# Patient Record
Sex: Male | Born: 1935 | Race: White | Hispanic: No | Marital: Married | Smoking: Former smoker
Health system: Southern US, Community
[De-identification: ages and names within clinical notes are randomized; demographics above are authoritative.]

## PROBLEM LIST (undated history)

## (undated) DIAGNOSIS — Z96641 Presence of right artificial hip joint: Secondary | ICD-10-CM

## (undated) DIAGNOSIS — K5790 Diverticulosis of intestine, part unspecified, without perforation or abscess without bleeding: Secondary | ICD-10-CM

## (undated) DIAGNOSIS — Z87442 Personal history of urinary calculi: Secondary | ICD-10-CM

## (undated) DIAGNOSIS — F32A Depression, unspecified: Secondary | ICD-10-CM

## (undated) DIAGNOSIS — E785 Hyperlipidemia, unspecified: Secondary | ICD-10-CM

## (undated) DIAGNOSIS — K635 Polyp of colon: Secondary | ICD-10-CM

## (undated) DIAGNOSIS — M1990 Unspecified osteoarthritis, unspecified site: Secondary | ICD-10-CM

## (undated) DIAGNOSIS — I10 Essential (primary) hypertension: Secondary | ICD-10-CM

## (undated) DIAGNOSIS — I2510 Atherosclerotic heart disease of native coronary artery without angina pectoris: Secondary | ICD-10-CM

## (undated) DIAGNOSIS — Z8249 Family history of ischemic heart disease and other diseases of the circulatory system: Secondary | ICD-10-CM

## (undated) DIAGNOSIS — C801 Malignant (primary) neoplasm, unspecified: Secondary | ICD-10-CM

## (undated) DIAGNOSIS — G4733 Obstructive sleep apnea (adult) (pediatric): Secondary | ICD-10-CM

## (undated) DIAGNOSIS — F329 Major depressive disorder, single episode, unspecified: Secondary | ICD-10-CM

## (undated) DIAGNOSIS — R7303 Prediabetes: Secondary | ICD-10-CM

## (undated) DIAGNOSIS — IMO0002 Reserved for concepts with insufficient information to code with codable children: Secondary | ICD-10-CM

## (undated) DIAGNOSIS — I499 Cardiac arrhythmia, unspecified: Secondary | ICD-10-CM

## (undated) DIAGNOSIS — I4891 Unspecified atrial fibrillation: Secondary | ICD-10-CM

## (undated) DIAGNOSIS — S301XXA Contusion of abdominal wall, initial encounter: Secondary | ICD-10-CM

## (undated) HISTORY — DX: Major depressive disorder, single episode, unspecified: F32.9

## (undated) HISTORY — DX: Reserved for concepts with insufficient information to code with codable children: IMO0002

## (undated) HISTORY — PX: OTHER SURGICAL HISTORY: SHX169

## (undated) HISTORY — DX: Contusion of abdominal wall, initial encounter: S30.1XXA

## (undated) HISTORY — DX: Essential (primary) hypertension: I10

## (undated) HISTORY — DX: Depression, unspecified: F32.A

## (undated) HISTORY — PX: HERNIA REPAIR: SHX51

## (undated) HISTORY — DX: Presence of right artificial hip joint: Z96.641

## (undated) HISTORY — DX: Obstructive sleep apnea (adult) (pediatric): G47.33

## (undated) HISTORY — DX: Unspecified atrial fibrillation: I48.91

## (undated) HISTORY — DX: Hyperlipidemia, unspecified: E78.5

## (undated) HISTORY — DX: Diverticulosis of intestine, part unspecified, without perforation or abscess without bleeding: K57.90

## (undated) HISTORY — DX: Polyp of colon: K63.5

## (undated) HISTORY — DX: Prediabetes: R73.03

## (undated) HISTORY — DX: Atherosclerotic heart disease of native coronary artery without angina pectoris: I25.10

---
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
## 1978-10-15 HISTORY — PX: TRANSURETHRAL RESECTION OF PROSTATE: SHX73

## 1998-02-15 ENCOUNTER — Other Ambulatory Visit: Admission: RE | Admit: 1998-02-15 | Discharge: 1998-02-15 | Payer: Self-pay | Admitting: Internal Medicine

## 1998-05-03 ENCOUNTER — Other Ambulatory Visit: Admission: RE | Admit: 1998-05-03 | Discharge: 1998-05-03 | Payer: Self-pay | Admitting: Internal Medicine

## 2000-12-25 ENCOUNTER — Encounter: Payer: Self-pay | Admitting: Internal Medicine

## 2000-12-25 ENCOUNTER — Ambulatory Visit (HOSPITAL_COMMUNITY): Admission: RE | Admit: 2000-12-25 | Discharge: 2000-12-25 | Payer: Self-pay | Admitting: Internal Medicine

## 2002-05-12 ENCOUNTER — Ambulatory Visit (HOSPITAL_COMMUNITY): Admission: RE | Admit: 2002-05-12 | Discharge: 2002-05-12 | Payer: Self-pay | Admitting: Cardiovascular Disease

## 2005-03-01 ENCOUNTER — Ambulatory Visit (HOSPITAL_COMMUNITY): Admission: RE | Admit: 2005-03-01 | Discharge: 2005-03-01 | Payer: Self-pay | Admitting: Internal Medicine

## 2005-12-06 ENCOUNTER — Ambulatory Visit (HOSPITAL_COMMUNITY): Admission: RE | Admit: 2005-12-06 | Discharge: 2005-12-06 | Payer: Self-pay | Admitting: Internal Medicine

## 2006-03-05 ENCOUNTER — Encounter: Admission: RE | Admit: 2006-03-05 | Discharge: 2006-03-05 | Payer: Self-pay | Admitting: Internal Medicine

## 2006-12-13 ENCOUNTER — Ambulatory Visit: Payer: Self-pay

## 2009-02-21 ENCOUNTER — Ambulatory Visit (HOSPITAL_COMMUNITY): Admission: RE | Admit: 2009-02-21 | Discharge: 2009-02-21 | Payer: Self-pay | Admitting: Internal Medicine

## 2011-08-30 ENCOUNTER — Other Ambulatory Visit (HOSPITAL_COMMUNITY): Payer: Self-pay | Admitting: Internal Medicine

## 2011-08-30 ENCOUNTER — Ambulatory Visit (HOSPITAL_COMMUNITY)
Admission: RE | Admit: 2011-08-30 | Discharge: 2011-08-30 | Disposition: A | Discharge status: Home / Self Care | Payer: Medicare Other | Source: Ambulatory Visit | Attending: Internal Medicine | Admitting: Internal Medicine

## 2011-08-30 DIAGNOSIS — R05 Cough: Secondary | ICD-10-CM | POA: Insufficient documentation

## 2011-08-30 DIAGNOSIS — R059 Cough, unspecified: Secondary | ICD-10-CM

## 2011-08-30 DIAGNOSIS — Z0000 Encounter for general adult medical examination without abnormal findings: Secondary | ICD-10-CM | POA: Insufficient documentation

## 2012-06-30 ENCOUNTER — Ambulatory Visit (INDEPENDENT_AMBULATORY_CARE_PROVIDER_SITE_OTHER): Payer: Medicare Other | Admitting: Cardiovascular Disease

## 2012-06-30 ENCOUNTER — Encounter: Payer: Self-pay | Admitting: Cardiovascular Disease

## 2012-06-30 ENCOUNTER — Encounter: Payer: Self-pay | Admitting: *Deleted

## 2012-06-30 VITALS — BP 144/72 | HR 50 | Ht 70.0 in | Wt 184.0 lb

## 2012-06-30 DIAGNOSIS — IMO0002 Reserved for concepts with insufficient information to code with codable children: Secondary | ICD-10-CM

## 2012-06-30 DIAGNOSIS — E785 Hyperlipidemia, unspecified: Secondary | ICD-10-CM

## 2012-06-30 DIAGNOSIS — R7309 Other abnormal glucose: Secondary | ICD-10-CM | POA: Insufficient documentation

## 2012-06-30 DIAGNOSIS — G4733 Obstructive sleep apnea (adult) (pediatric): Secondary | ICD-10-CM | POA: Insufficient documentation

## 2012-06-30 DIAGNOSIS — E782 Mixed hyperlipidemia: Secondary | ICD-10-CM | POA: Insufficient documentation

## 2012-06-30 DIAGNOSIS — I4891 Unspecified atrial fibrillation: Secondary | ICD-10-CM

## 2012-06-30 DIAGNOSIS — Z7901 Long term (current) use of anticoagulants: Secondary | ICD-10-CM

## 2012-06-30 DIAGNOSIS — I10 Essential (primary) hypertension: Secondary | ICD-10-CM | POA: Insufficient documentation

## 2012-06-30 NOTE — Assessment & Plan Note (Signed)
Followed by primary  No bleeding issues  Discussed alternative drugs like pradaxa, eliquis and xarelto

## 2012-06-30 NOTE — Assessment & Plan Note (Signed)
Cholesterol is at goal.  Continue current dose of statin and diet Rx.  No myalgias or side effects.  F/U  LFT's in 6 months. No results found for this basename: LDLCALC             

## 2012-06-30 NOTE — Progress Notes (Signed)
Patient ID: [name] Yu, male   DOB: [birth year], 78 y.o.   MRN: [id number] 78 yo referred by primary to establish with cards.  Apparantly has been seen at [facility] most recently.  Multiple previous episodes of chest pain not thought to be cardiac.  Had normal cath in 2003.  Normal stress test in 2005 at [facility].  07/2006 had Afib w/u at [facility].  DCC and on cardizem Dose decreased due to bradycardia Another normal stress test in 2007.  Has HTN, elevated lipids and sleep apnea.  Cardizem recently D/C by Dr Valetta Fuller for bradycardia  Reviewed over 50 pages of records from [facility] and records from primary office   ROS: Denies fever, malais, weight loss, blurry vision, decreased visual acuity, cough, sputum, SOB, hemoptysis, pleuritic pain, palpitaitons, heartburn, abdominal pain, melena, lower extremity edema, claudication, or rash.  All other systems reviewed and negative   General: Affect appropriate Healthy:  appears stated age HEENT: normal Neck supple with no adenopathy JVP normal no bruits no thyromegaly Lungs clear with no wheezing and good diaphragmatic motion Heart:  S1/S2 no murmur,rub, gallop or click PMI normal Abdomen: benighn, BS positve, no tenderness, no AAA no bruit.  No HSM or HJR Distal pulses intact with no bruits No edema Neuro non-focal Skin warm and dry No muscular weakness  Medications Current Outpatient Prescriptions  Medication Sig Dispense Refill  . aspirin 81 MG tablet Take 81 mg by mouth daily.      . Calcium Carbonate Antacid (TUMS E-X PO) Take 2 tablets by mouth daily.      . Cholecalciferol 5000 UNITS TABS Take 1 tablet by mouth daily.      . citalopram (CELEXA) 20 MG tablet Take 20 mg by mouth daily.      Marland Kitchen diltiazem (CARDIZEM) 60 MG tablet Take 60 mg by mouth as needed.      . finasteride (PROSCAR) 5 MG tablet 1/2 tab po qd      . Magnesium 200 MG TABS Take 1 tablet by mouth daily.      . Multiple Vitamin (MULTIVITAMIN) capsule Take 1 capsule  by mouth daily.      . Omega-3 Fatty Acids (FISH OIL PO) Take 1 tablet by mouth daily.      . simvastatin (ZOCOR) 40 MG tablet Take 20 mg by mouth Daily.       . vitamin C (ASCORBIC ACID) 500 MG tablet Take 1,000 mg by mouth daily.      Marland Kitchen warfarin (COUMADIN) 5 MG tablet As directed      . zolpidem (AMBIEN) 10 MG tablet Take 1 tablet by mouth At bedtime.        Allergies Cardizem; Ciprofloxacin; and Novocain  Family History: Family History  Problem Relation Age of Onset  . Diabetes    . Alzheimer's disease    . Cancer    . Stroke    . Dementia      Social History: History   Social History  . Marital Status: Married    Spouse Name: N/A    Number of Children: N/A  . Years of Education: N/A   Occupational History  . Not on file.   Social History Main Topics  . Smoking status: Never Smoker   . Smokeless tobacco: Not on file  . Alcohol Use: No  . Drug Use: No  . Sexually Active: Not on file   Other Topics Concern  . Not on file   Social History Narrative  .  No narrative on file    Electrocardiogram:   NSR rate 50  LAD nonspecifc ST/T wave changes no change from ECG at [facility] 2008  Assessment and Plan

## 2012-06-30 NOTE — Patient Instructions (Signed)
Your physician wants you to follow-up in:   6 MONTHS WITH DR Mariah Milling IN [facility] OFFICE You will receive a reminder letter in the mail two months in advance. If you don't receive a letter please call our office to schedule the follow-up appointment. Your physician recommends that you continue on your current medications as directed. Please refer to the Current Medication list given to you today.

## 2012-06-30 NOTE — Assessment & Plan Note (Signed)
Avoid AV nodal blocking drugs.   Well controlled.  Continue current medications and low sodium Dash type diet.

## 2012-06-30 NOTE — Assessment & Plan Note (Signed)
Clearly has SSS  Discussed coumadin alternatives.  He is happy with coumadin for the time being.  Discussed possible future need for pacer and need for avoiding AV nodal blocking drugs.  Intervals on ECG otherwise ok with no AV block

## 2012-07-03 ENCOUNTER — Encounter: Payer: Self-pay | Admitting: Cardiovascular Disease

## 2012-11-29 ENCOUNTER — Other Ambulatory Visit: Payer: Self-pay

## 2012-12-05 ENCOUNTER — Encounter: Payer: Self-pay | Admitting: Gastroenterology

## 2012-12-29 ENCOUNTER — Ambulatory Visit (INDEPENDENT_AMBULATORY_CARE_PROVIDER_SITE_OTHER): Payer: Medicare Other | Admitting: Gastroenterology

## 2012-12-29 ENCOUNTER — Encounter: Payer: Self-pay | Admitting: Gastroenterology

## 2012-12-29 VITALS — BP 128/76 | HR 68 | Ht 70.0 in | Wt 189.0 lb

## 2012-12-29 DIAGNOSIS — Z860100 Personal history of colon polyps, unspecified: Secondary | ICD-10-CM | POA: Insufficient documentation

## 2012-12-29 NOTE — Assessment & Plan Note (Signed)
Risks and benefits of colonoscopy were discussed with the patient. I explained that we normally do not routinely perform screening colonoscopy in patients over 77 years of age. The patient's overall health status is excellent. I discussed options including no colonoscopy, colonoscopy while on Coumadin, and colonoscopy while holding Coumadin. The slightly higher risk for stroke while holding Coumadin was also discussed. The patient was somewhat indecisive. I suggested that he talk to his primary care physician to help him make a decision.

## 2012-12-29 NOTE — Progress Notes (Signed)
History of Present Illness: Pleasant 79 year old white male with history of adenomatous colon polyps, atrial fibrillation, on Coumadin, referred for consideration of colonoscopy. In 2003 several adenomatous and hyperplastic polyps were removed. The largest polyp measured 8 mm. He has no GI complaints including change of bowel habits, abdominal pain, melena or hematochezia. Because of a history of transient atrial fibrillation he was placed on Coumadin years ago.    Past Medical History  Diagnosis Date  . Hypertension   . Pre-diabetes   . Depression   . Dyslipidemia   . OSA (obstructive sleep apnea)   . Atrial fib/flutter, transient   . Colon polyps     2003 hyperplastic and tubuler adenoma  . Diverticulosis    Past Surgical History  Procedure Laterality Date  . Hernia repair     family history includes Alzheimer's disease in his mother; Breast cancer in his daughter and sister; Diabetes in his brother; Heart disease in his brother and sister; Kidney cancer in his father; Rectal cancer in his sister; and Ulcerative colitis in his sister. Current Outpatient Prescriptions  Medication Sig Dispense Refill  . aspirin 81 MG tablet Take 81 mg by mouth daily.      . Calcium Carbonate Antacid (TUMS E-X PO) Take 2 tablets by mouth daily.      . Cholecalciferol 5000 UNITS TABS Take 1 tablet by mouth daily.      . citalopram (CELEXA) 20 MG tablet Take 20 mg by mouth daily.      Marland Kitchen diltiazem (CARDIZEM) 60 MG tablet Take 60 mg by mouth as needed.      . finasteride (PROSCAR) 5 MG tablet 1/2 tab po qd      . Magnesium 200 MG TABS Take 1 tablet by mouth daily.      . Multiple Vitamin (MULTIVITAMIN) capsule Take 1 capsule by mouth daily.      . Omega-3 Fatty Acids (FISH OIL PO) Take 1 tablet by mouth daily.      . simvastatin (ZOCOR) 40 MG tablet Take 20 mg by mouth Daily.       . vitamin C (ASCORBIC ACID) 500 MG tablet Take 1,000 mg by mouth daily.      Marland Kitchen warfarin (COUMADIN) 5 MG tablet As directed       . zolpidem (AMBIEN) 10 MG tablet Take 1 tablet by mouth At bedtime.       No current facility-administered medications for this visit.   Allergies as of 12/29/2012 - Review Complete 12/29/2012  Allergen Reaction Noted  . Cardizem (diltiazem hcl)  06/30/2012  . Ciprofloxacin  06/30/2012  . Novocain (procaine hcl)  06/30/2012    reports that he has never smoked. He has never used smokeless tobacco. He reports that he does not drink alcohol or use illicit drugs.     Review of Systems: He has easy bruisability Pertinent positive and negative review of systems were noted in the above HPI section. All other review of systems were otherwise negative.  Vital signs were reviewed in today's medical record Physical Exam: General: Well developed , well nourished, no acute distress Skin: anicteric Head: Normocephalic and atraumatic Eyes:  sclerae anicteric, EOMI Ears: Normal auditory acuity Mouth: No deformity or lesions Neck: Supple, no masses or thyromegaly Lungs: Clear throughout to auscultation Heart: Regular rate and rhythm; no murmurs, rubs or bruits Abdomen: Soft, non tender and non distended. No masses, hepatosplenomegaly or hernias noted. Normal Bowel sounds Rectal:deferred Musculoskeletal: Symmetrical with no gross deformities  Skin: No lesions on  visible extremities Pulses:  Normal pulses noted Extremities: No clubbing, cyanosis, edema or deformities noted Neurological: Alert oriented x 4, grossly nonfocal Cervical Nodes:  No significant cervical adenopathy Inguinal Nodes: No significant inguinal adenopathy Psychological:  Alert and cooperative. Normal mood and affect

## 2012-12-29 NOTE — Patient Instructions (Addendum)
Follow up as needed

## 2013-01-08 ENCOUNTER — Encounter: Payer: Self-pay | Admitting: Gastroenterology

## 2013-02-10 ENCOUNTER — Encounter: Payer: Self-pay | Admitting: Cardiovascular Disease

## 2013-02-10 ENCOUNTER — Ambulatory Visit (INDEPENDENT_AMBULATORY_CARE_PROVIDER_SITE_OTHER): Payer: Medicare Other | Admitting: Cardiovascular Disease

## 2013-02-10 VITALS — BP 110/74 | HR 57 | Ht 71.0 in | Wt 187.5 lb

## 2013-02-10 DIAGNOSIS — E785 Hyperlipidemia, unspecified: Secondary | ICD-10-CM

## 2013-02-10 DIAGNOSIS — I4891 Unspecified atrial fibrillation: Secondary | ICD-10-CM

## 2013-02-10 DIAGNOSIS — I10 Essential (primary) hypertension: Secondary | ICD-10-CM

## 2013-02-10 NOTE — Assessment & Plan Note (Signed)
No recent episodes of atrial fibrillation. No symptoms concerning for arrhythmia. We will continue to take diltiazem when necessary as he has bradycardia when taking rate control medications on a regular basis.

## 2013-02-10 NOTE — Progress Notes (Signed)
Patient ID: [name] Yu, male    DOB: 20-Jul-1927, 79 y.o.   MRN: [id number]  HPI Comments: 79 yo gentleman with no known coronary artery disease, previously followed at [facility], history of HTN, elevated lipids and sleep apnea, bradycardia on calcium channel blockers and beta blockers, presents to establish care in the [place] office. Previously followed at the [facility]. History of atrial fibrillation in the past    Multiple previous episodes of chest pain not thought to be cardiac.  Had normal cath in 2003.  Normal stress test in 2005 at [facility].  07/2006 had Afib w/u at [facility].  DCC and on cardizem Dose decreased due to bradycardia Another normal stress test in 2007.  Some of his old records were reviewed, discussed with him.  He reports that overall he is doing well. He is active, balance is good. Reports good leg strength. No recent chest pain, shortness of breath.  EKG shows normal sinus rhythm with rate 57 beats per minute, T wave abnormality in V3 to V6, left axis deviation/left anterior fascicular block    Outpatient Encounter Prescriptions as of 02/10/2013  Medication Sig Dispense Refill  . aspirin 81 MG tablet Take 81 mg by mouth daily.      . Calcium Carbonate Antacid (TUMS E-X PO) Take 2 tablets by mouth daily.      . Cholecalciferol 5000 UNITS TABS Take 1 tablet by mouth daily.      . citalopram (CELEXA) 20 MG tablet Take 20 mg by mouth daily.      Marland Kitchen diltiazem (CARDIZEM) 60 MG tablet Take 60 mg by mouth as needed.      . finasteride (PROSCAR) 5 MG tablet 1/2 tab po qd      . LORazepam (ATIVAN) 2 MG tablet Take 2 mg by mouth as needed.       . Magnesium 200 MG TABS Take 1 tablet by mouth daily.      . Multiple Vitamin (MULTIVITAMIN) capsule Take 1 capsule by mouth daily.      . Omega-3 Fatty Acids (FISH OIL PO) Take 1 tablet by mouth daily.      . simvastatin (ZOCOR) 40 MG tablet Take 20 mg by mouth Daily.       . vitamin C (ASCORBIC ACID) 500 MG tablet Take 1,000 mg  by mouth daily.      Marland Kitchen warfarin (COUMADIN) 5 MG tablet As directed      . [DISCONTINUED] zolpidem (AMBIEN) 10 MG tablet Take 1 tablet by mouth At bedtime.       No facility-administered encounter medications on file as of 02/10/2013.     Review of Systems  Constitutional: Negative.   HENT: Negative.   Eyes: Negative.   Respiratory: Negative.   Cardiovascular: Negative.   Gastrointestinal: Negative.   Musculoskeletal: Negative.   Skin: Negative.   Neurological: Negative.   Psychiatric/Behavioral: Negative.   All other systems reviewed and are negative.    BP 110/74  Pulse 57  Ht 5\' 11"  (1.803 m)  Wt 187 lb 8 oz (85.049 kg)  BMI 26.16 kg/m2  Physical Exam  Nursing note and vitals reviewed. Constitutional: He is oriented to person, place, and time. He appears well-developed and well-nourished.  HENT:  Head: Normocephalic.  Nose: Nose normal.  Mouth/Throat: Oropharynx is clear and moist.  Eyes: Conjunctivae are normal. Pupils are equal, round, and reactive to light.  Neck: Normal range of motion. Neck supple. No JVD present.  Cardiovascular: Normal rate, regular rhythm,  S1 normal, S2 normal, normal heart sounds and intact distal pulses.  Exam reveals no gallop and no friction rub.   No murmur heard. Pulmonary/Chest: Effort normal and breath sounds normal. No respiratory distress. He has no wheezes. He has no rales. He exhibits no tenderness.  Abdominal: Soft. Bowel sounds are normal. He exhibits no distension. There is no tenderness.  Musculoskeletal: Normal range of motion. He exhibits no edema and no tenderness.  Lymphadenopathy:    He has no cervical adenopathy.  Neurological: He is alert and oriented to person, place, and time. Coordination normal.  Skin: Skin is warm and dry. No rash noted. No erythema.  Psychiatric: He has a normal mood and affect. His behavior is normal. Judgment and thought content normal.      Assessment and Plan

## 2013-02-10 NOTE — Assessment & Plan Note (Signed)
Cholesterol is at goal on the current lipid regimen. No changes to the medications were made.  

## 2013-02-10 NOTE — Assessment & Plan Note (Signed)
Blood pressure is well controlled on today's visit. No changes made to the medications. 

## 2013-02-10 NOTE — Patient Instructions (Addendum)
You are doing well. No medication changes were made.  Please call us if you have new issues that need to be addressed before your next appt.  Your physician wants you to follow-up in: 12 months.  You will receive a reminder letter in the mail two months in advance. If you don't receive a letter, please call our office to schedule the follow-up appointment. 

## 2013-04-28 ENCOUNTER — Other Ambulatory Visit (HOSPITAL_COMMUNITY): Payer: Self-pay | Admitting: Physician Assistant

## 2013-04-28 ENCOUNTER — Ambulatory Visit (HOSPITAL_COMMUNITY)
Admission: RE | Admit: 2013-04-28 | Discharge: 2013-04-28 | Disposition: A | Discharge status: Home / Self Care | Payer: Medicare Other | Source: Ambulatory Visit | Attending: Physician Assistant | Admitting: Physician Assistant

## 2013-04-28 DIAGNOSIS — J8410 Pulmonary fibrosis, unspecified: Secondary | ICD-10-CM | POA: Insufficient documentation

## 2013-04-28 DIAGNOSIS — Q254 Congenital malformation of aorta unspecified: Secondary | ICD-10-CM | POA: Insufficient documentation

## 2013-04-28 DIAGNOSIS — R0989 Other specified symptoms and signs involving the circulatory and respiratory systems: Secondary | ICD-10-CM | POA: Insufficient documentation

## 2013-04-28 DIAGNOSIS — R05 Cough: Secondary | ICD-10-CM | POA: Insufficient documentation

## 2013-04-28 DIAGNOSIS — R062 Wheezing: Secondary | ICD-10-CM | POA: Insufficient documentation

## 2013-04-28 DIAGNOSIS — J449 Chronic obstructive pulmonary disease, unspecified: Secondary | ICD-10-CM

## 2013-04-28 DIAGNOSIS — R059 Cough, unspecified: Secondary | ICD-10-CM | POA: Insufficient documentation

## 2013-04-28 DIAGNOSIS — R509 Fever, unspecified: Secondary | ICD-10-CM | POA: Insufficient documentation

## 2013-05-20 ENCOUNTER — Other Ambulatory Visit: Payer: Self-pay

## 2013-08-18 ENCOUNTER — Telehealth: Payer: Self-pay | Admitting: Physician Assistant

## 2013-08-20 ENCOUNTER — Other Ambulatory Visit: Payer: Self-pay

## 2013-09-01 ENCOUNTER — Encounter: Payer: Self-pay | Admitting: Physician Assistant

## 2013-09-01 ENCOUNTER — Ambulatory Visit: Payer: Medicare Other | Admitting: Physician Assistant

## 2013-09-01 VITALS — BP 112/62 | HR 76 | Temp 98.1°F | Resp 16 | Wt 191.0 lb

## 2013-09-01 DIAGNOSIS — I10 Essential (primary) hypertension: Secondary | ICD-10-CM

## 2013-09-01 DIAGNOSIS — E559 Vitamin D deficiency, unspecified: Secondary | ICD-10-CM

## 2013-09-01 DIAGNOSIS — E785 Hyperlipidemia, unspecified: Secondary | ICD-10-CM

## 2013-09-01 DIAGNOSIS — I4891 Unspecified atrial fibrillation: Secondary | ICD-10-CM

## 2013-09-01 DIAGNOSIS — R7303 Prediabetes: Secondary | ICD-10-CM

## 2013-09-01 LAB — CBC WITH DIFFERENTIAL/PLATELET
HCT: 46.6 % (ref 39.0–52.0)
Hemoglobin: 16.5 g/dL (ref 13.0–17.0)
Lymphocytes Relative: 29 % (ref 12–46)
MCHC: 35.4 g/dL (ref 30.0–36.0)
Monocytes Absolute: 0.7 10*3/uL (ref 0.1–1.0)
Monocytes Relative: 9 % (ref 3–12)
Neutro Abs: 4.2 10*3/uL (ref 1.7–7.7)
WBC: 7.1 10*3/uL (ref 4.0–10.5)

## 2013-09-01 LAB — HEMOGLOBIN A1C: Mean Plasma Glucose: 114 mg/dL (ref <117)

## 2013-09-01 NOTE — Patient Instructions (Signed)
Fat and Cholesterol Control Diet  Fat and cholesterol levels in your blood and organs are influenced by your diet. High levels of fat and cholesterol may lead to diseases of the heart, small and large blood vessels, gallbladder, liver, and pancreas.  CONTROLLING FAT AND CHOLESTEROL WITH DIET  Although exercise and lifestyle factors are important, your diet is key. That is because certain foods are known to raise cholesterol and others to lower it. The goal is to balance foods for their effect on cholesterol and more importantly, to replace saturated and trans fat with other types of fat, such as monounsaturated fat, polyunsaturated fat, and omega-3 fatty acids.  On average, a person should consume no more than 15 to 17 g of saturated fat daily. Saturated and trans fats are considered "bad" fats, and they will raise LDL cholesterol. Saturated fats are primarily found in animal products such as meats, butter, and cream. However, that does not mean you need to give up all your favorite foods. Today, there are good tasting, low-fat, low-cholesterol substitutes for most of the things you like to eat. Choose low-fat or nonfat alternatives. Choose round or loin cuts of red meat. These types of cuts are lowest in fat and cholesterol. Chicken (without the skin), fish, veal, and ground turkey breast are great choices. Eliminate fatty meats, such as hot dogs and salami. Even shellfish have little or no saturated fat. Have a 3 oz (85 g) portion when you eat lean meat, poultry, or fish.  Trans fats are also called "partially hydrogenated oils." They are oils that have been scientifically manipulated so that they are solid at room temperature resulting in a longer shelf life and improved taste and texture of foods in which they are added. Trans fats are found in stick margarine, some tub margarines, cookies, crackers, and baked goods.   When baking and cooking, oils are a great substitute for butter. The monounsaturated oils are  especially beneficial since it is believed they lower LDL and raise HDL. The oils you should avoid entirely are saturated tropical oils, such as coconut and palm.   Remember to eat a lot from food groups that are naturally free of saturated and trans fat, including fish, fruit, vegetables, beans, grains (barley, rice, couscous, bulgur wheat), and pasta (without cream sauces).   IDENTIFYING FOODS THAT LOWER FAT AND CHOLESTEROL   Soluble fiber may lower your cholesterol. This type of fiber is found in fruits such as apples, vegetables such as broccoli, potatoes, and carrots, legumes such as beans, peas, and lentils, and grains such as barley. Foods fortified with plant sterols (phytosterol) may also lower cholesterol. You should eat at least 2 g per day of these foods for a cholesterol lowering effect.   Read package labels to identify low-saturated fats, trans fat free, and low-fat foods at the supermarket. Select cheeses that have only 2 to 3 g saturated fat per ounce. Use a heart-healthy tub margarine that is free of trans fats or partially hydrogenated oil. When buying baked goods (cookies, crackers), avoid partially hydrogenated oils. Breads and muffins should be made from whole grains (whole-wheat or whole oat flour, instead of "flour" or "enriched flour"). Buy non-creamy canned soups with reduced salt and no added fats.   FOOD PREPARATION TECHNIQUES   Never deep-fry. If you must fry, either stir-fry, which uses very little fat, or use non-stick cooking sprays. When possible, broil, bake, or roast meats, and steam vegetables. Instead of putting butter or margarine on vegetables, use lemon   and herbs, applesauce, and cinnamon (for squash and sweet potatoes). Use nonfat yogurt, salsa, and low-fat dressings for salads.   LOW-SATURATED FAT / LOW-FAT FOOD SUBSTITUTES  Meats / Saturated Fat (g)  · Avoid: Steak, marbled (3 oz/85 g) / 11 g  · Choose: Steak, lean (3 oz/85 g) / 4 g  · Avoid: Hamburger (3 oz/85 g) / 7  g  · Choose: Hamburger, lean (3 oz/85 g) / 5 g  · Avoid: Ham (3 oz/85 g) / 6 g  · Choose: Ham, lean cut (3 oz/85 g) / 2.4 g  · Avoid: Chicken, with skin, dark meat (3 oz/85 g) / 4 g  · Choose: Chicken, skin removed, dark meat (3 oz/85 g) / 2 g  · Avoid: Chicken, with skin, light meat (3 oz/85 g) / 2.5 g  · Choose: Chicken, skin removed, light meat (3 oz/85 g) / 1 g  Dairy / Saturated Fat (g)  · Avoid: Whole milk (1 cup) / 5 g  · Choose: Low-fat milk, 2% (1 cup) / 3 g  · Choose: Low-fat milk, 1% (1 cup) / 1.5 g  · Choose: Skim milk (1 cup) / 0.3 g  · Avoid: Hard cheese (1 oz/28 g) / 6 g  · Choose: Skim milk cheese (1 oz/28 g) / 2 to 3 g  · Avoid: Cottage cheese, 4% fat (1 cup) / 6.5 g  · Choose: Low-fat cottage cheese, 1% fat (1 cup) / 1.5 g  · Avoid: Ice cream (1 cup) / 9 g  · Choose: Sherbet (1 cup) / 2.5 g  · Choose: Nonfat frozen yogurt (1 cup) / 0.3 g  · Choose: Frozen fruit bar / trace  · Avoid: Whipped cream (1 tbs) / 3.5 g  · Choose: Nondairy whipped topping (1 tbs) / 1 g  Condiments / Saturated Fat (g)  · Avoid: Mayonnaise (1 tbs) / 2 g  · Choose: Low-fat mayonnaise (1 tbs) / 1 g  · Avoid: Butter (1 tbs) / 7 g  · Choose: Extra light margarine (1 tbs) / 1 g  · Avoid: Coconut oil (1 tbs) / 11.8 g  · Choose: Olive oil (1 tbs) / 1.8 g  · Choose: Corn oil (1 tbs) / 1.7 g  · Choose: Safflower oil (1 tbs) / 1.2 g  · Choose: Sunflower oil (1 tbs) / 1.4 g  · Choose: Soybean oil (1 tbs) / 2.4 g  · Choose: Canola oil (1 tbs) / 1 g  Document Released: 10/01/2005 Document Revised: 01/26/2013 Document Reviewed: 03/22/2011  [employer]® Patient Information ©2014 [employer].

## 2013-09-01 NOTE — Progress Notes (Signed)
HPI Patient presents for 3 month follow up with hypertension, hyperlipidemia, prediabetes and vitamin D. Patient's blood pressure has been controlled at home. Patient denies chest pain, shortness of breath, dizziness.  Patient's cholesterol is diet controlled. In addition they are on zocor  and denies myalgias. The cholesterol last visit was HDL 53, trigs 157 . The patient has been working on diet and exercise, patient walks outside and repairs house, for prediabetes, and denies changes in vision, polys, and paresthesias.  A1C 5.8 Patient is on Vitamin D supplement. Level 67 Patient is also on Coumadin for afib, last visit is was 1.3 so the patient was told to do Coumadin 5 mg 3 days a week and 2.5 mg 4 days a week.  Current Medications:  Current Outpatient Prescriptions on File Prior to Visit  Medication Sig Dispense Refill  . aspirin 81 MG tablet Take 81 mg by mouth daily.      . Calcium Carbonate Antacid (TUMS E-X PO) Take 2 tablets by mouth daily.      . Cholecalciferol 5000 UNITS TABS Take 1 tablet by mouth daily.      . citalopram (CELEXA) 20 MG tablet Take 20 mg by mouth daily.      Marland Kitchen diltiazem (CARDIZEM) 60 MG tablet Take 60 mg by mouth as needed.      . finasteride (PROSCAR) 5 MG tablet 1/2 tab po qd      . LORazepam (ATIVAN) 2 MG tablet Take 2 mg by mouth as needed.       . Magnesium 200 MG TABS Take 1 tablet by mouth daily.      . Multiple Vitamin (MULTIVITAMIN) capsule Take 1 capsule by mouth daily.      . Omega-3 Fatty Acids (FISH OIL PO) Take 1 tablet by mouth daily.      . simvastatin (ZOCOR) 40 MG tablet Take 20 mg by mouth Daily.       . vitamin C (ASCORBIC ACID) 500 MG tablet Take 1,000 mg by mouth daily.      Marland Kitchen warfarin (COUMADIN) 5 MG tablet As directed       No current facility-administered medications on file prior to visit.   Medical History:  Past Medical History  Diagnosis Date  . Hypertension   . Pre-diabetes   . Depression   . Dyslipidemia   . OSA  (obstructive sleep apnea)   . Atrial fib/flutter, transient   . Colon polyps     2003 hyperplastic and tubuler adenoma  . Diverticulosis    Allergies:  Allergies  Allergen Reactions  . Cardizem [Diltiazem Hcl]     High-dose (braydycardia0  . Ciprofloxacin     dysphoria  . Novocain [Procaine Hcl]     ROS Constitutional: Denies fever, chills, weight loss/gain, headaches, insomnia, fatigue, night sweats, and change in appetite. Eyes: Denies redness, blurred vision, diplopia, discharge, itchy, watery eyes.  ENT: Denies discharge, congestion, post nasal drip, sore throat, earache, dental pain, Tinnitus, Vertigo, Sinus pain, snoring.  Cardio: Denies chest pain, palpitations, irregular heartbeat,  dyspnea, diaphoresis, orthopnea, PND, claudication, edema Respiratory: denies cough, dyspnea,pleurisy, hoarseness, wheezing.  Gastrointestinal: Denies dysphagia, heartburn,  water brash, pain, cramps, nausea, vomiting, bloating, diarrhea, constipation, hematemesis, melena, hematochezia,  hemorrhoids Genitourinary: Denies dysuria, frequency, urgency, nocturia, hesitancy, discharge, hematuria, flank pain Musculoskeletal: Denies arthralgia, myalgia, stiffness, Jt. Swelling, pain, limp, and strain/sprain. Skin: Denies pruritis, rash, hives, warts, acne, eczema, changing in skin lesion Neuro: Weakness, tremor, incoordination, spasms, paresthesia, pain Psychiatric: Denies confusion, memory loss, sensory loss  Endocrine: Denies change in weight, skin, hair change, nocturia, and paresthesia, Diabetic Polys, visual blurring, hyper /hypo glycemic episodes.  Heme/Lymph: Excessive bleeding, bruising, enlarged lymph nodes  Family history- Review and unchanged Social history- Review and unchanged Physical Exam: Filed Vitals:   09/01/13 1513  BP: 112/62  Pulse: 76  Temp: 98.1 F (36.7 C)  Resp: 16   Filed Weights   09/01/13 1513  Weight: 191 lb (86.637 kg)   General Appearance: Well nourished, in no  apparent distress. Eyes: PERRLA, EOMs, conjunctiva no swelling or erythema, normal fundi and vessels. Sinuses: No Frontal/maxillary tenderness ENT/Mouth: Ext aud canals clear, with TMs without erythema, bulging.No erythema, swelling, or exudate on post pharynx.  Tonsils not swollen or erythematous. Hearing normal.  Neck: Supple, thyroid normal.  Respiratory: Respiratory effort normal, BS equal bilaterally without rales, rhonchi, wheezing or stridor.  Cardio: Heart sounds normal, regular rate and rhythm without murmurs, rubs or gallops. Peripheral pulses brisk and equal bilaterally, without edema.  Abdomen: Flat, soft, with bowel sounds. Non tender, no guarding, rebound, hernias, masses, or organomegaly.  Lymphatics: Non tender without lymphadenopathy.  Musculoskeletal: Full ROM all peripheral extremities, joint stability, 5/5 strength, and normal gait. Skin: Warm, dry without rashes, lesions, ecchymosis.  Neuro: Cranial nerves intact, reflexes equal bilaterally. Normal muscle tone, no cerebellar symptoms. Sensation intact.  Psych: Awake and oriented X 3, normal affect, Insight and Judgment appropriate.   Assessment and Plan:  Hypertension: Continue medication, monitor blood pressure at home. Continue DASH diet. Cholesterol: Continue diet and exercise. Check cholesterol.  Pre-diabetes-Continue diet and exercise. Check A1C Vitamin D Def- check level and continue medications.  Afib- check INR  Quentin Mulling 3:36 PM

## 2013-09-02 LAB — BASIC METABOLIC PANEL WITH GFR
BUN: 15 mg/dL (ref 6–23)
Chloride: 99 mEq/L (ref 96–112)
Glucose, Bld: 93 mg/dL (ref 70–99)
Potassium: 4 mEq/L (ref 3.5–5.3)

## 2013-09-02 LAB — TSH: TSH: 1.439 u[IU]/mL (ref 0.350–4.500)

## 2013-09-02 LAB — HEPATIC FUNCTION PANEL
ALT: 14 U/L (ref 0–53)
AST: 19 U/L (ref 0–37)
Alkaline Phosphatase: 47 U/L (ref 39–117)
Indirect Bilirubin: 0.4 mg/dL (ref 0.0–0.9)
Total Protein: 6.4 g/dL (ref 6.0–8.3)

## 2013-09-02 LAB — LIPID PANEL: LDL Cholesterol: 76 mg/dL (ref 0–99)

## 2013-09-02 LAB — VITAMIN D 25 HYDROXY (VIT D DEFICIENCY, FRACTURES): Vit D, 25-Hydroxy: 73 ng/mL (ref 30–89)

## 2013-09-14 ENCOUNTER — Encounter: Payer: Self-pay | Admitting: Emergency Medicine

## 2013-09-14 ENCOUNTER — Ambulatory Visit (INDEPENDENT_AMBULATORY_CARE_PROVIDER_SITE_OTHER): Payer: Medicare Other | Admitting: Emergency Medicine

## 2013-09-14 VITALS — BP 122/76 | HR 64 | Temp 98.6°F | Resp 16 | Ht 70.0 in | Wt 192.0 lb

## 2013-09-14 DIAGNOSIS — J309 Allergic rhinitis, unspecified: Secondary | ICD-10-CM

## 2013-09-14 DIAGNOSIS — R05 Cough: Secondary | ICD-10-CM

## 2013-09-14 DIAGNOSIS — R059 Cough, unspecified: Secondary | ICD-10-CM

## 2013-09-14 DIAGNOSIS — J209 Acute bronchitis, unspecified: Secondary | ICD-10-CM

## 2013-09-14 MED ORDER — METHYLPREDNISOLONE (PAK) 4 MG PO TABS: 4.0000 mg | ORAL_TABLET | Freq: Every day | ORAL | Status: DC

## 2013-09-14 MED ORDER — BENZONATATE 100 MG PO CAPS: 100.0000 mg | ORAL_CAPSULE | Freq: Three times a day (TID) | ORAL | Status: DC | PRN

## 2013-09-14 MED ORDER — AZITHROMYCIN 250 MG PO TABS: ORAL_TABLET | ORAL | Status: AC

## 2013-09-14 MED ORDER — ALBUTEROL SULFATE HFA 108 (90 BASE) MCG/ACT IN AERS: 2.0000 | INHALATION_SPRAY | Freq: Four times a day (QID) | RESPIRATORY_TRACT | Status: DC | PRN

## 2013-09-14 NOTE — Patient Instructions (Signed)
Pneumonia, Adult Pneumonia is an infection of the lungs. It may be caused by a germ (virus or bacteria). Some types of pneumonia can spread easily from person to person. This can happen when you cough or sneeze. HOME CARE  Only take medicine as told by your doctor.  Take your medicine (antibiotics) as told. Finish it even if you start to feel better.  Do not smoke.  You may use a vaporizer or humidifier in your room. This can help loosen thick spit (mucus).  Sleep so you are almost sitting up (semi-upright). This helps reduce coughing.  Rest. A shot (vaccine) can help prevent pneumonia. Shots are often advised for:  People over 11 years old.  Patients on chemotherapy.  People with long-term (chronic) lung problems.  People with immune system problems. GET HELP RIGHT AWAY IF:   You are getting worse.  You cannot control your cough, and you are losing sleep.  You cough up blood.  Your pain gets worse, even with medicine.  You have a fever.  Any of your problems are getting worse, not better.  You have shortness of breath or chest pain. MAKE SURE YOU:   Understand these instructions.  Will watch your condition.  Will get help right away if you are not doing well or get worse. Document Released: 03/19/2008 Document Revised: 12/24/2011 Document Reviewed: 12/22/2010 [facility] Patient Information 2014 [place], [place]. Bronchitis Bronchitis is a problem of the air tubes leading to your lungs. This problem makes it hard for air to get in and out of the lungs. You may cough a lot because your air tubes are narrow. Going without care can cause lasting (chronic) bronchitis. HOME CARE   Drink enough fluids to keep your pee (urine) clear or pale yellow.  Use a cool mist humidifier.  Quit smoking if you smoke. If you keep smoking, the bronchitis might not get better.  Only take medicine as told by your doctor. GET HELP RIGHT AWAY IF:   Coughing keeps you awake.  You  start to wheeze.  You become more sick or weak.  You have a hard time breathing or get short of breath.  You cough up blood.  Coughing lasts more than 2 weeks.  You have a fever.  Your baby is older than 3 months with a rectal temperature of 102 F (38.9 C) or higher.  Your baby is 62 months old or younger with a rectal temperature of 100.4 F (38 C) or higher. MAKE SURE YOU:  Understand these instructions.  Will watch your condition.  Will get help right away if you are not doing well or get worse. Document Released: 03/19/2008 Document Revised: 12/24/2011 Document Reviewed: 05/26/2013 [facility] Patient Information 2014 [place], [place].

## 2013-09-14 NOTE — Progress Notes (Addendum)
Subjective:    Patient ID: [name] Yu, male    DOB: [birth year], 79 y.o.   MRN: [id number]  HPI Comments: 79 yo  male presents for increased yellow productive cough, hoarseness, chills and fatigue. He has been taking allegra and delsym OTC without relief. He feels mild tightness/ SOB with spasmatic coughing only, occasional feels like wheezing.   Cough Associated symptoms include chills and a fever.  Sore Throat  Associated symptoms include coughing.    Current Outpatient Prescriptions on File Prior to Visit  Medication Sig Dispense Refill  . aspirin 81 MG tablet Take 81 mg by mouth daily.      . Calcium Carbonate Antacid (TUMS E-X PO) Take 2 tablets by mouth daily.      . Cholecalciferol 5000 UNITS TABS Take 1 tablet by mouth daily.      . citalopram (CELEXA) 20 MG tablet Take 20 mg by mouth daily.      Marland Kitchen diltiazem (CARDIZEM) 60 MG tablet Take 60 mg by mouth as needed.      . finasteride (PROSCAR) 5 MG tablet 1/2 tab po qd      . LORazepam (ATIVAN) 2 MG tablet Take 2 mg by mouth as needed.       . Magnesium 200 MG TABS Take 1 tablet by mouth daily.      . Multiple Vitamin (MULTIVITAMIN) capsule Take 1 capsule by mouth daily.      . Omega-3 Fatty Acids (FISH OIL PO) Take 1 tablet by mouth daily.      . simvastatin (ZOCOR) 40 MG tablet Take 20 mg by mouth Daily.       . vitamin C (ASCORBIC ACID) 500 MG tablet Take 1,000 mg by mouth daily.      Marland Kitchen warfarin (COUMADIN) 5 MG tablet As directed       No current facility-administered medications on file prior to visit.   ALLERGIES Ciprofloxacin; Novocain; and Prednisone   Past Medical History  Diagnosis Date  . Hypertension   . Pre-diabetes   . Depression   . Dyslipidemia   . OSA (obstructive sleep apnea)   . Atrial fib/flutter, transient   . Colon polyps     2003 hyperplastic and tubuler adenoma  . Diverticulosis     Review of Systems  Constitutional: Positive for fever, chills and fatigue.  Respiratory: Positive for  cough.   All other systems reviewed and are negative.   BP 122/76  Pulse 64  Temp(Src) 98.6 F (37 C) (Temporal)  Resp 16  Ht 5\' 10"  (1.778 m)  Wt 192 lb (87.091 kg)  BMI 27.55 kg/m2     Objective:   Physical Exam  Nursing note and vitals reviewed. Constitutional: He is oriented to person, place, and time. He appears well-developed and well-nourished.  HENT:  Head: Normocephalic and atraumatic.  Right Ear: External ear normal.  Left Ear: External ear normal.  Nose: Nose normal.  Mouth/Throat: Oropharynx is clear and moist. No oropharyngeal exudate.  Eyes: Pupils are equal, round, and reactive to light.  Neck: Normal range of motion. Neck supple. No thyromegaly present.  Cardiovascular: Normal rate, regular rhythm, normal heart sounds and intact distal pulses.   Pulmonary/Chest: Effort normal. He has wheezes.  Mild rhonchi Right  Abdominal: Soft.  Musculoskeletal: Normal range of motion.  Lymphadenopathy:    He has no cervical adenopathy.  Neurological: He is alert and oriented to person, place, and time.  Skin: Skin is warm and dry.  Psychiatric: He has a  normal mood and affect. Judgment normal.          Assessment & Plan:  Cough, bronchitis, Allergic Rhinitis vs early pneumonia- If any SX increase ER. Start ZPAK, Medrol DP, Tessalon Perles, Albuterol inhaler AD. Continue Allegra and Delsym AD. Increase H2O. Decrease Coumadin to 1/2 QD while on Antibiotic w/c with any SE.

## 2013-10-01 ENCOUNTER — Other Ambulatory Visit: Payer: Self-pay | Admitting: Physician Assistant

## 2013-10-08 LAB — PROTIME-INR: Prothrombin Time: 18.9 s — ABNORMAL HIGH (ref 9.1–12.0)

## 2013-10-30 ENCOUNTER — Other Ambulatory Visit: Payer: Self-pay | Admitting: Physician Assistant

## 2013-11-10 NOTE — Telephone Encounter (Signed)
DONE

## 2013-11-18 ENCOUNTER — Ambulatory Visit (INDEPENDENT_AMBULATORY_CARE_PROVIDER_SITE_OTHER): Payer: Medicare Other | Admitting: Physician Assistant

## 2013-11-18 ENCOUNTER — Encounter: Payer: Self-pay | Admitting: Physician Assistant

## 2013-11-18 VITALS — BP 110/70 | HR 64 | Temp 98.1°F | Resp 16 | Wt 192.0 lb

## 2013-11-18 DIAGNOSIS — J209 Acute bronchitis, unspecified: Secondary | ICD-10-CM

## 2013-11-18 DIAGNOSIS — I4891 Unspecified atrial fibrillation: Secondary | ICD-10-CM

## 2013-11-18 LAB — CBC WITH DIFFERENTIAL/PLATELET
BASOS ABS: 0 10*3/uL (ref 0.0–0.1)
BASOS PCT: 0 % (ref 0–1)
EOS ABS: 0.5 10*3/uL (ref 0.0–0.7)
Eosinophils Relative: 7 % — ABNORMAL HIGH (ref 0–5)
HEMATOCRIT: 44.8 % (ref 39.0–52.0)
HEMOGLOBIN: 15.7 g/dL (ref 13.0–17.0)
Lymphocytes Relative: 29 % (ref 12–46)
Lymphs Abs: 1.9 10*3/uL (ref 0.7–4.0)
MCH: 32 pg (ref 26.0–34.0)
MCHC: 35 g/dL (ref 30.0–36.0)
MCV: 91.4 fL (ref 78.0–100.0)
MONO ABS: 0.6 10*3/uL (ref 0.1–1.0)
MONOS PCT: 9 % (ref 3–12)
NEUTROS ABS: 3.6 10*3/uL (ref 1.7–7.7)
NEUTROS PCT: 55 % (ref 43–77)
Platelets: 206 10*3/uL (ref 150–400)
RBC: 4.9 MIL/uL (ref 4.22–5.81)
RDW: 13.8 % (ref 11.5–15.5)
WBC: 6.5 10*3/uL (ref 4.0–10.5)

## 2013-11-18 LAB — HEPATIC FUNCTION PANEL
ALBUMIN: 4.1 g/dL (ref 3.5–5.2)
ALT: 13 U/L (ref 0–53)
AST: 19 U/L (ref 0–37)
Alkaline Phosphatase: 51 U/L (ref 39–117)
Bilirubin, Direct: 0.1 mg/dL (ref 0.0–0.3)
Indirect Bilirubin: 0.4 mg/dL (ref 0.2–1.2)
TOTAL PROTEIN: 6.1 g/dL (ref 6.0–8.3)
Total Bilirubin: 0.5 mg/dL (ref 0.2–1.2)

## 2013-11-18 LAB — BASIC METABOLIC PANEL WITH GFR
BUN: 13 mg/dL (ref 6–23)
CALCIUM: 9.1 mg/dL (ref 8.4–10.5)
CO2: 29 mEq/L (ref 19–32)
Chloride: 102 mEq/L (ref 96–112)
Creat: 0.78 mg/dL (ref 0.50–1.35)
GFR, EST NON AFRICAN AMERICAN: 83 mL/min
GLUCOSE: 98 mg/dL (ref 70–99)
Potassium: 5 mEq/L (ref 3.5–5.3)
Sodium: 139 mEq/L (ref 135–145)

## 2013-11-18 MED ORDER — PREDNISONE 20 MG PO TABS: ORAL_TABLET | ORAL | Status: DC

## 2013-11-18 MED ORDER — AZITHROMYCIN 250 MG PO TABS: ORAL_TABLET | ORAL | Status: AC

## 2013-11-18 NOTE — Patient Instructions (Signed)
Your goal for your INR is between 2-3.  If you number is below 2 than your blood is thick and you need more coumadin. You are at risk for stroke or clotting during this time period.  If you number is above 3 than your blood is too thin and you need less coumadin or can eat more greens at this time. You are at risk for stomach or head bleeds and need to be careful.   While you are on the antibiotic please decrease your coumadin to 1/2 pill daily and when you get off of the antibiotic go back to your normal dose.   The majority of colds are caused by viruses and do not require antibiotics. Please read the rest of this hand out to learn more about the common cold and what you can do to help yourself as well as help prevent the over use of antibiotics.   COMMON COLD SIGNS AND SYMPTOMS - The common cold usually causes nasal congestion, runny nose, and sneezing. A sore throat may be present on the first day but usually resolves quickly. If a cough occurs, it generally develops on about the fourth or fifth day of symptoms, typically when congestion and runny nose are resolving  COMMON COLD COMPLICATIONS - In most cases, colds do not cause serious illness or complications. Most colds last for three to seven days, although many people continue to have symptoms (coughing, sneezing, congestion) for up to two weeks.  One of the more common complications is sinusitis, which is usually caused by viruses and rarely (about 2 percent of the time) by bacteria. Having thick or yellow to green-colored nasal discharge does not mean that bacterial sinusitis has developed; discolored nasal discharge is a normal phase of the common cold.  Lower respiratory infections, such as pneumonia or bronchitis, may develop following a cold.  Infection of the middle ear, or otitis media, can accompany or follow a cold.  COMMON COLD TREATMENT - There is no specific treatment for the viruses that cause the common cold. Most treatments  are aimed at relieving some of the symptoms of the cold, but do not shorten or cure the cold. Antibiotics are not useful for treating the common cold; antibiotics are only used to treat illnesses caused by bacteria, not viruses. Unnecessary use of antibiotics for the treatment of the common cold can cause allergic reactions, diarrhea, or other gastrointestinal symptoms in some patients.  The symptoms of a cold will resolve over time, even without any treatment. People with underlying medical conditions and those who use other over-the-counter or prescription medications should speak with their healthcare provider or pharmacist to ensure that it is safe to use these treatments. The following are treatments that may reduce the symptoms caused by the common cold.  Nasal congestion - Decongestants are good for nasal congestion- if you feel very stuffy but no mucus is coming out, this is the medication that will help you the most.  Pseudoephedrine is a decongestant that can improve nasal congestion. Although a prescription is not required, drugstores in the [place] keep pseudoephedrine behind the counter, so it must be requested from a pharmacist. If you have a heart condition or high blood pressure please use Coricidin BPH instead.   Runny nose - Antihistamines such as diphenhydramine (Benadryl), certazine (Zyrtec) which are best taking at night because they can make you tired OR loratadine (Claritin),  fexafinadine (Allegra) help with a runny nose.   Nasal sprays such an oxymetazoline (Afrin and  others) may also give temporary relief of nasal congestion. However, these sprays should never be used for more than two to three days; use for more than three days use can worsen congestion.  Nasocort is now over the counter and can help decrease a runny nose. Please stop the medication if you have blurry vision or nose bleeds.   Sore throat and headache - Sore throat and headache are best treated with a mild  pain reliever such as acetaminophen (Tylenol) or a non-steroidal anti-inflammatory agent such as ibuprofen or naproxen (Motrin or Aleve). These medications should be taken with food to prevent stomach problems. As well as gargling with warm water and salt.   Cough - Common cough medicine ingredients include guaifenesin and dextromethorphan; these are often combined with other medications in over-the-counter cold formulas. Often a cough is worse at night or first in the morning due to post nasal drip from you nose. You can try to sleep at an angle to decrease a cough.   Alternative treatments - Heated, humidified air can improve symptoms of nasal congestion and runny nose, and causes few to no side effects. A number of alternative products, including vitamin C, doubling up on your vitamin D and herbal products such as echinacea, may help. Certain products, such as nasal gels that contain zinc (eg, Zicam), have been associated with a permanent loss of smell.  Antibiotics - Antibiotics should not be used to treat an uncomplicated common cold. As noted above, colds are caused by viruses. Antibiotics treat bacterial, not viral infections. Some viruses that cause the common cold can also depress the immune system or cause swelling in the lining of the nose or airways; this can, in turn, lead to a bacterial infection. Often you need to give your body 7 days to fight off a common cold while treating the symptoms with the medications listed above. If after 7 days your symptoms are not improving, you are getting worse, you have shortness of breath, chest pain, a fever of over 103 you should seek medical help immediately.   PREVENTION IS THE BEST MEDICINE - Hand washing is an essential and highly effective way to prevent the spread of infection.  Alcohol-based hand rubs are a good alternative for disinfecting hands if a sink is not available.  Hands should be washed before preparing food and eating and after  coughing, blowing the nose, or sneezing. While it is not always possible to limit contact with people who may be infected with a cold, touching the eyes, nose, or mouth after direct contact should be avoided when possible. Sneezing/coughing into the sleeve of one's clothing (at the inner elbow) is another means of containing sprays of saliva and secretions and does not contaminate the hands.

## 2013-11-18 NOTE — Progress Notes (Signed)
   Subjective:    Patient ID: [name] Yu, male    DOB: 1927/03/21, 80 y.o.   MRN: [id number]  Cough This is a new problem. The current episode started in the past 7 days. The problem has been gradually worsening. The problem occurs constantly. The cough is productive of purulent sputum and productive of blood-tinged sputum. Associated symptoms include chills, ear congestion, nasal congestion, postnasal drip, shortness of breath and wheezing. Pertinent negatives include no ear pain, fever, rhinorrhea or sore throat. Nothing aggravates the symptoms. He has tried nothing for the symptoms. His past medical history is significant for COPD.   CHEST - 2 VIEW 04/2013 Comparison: 08/30/2011  Findings: Dense reticulonodular opacities are present peripherally  in both lung bases as before. Tortuous thoracic aorta noted with  thoracic spondylosis. Cardiac size within normal limits.  Suspected old granulomatous disease.  IMPRESSION:  1. Stable basilar fibrosis peripherally. No acute findings.  2. Tortuous thoracic aorta.  Has been taking M,W,F whole pill and other days 1/2 pill.   Review of Systems  Constitutional: Positive for chills. Negative for fever, diaphoresis and fatigue.  HENT: Positive for congestion, postnasal drip and sinus pressure. Negative for ear pain, facial swelling, rhinorrhea, sneezing and sore throat.   Eyes: Negative.   Respiratory: Positive for cough, shortness of breath and wheezing. Negative for chest tightness.   Cardiovascular: Negative.   Gastrointestinal: Negative.   Genitourinary: Negative.   Musculoskeletal: Negative.   Neurological: Negative.        Objective:   Physical Exam  Constitutional: He is oriented to person, place, and time. He appears well-developed and well-nourished.  HENT:  Head: Normocephalic and atraumatic.  Right Ear: External ear normal.  Left Ear: External ear normal.  Nose: Nose normal.  Mouth/Throat: Oropharynx is clear and moist.   Eyes: Conjunctivae are normal. Pupils are equal, round, and reactive to light.  Neck: Normal range of motion. Neck supple.  Cardiovascular: Normal rate, regular rhythm and normal heart sounds.   No murmur heard. Pulmonary/Chest: Effort normal. No respiratory distress. He has wheezes. He has no rales. He exhibits no tenderness.  Abdominal: Soft. Bowel sounds are normal.  Lymphadenopathy:    He has no cervical adenopathy.  Neurological: He is alert and oriented to person, place, and time.  Skin: Skin is warm and dry.       Assessment & Plan:  Bronchitis/sinusitis- If any SX increase ER. Start ZPAK, prednisone Tessalon Perles, Albuterol inhaler AD. Continue Allegra and Delsym AD. Increase H2O. Decrease Coumadin to 1/2 QD while on Antibiotic w/c with any SE.

## 2013-11-19 LAB — PROTIME-INR
INR: 2.77 — AB (ref <1.50)
PROTHROMBIN TIME: 28.5 s — AB (ref 11.6–15.2)

## 2013-11-21 DIAGNOSIS — S301XXA Contusion of abdominal wall, initial encounter: Secondary | ICD-10-CM

## 2013-11-21 HISTORY — DX: Contusion of abdominal wall, initial encounter: S30.1XXA

## 2013-11-24 ENCOUNTER — Encounter: Payer: Self-pay | Admitting: Physician Assistant

## 2013-11-27 ENCOUNTER — Ambulatory Visit (INDEPENDENT_AMBULATORY_CARE_PROVIDER_SITE_OTHER): Payer: Medicare Other | Admitting: Internal Medicine

## 2013-11-27 ENCOUNTER — Encounter: Payer: Self-pay | Admitting: Internal Medicine

## 2013-11-27 VITALS — BP 116/62 | HR 88 | Temp 97.9°F | Resp 18 | Wt 191.4 lb

## 2013-11-27 DIAGNOSIS — R112 Nausea with vomiting, unspecified: Secondary | ICD-10-CM

## 2013-11-27 DIAGNOSIS — I10 Essential (primary) hypertension: Secondary | ICD-10-CM

## 2013-11-27 DIAGNOSIS — J209 Acute bronchitis, unspecified: Secondary | ICD-10-CM

## 2013-11-27 MED ORDER — ONDANSETRON HCL 8 MG PO TABS: ORAL_TABLET | ORAL | Status: DC

## 2013-11-27 MED ORDER — DOXYCYCLINE HYCLATE 100 MG PO CAPS: ORAL_CAPSULE | ORAL | Status: DC

## 2013-11-27 MED ORDER — PREDNISONE 20 MG PO TABS: 20.0000 mg | ORAL_TABLET | ORAL | Status: DC

## 2013-11-27 MED ORDER — DILTIAZEM HCL ER 120 MG PO CP12: 120.0000 mg | ORAL_CAPSULE | Freq: Two times a day (BID) | ORAL | Status: DC

## 2013-11-27 NOTE — Patient Instructions (Signed)
  Recommend Imodium (Loperamide) 4 mg 1 to 2 tablets for diarrhea  Up to 12 tablets per 24 hours    Nausea and Vomiting Nausea means you feel sick to your stomach. Throwing up (vomiting) is a reflex where stomach contents come out of your mouth. HOME CARE   Take medicine as told by your doctor.  Do not force yourself to eat. However, you do need to drink fluids.  If you feel like eating, eat a normal diet as told by your doctor.  Eat rice, wheat, potatoes, bread, lean meats, yogurt, fruits, and vegetables.  Avoid high-fat foods.  Drink enough fluids to keep your pee (urine) clear or pale yellow.  Ask your doctor how to replace body fluid losses (rehydrate). Signs of body fluid loss (dehydration) include:  Feeling very thirsty.  Dry lips and mouth.  Feeling dizzy.  Dark pee.  Peeing less than normal.  Feeling confused.  Fast breathing or heart rate. GET HELP RIGHT AWAY IF:   You have blood in your throw up.  You have black or bloody poop (stool).  You have a bad headache or stiff neck.  You feel confused.  You have bad belly (abdominal) pain.  You have chest pain or trouble breathing.  You do not pee at least once every 8 hours.  You have cold, clammy skin.  You keep throwing up after 24 to 48 hours.  You have a fever. MAKE SURE YOU:   Understand these instructions.  Will watch your condition.  Will get help right away if you are not doing well or get worse. Document Released: 03/19/2008 Document Revised: 12/24/2011 Document Reviewed: 03/02/2011 [facility] Patient Information 2014 [place], [place].

## 2013-11-27 NOTE — Progress Notes (Signed)
Subjective:    Patient ID: [name] Yu, male    DOB: 19-Nov-1926, 80 y.o.   MRN: [id number]  HPI 80 yo MWM on long term coumadin Tx for pAfib who was recently admitted at [facility] for a large spontaneous Rectus sheath hematoma with discontinuance of coumadin and LD bASA who was only home 3 days til he developed N/V and diarrhea and was seen yesterday and released from the ER after being given IVF's.   He presents today with persistant Sx;s of Nausea , some diarrhea poor po intake and occasional elevated pulse up to 111/min per his wife (and daughter also in attendance). Denies fever or chills, but has had some night sweats.    He has had some head and chest congestion and productive sputum.No blood in emesis orBM's and he specifically denies any Htburn, waterbrash or reflux Sx's. Also, he denies any cardiac Sx's as CP, palpitations or dyspnea.    Medication List       aspirin 81 MG tablet  Take 81 mg by mouth daily.     Cholecalciferol 5000 UNITS Tabs  Take 1 tablet by mouth daily.     citalopram 20 MG tablet  Commonly known as:  CELEXA  Take 20 mg by mouth daily.     diltiazem 120 MG 12 hr capsule  Commonly known as:  CARDIZEM SR  Take 1 capsule (120 mg total) by mouth 2 (two) times daily.     doxycycline 100 MG capsule  Commonly known as:  VIBRAMYCIN  1 capsule twice daily with food for 1 week,  then once daily with food for 1 week - for infection     finasteride 5 MG tablet  Commonly known as:  PROSCAR  1/2 tab po qd     FISH OIL PO  Take 1 tablet by mouth daily.     LORazepam 2 MG tablet  Commonly known as:  ATIVAN  Take 2 mg by mouth as needed.     Magnesium 200 MG Tabs  Take 1 tablet by mouth daily.     multivitamin capsule  Take 1 capsule by mouth daily.     ondansetron 8 MG tablet  Commonly known as:  ZOFRAN  Take 1 tablet 3 x daily for nausea     predniSONE 20 MG tablet  Commonly known as:  DELTASONE  Take 1 tablet (20 mg total) by mouth See admin  instructions. 1 tab 3 x day for 3 days, then 1 tab 2 x day for 3 days, then 1 tab 1 x day for 5 days     simvastatin 40 MG tablet  Commonly known as:  ZOCOR  Take 20 mg by mouth Daily.     TUMS E-X PO  Take 2 tablets by mouth daily.     vitamin C 500 MG tablet  Commonly known as:  ASCORBIC ACID  Take 1,000 mg by mouth daily.     warfarin 5 MG tablet  Commonly known as:  COUMADIN  As directed       Allergies  Allergen Reactions  . Ciprofloxacin     dysphoria  . Novocain [Procaine Hcl]   . Prednisone     Nervousness   Past Medical History  Diagnosis Date  . Hypertension   . Pre-diabetes   . Depression   . Dyslipidemia   . OSA (obstructive sleep apnea)   . Atrial fib/flutter, transient     off coumadin since 11/2013- CHADSVAC of 2  . Colon  polyps     2003 hyperplastic and tubuler adenoma  . Diverticulosis   . Rectus sheath hematoma 11/21/2013    Dr. Deatra Ina    Review of Systems 12 pt systems review negative to above.  Objective:   Physical Exam  Constitutional: He is oriented to person, place, and time. He appears distressed.  HENT:  Head: Normocephalic.  Right Ear: External ear normal.  Left Ear: External ear normal.  Nose: Nose normal.  Mouth/Throat: Oropharynx is clear and moist. No oropharyngeal exudate.  Eyes: EOM are normal. Pupils are equal, round, and reactive to light.  Neck: Normal range of motion. Neck supple. No JVD present. No thyromegaly present.  Cardiovascular: Normal rate, regular rhythm and normal heart sounds.  Exam reveals no gallop.   No murmur heard. Pulmonary/Chest: Effort normal and breath sounds normal. No respiratory distress. He has no wheezes. He has no rales. He exhibits no tenderness.  Abdominal: Soft. Bowel sounds are normal. He exhibits distension. He exhibits no mass. There is tenderness. There is no rebound and no guarding.  Yellowish-green ecchymoses noted over the lower abdomen. There is some superficial tenderness over the LUQ  where he had his reported Rectus sheath hematoma.  Musculoskeletal: Normal range of motion. He exhibits no edema and no tenderness.  Lymphadenopathy:    He has no cervical adenopathy.  Neurological: He is oriented to person, place, and time. No cranial nerve deficit. Coordination normal.  Skin: Skin is warm and dry. No rash noted. He is not diaphoretic. No erythema. There is pallor.  Psychiatric: He has a normal mood and affect. Judgment normal.    Assessment & Plan:   1. Unspecified essential hypertension and Hx/o pAfib  - Increase diltiazem (CARDIZEM SR) 120 MG 12 hr capsule; Take 1 capsule (120 mg total) by mouth 2 (two) times daily.  Dispense: 60 capsule; Refill: 11  2. Nausea with vomiting  - ondansetron (ZOFRAN) 8 MG tablet; Take 1 tablet 3 x daily for nausea  Dispense: 30 tablet; Refill: 1 - Advised use loperamide prn Diarrhea   3. Acute bronchitis  - doxycycline (VIBRAMYCIN) 100 MG capsule; 1 capsule twice daily with food for 1 week,  then once daily with food for 1 week - for infection  Dispense: 21 capsule; Refill: 0  - predniSONE (DELTASONE) 20 MG tablet; Take 1 tablet (20 mg total) by mouth See admin instructions. 1 tab 3 x day for 3 days, then 1 tab 2 x day for 3 days, then 1 tab 1 x day for 5 days  Dispense: 20 tablet; Refill: 0  - ROV 2 weeks for EKG  Approx 40 min spent reviewing records from [facility], ER visit, face to face with patient and family and formulating a diagnosis and plan and reviewing with patient and family .

## 2013-12-01 ENCOUNTER — Ambulatory Visit: Payer: Self-pay | Admitting: Internal Medicine

## 2013-12-09 ENCOUNTER — Ambulatory Visit (INDEPENDENT_AMBULATORY_CARE_PROVIDER_SITE_OTHER): Payer: Medicare Other | Admitting: Emergency Medicine

## 2013-12-09 VITALS — BP 104/62 | HR 76 | Temp 98.0°F | Resp 16 | Ht 70.0 in | Wt 182.0 lb

## 2013-12-09 DIAGNOSIS — R339 Retention of urine, unspecified: Secondary | ICD-10-CM

## 2013-12-09 NOTE — Progress Notes (Signed)
Subjective:    Patient ID: [name] Yu, male    DOB: 08-16-1927, 80 y.o.   MRN: [id number]  HPI Comments: 80 yo male comes in for urinary catheter removal, originally placed about 1 week ago for urinary retention by [facility] hospital. Patient could not get in to Urology  AD to have removed. Blood work at [facility] on Saturday with WBC mildly elevated 17, they thought it was due to stress. He has been fatigued since being in hospital the 1st time for coughing and bleed associated with coumadin and straining from cough. He notes bruising has improved in abdomen area but still has large pocket of swelling in left upper abdomen and mild swelling in testicles. Testicles also still with ecchymosis but have improved. He has not been very active with multiple hospital stays but feels he is starting to improve.   CT abdomen and pelvis with IV contrastComparison:  CT dated 09/05/2007.Indication:  789.00 Abdominal pain, unspecified site, ABDOMINAL PAIN.Technique:  CT imaging was performed of the abdomen and pelvis followingthe uncomplicated administration of intravenous contrast (Isovue-300, 166mL at 3 mL/sec).  Iodinated contrast was used due to the indications forthe examination, to improve disease detection and further define anatomy.The most recent serum creatinine is 0.9 mg/dL.   Coronal reformatted imageswere generated and reviewed.Findings:There is bibasilar atelectasis with chronic changes. There are unchangedbibasilar high density airspace opacities, likely old aspirated contrast.There is no pleural or pericardial effusion. The heart size is normal.There are atherosclerotic changes of the aorta.There are a few scattered cysts in the liver. There is no intrahepatic orextrahepatic biliary dilatation. The gallbladder is normal. The adrenals,pancreas, and spleen are unremarkable without evidence for focal lesion.There is a small splenule adjacent to the spleen.The kidneys enhance symmetrically. There is no  pelvicaliectasis orureterectasis. There are no renal parenchymal lesions. There are fewnonobstructing calculi in the bilateral kidneys.There are multiple loops of nondilated large and small bowel withoutevidence for bowel obstruction or bowel wall thickening. The appendix iswell see and demonstrates no evidence of appendicitis. There is no abnormalmesenteric stranding. There is no mesenteric or retroperitoneallymphadenopathy. The urinary bladder is normal. There is no pelviclymphadenopathy.There are no aggressive appearing lytic or sclerotic osseous lesions. Thereare degenerative changes of the spine. There is a large left rectusintramuscular hematoma with active extravasation. The hematoma measures13.7 x 7.1 x 11.5 cm.Impression:1. Large left rectus intramuscular hematoma with active extravasation.The findings were discussed with Dr. Sunday Shams by Dr. Meda Coffee at  [phone] hours on2/04/2014.    Medication List       This list is accurate as of: 12/09/13 11:59 PM.  Always use your most recent med list.               ATIVAN 2 MG tablet  Generic drug:  LORazepam  Take 2 mg by mouth daily as needed for anxiety.     Cholecalciferol 5000 UNITS Tabs  Take 1 tablet by mouth daily.     citalopram 20 MG tablet  Commonly known as:  CELEXA  Take 20 mg by mouth daily.     diltiazem 120 MG 12 hr capsule  Commonly known as:  CARDIZEM SR  Take 1 capsule (120 mg total) by mouth 2 (two) times daily.     finasteride 5 MG tablet  Commonly known as:  PROSCAR  1/2 tab po qd     FISH OIL PO  Take 1 tablet by mouth daily.     Magnesium 200 MG Tabs  Take 1 tablet by mouth daily.  multivitamin capsule  Take 1 capsule by mouth daily.     simvastatin 40 MG tablet  Commonly known as:  ZOCOR  Take 20 mg by mouth Daily.     TUMS E-X PO  Take 2 tablets by mouth daily.     UNABLE TO FIND  Take 60 mg by mouth as needed (only takes 1 x every 3-4 months PRN elevated pulse). Cartia XL- Old RX     vitamin C 500  MG tablet  Commonly known as:  ASCORBIC ACID  Take 1,000 mg by mouth daily.       Allergies  Allergen Reactions  . Ciprofloxacin     dysphoria  . Novocain [Procaine Hcl]   . Prednisone     Nervousness   Past Medical History  Diagnosis Date  . Hypertension   . Pre-diabetes   . Depression   . Dyslipidemia   . OSA (obstructive sleep apnea)   . Atrial fib/flutter, transient     off coumadin since 11/2013- CHADSVAC of 2  . Colon polyps     2003 hyperplastic and tubuler adenoma  . Diverticulosis   . Rectus sheath hematoma 11/21/2013    Dr. Deatra Ina       Review of Systems  Constitutional: Positive for fatigue.  Gastrointestinal: Positive for abdominal distention.  Genitourinary: Positive for scrotal swelling and difficulty urinating.  Hematological: Bruises/bleeds easily.  All other systems reviewed and are negative.   BP 104/62  Pulse 76  Temp(Src) 98 F (36.7 C) (Temporal)  Resp 16  Ht 5\' 10"  (1.778 m)  Wt 182 lb (82.555 kg)  BMI 26.11 kg/m2     Objective:   Physical Exam  Nursing note and vitals reviewed. Constitutional: He is oriented to person, place, and time. He appears well-developed and well-nourished.  HENT:  Head: Normocephalic and atraumatic.  Right Ear: External ear normal.  Left Ear: External ear normal.  Nose: Nose normal.  Eyes: Conjunctivae and EOM are normal.  Neck: Normal range of motion. Neck supple. No JVD present. No thyromegaly present.  Cardiovascular: Normal rate, regular rhythm, normal heart sounds and intact distal pulses.   Pulmonary/Chest: Effort normal and breath sounds normal.  Abdominal: Soft. Bowel sounds are normal. He exhibits no distension and no mass. There is no tenderness. There is no rebound and no guarding.    Grapefruit size area of ? Edema vs hernia/ hematoma non reducible/ non fluctuant   Genitourinary:  Both testicles swollen and mildly tender with mild ecchymosis  Musculoskeletal: Normal range of motion. He  exhibits no edema and no tenderness.  Lymphadenopathy:    He has no cervical adenopathy.  Neurological: He is alert and oriented to person, place, and time. He has normal reflexes. No cranial nerve deficit. Coordination normal.  Skin: Skin is warm and dry.  Psychiatric: He has a normal mood and affect. His behavior is normal. Judgment and thought content normal.     Urinary catheter Tray prepped and patient informed of procedure. Patient gave verbal consent to remove catheter, balloon was deflated by removing saline and catheter was removed successfully without any blood and minimal pain. Urine in bag was dark yellow.     Assessment & Plan:  1. Urinary retention HX-Removed catheter advised increase h2o/ cranberry juice over the next 3 days, if no urine production in 24 hours or increase pain in pelvis ER, ref Urology 2. ? Rectus hematoma as described per ct from [facility] continue to monitor, please call if no resolution within 2 weeks  or if symptoms increase. 3. Fatigue with hx of abnormal CBC- declines recheck labs, increase activity and H2O, w/c if SX increase or ER. Advised needs recheck 2 weeks OV. 4. ? Cough HX- advised to try OTC prilosec BID and allegra QD next episode and f/u with results

## 2013-12-09 NOTE — Patient Instructions (Signed)
Acute Urinary Retention °Acute urinary retention is when you are unable to pee (urinate). Acute urinary retention is common in older men. Prostates can get bigger, which blocks the flow of pee.  °HOME CARE °· Drink enough fluids to keep your pee clear or pale yellow. °· If you are sent home with a tube that drains the bladder (catheter), there will be a drainage bag attached to it. There are two types of bags. One is big that you can wear at night without having to empty it. One is smaller and needs to be emptied more often. °· Keep the drainage bag empty. °· Keep the drainage bag lower than your catheter. °· Only take medicine as told by your doctor. °GET HELP IF: °· You have a low-grade fever. °· You have spasms or you are leaking pee when you have spasms. °GET HELP RIGHT AWAY IF:  °· You have chills or a fever. °· Your catheter stops draining pee. °· Your catheter falls out. °· You have increased bleeding that does not stop after you have rested and increased the amount of fluids you had been drinking. °MAKE SURE YOU:  °· Understand these instructions. °· Will watch your condition. °· Will get help right away if you are not doing well or get worse. °Document Released: 03/19/2008 Document Revised: 07/22/2013 Document Reviewed: 03/12/2013 °ExitCare® Patient Information ©2014 [employer]. ° °

## 2013-12-11 ENCOUNTER — Encounter: Payer: Self-pay | Admitting: Emergency Medicine

## 2013-12-14 ENCOUNTER — Telehealth: Payer: Self-pay | Admitting: *Deleted

## 2013-12-14 NOTE — Telephone Encounter (Signed)
Message copied by Waunita Schooner on Mon Dec 14, 2013 12:10 PM ------      Message from: Bradly Bienenstock A      Created: Fri Dec 11, 2013  8:23 AM      Regarding: Please call and sched appt. Pt aware of message, just needs appt.                   ----- Message -----         From: Ardis Hughs, PA-C         Sent: 12/11/2013   5:58 AM           To: Oneita Hurt, CMA            Please advise them we are working on urology referral. I viewed ct of abdomen and the LUQ area is a hematoma but I want to see him back in 2 weeks to recheck that area and recheck CBC. Ask how the urination is coming? PLs and thx ------

## 2013-12-22 ENCOUNTER — Ambulatory Visit (INDEPENDENT_AMBULATORY_CARE_PROVIDER_SITE_OTHER): Payer: Medicare Other | Admitting: Internal Medicine

## 2013-12-22 ENCOUNTER — Encounter: Payer: Self-pay | Admitting: Internal Medicine

## 2013-12-22 VITALS — BP 104/66 | HR 68 | Temp 97.7°F | Resp 16 | Ht 70.0 in | Wt 187.0 lb

## 2013-12-22 DIAGNOSIS — I10 Essential (primary) hypertension: Secondary | ICD-10-CM

## 2013-12-22 DIAGNOSIS — I4891 Unspecified atrial fibrillation: Secondary | ICD-10-CM

## 2013-12-22 MED ORDER — DILTIAZEM HCL ER 180 MG PO CP24: 180.0000 mg | ORAL_CAPSULE | Freq: Every day | ORAL | Status: DC

## 2013-12-22 NOTE — Progress Notes (Signed)
   Subjective:    Patient ID: [name] Yu, male    DOB: 1926-12-07, 80 y.o.   MRN: [id number]  HPI Patient is seen in F/U of recent spontaneous large abdominal wall Rectus sheath Hematoma attributed to coumadin at therapeutic INR. He had been on coumadin for years for pAfib and it was discontinued  At [facility] due to the bleeding and relative risk vs. His low CHAD's score. The did recommend he take Diltiazem on a regular basis. He denies and cardiac Sx's.   Medication List       ATIVAN 2 MG tablet  Generic drug:  LORazepam  Take 2 mg by mouth daily as needed for anxiety.     Cholecalciferol 5000 UNITS Tabs  Take 1 tablet by mouth daily.     citalopram 20 MG tablet  Commonly known as:  CELEXA  Take 20 mg by mouth daily.     diltiazem 180 MG 24 hr capsule  Commonly known as:  DILACOR XR  Take 1 capsule (180 mg total) by mouth daily.     finasteride 5 MG tablet  Commonly known as:  PROSCAR  1/2 tab po qd     FISH OIL PO  Take 1 tablet by mouth daily.     Magnesium 200 MG Tabs  Take 1 tablet by mouth daily.     multivitamin capsule  Take 1 capsule by mouth daily.     simvastatin 40 MG tablet  Commonly known as:  ZOCOR  Take 20 mg by mouth Daily.     TUMS E-X PO  Take 2 tablets by mouth daily.     vitamin C 500 MG tablet  Commonly known as:  ASCORBIC ACID  Take 1,000 mg by mouth daily.       Allergies  Allergen Reactions  . Ciprofloxacin     dysphoria  . Novocain [Procaine Hcl]   . Prednisone     Nervousness   Past Medical History  Diagnosis Date  . Hypertension   . Pre-diabetes   . Depression   . Dyslipidemia   . OSA (obstructive sleep apnea)   . Atrial fib/flutter, transient     off coumadin since 11/2013- CHADSVAC of 2  . Colon polyps     2003 hyperplastic and tubuler adenoma  . Diverticulosis   . Rectus sheath hematoma - recent 11/21/2013    Dr. Deatra Ina @ [facility]   Review of Systems otherwise negative  BP: 104/66  Pulse: 68  Temp:  97.7 F (36.5 C)  Resp: 16   Objective:   Physical Exam  HEENT - Eac's patent. TM's Nl.EOM's full. PERRLA. NasoOroPharynx clear. Neck - supple. Nl Thyroid. No bruits nodes JVD Chest - Clear equal BS Cor - Nl HS. RRR w/o sig MGR. PP 1(+) No edema. Abd - No palpable organomegaly, masses or tenderness. BS nl. MS- FROM. w/o deformities. Muscle power tone and bulk Nl. Gait Nl. Neuro - No obvious Cr N abnormalities. Sensory, motor and Cerebellar functions appear Nl w/o focal abnormalities.  Assessment & Plan:  1. Hypertension  2. Atrial fibrillation, Paroxysmal - recc he restart his low dose bASA 81 mg / da. - change Diltiazem 120 mg bid ti 180 mb daily

## 2013-12-29 LAB — PROTIME-INR
INR: 2.5 — AB (ref 0.8–1.2)
PROTHROMBIN TIME: 25.6 s — AB (ref 9.1–12.0)

## 2013-12-30 ENCOUNTER — Ambulatory Visit: Payer: Self-pay | Admitting: Internal Medicine

## 2014-02-11 ENCOUNTER — Ambulatory Visit (INDEPENDENT_AMBULATORY_CARE_PROVIDER_SITE_OTHER): Payer: Medicare Other | Admitting: Cardiovascular Disease

## 2014-02-11 ENCOUNTER — Encounter: Payer: Self-pay | Admitting: Cardiovascular Disease

## 2014-02-11 VITALS — BP 124/76 | HR 52 | Ht 70.0 in | Wt 185.5 lb

## 2014-02-11 DIAGNOSIS — I10 Essential (primary) hypertension: Secondary | ICD-10-CM

## 2014-02-11 DIAGNOSIS — I4891 Unspecified atrial fibrillation: Secondary | ICD-10-CM

## 2014-02-11 DIAGNOSIS — E785 Hyperlipidemia, unspecified: Secondary | ICD-10-CM

## 2014-02-11 DIAGNOSIS — S301XXA Contusion of abdominal wall, initial encounter: Secondary | ICD-10-CM

## 2014-02-11 NOTE — Assessment & Plan Note (Signed)
Tolerating simvastatin. Suggested he could hold this for several weeks given his leg cramping, restart if no improvement in his symptoms

## 2014-02-11 NOTE — Assessment & Plan Note (Signed)
Blood pressure is well controlled on today's visit. No changes made to the medications. 

## 2014-02-11 NOTE — Progress Notes (Signed)
Patient ID: [name] Yu, male    DOB: [birth year], 80 y.o.   MRN: [id number]  HPI Comments: 80 yo gentleman with no known coronary artery disease, previously followed at [facility], history of HTN, elevated lipids and sleep apnea, bradycardia on calcium channel blockers and beta blockers, presents for routine followup. Previously followed at the [facility]. History of atrial fibrillation in the past  Last seen one year ago. Over the course of the past year, no significant atrial fibrillation episodes. He does report one episode of palpitations concerning for atrial fibrillation. He took diltiazem 30 mg x1, rested, seemed to go back to normal rhythm after several hours.  He reports that since Thanksgiving 2014 he has had significant medical problems. Developed a severe cough in November, saw a physician in December 2014 and prescribed antibiotics. Cough recurred January 2015 requiring more antibiotics. He's had problems with dehydration, urinary tract infections. Severe coughing caused him to generate a hernia on his left flank with very large hematoma. He was taken off warfarin at that time. Hematoma has resolved. He reports having recent muscle cramping in his legs, now on Levaquin for urinary tract infection. He feels the Levaquin is causing his muscle cramps.    Multiple previous episodes of chest pain not thought to be cardiac.  Had normal cath in 2003.  Normal stress test in 2005 at [facility].  07/2006 had Afib w/u at [facility].  [facility] and on cardizem Dose decreased due to bradycardia Another normal stress test in 2007.   EKG shows normal sinus rhythm with rate 52 beats per minute, T wave abnormality in V3 to V6, left axis deviation/left anterior fascicular block    Outpatient Encounter Prescriptions as of 02/11/2014  Medication Sig  . Calcium Carbonate Antacid (TUMS E-X PO) Take 2 tablets by mouth daily.  . Cholecalciferol 5000 UNITS TABS Take 1 tablet by mouth daily.  Marland Kitchen diltiazem (DILACOR XR)  180 MG 24 hr capsule Take 1 capsule (180 mg total) by mouth daily.  Marland Kitchen LORazepam (ATIVAN) 2 MG tablet Take 2 mg by mouth daily as needed for anxiety.  . Magnesium 200 MG TABS Take 1 tablet by mouth daily.  . Multiple Vitamin (MULTIVITAMIN) capsule Take 1 capsule by mouth daily.  . Omega-3 Fatty Acids (FISH OIL PO) Take 1 tablet by mouth daily.  . simvastatin (ZOCOR) 40 MG tablet Take 20 mg by mouth Daily.   . vitamin C (ASCORBIC ACID) 500 MG tablet Take 1,000 mg by mouth daily.  . citalopram (CELEXA) 20 MG tablet Take 20 mg by mouth daily.  . finasteride (PROSCAR) 5 MG tablet 1/2 tab po qd     Review of Systems  Constitutional: Negative.   HENT: Negative.   Eyes: Negative.   Respiratory: Negative.   Cardiovascular: Negative.   Gastrointestinal: Negative.   Endocrine: Negative.   Musculoskeletal: Negative.   Skin: Negative.   Allergic/Immunologic: Negative.   Neurological: Negative.   Hematological: Negative.   Psychiatric/Behavioral: Negative.   All other systems reviewed and are negative.   BP 124/76  Pulse 52  Ht 5\' 10"  (1.778 m)  Wt 185 lb 8 oz (84.142 kg)  BMI 26.62 kg/m2  Physical Exam  Nursing note and vitals reviewed. Constitutional: He is oriented to person, place, and time. He appears well-developed and well-nourished.  HENT:  Head: Normocephalic.  Nose: Nose normal.  Mouth/Throat: Oropharynx is clear and moist.  Eyes: Conjunctivae are normal. Pupils are equal, round, and reactive to light.  Neck: Normal  range of motion. Neck supple. No JVD present.  Cardiovascular: Normal rate, regular rhythm, S1 normal, S2 normal, normal heart sounds and intact distal pulses.  Exam reveals no gallop and no friction rub.   No murmur heard. Pulmonary/Chest: Effort normal and breath sounds normal. No respiratory distress. He has no wheezes. He has no rales. He exhibits no tenderness.  Abdominal: Soft. Bowel sounds are normal. He exhibits no distension. There is no tenderness.   Musculoskeletal: Normal range of motion. He exhibits no edema and no tenderness.  Lymphadenopathy:    He has no cervical adenopathy.  Neurological: He is alert and oriented to person, place, and time. Coordination normal.  Skin: Skin is warm and dry. No rash noted. No erythema.  Psychiatric: He has a normal mood and affect. His behavior is normal. Judgment and thought content normal.      Assessment and Plan

## 2014-02-11 NOTE — Assessment & Plan Note (Signed)
We had a long discussion about his recent events including the rectus sheath hematoma. He continues to have pain. Suggested he followup with primary care and the CT scans to determine if there is anything surgically that can be done. He has radiating pain sometimes when he eats on the left. From a cardiac perspective, he would be a acceptable risk for any surgery.

## 2014-02-11 NOTE — Assessment & Plan Note (Signed)
Rare atrial fibrillation. No medication changes made. He is off warfarin secondary to recent hematoma

## 2014-02-11 NOTE — Patient Instructions (Signed)
You are doing well. No medication changes were made.  Please call us if you have new issues that need to be addressed before your next appt.  Your physician wants you to follow-up in: 6 months.  You will receive a reminder letter in the mail two months in advance. If you don't receive a letter, please call our office to schedule the follow-up appointment.   

## 2014-02-16 DIAGNOSIS — I2510 Atherosclerotic heart disease of native coronary artery without angina pectoris: Secondary | ICD-10-CM | POA: Insufficient documentation

## 2014-02-16 DIAGNOSIS — K5790 Diverticulosis of intestine, part unspecified, without perforation or abscess without bleeding: Secondary | ICD-10-CM | POA: Insufficient documentation

## 2014-02-16 DIAGNOSIS — F325 Major depressive disorder, single episode, in full remission: Secondary | ICD-10-CM | POA: Insufficient documentation

## 2014-02-17 ENCOUNTER — Encounter: Payer: Self-pay | Admitting: Internal Medicine

## 2014-02-17 ENCOUNTER — Ambulatory Visit (INDEPENDENT_AMBULATORY_CARE_PROVIDER_SITE_OTHER): Payer: Medicare Other | Admitting: Internal Medicine

## 2014-02-17 VITALS — BP 126/82 | HR 64 | Temp 98.4°F | Resp 16 | Ht 70.0 in | Wt 188.4 lb

## 2014-02-17 DIAGNOSIS — R7309 Other abnormal glucose: Secondary | ICD-10-CM

## 2014-02-17 DIAGNOSIS — I10 Essential (primary) hypertension: Secondary | ICD-10-CM

## 2014-02-17 DIAGNOSIS — R7303 Prediabetes: Secondary | ICD-10-CM

## 2014-02-17 DIAGNOSIS — E782 Mixed hyperlipidemia: Secondary | ICD-10-CM

## 2014-02-17 DIAGNOSIS — E559 Vitamin D deficiency, unspecified: Secondary | ICD-10-CM

## 2014-02-17 DIAGNOSIS — Z79899 Other long term (current) drug therapy: Secondary | ICD-10-CM

## 2014-02-17 LAB — CBC WITH DIFFERENTIAL/PLATELET
BASOS ABS: 0 10*3/uL (ref 0.0–0.1)
BASOS PCT: 0 % (ref 0–1)
Eosinophils Absolute: 0.3 10*3/uL (ref 0.0–0.7)
Eosinophils Relative: 3 % (ref 0–5)
HCT: 46 % (ref 39.0–52.0)
Hemoglobin: 16.1 g/dL (ref 13.0–17.0)
Lymphocytes Relative: 21 % (ref 12–46)
Lymphs Abs: 2.3 10*3/uL (ref 0.7–4.0)
MCH: 31.2 pg (ref 26.0–34.0)
MCHC: 35 g/dL (ref 30.0–36.0)
MCV: 89.1 fL (ref 78.0–100.0)
MONO ABS: 0.9 10*3/uL (ref 0.1–1.0)
Monocytes Relative: 8 % (ref 3–12)
NEUTROS ABS: 7.5 10*3/uL (ref 1.7–7.7)
NEUTROS PCT: 68 % (ref 43–77)
PLATELETS: 222 10*3/uL (ref 150–400)
RBC: 5.16 MIL/uL (ref 4.22–5.81)
RDW: 13.6 % (ref 11.5–15.5)
WBC: 11 10*3/uL — AB (ref 4.0–10.5)

## 2014-02-17 LAB — HEMOGLOBIN A1C
Hgb A1c MFr Bld: 6.1 % — ABNORMAL HIGH (ref <5.7)
MEAN PLASMA GLUCOSE: 128 mg/dL — AB (ref <117)

## 2014-02-17 MED ORDER — FUROSEMIDE 40 MG PO TABS: ORAL_TABLET | ORAL | Status: DC

## 2014-02-17 NOTE — Progress Notes (Signed)
   Subjective:    Patient ID: [name] Yu, male    DOB: 1926-12-30, 80 y.o.   MRN: [id number]  HPI patient returns for 2 week F/U of Rectus Abdominus sheath hematoma. Patient had coumadin discontinued at [facility] and he was restarted on Diltiazem 180 mg her as prophylaxis for his Hx/o pAfib. He now presents with a 1-2 week Hx/o dependent edema.  Meds, Allergies, SH, Fh otherwise unchanged. He had stopped his Citalopram, Finasteride & simvastatin.  Review of Systems In addition to the HPI above,  No Fever-chills,  No Headache, No changes with Vision or hearing,  No problems swallowing food or Liquids,  No Chest pain or productive Cough or Shortness of Breath,  No Abdominal pain, No Nausea or Vommitting, Bowel movements are regular,  No Blood in stool or Urine,  No dysuria,   No new skin rashes or bruises,  No new joints pains-aches,  No new weakness, tingling, numbness in any extremity,  No recent weight loss,  No polyuria, polydypsia or polyphagia,  No significant Mental Stressors, but wife reports he has been more irritable since he self discontinued his citalopram about 1 week ago. A full 10 point Review of Systems was done, except as stated above, all other Review of Systems were negative  Objective:   Physical Exam BP 126/82  Pulse 64  Temp(Src) 98.4 F (36.9 C) (Temporal)  Resp 16  Ht 5\' 10"  (1.778 m)  Wt 188 lb 6.4 oz (85.458 kg)  BMI 27.03 kg/m2  HEENT - Eac's patent. TM's Nl.EOM's full. PERRLA. NasoOroPharynx clear. Neck - supple. Nl Thyroid. No bruits nodes JVD Chest - Clear equal BS Cor - Nl HS. RRR w/o sig MGR. PP 1(+) with 1-2 (+) pretibial edema. Abd - Neg MS- FROM. w/o deformities. Muscle power tone and bulk Nl. Gait Nl. Neuro - No obvious Cr N abnormalities. Sensory, motor and Cerebellar functions appear Nl w/o focal abnormalities.  Assessment & Plan:   1- HT - -change Diltiazem 180 SR back to 60 mg bid ~ q12h  2- Edema - Low salt diet - Elevation when  able - Rx Lasix 40 mg to take 1 to 2 per day until edema resolves - Restart Citalopram, Finasteride & Simvastatin. - ROV 2 weeks

## 2014-02-17 NOTE — Patient Instructions (Signed)
Stop Diltiazem  180 mg  Re start Diltiazem 60 mg 2 x day at breakfast & supper

## 2014-02-18 LAB — BASIC METABOLIC PANEL WITH GFR
BUN: 9 mg/dL (ref 6–23)
CO2: 24 mEq/L (ref 19–32)
Calcium: 9.9 mg/dL (ref 8.4–10.5)
Chloride: 103 mEq/L (ref 96–112)
Creat: 0.8 mg/dL (ref 0.50–1.35)
GFR, EST NON AFRICAN AMERICAN: 81 mL/min
Glucose, Bld: 78 mg/dL (ref 70–99)
POTASSIUM: 4.1 meq/L (ref 3.5–5.3)
SODIUM: 140 meq/L (ref 135–145)

## 2014-02-18 LAB — HEPATIC FUNCTION PANEL
ALT: 12 U/L (ref 0–53)
AST: 19 U/L (ref 0–37)
Albumin: 4.2 g/dL (ref 3.5–5.2)
Alkaline Phosphatase: 62 U/L (ref 39–117)
BILIRUBIN INDIRECT: 0.4 mg/dL (ref 0.2–1.2)
BILIRUBIN TOTAL: 0.5 mg/dL (ref 0.2–1.2)
Bilirubin, Direct: 0.1 mg/dL (ref 0.0–0.3)
TOTAL PROTEIN: 6.6 g/dL (ref 6.0–8.3)

## 2014-02-18 LAB — LIPID PANEL
CHOLESTEROL: 174 mg/dL (ref 0–200)
HDL: 52 mg/dL (ref >39)
LDL Cholesterol: 95 mg/dL (ref 0–99)
TRIGLYCERIDES: 135 mg/dL (ref <150)
Total CHOL/HDL Ratio: 3.3 Ratio
VLDL: 27 mg/dL (ref 0–40)

## 2014-02-18 LAB — VITAMIN D 25 HYDROXY (VIT D DEFICIENCY, FRACTURES): Vit D, 25-Hydroxy: 72 ng/mL (ref 30–89)

## 2014-02-18 LAB — INSULIN, FASTING: INSULIN FASTING, SERUM: 12 u[IU]/mL (ref 3–28)

## 2014-02-18 LAB — MAGNESIUM: Magnesium: 2 mg/dL (ref 1.5–2.5)

## 2014-02-18 LAB — TSH: TSH: 1.553 u[IU]/mL (ref 0.350–4.500)

## 2014-02-19 ENCOUNTER — Encounter: Payer: Self-pay | Admitting: Internal Medicine

## 2014-02-19 NOTE — Telephone Encounter (Signed)
Returned call to patient and advised him that the labs have not yet been released and when Dr Melford Aase reviews he will release to Mychart for him, probably will be Monday.

## 2014-03-02 ENCOUNTER — Ambulatory Visit: Payer: Self-pay | Admitting: Emergency Medicine

## 2014-03-04 ENCOUNTER — Encounter: Payer: Self-pay | Admitting: Internal Medicine

## 2014-03-04 ENCOUNTER — Ambulatory Visit (INDEPENDENT_AMBULATORY_CARE_PROVIDER_SITE_OTHER): Payer: Medicare Other | Admitting: Internal Medicine

## 2014-03-04 VITALS — BP 106/74 | HR 76 | Temp 97.5°F | Resp 18 | Ht 70.0 in | Wt 186.0 lb

## 2014-03-04 DIAGNOSIS — E559 Vitamin D deficiency, unspecified: Secondary | ICD-10-CM

## 2014-03-04 DIAGNOSIS — Z79899 Other long term (current) drug therapy: Secondary | ICD-10-CM

## 2014-03-04 DIAGNOSIS — R609 Edema, unspecified: Secondary | ICD-10-CM

## 2014-03-04 DIAGNOSIS — I10 Essential (primary) hypertension: Secondary | ICD-10-CM

## 2014-03-04 LAB — CBC WITH DIFFERENTIAL/PLATELET
Basophils Absolute: 0 10*3/uL (ref 0.0–0.1)
Basophils Relative: 0 % (ref 0–1)
Eosinophils Absolute: 0.2 10*3/uL (ref 0.0–0.7)
Eosinophils Relative: 2 % (ref 0–5)
HCT: 45.2 % (ref 39.0–52.0)
Hemoglobin: 15.6 g/dL (ref 13.0–17.0)
LYMPHS ABS: 2 10*3/uL (ref 0.7–4.0)
LYMPHS PCT: 22 % (ref 12–46)
MCH: 30.6 pg (ref 26.0–34.0)
MCHC: 34.5 g/dL (ref 30.0–36.0)
MCV: 88.8 fL (ref 78.0–100.0)
Monocytes Absolute: 0.7 10*3/uL (ref 0.1–1.0)
Monocytes Relative: 8 % (ref 3–12)
NEUTROS PCT: 68 % (ref 43–77)
Neutro Abs: 6.1 10*3/uL (ref 1.7–7.7)
PLATELETS: 222 10*3/uL (ref 150–400)
RBC: 5.09 MIL/uL (ref 4.22–5.81)
RDW: 13.1 % (ref 11.5–15.5)
WBC: 9 10*3/uL (ref 4.0–10.5)

## 2014-03-04 LAB — BASIC METABOLIC PANEL WITH GFR
BUN: 18 mg/dL (ref 6–23)
CHLORIDE: 102 meq/L (ref 96–112)
CO2: 29 meq/L (ref 19–32)
Calcium: 9.5 mg/dL (ref 8.4–10.5)
Creat: 1.36 mg/dL — ABNORMAL HIGH (ref 0.50–1.35)
GFR, Est African American: 54 mL/min — ABNORMAL LOW
GFR, Est Non African American: 47 mL/min — ABNORMAL LOW
Glucose, Bld: 89 mg/dL (ref 70–99)
Potassium: 4.2 mEq/L (ref 3.5–5.3)
SODIUM: 141 meq/L (ref 135–145)

## 2014-03-04 MED ORDER — LORAZEPAM 2 MG PO TABS: ORAL_TABLET | ORAL | Status: DC

## 2014-03-04 MED ORDER — DILTIAZEM HCL 60 MG PO TABS: 60.0000 mg | ORAL_TABLET | Freq: Four times a day (QID) | ORAL | Status: DC | PRN

## 2014-03-04 NOTE — Progress Notes (Deleted)
Patient ID: VIC ESCO, male   DOB: 1927-02-10, 80 y.o.   MRN: [id number]

## 2014-03-04 NOTE — Patient Instructions (Signed)
Sodium-Controlled Diet Sodium is a mineral. It is found in many foods. Sodium may be found naturally or added during the making of a food. The most common form of sodium is salt, which is made up of sodium and chloride. Reducing your sodium intake involves changing your eating habits. The following guidelines will help you reduce the sodium in your diet:  Stop using the salt shaker.  Use salt sparingly in cooking and baking.  Substitute with sodium-free seasonings and spices.  Do not use a salt substitute (potassium chloride) without your caregiver's permission.  Include a variety of fresh, unprocessed foods in your diet.  Limit the use of processed and convenience foods that are high in sodium. USE THE FOLLOWING FOODS SPARINGLY: Breads/Starches  Commercial bread stuffing, commercial pancake or waffle mixes, coating mixes. Waffles. Croutons. Prepared (boxed or frozen) potato, rice, or noodle mixes that contain salt or sodium. Salted French fries or hash browns. Salted popcorn, breads, crackers, chips, or snack foods. Vegetables  Vegetables canned with salt or prepared in cream, butter, or cheese sauces. Sauerkraut. Tomato or vegetable juices canned with salt.  Fresh vegetables are allowed if rinsed thoroughly. Fruit  Fruit is okay to eat. Meat and Meat Substitutes  Salted or smoked meats, such as bacon or Canadian bacon, chipped or corned beef, hot dogs, salt pork, luncheon meats, pastrami, ham, or sausage. Canned or smoked fish, poultry, or meat. Processed cheese or cheese spreads, blue or Roquefort cheese. Battered or frozen fish products. Prepared spaghetti sauce. Baked beans. Reuben sandwiches. Salted nuts. Caviar. Milk  Limit buttermilk to 1 cup per week. Soups and Combination Foods  Bouillon cubes, canned or dried soups, broth, consomm. Convenience (frozen or packaged) dinners with more than 600 mg sodium. Pot pies, pizza, Asian food, fast food cheeseburgers, and specialty  sandwiches. Desserts and Sweets  Regular (salted) desserts, pie, commercial fruit snack pies, commercial snack cakes, canned puddings.  Eat desserts and sweets in moderation. Fats and Oils  Gravy mixes or canned gravy. No more than 1 to 2 tbs of salad dressing. Chip dips.  Eat fats and oils in moderation. Beverages  See those listed under the vegetables and milk groups. Condiments  Ketchup, mustard, meat sauces, salsa, regular (salted) and lite soy sauce or mustard. Dill pickles, olives, meat tenderizer. Prepared horseradish or pickle relish. Dutch-processed cocoa. Baking powder or baking soda used medicinally. Worcestershire sauce. "Light" salt. Salt substitute, unless approved by your caregiver. Document Released: 03/23/2002 Document Revised: 12/24/2011 Document Reviewed: 10/24/2009 [employer] Patient Information 2014 [employer].  

## 2014-03-04 NOTE — Progress Notes (Signed)
Subjective:    Patient ID: [name] Yu, male    DOB: 07/26/1927, 80 y.o.   MRN: [id number]  HPI Patient has Hx/ o HTN and pAfib on warfarin x years until 1 month ago he had a unprovoked large rectus abdominus sheath hematoma and his warfarin was stopped by the cardiologists at [facility]. Then he was started on maintenance Diltiazem as prophylaxis for Afib for 2 weeks  and then returned 2 weeks ago with new onset dependent edema. He had lasix 40 mg added to his regimen.  He returns today feeling much improved over previous visits and cardiac systems review is negative.    Medication List       Cholecalciferol 5000 UNITS Tabs  Take 1 tablet by mouth daily.     citalopram 20 MG tablet  Commonly known as:  CELEXA  Take 20 mg by mouth daily.     diltiazem 60 MG tablet  Commonly known as:  CARDIZEM  Take 1 tablet (60 mg total) by mouth 4 (four) times daily as needed.     finasteride 5 MG tablet  Commonly known as:  PROSCAR  1/2 tab po qd     FISH OIL PO  Take 1 tablet by mouth daily.     furosemide 40 MG tablet  Commonly known as:  LASIX  Take 1 tablet 1 or 2 X day for fluid retention & ankle swelling     LORazepam 2 MG tablet  Commonly known as:  ATIVAN  Take 1/2 to 1 tab 3 x day as directed     Magnesium 200 MG Tabs  Take 1 tablet by mouth daily.     multivitamin capsule  Take 1 capsule by mouth daily.     TUMS E-X PO  Take 2 tablets by mouth daily.     vitamin C 500 MG tablet  Commonly known as:  ASCORBIC ACID  Take 1,000 mg by mouth daily.     Allergies  Allergen Reactions  . Ciprofloxacin     dysphoria  . Novocain [Procaine Hcl]   . Prednisone     Nervousness   Past Medical History  Diagnosis Date  . Hypertension   . Pre-diabetes   . Dyslipidemia   . OSA (obstructive sleep apnea)   . Atrial fib/flutter, transient     off coumadin since 11/2013- [facility]SVAC of 2  . Colon polyps     2003 hyperplastic and tubuler adenoma  . Diverticulosis   .  Rectus sheath hematoma 11/21/2013    Dr. Deatra Ina   . ASHD (arteriosclerotic heart disease)   . Depression    Past Surgical History  Procedure Laterality Date  . Hernia repair     Review of Systems In addition to the HPI above,  No Fever-chills,  No Headache, No changes with Vision or hearing,  No problems swallowing food or Liquids,  No Chest pain or productive Cough or Shortness of Breath,  No Abdominal pain, No Nausea or Vommitting, Bowel movements are regular,  No Blood in stool or Urine,  No dysuria,  No new skin rashes or bruises,  No new joints pains-aches,  No new weakness, tingling, numbness in any extremity,  No recent weight loss,  No polyuria, polydypsia or polyphagia,  No significant Mental Stressors.  A full 10 point Review of Systems was done, except as stated above, all other Review of Systems were negative  Objective:   Physical Exam  BP 106/74  Pulse 76  Temp 97.5 F  Resp 18  Ht 5\' 10"    Wt 186 lb   BMI 26.69 kg/m2  HEENT - Eac's patent. TM's Nl.EOM's full. PERRLA. NasoOroPharynx clear. Neck - supple. Nl Thyroid. No bruits nodes JVD Chest - Clear equal BS Cor - Nl HS. RRR w/o sig MGR. PP 1(+) pretibial / ankle  edema. Abd - No palpable organomegaly, masses or tenderness. BS nl. MS- FROM. w/o deformities. Muscle power tone and bulk Nl. Gait Nl. Neuro - No obvious Cr N abnormalities. Sensory, motor and Cerebellar functions appear Nl w/o focal abnormalities.   Assessment & Plan:   1. Hypertension - CBC with Differential - BASIC METABOLIC PANEL WITH GFR  2. Edema  3. pAfib  ROV - 36mo

## 2014-03-30 ENCOUNTER — Ambulatory Visit: Payer: Self-pay | Admitting: Physician Assistant

## 2014-04-08 ENCOUNTER — Telehealth: Payer: Self-pay

## 2014-04-08 NOTE — Telephone Encounter (Signed)
Patient cannot remember if you told him to stop taking Aspirin or if you told him to continue. Please advise.

## 2014-04-08 NOTE — Telephone Encounter (Signed)
OK to restart low dose ASA 81 mg

## 2014-04-09 NOTE — Telephone Encounter (Signed)
Patient aware.

## 2014-05-05 ENCOUNTER — Other Ambulatory Visit: Payer: Self-pay | Admitting: Internal Medicine

## 2014-05-31 ENCOUNTER — Telehealth: Payer: Self-pay | Admitting: *Deleted

## 2014-05-31 NOTE — Telephone Encounter (Signed)
Patient called and states he is having vertigo and requested the name of an OTC med to use.  Per Dr Melford Aase, can try Bonine. Patient aware.

## 2014-06-09 ENCOUNTER — Encounter: Payer: Self-pay | Admitting: Internal Medicine

## 2014-06-15 ENCOUNTER — Encounter: Payer: Self-pay | Admitting: Internal Medicine

## 2014-06-28 ENCOUNTER — Other Ambulatory Visit: Payer: Self-pay | Admitting: Internal Medicine

## 2014-07-06 ENCOUNTER — Ambulatory Visit (INDEPENDENT_AMBULATORY_CARE_PROVIDER_SITE_OTHER): Payer: Medicare Other | Admitting: Internal Medicine

## 2014-07-06 ENCOUNTER — Other Ambulatory Visit: Payer: Self-pay | Admitting: Internal Medicine

## 2014-07-06 ENCOUNTER — Encounter: Payer: Self-pay | Admitting: Internal Medicine

## 2014-07-06 VITALS — BP 110/80 | HR 64 | Temp 98.4°F | Resp 16 | Ht 70.0 in | Wt 190.6 lb

## 2014-07-06 DIAGNOSIS — E782 Mixed hyperlipidemia: Secondary | ICD-10-CM

## 2014-07-06 DIAGNOSIS — E559 Vitamin D deficiency, unspecified: Secondary | ICD-10-CM

## 2014-07-06 DIAGNOSIS — Z1212 Encounter for screening for malignant neoplasm of rectum: Secondary | ICD-10-CM

## 2014-07-06 DIAGNOSIS — Z125 Encounter for screening for malignant neoplasm of prostate: Secondary | ICD-10-CM

## 2014-07-06 DIAGNOSIS — Z0000 Encounter for general adult medical examination without abnormal findings: Secondary | ICD-10-CM

## 2014-07-06 DIAGNOSIS — Z789 Other specified health status: Secondary | ICD-10-CM

## 2014-07-06 DIAGNOSIS — Z7901 Long term (current) use of anticoagulants: Secondary | ICD-10-CM

## 2014-07-06 DIAGNOSIS — Z23 Encounter for immunization: Secondary | ICD-10-CM

## 2014-07-06 DIAGNOSIS — I10 Essential (primary) hypertension: Secondary | ICD-10-CM

## 2014-07-06 DIAGNOSIS — Z1331 Encounter for screening for depression: Secondary | ICD-10-CM

## 2014-07-06 DIAGNOSIS — Z79899 Other long term (current) drug therapy: Secondary | ICD-10-CM

## 2014-07-06 DIAGNOSIS — R7309 Other abnormal glucose: Secondary | ICD-10-CM

## 2014-07-06 LAB — CBC WITH DIFFERENTIAL/PLATELET
BASOS ABS: 0 10*3/uL (ref 0.0–0.1)
Basophils Relative: 0 % (ref 0–1)
EOS ABS: 0.2 10*3/uL (ref 0.0–0.7)
EOS PCT: 3 % (ref 0–5)
HCT: 43.4 % (ref 39.0–52.0)
Hemoglobin: 15.5 g/dL (ref 13.0–17.0)
LYMPHS ABS: 1.7 10*3/uL (ref 0.7–4.0)
Lymphocytes Relative: 22 % (ref 12–46)
MCH: 31.6 pg (ref 26.0–34.0)
MCHC: 35.7 g/dL (ref 30.0–36.0)
MCV: 88.6 fL (ref 78.0–100.0)
Monocytes Absolute: 0.7 10*3/uL (ref 0.1–1.0)
Monocytes Relative: 9 % (ref 3–12)
Neutro Abs: 5 10*3/uL (ref 1.7–7.7)
Neutrophils Relative %: 66 % (ref 43–77)
PLATELETS: 206 10*3/uL (ref 150–400)
RBC: 4.9 MIL/uL (ref 4.22–5.81)
RDW: 13.7 % (ref 11.5–15.5)
WBC: 7.6 10*3/uL (ref 4.0–10.5)

## 2014-07-06 NOTE — Patient Instructions (Signed)
Recommend the book "The END of DIETING" by Dr Baker Janus   and the book "The END of DIABETES " by Dr Excell Seltzer  At [email or web address] - get book & Audio CD's      Being diabetic has a  300% increased risk for heart attack, stroke, cancer, and alzheimer- type vascular dementia. It is very important that you work harder with diet by avoiding all foods that are white except chicken & fish. Avoid white rice (brown & wild rice is OK), white potatoes (sweetpotatoes in moderation is OK), White bread or wheat bread or anything made out of white flour like bagels, donuts, rolls, buns, biscuits, cakes, pastries, cookies, pizza crust, and pasta (made from white flour & egg whites) - vegetarian pasta or spinach or wheat pasta is OK. Multigrain breads like Arnold's or [employer], or multigrain sandwich thins or flatbreads.  Diet, exercise and weight loss can reverse and cure diabetes in the early stages.  Diet, exercise and weight loss is very important in the control and prevention of complications of diabetes which affects every system in your body, ie. Brain - dementia/stroke, eyes - glaucoma/blindness, heart - heart attack/heart failure, kidneys - dialysis, stomach - gastric paralysis, intestines - malabsorption, nerves - severe painful neuritis, circulation - gangrene & loss of a leg(s), and finally cancer and Alzheimers.    I recommend avoid fried & greasy foods,  sweets/candy, white rice (brown or wild rice or Quinoa is OK), white potatoes (sweet potatoes are OK) - anything made from white flour - bagels, doughnuts, rolls, buns, biscuits,white and wheat breads, pizza crust and traditional pasta made of white flour & egg white(vegetarian pasta or spinach or wheat pasta is OK).  Multi-grain bread is OK - like multi-grain flat bread or sandwich thins. Avoid alcohol in excess. Exercise is also important.    Eat all the vegetables you want - avoid meat, especially red meat and dairy - especially cheese.  Cheese  is the most concentrated form of trans-fats which is the worst thing to clog up our arteries. Veggie cheese is OK which can be found in the fresh produce section at [employer] or [employer] or [employer]  Preventive Care for Adults A healthy lifestyle and preventive care can promote health and wellness. Preventive health guidelines for men include the following key practices:  A routine yearly physical is a good way to check with your health care provider about your health and preventative screening. It is a chance to share any concerns and updates on your health and to receive a thorough exam.  Visit your dentist for a routine exam and preventative care every 6 months. Brush your teeth twice a day and floss once a day. Good oral hygiene prevents tooth decay and gum disease.  The frequency of eye exams is based on your age, health, family medical history, use of contact lenses, and other factors. Follow your health care provider's recommendations for frequency of eye exams.  Eat a healthy diet. Foods such as vegetables, fruits, whole grains, low-fat dairy products, and lean protein foods contain the nutrients you need without too many calories. Decrease your intake of foods high in solid fats, added sugars, and salt. Eat the right amount of calories for you.Get information about a proper diet from your health care provider, if necessary.  Regular physical exercise is one of the most important things you can do for your health. Most adults should get at least 150 minutes of moderate-intensity exercise (any activity that  increases your heart rate and causes you to sweat) each week. In addition, most adults need muscle-strengthening exercises on 2 or more days a week.  Maintain a healthy weight. The body mass index (BMI) is a screening tool to identify possible weight problems. It provides an estimate of body fat based on height and weight. Your health care provider can find your BMI and can help you  achieve or maintain a healthy weight.For adults 20 years and older:  A BMI below 18.5 is considered underweight.  A BMI of 18.5 to 24.9 is normal.  A BMI of 25 to 29.9 is considered overweight.  A BMI of 30 and above is considered obese.  Maintain normal blood lipids and cholesterol levels by exercising and minimizing your intake of saturated fat. Eat a balanced diet with plenty of fruit and vegetables. Blood tests for lipids and cholesterol should begin at age 20 and be repeated every 5 years. If your lipid or cholesterol levels are high, you are over 50, or you are at high risk for heart disease, you may need your cholesterol levels checked more frequently.Ongoing high lipid and cholesterol levels should be treated with medicines if diet and exercise are not working.  If you smoke, find out from your health care provider how to quit. If you do not use tobacco, do not start.  Lung cancer screening is recommended for adults aged 72-80 years who are at high risk for developing lung cancer because of a history of smoking. A yearly low-dose CT scan of the lungs is recommended for people who have at least a 30-pack-year history of smoking and are a current smoker or have quit within the past 15 years. A pack year of smoking is smoking an average of 1 pack of cigarettes a day for 1 year (for example: 1 pack a day for 30 years or 2 packs a day for 15 years). Yearly screening should continue until the smoker has stopped smoking for at least 15 years. Yearly screening should be stopped for people who develop a health problem that would prevent them from having lung cancer treatment.  If you choose to drink alcohol, do not have more than 2 drinks per day. One drink is considered to be 12 ounces (355 mL) of beer, 5 ounces (148 mL) of wine, or 1.5 ounces (44 mL) of liquor.  Avoid use of street drugs. Do not share needles with anyone. Ask for help if you need support or instructions about stopping the use of  drugs.  High blood pressure causes heart disease and increases the risk of stroke. Your blood pressure should be checked at least every 1-2 years. Ongoing high blood pressure should be treated with medicines, if weight loss and exercise are not effective.  If you are 28-64 years old, ask your health care provider if you should take aspirin to prevent heart disease.  Diabetes screening involves taking a blood sample to check your fasting blood sugar level. This should be done once every 3 years, after age 13, if you are within normal weight and without risk factors for diabetes. Testing should be considered at a younger age or be carried out more frequently if you are overweight and have at least 1 risk factor for diabetes.  Colorectal cancer can be detected and often prevented. Most routine colorectal cancer screening begins at the age of 78 and continues through age 56. However, your health care provider may recommend screening at an earlier age if you have risk  factors for colon cancer. On a yearly basis, your health care provider may provide home test kits to check for hidden blood in the stool. Use of a small camera at the end of a tube to directly examine the colon (sigmoidoscopy or colonoscopy) can detect the earliest forms of colorectal cancer. Talk to your health care provider about this at age 48, when routine screening begins. Direct exam of the colon should be repeated every 5-10 years through age 60, unless early forms of precancerous polyps or small growths are found.  People who are at an increased risk for hepatitis B should be screened for this virus. You are considered at high risk for hepatitis B if:  You were born in a country where hepatitis B occurs often. Talk with your health care provider about which countries are considered high risk.  Your parents were born in a high-risk country and you have not received a shot to protect against hepatitis B (hepatitis B vaccine).  You have  HIV or AIDS.  You use needles to inject street drugs.  You live with, or have sex with, someone who has hepatitis B.  You are a man who has sex with other men (MSM).  You get hemodialysis treatment.  You take certain medicines for conditions such as cancer, organ transplantation, and autoimmune conditions.  Hepatitis C blood testing is recommended for all people born from [birth year] through 1965 and any individual with known risks for hepatitis C.  Practice safe sex. Use condoms and avoid high-risk sexual practices to reduce the spread of sexually transmitted infections (STIs). STIs include gonorrhea, chlamydia, syphilis, trichomonas, herpes, HPV, and human immunodeficiency virus (HIV). Herpes, HIV, and HPV are viral illnesses that have no cure. They can result in disability, cancer, and death.  If you are at risk of being infected with HIV, it is recommended that you take a prescription medicine daily to prevent HIV infection. This is called preexposure prophylaxis (PrEP). You are considered at risk if:  You are a man who has sex with other men (MSM) and have other risk factors.  You are a heterosexual man, are sexually active, and are at increased risk for HIV infection.  You take drugs by injection.  You are sexually active with a partner who has HIV.  Talk with your health care provider about whether you are at high risk of being infected with HIV. If you choose to begin PrEP, you should first be tested for HIV. You should then be tested every 3 months for as long as you are taking PrEP.  A one-time screening for abdominal aortic aneurysm (AAA) and surgical repair of large AAAs by ultrasound are recommended for men ages 51 to 11 years who are current or former smokers.  Healthy men should no longer receive prostate-specific antigen (PSA) blood tests as part of routine cancer screening. Talk with your health care provider about prostate cancer screening.  Testicular cancer screening is  not recommended for adult males who have no symptoms. Screening includes self-exam, a health care provider exam, and other screening tests. Consult with your health care provider about any symptoms you have or any concerns you have about testicular cancer.  Use sunscreen. Apply sunscreen liberally and repeatedly throughout the day. You should seek shade when your shadow is shorter than you. Protect yourself by wearing long sleeves, pants, a wide-brimmed hat, and sunglasses year round, whenever you are outdoors.  Once a month, do a whole-body skin exam, using a mirror to look  at the skin on your back. Tell your health care provider about new moles, moles that have irregular borders, moles that are larger than a pencil eraser, or moles that have changed in shape or color.  Stay current with required vaccines (immunizations).  Influenza vaccine. All adults should be immunized every year.  Tetanus, diphtheria, and acellular pertussis (Td, Tdap) vaccine. An adult who has not previously received Tdap or who does not know his vaccine status should receive 1 dose of Tdap. This initial dose should be followed by tetanus and diphtheria toxoids (Td) booster doses every 10 years. Adults with an unknown or incomplete history of completing a 3-dose immunization series with Td-containing vaccines should begin or complete a primary immunization series including a Tdap dose. Adults should receive a Td booster every 10 years.  Varicella vaccine. An adult without evidence of immunity to varicella should receive 2 doses or a second dose if he has previously received 1 dose.  Human papillomavirus (HPV) vaccine. Males aged 29-21 years who have not received the vaccine previously should receive the 3-dose series. Males aged 22-26 years may be immunized. Immunization is recommended through the age of 26 years for any male who has sex with males and did not get any or all doses earlier. Immunization is recommended for any  person with an immunocompromised condition through the age of 29 years if he did not get any or all doses earlier. During the 3-dose series, the second dose should be obtained 4-8 weeks after the first dose. The third dose should be obtained 24 weeks after the first dose and 16 weeks after the second dose.  Zoster vaccine. One dose is recommended for adults aged 38 years or older unless certain conditions are present.  Measles, mumps, and rubella (MMR) vaccine. Adults born before [birth year] generally are considered immune to measles and mumps. Adults born in [birth year] or later should have 1 or more doses of MMR vaccine unless there is a contraindication to the vaccine or there is laboratory evidence of immunity to each of the three diseases. A routine second dose of MMR vaccine should be obtained at least 28 days after the first dose for students attending postsecondary schools, health care workers, or international travelers. People who received inactivated measles vaccine or an unknown type of measles vaccine during 1963-1967 should receive 2 doses of MMR vaccine. People who received inactivated mumps vaccine or an unknown type of mumps vaccine before 1979 and are at high risk for mumps infection should consider immunization with 2 doses of MMR vaccine. Unvaccinated health care workers born before [birth year] who lack laboratory evidence of measles, mumps, or rubella immunity or laboratory confirmation of disease should consider measles and mumps immunization with 2 doses of MMR vaccine or rubella immunization with 1 dose of MMR vaccine.  Pneumococcal 13-valent conjugate (PCV13) vaccine. When indicated, a person who is uncertain of his immunization history and has no record of immunization should receive the PCV13 vaccine. An adult aged 68 years or older who has certain medical conditions and has not been previously immunized should receive 1 dose of PCV13 vaccine. This PCV13 should be followed with a dose of pneumococcal  polysaccharide (PPSV23) vaccine. The PPSV23 vaccine dose should be obtained at least 8 weeks after the dose of PCV13 vaccine. An adult aged 95 years or older who has certain medical conditions and previously received 1 or more doses of PPSV23 vaccine should receive 1 dose of PCV13. The PCV13 vaccine dose should be obtained 1  or more years after the last PPSV23 vaccine dose.  Pneumococcal polysaccharide (PPSV23) vaccine. When PCV13 is also indicated, PCV13 should be obtained first. All adults aged 40 years and older should be immunized. An adult younger than age 41 years who has certain medical conditions should be immunized. Any person who resides in a nursing home or long-term care facility should be immunized. An adult smoker should be immunized. People with an immunocompromised condition and certain other conditions should receive both PCV13 and PPSV23 vaccines. People with human immunodeficiency virus (HIV) infection should be immunized as soon as possible after diagnosis. Immunization during chemotherapy or radiation therapy should be avoided. Routine use of PPSV23 vaccine is not recommended for American Indians, [place] Natives, or people younger than 65 years unless there are medical conditions that require PPSV23 vaccine. When indicated, people who have unknown immunization and have no record of immunization should receive PPSV23 vaccine. One-time revaccination 5 years after the first dose of PPSV23 is recommended for people aged 19-64 years who have chronic kidney failure, nephrotic syndrome, asplenia, or immunocompromised conditions. People who received 1-2 doses of PPSV23 before age 47 years should receive another dose of PPSV23 vaccine at age 37 years or later if at least 5 years have passed since the previous dose. Doses of PPSV23 are not needed for people immunized with PPSV23 at or after age 68 years.  Meningococcal vaccine. Adults with asplenia or persistent complement component deficiencies  should receive 2 doses of quadrivalent meningococcal conjugate (MenACWY-D) vaccine. The doses should be obtained at least 2 months apart. Microbiologists working with certain meningococcal bacteria, [employer] recruits, people at risk during an outbreak, and people who travel to or live in countries with a high rate of meningitis should be immunized. A first-year college student up through age 49 years who is living in a residence hall should receive a dose if he did not receive a dose on or after his 16th birthday. Adults who have certain high-risk conditions should receive one or more doses of vaccine.  Hepatitis A vaccine. Adults who wish to be protected from this disease, have certain high-risk conditions, work with hepatitis A-infected animals, work in hepatitis A research labs, or travel to or work in countries with a high rate of hepatitis A should be immunized. Adults who were previously unvaccinated and who anticipate close contact with an international adoptee during the first 60 days after arrival in the [place] from a country with a high rate of hepatitis A should be immunized.  Hepatitis B vaccine. Adults should be immunized if they wish to be protected from this disease, have certain high-risk conditions, may be exposed to blood or other infectious body fluids, are household contacts or sex partners of hepatitis B positive people, are clients or workers in certain care facilities, or travel to or work in countries with a high rate of hepatitis B.  Haemophilus influenzae type b (Hib) vaccine. A previously unvaccinated person with asplenia or sickle cell disease or having a scheduled splenectomy should receive 1 dose of Hib vaccine. Regardless of previous immunization, a recipient of a hematopoietic stem cell transplant should receive a 3-dose series 6-12 months after his successful transplant. Hib vaccine is not recommended for adults with HIV infection. Preventive Service /  Frequency   Ages 42 and over  Blood pressure check.** / Every 1 to 2 years.  Lipid and cholesterol check.**/ Every 5 years beginning at age 66.  Lung cancer screening. / Every year if you are aged  55-80 years and have a 30-pack-year history of smoking and currently smoke or have quit within the past 15 years. Yearly screening is stopped once you have quit smoking for at least 15 years or develop a health problem that would prevent you from having lung cancer treatment.  Fecal occult blood test (FOBT) of stool. / Every year beginning at age 63 and continuing until age 74. You may not have to do this test if you get a colonoscopy every 10 years.  Flexible sigmoidoscopy** or colonoscopy.** / Every 5 years for a flexible sigmoidoscopy or every 10 years for a colonoscopy beginning at age 94 and continuing until age 46.  Hepatitis C blood test.** / For all people born from [birth year] through 1963 and any individual with known risks for hepatitis C.  Abdominal aortic aneurysm (AAA) screening for persons with hypertension or who are current or former smokers.  Skin self-exam. / Monthly.  Influenza vaccine. / Every year.  Tetanus, diphtheria, and acellular pertussis (Tdap/Td) vaccine.** / 1 dose of Td every 10 years.  Varicella vaccine.** / Consult your health care provider.  Zoster vaccine.** / 1 dose for adults aged 29 years or older.  Pneumococcal 13-valent conjugate (PCV13) vaccine.** / Consult your health care provider.  Pneumococcal polysaccharide (PPSV23) vaccine.** / 1 dose for all adults aged 81 years and older.  Meningococcal vaccine.** / Consult your health care provider.  Hepatitis A vaccine.** / Consult your health care provider.  Hepatitis B vaccine.** / Consult your health care provider.  Haemophilus influenzae type b (Hib) vaccine.** / Consult your health care provider.

## 2014-07-06 NOTE — Progress Notes (Signed)
Patient ID: [name] Yu, male   DOB: [birth year], 80 y.o.   MRN: [id number]  [facility] VISIT AND CPE  Assessment:   1. Routine general medical examination at a health care facility  2. Essential hypertension  - Microalbumin / creatinine urine ratio - EKG 12-Lead - [place], RETROPERITNL ABD,  LTD - TSH  3. Vitamin D Deficiency  - Vit D  25 hydroxy (rtn osteoporosis monitoring)  4. Screening for malignant neoplasm of the rectum  - POC Hemoccult Bld/Stl (3-Cd Home Screen); Future  5. Special screening for malignant neoplasm of prostate  - PSA  6. Encounter for long-term (current) use of other medications  - Urine Microscopic - CBC with Differential - BASIC METABOLIC PANEL WITH GFR - Hepatic function panel - Magnesium  7. Hx Atrial fibrillation  - off coumadin after large rectus sheath hematoma at therapeutic INR  8. Mixed hyperlipidemia  - Lipid panel  9. Other abnormal glucose  - Hemoglobin A1c - Insulin, fasting  10. Need for prophylactic vaccination with tetanus-diphtheria (TD)  - DT Vaccine greater than 7yo IM  11. Need for prophylactic vaccination against Streptococcus pneumoniae (pneumococcus)  - Pneumococcal conjugate vaccine 13-valent  Plan:   During the course of the visit the patient was educated and counseled about appropriate screening and preventive services including:    Pneumococcal vaccine   Influenza vaccine  Td vaccine  Screening electrocardiogram  Bone densitometry screening  Colorectal cancer screening  Diabetes screening  Glaucoma screening  Nutrition counseling   Advanced directives: requested  Screening recommendations, referrals: Vaccinations: DT vaccine 07/06/2014 Influenza vaccine undecided Pneumococcal vaccine 06/09/13 Prevnar Vaccine 07/06/2014 Shingles vaccine 10/15/2005 Hep B vaccine not indicated  Nutrition assessed and recommended  Colonoscopy 2003/deferred due to age Recommended yearly  ophthalmology/optometry visit for glaucoma screening and checkup Recommended yearly dental visit for hygiene and checkup Advanced directives - yes  Conditions/risks identified: BMI: Discussed weight loss, diet, and increase physical activity.  Increase physical activity: AHA recommends 150 minutes of physical activity a week.  Medications reviewed PreDiabetes is at goal, ACE/ARB therapy: No, Reason not on Ace Inhibitor/ARB therapy:  not indicated Urinary Incontinence is not an issue: discussed non pharmacology and pharmacology options.  Fall risk: low- discussed PT, home fall assessment, medications.    Subjective:  [name] Yu is a 80 y.o. male who presents for Medicare Annual Wellness Visit and complete physical.  Date of last medicare wellness visit is unknown.  He has had elevated blood pressure since 2005. His blood pressure has been controlled at home, today their BP is BP: 110/80 mmHg  Patient has history of  bradycardia on calcium channel blockers and beta blockers. He's been followed for Parox.Afib since 2007 and in Jan 2015 had an unprovoked large rectus sheath hematoma at therapeutic INR and at [facility] it was decided to stop his coumadin indefinitely and continue only bASA 81 mg. Since then he's had no significant atrial fibrillation episodes. He did have one suspected episode of palpitations suspect for atrial fibrillation and he took diltiazem , rested and seemed to go back to normal rhythm after several hours.  He had normal heart cath in 2003 and a normal stress test in 2005 and a 2sd normal stress test in 2007.   He does not workout. He denies chest pain, shortness of breath, dizziness.  He is on cholesterol medication and denies myalgias. His cholesterol is at goal. The cholesterol last visit was:   Lab Results  Component Value Date  CHOL 174 02/17/2014   HDL 52 02/17/2014   LDLCALC 95 02/17/2014   TRIG 135 02/17/2014   CHOLHDL 3.3 02/17/2014   He has had prediabetes for 1  years. He has not been working on diet and exercise for  prediabetes, and denies foot ulcerations, hyperglycemia, hypoglycemia , nausea, paresthesia of the feet, polydipsia, polyuria and visual disturbances. Last A1C in the office was:  Lab Results  Component Value Date   HGBA1C 6.1* 02/17/2014   Patient is on Vitamin D supplement.   Lab Results  Component Value Date   VD25OH 72 02/17/2014     Names of Other Physician/Practitioners you currently use: 1.  [facility] here for primary care 2. [facility] doctor, last visit 2015 3. Dr Eula Listen, dentist, last visit 2015  Patient Care Team: [name], MD as PCP - General (Internal Medicine) Benito Mccreedy, MD as Referring Physician (Family Medicine) Minna Merritts, MD as Consulting Physician (Cardiology)  Medication Review: Medication Sig  . Calcium Carbonate Antacid (TUMS E-X PO) Take 2 tablets by mouth daily.  . Cholecalciferol 5000 UNITS TABS Take 1 tablet by mouth daily.  . citalopram (CELEXA) 20 MG tablet Take 20 mg by mouth daily.  Marland Kitchen diltiazem (CARDIZEM) 60 MG tablet Take 1 tablet (60 mg total) by mouth 4 (four) times daily as needed.  . finasteride (PROSCAR) 5 MG tablet TAKE ONE TABLET BY MOUTH ONE TIME DAILY.  Marland Kitchen LORazepam (ATIVAN) 2 MG tablet Take 1/2 to 1 tab 3 x day as directed  . Magnesium 200 MG TABS Take 1 tablet by mouth daily.  . Multiple Vitamin (MULTIVITAMIN) capsule Take 1 capsule by mouth daily.  . Omega-3 Fatty Acids (FISH OIL PO) Take 1 tablet by mouth daily.  . simvastatin (ZOCOR) 40 MG tablet TAKE ONE TABLET BY MOUTH ONE TIME DAILY FOR CHLOSTEROL  . vitamin C (ASCORBIC ACID) 500 MG tablet Take 1,000 mg by mouth daily.   Current Problems (verified) Patient Active Problem List   Diagnosis Date Noted  . Vitamin D Deficiency 03/04/2014  . Encounter for long-term (current) use of other medications 02/17/2014  . ASHD (arteriosclerotic heart disease)   .  Depression   . Diverticulosis   . Rectus sheath hematoma 02/11/2014  . Personal history of colonic polyps 12/29/2012  . Anticoagulation adequate 06/30/2012  . Hypertension   . Pre-diabetes   . Dyslipidemia   . OSA (obstructive sleep apnea)   . Atrial fibrillation    Screening Tests Health Maintenance  Topic Date Due  . Tetanus/tdap  01/01/1946  . Colonoscopy  03/11/2012  . Influenza Vaccine  05/15/2014  . Pneumococcal Polysaccharide Vaccine Age 42 And Over  Completed  . Zostavax  Completed   Immunization History  Administered Date(s) Administered  . DT 07/06/2014  . Pneumococcal Conjugate-13 07/06/2014  . Pneumococcal Polysaccharide-23 06/09/2013  . Zoster 10/15/2005   Preventative care: Last colonoscopy: 2003  Prior vaccinations: TD : 07/06/2014  Influenza: deferred  Pneumococcal: 06/09/2013 Prevnar: 07/06/2014 Shingles/Zostavax: 10/15/2005  History reviewed: allergies, current medications, past family history, past medical history, past social history, past surgical history and problem list  Risk Factors: Tobacco History  Substance Use Topics  . Smoking status: Former Smoker    Quit date: 09/14/1993  . Smokeless tobacco: Never Used  . Alcohol Use: No   He does not smoke.  Patient is a former smoker. Are there smokers in your home (other than you)?  No  Alcohol Current alcohol use: none  Caffeine  Current caffeine use: denies use  Exercise Current exercise: walking  Nutrition/Diet Current diet: in general, a "healthy" diet    Cardiac risk factors: advanced age (older than 77 for men, 41 for women), dyslipidemia, hypertension, male gender and sedentary lifestyle.  Depression Screen (Note: if answer to either of the following is "Yes", a more complete depression screening is indicated)   Q1: Over the past two weeks, have you felt down, depressed or hopeless? No  Q2: Over the past two weeks, have you felt little interest or pleasure in doing things?  No  Have you lost interest or pleasure in daily life? No  Do you often feel hopeless? No  Do you cry easily over simple problems? No  Activities of Daily Living In your present state of health, do you have any difficulty performing the following activities?:  Driving? No Managing money?  No Feeding yourself? No Getting from bed to chair? No Climbing a flight of stairs? No Preparing food and eating?: No Bathing or showering? No Getting dressed: No Getting to the toilet? No Using the toilet:No Moving around from place to place: No In the past year have you fallen or had a near fall?:No   Are you sexually active?  No  Do you have more than one partner?  No  Vision Difficulties: No  Hearing Difficulties: No Do you often ask people to speak up or repeat themselves? No Do you experience ringing or noises in your ears? No Do you have difficulty understanding soft or whispered voices? No  Cognition  Do you feel that you have a problem with memory?No  Do you often misplace items? No  Do you feel safe at home?  Yes  Advanced directives Does patient have a [place]? Yes Does patient have a Living Will? Yes   Objective:     Blood pressure 110/80, pulse 64, temperature 98.4 F (36.9 C), temperature source Temporal, resp. rate 16, height 5\' 10"  (1.778 m), weight 190 lb 9.6 oz (86.456 kg). Body mass index is 27.35 kg/(m^2).  General appearance: alert, no distress, WD/WN, male Cognitive Testing  Alert? Yes  Normal Appearance?Yes  Oriented to person? Yes  Place? Yes   Time? Yes  Recall of three objects?  Yes  Can perform simple calculations? Yes  Displays appropriate judgment?Yes  Can read the correct time from a watch face?Yes  HEENT: normocephalic, sclerae anicteric, TMs pearly, nares patent, no discharge or erythema, pharynx normal Oral cavity: MMM, no lesions Neck: supple, no lymphadenopathy, no thyromegaly, no masses Heart: RRR, normal S1, S2, no  murmurs Lungs: CTA bilaterally, no wheezes, rhonchi, or rales Abdomen: +bs, soft, non tender, non distended, no masses, no hepatomegaly, no splenomegaly Musculoskeletal: nontender, no swelling, no obvious deformity Extremities: no edema, no cyanosis, no clubbing Pulses: 2+ symmetric, upper and lower extremities, normal cap refill Neurological: alert, oriented x 3, CN2-12 intact, strength normal upper extremities and lower extremities, sensation normal throughout, DTRs 2+ throughout, no cerebellar signs, gait normal Psychiatric: normal affect, behavior normal, pleasant   Medicare Attestation I have personally reviewed: The patient's medical and social history Their use of alcohol, tobacco or illicit drugs Their current medications and supplements The patient's functional ability including ADLs,fall risks, home safety risks, cognitive, and hearing and visual impairment Diet and physical activities Evidence for depression or mood disorders  The patient's weight, height, BMI, and visual acuity have been recorded in the chart.  I have made referrals, counseling, and provided education to the patient  based on review of the above and I have provided the patient with a written personalized care plan for preventive services.    Leilanny Fluitt [name], MD   07/06/2014

## 2014-07-07 LAB — HEPATIC FUNCTION PANEL
ALBUMIN: 4.2 g/dL (ref 3.5–5.2)
ALT: 11 U/L (ref 0–53)
AST: 17 U/L (ref 0–37)
Alkaline Phosphatase: 54 U/L (ref 39–117)
BILIRUBIN TOTAL: 0.5 mg/dL (ref 0.2–1.2)
Bilirubin, Direct: 0.1 mg/dL (ref 0.0–0.3)
Indirect Bilirubin: 0.4 mg/dL (ref 0.2–1.2)
Total Protein: 6.2 g/dL (ref 6.0–8.3)

## 2014-07-07 LAB — URINALYSIS, MICROSCOPIC ONLY
CRYSTALS: NONE SEEN
Casts: NONE SEEN
SQUAMOUS EPITHELIAL / LPF: NONE SEEN

## 2014-07-07 LAB — MICROALBUMIN / CREATININE URINE RATIO
Creatinine, Urine: 109.2 mg/dL
MICROALB/CREAT RATIO: 19.2 mg/g (ref 0.0–30.0)
Microalb, Ur: 2.1 mg/dL — ABNORMAL HIGH (ref <2.0)

## 2014-07-07 LAB — LIPID PANEL
Cholesterol: 123 mg/dL (ref 0–200)
HDL: 52 mg/dL (ref >39)
LDL Cholesterol: 56 mg/dL (ref 0–99)
Total CHOL/HDL Ratio: 2.4 Ratio
Triglycerides: 73 mg/dL (ref <150)
VLDL: 15 mg/dL (ref 0–40)

## 2014-07-07 LAB — BASIC METABOLIC PANEL WITH GFR
BUN: 11 mg/dL (ref 6–23)
CO2: 26 meq/L (ref 19–32)
Calcium: 9.6 mg/dL (ref 8.4–10.5)
Chloride: 105 mEq/L (ref 96–112)
Creat: 0.9 mg/dL (ref 0.50–1.35)
GFR, Est African American: >89 mL/min
GFR, Est Non African American: 78 mL/min
Glucose, Bld: 87 mg/dL (ref 70–99)
Potassium: 4.1 mEq/L (ref 3.5–5.3)
Sodium: 138 mEq/L (ref 135–145)

## 2014-07-07 LAB — PSA: PSA: 1.31 ng/mL (ref <=4.00)

## 2014-07-07 LAB — HEMOGLOBIN A1C
HEMOGLOBIN A1C: 5.6 % (ref <5.7)
Mean Plasma Glucose: 114 mg/dL (ref <117)

## 2014-07-07 LAB — MAGNESIUM: Magnesium: 2 mg/dL (ref 1.5–2.5)

## 2014-07-07 LAB — TSH: TSH: 1.485 u[IU]/mL (ref 0.350–4.500)

## 2014-07-07 LAB — VITAMIN D 25 HYDROXY (VIT D DEFICIENCY, FRACTURES): VIT D 25 HYDROXY: 71 ng/mL (ref 30–89)

## 2014-07-07 LAB — INSULIN, FASTING: Insulin fasting, serum: 3.9 u[IU]/mL (ref 2.0–19.6)

## 2014-07-10 LAB — URINE CULTURE: Colony Count: >=100000

## 2014-07-11 ENCOUNTER — Other Ambulatory Visit: Payer: Self-pay | Admitting: Internal Medicine

## 2014-07-11 MED ORDER — AMOXICILLIN 250 MG PO CAPS: 250.0000 mg | ORAL_CAPSULE | Freq: Three times a day (TID) | ORAL | Status: DC

## 2014-07-19 ENCOUNTER — Other Ambulatory Visit (INDEPENDENT_AMBULATORY_CARE_PROVIDER_SITE_OTHER): Payer: Medicare Other | Admitting: *Deleted

## 2014-07-19 DIAGNOSIS — Z1212 Encounter for screening for malignant neoplasm of rectum: Secondary | ICD-10-CM

## 2014-07-19 LAB — POC HEMOCCULT BLD/STL (HOME/3-CARD/SCREEN)
Card #2 Fecal Occult Blod, POC: NEGATIVE
FECAL OCCULT BLD: NEGATIVE
Fecal Occult Blood, POC: NEGATIVE

## 2014-07-26 ENCOUNTER — Other Ambulatory Visit: Payer: Self-pay | Admitting: Internal Medicine

## 2014-10-05 ENCOUNTER — Ambulatory Visit: Payer: Self-pay | Admitting: Physician Assistant

## 2014-10-20 ENCOUNTER — Ambulatory Visit (INDEPENDENT_AMBULATORY_CARE_PROVIDER_SITE_OTHER): Payer: Medicare Other | Admitting: Physician Assistant

## 2014-10-20 ENCOUNTER — Encounter: Payer: Self-pay | Admitting: Physician Assistant

## 2014-10-20 VITALS — BP 110/62 | HR 64 | Temp 98.1°F | Resp 16 | Ht 70.0 in | Wt 194.0 lb

## 2014-10-20 DIAGNOSIS — E785 Hyperlipidemia, unspecified: Secondary | ICD-10-CM | POA: Diagnosis not present

## 2014-10-20 DIAGNOSIS — R7303 Prediabetes: Secondary | ICD-10-CM

## 2014-10-20 DIAGNOSIS — I480 Paroxysmal atrial fibrillation: Secondary | ICD-10-CM

## 2014-10-20 DIAGNOSIS — R7309 Other abnormal glucose: Secondary | ICD-10-CM | POA: Diagnosis not present

## 2014-10-20 DIAGNOSIS — E559 Vitamin D deficiency, unspecified: Secondary | ICD-10-CM

## 2014-10-20 DIAGNOSIS — I10 Essential (primary) hypertension: Secondary | ICD-10-CM

## 2014-10-20 DIAGNOSIS — J209 Acute bronchitis, unspecified: Secondary | ICD-10-CM

## 2014-10-20 DIAGNOSIS — Z79899 Other long term (current) drug therapy: Secondary | ICD-10-CM | POA: Diagnosis not present

## 2014-10-20 LAB — BASIC METABOLIC PANEL WITH GFR
BUN: 15 mg/dL (ref 6–23)
CALCIUM: 9.4 mg/dL (ref 8.4–10.5)
CO2: 26 mEq/L (ref 19–32)
CREATININE: 0.73 mg/dL (ref 0.50–1.35)
Chloride: 103 mEq/L (ref 96–112)
GFR, Est African American: >89 mL/min
GFR, Est Non African American: 85 mL/min
Glucose, Bld: 91 mg/dL (ref 70–99)
POTASSIUM: 4.4 meq/L (ref 3.5–5.3)
SODIUM: 138 meq/L (ref 135–145)

## 2014-10-20 LAB — HEPATIC FUNCTION PANEL
ALT: 11 U/L (ref 0–53)
AST: 13 U/L (ref 0–37)
Albumin: 3.9 g/dL (ref 3.5–5.2)
Alkaline Phosphatase: 55 U/L (ref 39–117)
BILIRUBIN DIRECT: 0.1 mg/dL (ref 0.0–0.3)
BILIRUBIN TOTAL: 0.4 mg/dL (ref 0.2–1.2)
Indirect Bilirubin: 0.3 mg/dL (ref 0.2–1.2)
TOTAL PROTEIN: 6.2 g/dL (ref 6.0–8.3)

## 2014-10-20 LAB — CBC WITH DIFFERENTIAL/PLATELET
BASOS ABS: 0 10*3/uL (ref 0.0–0.1)
Basophils Relative: 0 % (ref 0–1)
EOS ABS: 0.2 10*3/uL (ref 0.0–0.7)
Eosinophils Relative: 3 % (ref 0–5)
HEMATOCRIT: 45.1 % (ref 39.0–52.0)
Hemoglobin: 15.5 g/dL (ref 13.0–17.0)
LYMPHS PCT: 24 % (ref 12–46)
Lymphs Abs: 2 10*3/uL (ref 0.7–4.0)
MCH: 32 pg (ref 26.0–34.0)
MCHC: 34.4 g/dL (ref 30.0–36.0)
MCV: 93 fL (ref 78.0–100.0)
MONO ABS: 0.7 10*3/uL (ref 0.1–1.0)
MPV: 9.1 fL (ref 8.6–12.4)
Monocytes Relative: 9 % (ref 3–12)
Neutro Abs: 5.3 10*3/uL (ref 1.7–7.7)
Neutrophils Relative %: 64 % (ref 43–77)
PLATELETS: 267 10*3/uL (ref 150–400)
RBC: 4.85 MIL/uL (ref 4.22–5.81)
RDW: 13.5 % (ref 11.5–15.5)
WBC: 8.3 10*3/uL (ref 4.0–10.5)

## 2014-10-20 LAB — LIPID PANEL
CHOL/HDL RATIO: 2.8 ratio
CHOLESTEROL: 121 mg/dL (ref 0–200)
HDL: 43 mg/dL (ref >39)
LDL CALC: 57 mg/dL (ref 0–99)
TRIGLYCERIDES: 103 mg/dL (ref <150)
VLDL: 21 mg/dL (ref 0–40)

## 2014-10-20 LAB — MAGNESIUM: Magnesium: 2 mg/dL (ref 1.5–2.5)

## 2014-10-20 LAB — TSH: TSH: 1.809 u[IU]/mL (ref 0.350–4.500)

## 2014-10-20 MED ORDER — LORAZEPAM 2 MG PO TABS: ORAL_TABLET | ORAL | Status: DC

## 2014-10-20 MED ORDER — PROMETHAZINE-CODEINE 6.25-10 MG/5ML PO SYRP: 5.0000 mL | ORAL_SOLUTION | Freq: Four times a day (QID) | ORAL | Status: DC | PRN

## 2014-10-20 MED ORDER — DILTIAZEM HCL 60 MG PO TABS: 60.0000 mg | ORAL_TABLET | Freq: Four times a day (QID) | ORAL | Status: DC | PRN

## 2014-10-20 MED ORDER — AZITHROMYCIN 250 MG PO TABS: ORAL_TABLET | ORAL | Status: DC

## 2014-10-20 NOTE — Patient Instructions (Addendum)
Rest and stay hydrated.  Make sure you drink plenty of fluids to make sure urine is clear when you urinate.  Water will help thin out mucous. - Take Mucinex DM- Maximum Strength over the counter to thin out and cough up the thick mucous.  Please follow directions on box. -Take Albuterol if prescribed.  I will give you a prescription for an antibiotic, but please only take it if you are not feeling better in 7-10 days.  Bronchitis is mostly caused by viruses and the antibiotic will do nothing.  Please call the office or message through My Chart if you have any questions.   Acute Bronchitis Bronchitis is when the airways that extend from the windpipe into the lungs get red, puffy, and painful (inflamed). Bronchitis often causes thick spit (mucus) to develop. This leads to a cough. A cough is the most common symptom of bronchitis. In acute bronchitis, the condition usually begins suddenly and goes away over time (usually in 2 weeks). Smoking, allergies, and asthma can make bronchitis worse. Repeated episodes of bronchitis may cause more lung problems.  Most common cause of Bronchitis is viruses (rhinovirus, coronavirus, RSV).  Therefore, not requiring an antibiotic; as antibiotics only treat bacterial infections.  HOME CARE  Rest.  Drink enough fluids to keep your pee (urine) clear or pale yellow (unless you need to limit fluids as told by your doctor).  Only take over-the-counter or prescription medicines as told by your doctor.  Avoid smoking and secondhand smoke. These can make bronchitis worse. If you are a smoker, think about using nicotine gum or skin patches. Quitting smoking will help your lungs heal faster.  Reduce the chance of getting bronchitis again by:  Washing your hands often.  Avoiding people with cold symptoms.  Trying not to touch your hands to your mouth, nose, or eyes.  Follow up with your doctor as told. GET HELP IF: Your symptoms do not improve after 1 week of  treatment. Symptoms include:  Cough.  Fever.  Coughing up thick spit.  Body aches.   Chest congestion.  Chills.  Shortness of breath.  Sore throat. GET HELP RIGHT AWAY IF:   You have an increased fever.  You have chills.  You have severe shortness of breath.  You have bloody thick spit (sputum). You throw up (vomit) often.Atrial Fibrillation Atrial fibrillation is a condition that causes your heart to beat irregularly. It may also cause your heart to beat faster than normal. Atrial fibrillation can prevent your heart from pumping blood normally. It increases your risk of stroke and heart problems. HOME CARE Take medications as told by your doctor. Only take medications that your doctor says are safe. Some medications can make the condition worse or happen again. If blood thinners were prescribed by your doctor, take them exactly as told. Too much can cause bleeding. Too little and you will not have the needed protection against stroke and other problems. Perform blood tests at home if told by your doctor. Perform blood tests exactly as told by your doctor. Do not drink alcohol. Do not drink beverages with caffeine such as coffee, soda, and some teas. Maintain a healthy weight. Do not use diet pills unless your doctor says they are safe. They may make heart problems worse. Follow diet instructions as told by your doctor. Exercise regularly as told by your doctor. Keep all follow-up appointments. GET HELP IF: You notice a change in the speed, rhythm, or strength of your heartbeat. You suddenly begin  peeing (urinating) more often. You get tired more easily when moving or exercising. GET HELP RIGHT AWAY IF:  You have chest or belly (abdominal) pain. You feel sick to your stomach (nauseous). You are short of breath. You suddenly have swollen feet and ankles. You feel dizzy. You face, arms, or legs feel numb or weak. There is a change in your vision or speech. MAKE SURE  YOU:  Understand these instructions. Will watch your condition. Will get help right away if you are not doing well or get worse. Document Released: 07/10/2008 Document Revised: 02/15/2014 Document Reviewed: 11/11/2012 [facility] Patient Information 2015 [place], [place]. This information is not intended to replace advice given to you by your health care provider. Make sure you discuss any questions you have with your health care provider.    You lose too much body fluid (dehydration).  You have a severe headache.  You faint. MAKE SURE YOU:   Understand these instructions.  Will watch your condition.  Will get help right away if you are not doing well or get worse. Document Released: 03/19/2008 Document Revised: 06/03/2013 Document Reviewed: 03/24/2013 [facility] Patient Information 2015 [place], [place]. This information is not intended to replace advice given to you by your health care provider. Make sure you discuss any questions you have with your health care provider.

## 2014-10-20 NOTE — Progress Notes (Signed)
Assessment and Plan:  Hypertension: Continue medication, monitor blood pressure at home. Continue DASH diet.  Reminder to go to the ER if any CP, SOB, nausea, dizziness, severe HA, changes vision/speech, left arm numbness and tingling, and jaw pain. Cholesterol: Continue diet and exercise. Check cholesterol.  Pre-diabetes-Continue diet and exercise. Check A1C Vitamin D Def- check level and continue medications.  Bronchitis- zpak with refill, continue albuterol at home, codeine cough syrup.   pAfib- he is on bASA, took off coumadin due to rectus sheath hematoma, NSR at this time  Continue diet and meds as discussed. Further disposition pending results of labs.  HPI 81 y.o. male  presents for 3 month follow up with hypertension, hyperlipidemia, prediabetes and vitamin D. His blood pressure has been controlled at home, today their BP is BP: 110/62 mmHg He does not workout. He denies chest pain, shortness of breath, dizziness.  He is on cholesterol medication,zocor 40 and denies myalgias. His cholesterol is at goal. The cholesterol last visit was:   Lab Results  Component Value Date   CHOL 123 07/06/2014   HDL 52 07/06/2014   LDLCALC 56 07/06/2014   TRIG 73 07/06/2014   CHOLHDL 2.4 07/06/2014  He has been working on diet and exercise for prediabetes, and denies paresthesia of the feet, polydipsia, polyuria and visual disturbances. Last A1C in the office was:  Lab Results  Component Value Date   HGBA1C 5.6 07/06/2014  Patient is on Vitamin D supplement.   Lab Results  Component Value Date   VD25OH 36 07/06/2014    Patient also complains  of symptoms of a URI. Symptoms include congestion, nasal congestion, post nasal drip, productive cough with  yellow colored sputum, sinus pressure and sore throat. Onset of symptoms was 8 days ago, and has been gradually worsening since that time. Treatment to date: cough suppressants, decongestants and inhaler.Denies PND, orthopnea, edema.    Current  Medications:  Current Outpatient Prescriptions on File Prior to Visit  Medication Sig Dispense Refill  . amoxicillin (AMOXIL) 250 MG capsule Take 1 capsule (250 mg total) by mouth 3 (three) times daily. After meals for prostate infection 42 capsule 0  . Calcium Carbonate Antacid (TUMS E-X PO) Take 2 tablets by mouth daily.    . Cholecalciferol 5000 UNITS TABS Take 1 tablet by mouth daily.    . citalopram (CELEXA) 20 MG tablet Take 20 mg by mouth daily.    . citalopram (CELEXA) 40 MG tablet TAKE ONE TABLET BY MOUTH ONE TIME DAILY. 90 tablet 99  . diltiazem (CARDIZEM) 60 MG tablet Take 1 tablet (60 mg total) by mouth 4 (four) times daily as needed. 120 tablet 99  . finasteride (PROSCAR) 5 MG tablet TAKE ONE TABLET BY MOUTH ONE TIME DAILY. 90 tablet 3  . LORazepam (ATIVAN) 2 MG tablet Take 1/2 to 1 tab 3 x day as directed 90 tablet 1  . Magnesium 200 MG TABS Take 1 tablet by mouth daily.    . Multiple Vitamin (MULTIVITAMIN) capsule Take 1 capsule by mouth daily.    . Omega-3 Fatty Acids (FISH OIL PO) Take 1 tablet by mouth daily.    . simvastatin (ZOCOR) 40 MG tablet TAKE ONE TABLET BY MOUTH ONE TIME DAILY FOR CHLOSTEROL 30 tablet PRN  . vitamin C (ASCORBIC ACID) 500 MG tablet Take 1,000 mg by mouth daily.     No current facility-administered medications on file prior to visit.   Medical History:  Past Medical History  Diagnosis Date  .  Hypertension   . Pre-diabetes   . Dyslipidemia   . OSA (obstructive sleep apnea)   . Atrial fib/flutter, transient     off coumadin since 11/2013- CHADSVAC of 2  . Colon polyps     2003 hyperplastic and tubuler adenoma  . Diverticulosis   . Rectus sheath hematoma 11/21/2013    Dr. Deatra Ina   . ASHD (arteriosclerotic heart disease)   . Depression    Allergies:  Allergies  Allergen Reactions  . Ciprofloxacin     dysphoria  . Novocain [Procaine Hcl]   . Prednisone     Nervousness     Review of Systems:  Review of Systems  Constitutional:  Negative.   HENT: Negative.   Eyes: Negative.   Respiratory: Positive for cough, sputum production and wheezing. Negative for hemoptysis and shortness of breath.   Cardiovascular: Negative.  Negative for chest pain and leg swelling.  Gastrointestinal: Negative.   Genitourinary: Negative.   Musculoskeletal: Negative.   Skin: Negative.   Neurological: Negative.   Psychiatric/Behavioral: Negative.      Family history- Review and unchanged Social history- Review and unchanged Physical Exam: BP 110/62 mmHg  Pulse 64  Temp(Src) 98.1 F (36.7 C)  Resp 16  Ht 5\' 10"  (1.778 m)  Wt 194 lb (87.998 kg)  BMI 27.84 kg/m2 Wt Readings from Last 3 Encounters:  10/20/14 194 lb (87.998 kg)  07/06/14 190 lb 9.6 oz (86.456 kg)  03/04/14 186 lb (84.369 kg)   General Appearance: Well nourished, in no apparent distress. Eyes: PERRLA, EOMs, conjunctiva no swelling or erythema Sinuses: + Frontal/maxillary tenderness ENT/Mouth: Ext aud canals clear, TMs without erythema, bulging. No erythema, swelling, or exudate on post pharynx.  Tonsils not swollen or erythematous. Hearing normal.  Neck: Supple, thyroid normal.  Respiratory: Respiratory effort normal, + diffuse wheezing worse RLL  without rales, rhonchi,  or stridor.  Cardio: RRR with no MRGs. Brisk peripheral pulses without edema.  Abdomen: Soft, + BS.  Non tender, no guarding, rebound, hernias, masses. Lymphatics: Non tender without lymphadenopathy.  Musculoskeletal: Full ROM, 5/5 strength, normal gait.  Skin: Warm, dry without rashes, lesions, ecchymosis.  Neuro: Cranial nerves intact. Normal muscle tone, no cerebellar symptoms. Sensation intact.  Psych: Awake and oriented X 3, normal affect, Insight and Judgment appropriate.    Vicie Mutters, PA-C 3:47 PM [facility]

## 2014-10-21 LAB — HEMOGLOBIN A1C
Hgb A1c MFr Bld: 5.8 % — ABNORMAL HIGH (ref <5.7)
Mean Plasma Glucose: 120 mg/dL — ABNORMAL HIGH (ref <117)

## 2014-10-21 LAB — VITAMIN D 25 HYDROXY (VIT D DEFICIENCY, FRACTURES): VIT D 25 HYDROXY: 59 ng/mL (ref 30–100)

## 2014-10-26 ENCOUNTER — Encounter: Payer: Self-pay | Admitting: Physician Assistant

## 2014-10-26 ENCOUNTER — Telehealth: Payer: Medicare Other | Admitting: Physician Assistant

## 2014-10-26 DIAGNOSIS — J209 Acute bronchitis, unspecified: Secondary | ICD-10-CM

## 2014-10-26 MED ORDER — DOXYCYCLINE HYCLATE 100 MG PO TABS: 100.0000 mg | ORAL_TABLET | Freq: Two times a day (BID) | ORAL | Status: DC

## 2014-10-26 NOTE — Progress Notes (Signed)
Patient was seen 01/06 for bronchitis, given zpak and states that he is not feeling any better. Continue coughing, wheezing, denies CP. Will send in Doxycycline and patient will make an appointment in the office or go to ER if not better.

## 2014-11-08 ENCOUNTER — Telehealth: Payer: Self-pay | Admitting: Physician Assistant

## 2014-11-08 NOTE — Telephone Encounter (Signed)
Patient was seen 01/06 for bronchitis, given zpak, then had an Evisit/telephone visit on 1/12 states he was not feeling better, Doxycycline called in. Patient called the office late 11/04/2014 and due to inclement weather did not reach patient until 11/08/2014, he states that he is now feeling better. He will make an appointment in the office if anything changes.

## 2014-11-09 ENCOUNTER — Ambulatory Visit (INDEPENDENT_AMBULATORY_CARE_PROVIDER_SITE_OTHER): Payer: Medicare Other | Admitting: Cardiovascular Disease

## 2014-11-09 ENCOUNTER — Encounter: Payer: Self-pay | Admitting: Cardiovascular Disease

## 2014-11-09 VITALS — BP 120/62 | HR 50 | Ht 70.0 in | Wt 193.5 lb

## 2014-11-09 DIAGNOSIS — E785 Hyperlipidemia, unspecified: Secondary | ICD-10-CM

## 2014-11-09 DIAGNOSIS — I4891 Unspecified atrial fibrillation: Secondary | ICD-10-CM | POA: Diagnosis not present

## 2014-11-09 DIAGNOSIS — I10 Essential (primary) hypertension: Secondary | ICD-10-CM

## 2014-11-09 DIAGNOSIS — I480 Paroxysmal atrial fibrillation: Secondary | ICD-10-CM | POA: Diagnosis not present

## 2014-11-09 MED ORDER — DILTIAZEM HCL 60 MG PO TABS: 60.0000 mg | ORAL_TABLET | Freq: Four times a day (QID) | ORAL | Status: DC | PRN

## 2014-11-09 NOTE — Patient Instructions (Addendum)
You are doing well. No medication changes were made.  Please call us if you have new issues that need to be addressed before your next appt.  Your physician wants you to follow-up in: 12 months.  You will receive a reminder letter in the mail two months in advance. If you don't receive a letter, please call our office to schedule the follow-up appointment. 

## 2014-11-09 NOTE — Assessment & Plan Note (Signed)
Blood pressure is well controlled on today's visit. No changes made to the medications. 

## 2014-11-09 NOTE — Assessment & Plan Note (Signed)
Cholesterol is at goal on the current lipid regimen. No changes to the medications were made.  

## 2014-11-09 NOTE — Assessment & Plan Note (Signed)
Rare atrial fibrillation. No medication changes made. He is off warfarin secondary to abdominal hematoma. We'll monitor closely and suggested he call the office if he has frequent episodes of atrial fibrillation

## 2014-11-09 NOTE — Progress Notes (Signed)
Patient ID: [name] Yu, male    DOB: [birth year], 81 y.o.   MRN: [id number]  HPI Comments: [name] is a 81 yo gentleman with no known coronary artery disease, previously followed at [place], history of HTN, elevated lipids and sleep apnea, bradycardia on calcium channel blockers and beta blockers, presents for routine followup of his hypertension and atrial fibrillation. Previously followed at the [facility].  prior pipe smoking for 20 years.  In follow-up today, he reports that he is doing well He is recovering from bronchitis which she developed over Christmas 2015. He required antibiotics, now with minimal residual cough. He had one episode of atrial fibrillation lasting for 20 minutes, took extra diltiazem 60 mg, lay in the bed and symptoms resolved. At the time he felt very tense which she attributed to the atrial fibrillation. Heart rate typically runs low though he denies having any significant symptoms. Otherwise he is active, though no regular exercise program  EKG on today's visit shows normal sinus rhythm with rate 50 bpm, no significant ST or T-wave changes, left anterior fascicular block  Other past medical history  normal cath in 2003.  Normal stress test in 2005 at [facility].  07/2006 had Afib w/u at [facility].  [place] and on cardizem Dose decreased due to bradycardia Another normal stress test in 2007.     Allergies  Allergen Reactions  . Ciprofloxacin     dysphoria  . Novocain [Procaine Hcl]   . Prednisone     Nervousness    Outpatient Encounter Prescriptions as of 11/09/2014  Medication Sig  . Calcium Carbonate Antacid (TUMS E-X PO) Take 2 tablets by mouth daily.  . Cholecalciferol 5000 UNITS TABS Take 1 tablet by mouth daily.  . citalopram (CELEXA) 20 MG tablet Take 20 mg by mouth daily.  Marland Kitchen diltiazem (CARDIZEM) 60 MG tablet Take 1 tablet (60 mg total) by mouth 4 (four) times daily as needed.  . finasteride (PROSCAR) 5 MG tablet TAKE ONE TABLET BY MOUTH ONE TIME  DAILY.  Marland Kitchen LORazepam (ATIVAN) 2 MG tablet Take 1/2 to 1 tab 3 x day as directed  . Magnesium 200 MG TABS Take 1 tablet by mouth daily.  . Multiple Vitamin (MULTIVITAMIN) capsule Take 1 capsule by mouth daily.  . Omega-3 Fatty Acids (FISH OIL PO) Take 1 tablet by mouth daily.  . simvastatin (ZOCOR) 40 MG tablet TAKE ONE TABLET BY MOUTH ONE TIME DAILY FOR CHLOSTEROL  . vitamin C (ASCORBIC ACID) 500 MG tablet Take 1,000 mg by mouth daily.  . [DISCONTINUED] diltiazem (CARDIZEM) 60 MG tablet Take 1 tablet (60 mg total) by mouth 4 (four) times daily as needed. (Patient taking differently: Take 60 mg by mouth 2 (two) times daily. )  . aspirin EC 81 MG tablet Take 1 tablet (81 mg total) by mouth daily.  . [DISCONTINUED] amoxicillin (AMOXIL) 250 MG capsule Take 1 capsule (250 mg total) by mouth 3 (three) times daily. After meals for prostate infection (Patient not taking: Reported on 11/09/2014)  . [DISCONTINUED] azithromycin (ZITHROMAX) 250 MG tablet 2 tablets by mouth today then one tablet daily for 4 days. (Patient not taking: Reported on 11/09/2014)  . [DISCONTINUED] citalopram (CELEXA) 40 MG tablet TAKE ONE TABLET BY MOUTH ONE TIME DAILY. (Patient not taking: Reported on 11/09/2014)  . [DISCONTINUED] doxycycline (VIBRA-TABS) 100 MG tablet Take 1 tablet (100 mg total) by mouth 2 (two) times daily. (Patient not taking: Reported on 11/09/2014)  . [DISCONTINUED] promethazine-codeine (PHENERGAN WITH CODEINE) 6.25-10  MG/5ML syrup Take 5 mLs by mouth every 6 (six) hours as needed for cough. (Patient not taking: Reported on 11/09/2014)    Past Medical History  Diagnosis Date  . Hypertension   . Pre-diabetes   . Dyslipidemia   . OSA (obstructive sleep apnea)   . Atrial fib/flutter, transient     off coumadin since 11/2013- CHADSVAC of 2  . Colon polyps     2003 hyperplastic and tubuler adenoma  . Diverticulosis   . Rectus sheath hematoma 11/21/2013    Dr. Deatra Ina   . ASHD (arteriosclerotic heart disease)   .  Depression     Past Surgical History  Procedure Laterality Date  . Hernia repair      Social History  reports that he quit smoking about 21 years ago. He has never used smokeless tobacco. He reports that he does not drink alcohol or use illicit drugs.  Family History family history includes Alzheimer's disease in his mother; Breast cancer in his daughter and sister; Diabetes in his brother; Heart disease in his brother and sister; Kidney cancer in his father; Rectal cancer in his sister; Ulcerative colitis in his sister.  Review of Systems  Constitutional: Negative.   Respiratory: Positive for cough.   Cardiovascular: Negative.   Gastrointestinal: Negative.   Musculoskeletal: Negative.   Skin: Negative.   Neurological: Negative.   Hematological: Negative.   Psychiatric/Behavioral: Negative.   All other systems reviewed and are negative.   BP 120/62 mmHg  Pulse 50  Ht 5\' 10"  (1.778 m)  Wt 193 lb 8 oz (87.771 kg)  BMI 27.76 kg/m2  Physical Exam  Constitutional: He is oriented to person, place, and time. He appears well-developed and well-nourished.  HENT:  Head: Normocephalic.  Nose: Nose normal.  Mouth/Throat: Oropharynx is clear and moist.  Eyes: Conjunctivae are normal. Pupils are equal, round, and reactive to light.  Neck: Normal range of motion. Neck supple. No JVD present.  Cardiovascular: Normal rate, regular rhythm, S1 normal, S2 normal, normal heart sounds and intact distal pulses.  Exam reveals no gallop and no friction rub.   No murmur heard. Pulmonary/Chest: Effort normal and breath sounds normal. No respiratory distress. He has no wheezes. He has no rales. He exhibits no tenderness.  Abdominal: Soft. Bowel sounds are normal. He exhibits no distension. There is no tenderness.  Musculoskeletal: Normal range of motion. He exhibits no edema or tenderness.  Lymphadenopathy:    He has no cervical adenopathy.  Neurological: He is alert and oriented to person,  place, and time. Coordination normal.  Skin: Skin is warm and dry. No rash noted. No erythema.  Psychiatric: He has a normal mood and affect. His behavior is normal. Judgment and thought content normal.      Assessment and Plan   Nursing note and vitals reviewed.

## 2015-01-25 ENCOUNTER — Encounter: Payer: Self-pay | Admitting: Internal Medicine

## 2015-01-25 ENCOUNTER — Ambulatory Visit (INDEPENDENT_AMBULATORY_CARE_PROVIDER_SITE_OTHER): Payer: Medicare Other | Admitting: Internal Medicine

## 2015-01-25 VITALS — BP 116/68 | HR 60 | Temp 97.9°F | Resp 16 | Ht 70.0 in | Wt 194.2 lb

## 2015-01-25 DIAGNOSIS — I480 Paroxysmal atrial fibrillation: Secondary | ICD-10-CM

## 2015-01-25 DIAGNOSIS — F32A Depression, unspecified: Secondary | ICD-10-CM

## 2015-01-25 DIAGNOSIS — F329 Major depressive disorder, single episode, unspecified: Secondary | ICD-10-CM | POA: Diagnosis not present

## 2015-01-25 DIAGNOSIS — Z9181 History of falling: Secondary | ICD-10-CM

## 2015-01-25 DIAGNOSIS — Z125 Encounter for screening for malignant neoplasm of prostate: Secondary | ICD-10-CM | POA: Diagnosis not present

## 2015-01-25 DIAGNOSIS — I10 Essential (primary) hypertension: Secondary | ICD-10-CM | POA: Diagnosis not present

## 2015-01-25 DIAGNOSIS — E782 Mixed hyperlipidemia: Secondary | ICD-10-CM | POA: Diagnosis not present

## 2015-01-25 DIAGNOSIS — Z0000 Encounter for general adult medical examination without abnormal findings: Secondary | ICD-10-CM

## 2015-01-25 DIAGNOSIS — Z79899 Other long term (current) drug therapy: Secondary | ICD-10-CM

## 2015-01-25 DIAGNOSIS — R6889 Other general symptoms and signs: Secondary | ICD-10-CM | POA: Diagnosis not present

## 2015-01-25 DIAGNOSIS — R7309 Other abnormal glucose: Secondary | ICD-10-CM | POA: Diagnosis not present

## 2015-01-25 DIAGNOSIS — Z1331 Encounter for screening for depression: Secondary | ICD-10-CM

## 2015-01-25 DIAGNOSIS — E559 Vitamin D deficiency, unspecified: Secondary | ICD-10-CM

## 2015-01-25 DIAGNOSIS — Z1212 Encounter for screening for malignant neoplasm of rectum: Secondary | ICD-10-CM

## 2015-01-25 DIAGNOSIS — Z0001 Encounter for general adult medical examination with abnormal findings: Secondary | ICD-10-CM

## 2015-01-25 DIAGNOSIS — R7303 Prediabetes: Secondary | ICD-10-CM

## 2015-01-25 LAB — CBC WITH DIFFERENTIAL/PLATELET
Basophils Absolute: 0 10*3/uL (ref 0.0–0.1)
Basophils Relative: 0 % (ref 0–1)
Eosinophils Absolute: 0.2 10*3/uL (ref 0.0–0.7)
Eosinophils Relative: 3 % (ref 0–5)
HCT: 44.5 % (ref 39.0–52.0)
Hemoglobin: 15.7 g/dL (ref 13.0–17.0)
LYMPHS PCT: 27 % (ref 12–46)
Lymphs Abs: 2 10*3/uL (ref 0.7–4.0)
MCH: 31.7 pg (ref 26.0–34.0)
MCHC: 35.3 g/dL (ref 30.0–36.0)
MCV: 89.9 fL (ref 78.0–100.0)
MPV: 8.8 fL (ref 8.6–12.4)
Monocytes Absolute: 0.7 10*3/uL (ref 0.1–1.0)
Monocytes Relative: 9 % (ref 3–12)
Neutro Abs: 4.5 10*3/uL (ref 1.7–7.7)
Neutrophils Relative %: 61 % (ref 43–77)
Platelets: 187 10*3/uL (ref 150–400)
RBC: 4.95 MIL/uL (ref 4.22–5.81)
RDW: 13.4 % (ref 11.5–15.5)
WBC: 7.3 10*3/uL (ref 4.0–10.5)

## 2015-01-25 LAB — BASIC METABOLIC PANEL WITH GFR
BUN: 14 mg/dL (ref 6–23)
CALCIUM: 9 mg/dL (ref 8.4–10.5)
CHLORIDE: 105 meq/L (ref 96–112)
CO2: 26 meq/L (ref 19–32)
CREATININE: 0.87 mg/dL (ref 0.50–1.35)
GFR, Est African American: >89 mL/min
GFR, Est Non African American: 78 mL/min
GLUCOSE: 94 mg/dL (ref 70–99)
Potassium: 4.3 mEq/L (ref 3.5–5.3)
Sodium: 139 mEq/L (ref 135–145)

## 2015-01-25 LAB — HEPATIC FUNCTION PANEL
ALK PHOS: 48 U/L (ref 39–117)
ALT: 15 U/L (ref 0–53)
AST: 20 U/L (ref 0–37)
Albumin: 3.9 g/dL (ref 3.5–5.2)
BILIRUBIN INDIRECT: 0.5 mg/dL (ref 0.2–1.2)
Bilirubin, Direct: 0.1 mg/dL (ref 0.0–0.3)
TOTAL PROTEIN: 6 g/dL (ref 6.0–8.3)
Total Bilirubin: 0.6 mg/dL (ref 0.2–1.2)

## 2015-01-25 LAB — TSH: TSH: 1.766 u[IU]/mL (ref 0.350–4.500)

## 2015-01-25 LAB — LIPID PANEL
CHOL/HDL RATIO: 2.7 ratio
CHOLESTEROL: 134 mg/dL (ref 0–200)
HDL: 50 mg/dL (ref >=40)
LDL CALC: 62 mg/dL (ref 0–99)
Triglycerides: 110 mg/dL (ref <150)
VLDL: 22 mg/dL (ref 0–40)

## 2015-01-25 LAB — MAGNESIUM: Magnesium: 2.1 mg/dL (ref 1.5–2.5)

## 2015-01-25 NOTE — Progress Notes (Signed)
Patient ID: [name] Yu, male   DOB: 06-03-1927, 81 y.o.   MRN: [id number]  [facility] VISIT AND CPE  Assessment:   1. Essential hypertension  - TSH  2. Mixed hyperlipidemia  - Lipid panel  3. Pre-diabetes  - Hemoglobin A1c - Insulin, random  4. Vitamin D deficiency  - Vit D  25 hydroxy   5. Paroxysmal atrial fibrillation   6. Medication management  - CBC with Differential/Platelet - BASIC METABOLIC PANEL WITH GFR - Hepatic function panel - Magnesium  7. Routine general medical examination at a health care facility   Plan:   During the course of the visit the patient was educated and counseled about appropriate screening and preventive services including:    Pneumococcal vaccine   Influenza vaccine  Td vaccine  Screening electrocardiogram  Bone densitometry screening  Colorectal cancer screening  Diabetes screening  Glaucoma screening  Nutrition counseling   Advanced directives: requested  Screening recommendations, referrals: Vaccinations: Immunization History  Administered Date(s) Administered  . DT 07/06/2014  . Influenza Split 07/20/2014  . Pneumococcal Conjugate-13 07/06/2014  . Pneumococcal Polysaccharide-23 06/09/2013  . Zoster 10/15/2005  Hep B vaccine not indicated  Nutrition assessed and recommended  Colonoscopy 2003 Recommended yearly ophthalmology/optometry visit for glaucoma screening and checkup Recommended yearly dental visit for hygiene and checkup Advanced directives - yes  Conditions/risks identified: BMI: Discussed weight loss, diet, and increase physical activity.  Increase physical activity: AHA recommends 150 minutes of physical activity a week.  Medications reviewed PreDiabetes is not at goal, ACE/ARB therapy: not indicated Urinary Incontinence is not an issue: discussed non pharmacology and pharmacology options.  Fall risk: low- discussed PT, home fall assessment, medications.   Subjective:     [name] Yu presents for [employer] Visit and OV.  Date of last medicare wellness visit was 07/06/2014.  This very nice 81 y.o. MWM presents for 3 month follow up with Hypertension, Hyperlipidemia, Pre-Diabetes and Vitamin D Deficiency.    Patient is treated for HTN since 2005 & BP has been controlled at home. Today's BP: 116/68 mmHg. Patient has hx/o chAfib predating since 2007 and had been on coumadin until last fall when he had a spontaneous large rectus sheath hematoma and his Cardiologist at [facility] d/c'd his coumadin at that time opting for treatment with low dose ASA alone. Patient has had no complaints of any cardiac type chest pain, palpitations, dyspnea/orthopnea/PND, dizziness, claudication, or dependent edema.   Hyperlipidemia is controlled with diet & meds. Patient denies myalgias or other med SE's. Last Lipids were at goal - Total Chol 121; HDL 43; LDL  57; [id number] on 10/20/2014.   Also, the patient has history of PreDiabetes since Feb 2014 with A1c 5.7%. and has had no symptoms of reactive hypoglycemia, diabetic polys, paresthesias or visual blurring.  Last A1c was 5.8% on 10/20/2014.   Further, the patient also has history of Vitamin D Deficiency and supplements vitamin D without any suspected side-effects. Last vitamin D was  59 on 10/20/2014.  Names of Other Physician/Practitioners you currently use: 1. [facility] here for primary care 2. Eye Dr is at [facility], eye doctor, last visit Apr 2016 3. Dr Lindaann Slough , DDS, dentist, last visit 2015  Patient Care Team: [name], MD as PCP - General (Internal Medicine) Benito Mccreedy, MD as Referring Physician (Family Medicine) Minna Merritts, MD as Consulting Physician (Cardiology) Inda Castle, MD as Consulting Physician (Gastroenterology)  Medication Review:  Medication Sig  . aspirin EC 81 MG tablet Take 1 tablet (81 mg total) by mouth daily.  . Calcium Carbonate  Antacid (TUMS E-X PO) Take 2 tablets by mouth daily.  . Cholecalciferol 5000 UNITS TABS Take 1 tablet by mouth daily.  . citalopram (CELEXA) 20 MG tablet Take 20 mg by mouth daily.  Marland Kitchen diltiazem (CARDIZEM) 60 MG tablet Take 1 tablet (60 mg total) by mouth 4 (four) times daily as needed.  . finasteride (PROSCAR) 5 MG tablet TAKE ONE TABLET BY MOUTH ONE TIME DAILY.  Marland Kitchen LORazepam (ATIVAN) 2 MG tablet Take 1/2 to 1 tab 3 x day as directed  . Magnesium 200 MG TABS Take 1 tablet by mouth daily.  . Multiple Vitamin (MULTIVITAMIN) capsule Take 1 capsule by mouth daily.  . Omega-3 Fatty Acids (FISH OIL PO) Take 1 tablet by mouth daily.  . simvastatin (ZOCOR) 40 MG tablet TAKE ONE TABLET BY MOUTH ONE TIME DAILY FOR CHLOSTEROL  . vitamin C (ASCORBIC ACID) 500 MG tablet Take 1,000 mg by mouth daily.   Current Problems (verified) Patient Active Problem List   Diagnosis Date Noted  . Vitamin D deficiency 03/04/2014  . Medication management 02/17/2014  . ASHD (arteriosclerotic heart disease)   . Depression   . Diverticulosis   . Rectus sheath hematoma 02/11/2014  . Personal history of colonic polyps 12/29/2012  . Hypertension   . Pre-diabetes   . Mixed hyperlipidemia   . OSA (obstructive sleep apnea)   . Atrial fibrillation    Screening Tests Health Maintenance  Topic Date Due  . TETANUS/TDAP  01/01/1946  . COLONOSCOPY  03/11/2012  . INFLUENZA VACCINE  05/16/2015  . ZOSTAVAX  Completed  . PNA vac Low Risk Adult  Completed   Immunization History  Administered Date(s) Administered  . DT 07/06/2014  . Influenza Split 07/20/2014  . Pneumococcal Conjugate-13 07/06/2014  . Pneumococcal Polysaccharide-23 06/09/2013  . Zoster 10/15/2005   Preventative care: Last colonoscopy: 2003  History reviewed: allergies, current medications, past family history, past medical history, past social history, past surgical history and problem list  Risk Factors: Tobacco History  Substance Use Topics  .  Smoking status: Former Smoker    Quit date: 09/14/1993  . Smokeless tobacco: Never Used  . Alcohol Use: No   He does not smoke.  Patient is a former smoker. Are there smokers in your home (other than you)?  No  Alcohol Current alcohol use: none  Caffeine Current caffeine use: denies use  Exercise Current exercise: walking  Nutrition/Diet Current diet: in general, a "healthy" diet    Cardiac risk factors: advanced age (older than 60 for men, 32 for women), dyslipidemia, hypertension, male gender and sedentary lifestyle.  Depression Screen (Note: if answer to either of the following is "Yes", a more complete depression screening is indicated)   Q1: Over the past two weeks, have you felt down, depressed or hopeless? No  Q2: Over the past two weeks, have you felt little interest or pleasure in doing things? No  Have you lost interest or pleasure in daily life? No  Do you often feel hopeless? No  Do you cry easily over simple problems? No  Activities of Daily Living In your present state of health, do you have any difficulty performing the following activities?:  Driving? No Managing money?  No Feeding yourself? No Getting from bed to chair? No Climbing a flight of stairs? No Preparing food and eating?: No Bathing or showering? No Getting dressed: No  Getting to the toilet? No Using the toilet:No Moving around from place to place: No In the past year have you fallen or had a near fall?:No   Are you sexually active?  No  Do you have more than one partner?  No  Vision Difficulties: No  Hearing Difficulties: No Do you often ask people to speak up or repeat themselves? No Do you experience ringing or noises in your ears? No Do you have difficulty understanding soft or whispered voices? No  Cognition  Do you feel that you have a problem with memory?No  Do you often misplace items? No  Do you feel safe at home?  Yes  Advanced directives Does patient have a [place]? Yes Does patient have a Living Will? Yes  Past Medical History  Diagnosis Date  . Hypertension   . Pre-diabetes   . Dyslipidemia   . OSA (obstructive sleep apnea)   . Atrial fib/flutter, transient     off coumadin since 11/2013- CHADSVAC of 2  . Colon polyps     2003 hyperplastic and tubuler adenoma  . Diverticulosis   . Rectus sheath hematoma 11/21/2013    Dr. Deatra Ina   . ASHD (arteriosclerotic heart disease)   . Depression    Past Surgical History  Procedure Laterality Date  . Hernia repair     ROS: Constitutional: Denies fever, chills, weight loss/gain, headaches, insomnia, fatigue, night sweats or change in appetite. Eyes: Denies redness, blurred vision, diplopia, discharge, itchy or watery eyes.  ENT: Denies discharge, congestion, post nasal drip, epistaxis, sore throat, earache, hearing loss, dental pain, Tinnitus, Vertigo, Sinus pain or snoring.  Cardio: Denies chest pain, palpitations, irregular heartbeat, syncope, dyspnea, diaphoresis, orthopnea, PND, claudication or edema Respiratory: denies cough, dyspnea, DOE, pleurisy, hoarseness, laryngitis or wheezing.  Gastrointestinal: Denies dysphagia, heartburn, reflux, water brash, pain, cramps, nausea, vomiting, bloating, diarrhea, constipation, hematemesis, melena, hematochezia, jaundice or hemorrhoids Genitourinary: Denies dysuria, frequency, urgency, nocturia, hesitancy, discharge, hematuria or flank pain Musculoskeletal: Denies arthralgia, myalgia, stiffness, Jt. Swelling, pain, limp or strain/sprain. Denies Falls. Skin: Denies puritis, rash, hives, warts, acne, eczema or change in skin lesion Neuro: No weakness, tremor, incoordination, spasms, paresthesia or pain Psychiatric: Denies confusion, memory loss or sensory loss. Denies Depression. Endocrine: Denies change in weight, skin, hair change, nocturia, and paresthesia, diabetic polys, visual blurring or hyper / hypo glycemic episodes.  Heme/Lymph:  No excessive bleeding, bruising or enlarged lymph nodes.  Objective:     BP 116/68   Pulse 60  Temp 97.9 F   Resp 16  Ht 5\' 10"    Wt 194 lb 3.2 oz     BMI 27.86   General Appearance:  Alert  WD/WN, male  in no apparent distress. Eyes: PERRLA, EOMs nl, conjunctiva normal, normal fundi and vessels. Sinuses: No frontal/maxillary tenderness ENT/Mouth: EACs patent / TMs  nl. Nares clear without erythema, swelling, mucoid exudates. Oral hygiene is good. No erythema, swelling, or exudate. Tongue normal, non-obstructing. Tonsils not swollen or erythematous. Hearing normal.  Neck: Supple, thyroid normal. No bruits, nodes or JVD. Respiratory: Respiratory effort normal.  BS equal and clear bilateral without rales, rhonci, wheezing or stridor. Cardio: Heart sounds are normal with slightly irregular rate and rhythm and no murmurs, rubs or gallops. Peripheral pulses are normal and equal bilaterally without edema. No aortic or femoral bruits. Chest: symmetric with normal excursions and percussion.  Abdomen: Flat, soft, with nl bowel sounds. Nontender, no guarding, rebound, hernias, masses, or organomegaly.  Lymphatics: Non  tender without lymphadenopathy.  Musculoskeletal: Full ROM all peripheral extremities, joint stability, 5/5 strength, and normal gait. Skin: Warm and dry without rashes, lesions, cyanosis, clubbing or  ecchymosis.  Neuro: Cranial nerves intact, reflexes equal bilaterally. Normal muscle tone, no cerebellar symptoms. Sensation intact.  Pysch: Alert and oriented X 3 with normal affect, insight and judgment appropriate.   Cognitive Testing  Alert? Yes  Normal Appearance? Yes  Oriented to person? Yes  Place? Yes   Time? Yes  Recall of three objects?  Yes  Can perform simple calculations? Yes  Displays appropriate judgment? Yes  Can read the correct time from a watch/clock? Yes  Medicare Attestation I have personally reviewed: The patient's medical and social history Their use  of alcohol, tobacco or illicit drugs Their current medications and supplements The patient's functional ability including ADLs,fall risks, home safety risks, cognitive, and hearing and visual impairment Diet and physical activities Evidence for depression or mood disorders  The patient's weight, height, BMI, and visual acuity have been recorded in the chart.  I have made referrals, counseling, and provided education to the patient based on review of the above and I have provided the patient with a written personalized care plan for preventive services.  Over 40 minutes of exam, counseling, chart review was performed.   [name] Yu [name], MD   01/25/2015

## 2015-01-25 NOTE — Patient Instructions (Signed)
Recommend the book "The END of DIETING" by Dr Excell Seltzer   & the book "The END of DIABETES " by Dr Excell Seltzer  At [email or web address] - get book & Audio CD's      Being diabetic has a  300% increased risk for heart attack, stroke, cancer, and alzheimer- type vascular dementia. It is very important that you work harder with diet by avoiding all foods that are white. Avoid white rice (brown & wild rice is OK), white potatoes (sweetpotatoes in moderation is OK), White bread or wheat bread or anything made out of white flour like bagels, donuts, rolls, buns, biscuits, cakes, pastries, cookies, pizza crust, and pasta (made from white flour & egg whites) - vegetarian pasta or spinach or wheat pasta is OK. Multigrain breads like Arnold's or [employer], or multigrain sandwich thins or flatbreads.  Diet, exercise and weight loss can reverse and cure diabetes in the early stages.  Diet, exercise and weight loss is very important in the control and prevention of complications of diabetes which affects every system in your body, ie. Brain - dementia/stroke, eyes - glaucoma/blindness, heart - heart attack/heart failure, kidneys - dialysis, stomach - gastric paralysis, intestines - malabsorption, nerves - severe painful neuritis, circulation - gangrene & loss of a leg(s), and finally cancer and Alzheimers.    I recommend avoid fried & greasy foods,  sweets/candy, white rice (brown or wild rice or Quinoa is OK), white potatoes (sweet potatoes are OK) - anything made from white flour - bagels, doughnuts, rolls, buns, biscuits,white and wheat breads, pizza crust and traditional pasta made of white flour & egg white(vegetarian pasta or spinach or wheat pasta is OK).  Multi-grain bread is OK - like multi-grain flat bread or sandwich thins. Avoid alcohol in excess. Exercise is also important.    Eat all the vegetables you want - avoid meat, especially red meat and dairy - especially cheese.  Cheese is the most  concentrated form of trans-fats which is the worst thing to clog up our arteries. Veggie cheese is OK which can be found in the fresh produce section at [employer] or [employer] or [employer]  Preventive Care for Adults A healthy lifestyle and preventive care can promote health and wellness. Preventive health guidelines for men include the following key practices:  A routine yearly physical is a good way to check with your health care provider about your health and preventative screening. It is a chance to share any concerns and updates on your health and to receive a thorough exam.  Visit your dentist for a routine exam and preventative care every 6 months. Brush your teeth twice a day and floss once a day. Good oral hygiene prevents tooth decay and gum disease.  The frequency of eye exams is based on your age, health, family medical history, use of contact lenses, and other factors. Follow your health care provider's recommendations for frequency of eye exams.  Eat a healthy diet. Foods such as vegetables, fruits, whole grains, low-fat dairy products, and lean protein foods contain the nutrients you need without too many calories. Decrease your intake of foods high in solid fats, added sugars, and salt. Eat the right amount of calories for you.Get information about a proper diet from your health care provider, if necessary.  Regular physical exercise is one of the most important things you can do for your health. Most adults should get at least 150 minutes of moderate-intensity exercise (any activity that increases your heart rate  and causes you to sweat) each week. In addition, most adults need muscle-strengthening exercises on 2 or more days a week.  Maintain a healthy weight. The body mass index (BMI) is a screening tool to identify possible weight problems. It provides an estimate of body fat based on height and weight. Your health care provider can find your BMI and can help you achieve or  maintain a healthy weight.For adults 20 years and older:  A BMI below 18.5 is considered underweight.  A BMI of 18.5 to 24.9 is normal.  A BMI of 25 to 29.9 is considered overweight.  A BMI of 30 and above is considered obese.  Maintain normal blood lipids and cholesterol levels by exercising and minimizing your intake of saturated fat. Eat a balanced diet with plenty of fruit and vegetables. Blood tests for lipids and cholesterol should begin at age 74 and be repeated every 5 years. If your lipid or cholesterol levels are high, you are over 50, or you are at high risk for heart disease, you may need your cholesterol levels checked more frequently.Ongoing high lipid and cholesterol levels should be treated with medicines if diet and exercise are not working.  If you smoke, find out from your health care provider how to quit. If you do not use tobacco, do not start.  Lung cancer screening is recommended for adults aged 66-80 years who are at high risk for developing lung cancer because of a history of smoking. A yearly low-dose CT scan of the lungs is recommended for people who have at least a 30-pack-year history of smoking and are a current smoker or have quit within the past 15 years. A pack year of smoking is smoking an average of 1 pack of cigarettes a day for 1 year (for example: 1 pack a day for 30 years or 2 packs a day for 15 years). Yearly screening should continue until the smoker has stopped smoking for at least 15 years. Yearly screening should be stopped for people who develop a health problem that would prevent them from having lung cancer treatment.  If you choose to drink alcohol, do not have more than 2 drinks per day. One drink is considered to be 12 ounces (355 mL) of beer, 5 ounces (148 mL) of wine, or 1.5 ounces (44 mL) of liquor.  Avoid use of street drugs. Do not share needles with anyone. Ask for help if you need support or instructions about stopping the use of  drugs.  High blood pressure causes heart disease and increases the risk of stroke. Your blood pressure should be checked at least every 1-2 years. Ongoing high blood pressure should be treated with medicines, if weight loss and exercise are not effective.  If you are 62-43 years old, ask your health care provider if you should take aspirin to prevent heart disease.  Diabetes screening involves taking a blood sample to check your fasting blood sugar level. This should be done once every 3 years, after age 52, if you are within normal weight and without risk factors for diabetes. Testing should be considered at a younger age or be carried out more frequently if you are overweight and have at least 1 risk factor for diabetes.  Colorectal cancer can be detected and often prevented. Most routine colorectal cancer screening begins at the age of 52 and continues through age 104. However, your health care provider may recommend screening at an earlier age if you have risk factors for colon cancer.  On a yearly basis, your health care provider may provide home test kits to check for hidden blood in the stool. Use of a small camera at the end of a tube to directly examine the colon (sigmoidoscopy or colonoscopy) can detect the earliest forms of colorectal cancer. Talk to your health care provider about this at age 9, when routine screening begins. Direct exam of the colon should be repeated every 5-10 years through age 9, unless early forms of precancerous polyps or small growths are found.  People who are at an increased risk for hepatitis B should be screened for this virus. You are considered at high risk for hepatitis B if:  You were born in a country where hepatitis B occurs often. Talk with your health care provider about which countries are considered high risk.  Your parents were born in a high-risk country and you have not received a shot to protect against hepatitis B (hepatitis B vaccine).  You have  HIV or AIDS.  You use needles to inject street drugs.  You live with, or have sex with, someone who has hepatitis B.  You are a man who has sex with other men (MSM).  You get hemodialysis treatment.  You take certain medicines for conditions such as cancer, organ transplantation, and autoimmune conditions.  Hepatitis C blood testing is recommended for all people born from [birth year] through 1965 and any individual with known risks for hepatitis C.  Practice safe sex. Use condoms and avoid high-risk sexual practices to reduce the spread of sexually transmitted infections (STIs). STIs include gonorrhea, chlamydia, syphilis, trichomonas, herpes, HPV, and human immunodeficiency virus (HIV). Herpes, HIV, and HPV are viral illnesses that have no cure. They can result in disability, cancer, and death.  If you are at risk of being infected with HIV, it is recommended that you take a prescription medicine daily to prevent HIV infection. This is called preexposure prophylaxis (PrEP). You are considered at risk if:  You are a man who has sex with other men (MSM) and have other risk factors.  You are a heterosexual man, are sexually active, and are at increased risk for HIV infection.  You take drugs by injection.  You are sexually active with a partner who has HIV.  Talk with your health care provider about whether you are at high risk of being infected with HIV. If you choose to begin PrEP, you should first be tested for HIV. You should then be tested every 3 months for as long as you are taking PrEP.  A one-time screening for abdominal aortic aneurysm (AAA) and surgical repair of large AAAs by ultrasound are recommended for men ages 39 to 63 years who are current or former smokers.  Healthy men should no longer receive prostate-specific antigen (PSA) blood tests as part of routine cancer screening. Talk with your health care provider about prostate cancer screening.  Testicular cancer screening is  not recommended for adult males who have no symptoms. Screening includes self-exam, a health care provider exam, and other screening tests. Consult with your health care provider about any symptoms you have or any concerns you have about testicular cancer.  Use sunscreen. Apply sunscreen liberally and repeatedly throughout the day. You should seek shade when your shadow is shorter than you. Protect yourself by wearing long sleeves, pants, a wide-brimmed hat, and sunglasses year round, whenever you are outdoors.  Once a month, do a whole-body skin exam, using a mirror to look at the skin on  your back. Tell your health care provider about new moles, moles that have irregular borders, moles that are larger than a pencil eraser, or moles that have changed in shape or color.  Stay current with required vaccines (immunizations).  Influenza vaccine. All adults should be immunized every year.  Tetanus, diphtheria, and acellular pertussis (Td, Tdap) vaccine. An adult who has not previously received Tdap or who does not know his vaccine status should receive 1 dose of Tdap. This initial dose should be followed by tetanus and diphtheria toxoids (Td) booster doses every 10 years. Adults with an unknown or incomplete history of completing a 3-dose immunization series with Td-containing vaccines should begin or complete a primary immunization series including a Tdap dose. Adults should receive a Td booster every 10 years.  Varicella vaccine. An adult without evidence of immunity to varicella should receive 2 doses or a second dose if he has previously received 1 dose.  Human papillomavirus (HPV) vaccine. Males aged 8-21 years who have not received the vaccine previously should receive the 3-dose series. Males aged 22-26 years may be immunized. Immunization is recommended through the age of 49 years for any male who has sex with males and did not get any or all doses earlier. Immunization is recommended for any  person with an immunocompromised condition through the age of 14 years if he did not get any or all doses earlier. During the 3-dose series, the second dose should be obtained 4-8 weeks after the first dose. The third dose should be obtained 24 weeks after the first dose and 16 weeks after the second dose.  Zoster vaccine. One dose is recommended for adults aged 62 years or older unless certain conditions are present.  Measles, mumps, and rubella (MMR) vaccine. Adults born before 45 generally are considered immune to measles and mumps. Adults born in [birth year] or later should have 1 or more doses of MMR vaccine unless there is a contraindication to the vaccine or there is laboratory evidence of immunity to each of the three diseases. A routine second dose of MMR vaccine should be obtained at least 28 days after the first dose for students attending postsecondary schools, health care workers, or international travelers. People who received inactivated measles vaccine or an unknown type of measles vaccine during 1963-1967 should receive 2 doses of MMR vaccine. People who received inactivated mumps vaccine or an unknown type of mumps vaccine before 1979 and are at high risk for mumps infection should consider immunization with 2 doses of MMR vaccine. Unvaccinated health care workers born before [birth year] who lack laboratory evidence of measles, mumps, or rubella immunity or laboratory confirmation of disease should consider measles and mumps immunization with 2 doses of MMR vaccine or rubella immunization with 1 dose of MMR vaccine.  Pneumococcal 13-valent conjugate (PCV13) vaccine. When indicated, a person who is uncertain of his immunization history and has no record of immunization should receive the PCV13 vaccine. An adult aged 2 years or older who has certain medical conditions and has not been previously immunized should receive 1 dose of PCV13 vaccine. This PCV13 should be followed with a dose of pneumococcal  polysaccharide (PPSV23) vaccine. The PPSV23 vaccine dose should be obtained at least 8 weeks after the dose of PCV13 vaccine. An adult aged 57 years or older who has certain medical conditions and previously received 1 or more doses of PPSV23 vaccine should receive 1 dose of PCV13. The PCV13 vaccine dose should be obtained 1 or more years after  the last PPSV23 vaccine dose.  Pneumococcal polysaccharide (PPSV23) vaccine. When PCV13 is also indicated, PCV13 should be obtained first. All adults aged 76 years and older should be immunized. An adult younger than age 18 years who has certain medical conditions should be immunized. Any person who resides in a nursing home or long-term care facility should be immunized. An adult smoker should be immunized. People with an immunocompromised condition and certain other conditions should receive both PCV13 and PPSV23 vaccines. People with human immunodeficiency virus (HIV) infection should be immunized as soon as possible after diagnosis. Immunization during chemotherapy or radiation therapy should be avoided. Routine use of PPSV23 vaccine is not recommended for American Indians, [place] Natives, or people younger than 65 years unless there are medical conditions that require PPSV23 vaccine. When indicated, people who have unknown immunization and have no record of immunization should receive PPSV23 vaccine. One-time revaccination 5 years after the first dose of PPSV23 is recommended for people aged 19-64 years who have chronic kidney failure, nephrotic syndrome, asplenia, or immunocompromised conditions. People who received 1-2 doses of PPSV23 before age 87 years should receive another dose of PPSV23 vaccine at age 51 years or later if at least 5 years have passed since the previous dose. Doses of PPSV23 are not needed for people immunized with PPSV23 at or after age 84 years.  Meningococcal vaccine. Adults with asplenia or persistent complement component deficiencies  should receive 2 doses of quadrivalent meningococcal conjugate (MenACWY-D) vaccine. The doses should be obtained at least 2 months apart. Microbiologists working with certain meningococcal bacteria, [place] recruits, people at risk during an outbreak, and people who travel to or live in countries with a high rate of meningitis should be immunized. A first-year college student up through age 42 years who is living in a residence hall should receive a dose if he did not receive a dose on or after his 16th birthday. Adults who have certain high-risk conditions should receive one or more doses of vaccine.  Hepatitis A vaccine. Adults who wish to be protected from this disease, have certain high-risk conditions, work with hepatitis A-infected animals, work in hepatitis A research labs, or travel to or work in countries with a high rate of hepatitis A should be immunized. Adults who were previously unvaccinated and who anticipate close contact with an international adoptee during the first 60 days after arrival in the [place] from a country with a high rate of hepatitis A should be immunized.  Hepatitis B vaccine. Adults should be immunized if they wish to be protected from this disease, have certain high-risk conditions, may be exposed to blood or other infectious body fluids, are household contacts or sex partners of hepatitis B positive people, are clients or workers in certain care facilities, or travel to or work in countries with a high rate of hepatitis B.  Haemophilus influenzae type b (Hib) vaccine. A previously unvaccinated person with asplenia or sickle cell disease or having a scheduled splenectomy should receive 1 dose of Hib vaccine. Regardless of previous immunization, a recipient of a hematopoietic stem cell transplant should receive a 3-dose series 6-12 months after his successful transplant. Hib vaccine is not recommended for adults with HIV infection. Preventive Service / Frequency Ages  3 to 44  Blood pressure check.** / Every 1 to 2 years.  Lipid and cholesterol check.** / Every 5 years beginning at age 41.  Hepatitis C blood test.** / For any individual with known risks for hepatitis C.  Skin self-exam. / Monthly.  Influenza vaccine. / Every year.  Tetanus, diphtheria, and acellular pertussis (Tdap, Td) vaccine.** / Consult your health care provider. 1 dose of Td every 10 years.  Varicella vaccine.** / Consult your health care provider.  HPV vaccine. / 3 doses over 6 months, if 12 or younger.  Measles, mumps, rubella (MMR) vaccine.** / You need at least 1 dose of MMR if you were born in 1957 or later. You may also need a second dose.  Pneumococcal 13-valent conjugate (PCV13) vaccine.** / Consult your health care provider.  Pneumococcal polysaccharide (PPSV23) vaccine.** / 1 to 2 doses if you smoke cigarettes or if you have certain conditions.  Meningococcal vaccine.** / 1 dose if you are age 62 to 68 years and a Market researcher living in a residence hall, or have one of several medical conditions. You may also need additional booster doses.  Hepatitis A vaccine.** / Consult your health care provider.  Hepatitis B vaccine.** / Consult your health care provider.  Haemophilus influenzae type b (Hib) vaccine.** / Consult your health care provider. Ages 47 to 58  Blood pressure check.** / Every 1 to 2 years.  Lipid and cholesterol check.** / Every 5 years beginning at age 66.  Lung cancer screening. / Every year if you are aged 75-80 years and have a 30-pack-year history of smoking and currently smoke or have quit within the past 15 years. Yearly screening is stopped once you have quit smoking for at least 15 years or develop a health problem that would prevent you from having lung cancer treatment.  Fecal occult blood test (FOBT) of stool. / Every year beginning at age 24 and continuing until age 5. You may not have to do this test if you get a  colonoscopy every 10 years.  Flexible sigmoidoscopy** or colonoscopy.** / Every 5 years for a flexible sigmoidoscopy or every 10 years for a colonoscopy beginning at age 69 and continuing until age 16.  Hepatitis C blood test.** / For all people born from [birth year] through 1965 and any individual with known risks for hepatitis C.  Skin self-exam. / Monthly.  Influenza vaccine. / Every year.  Tetanus, diphtheria, and acellular pertussis (Tdap/Td) vaccine.** / Consult your health care provider. 1 dose of Td every 10 years.  Varicella vaccine.** / Consult your health care provider.  Zoster vaccine.** / 1 dose for adults aged 15 years or older.  Measles, mumps, rubella (MMR) vaccine.** / You need at least 1 dose of MMR if you were born in 1955 or later. You may also need a second dose.  Pneumococcal 13-valent conjugate (PCV13) vaccine.** / Consult your health care provider.  Pneumococcal polysaccharide (PPSV23) vaccine.** / 1 to 2 doses if you smoke cigarettes or if you have certain conditions.  Meningococcal vaccine.** / Consult your health care provider.  Hepatitis A vaccine.** / Consult your health care provider.  Hepatitis B vaccine.** / Consult your health care provider.  Haemophilus influenzae type b (Hib) vaccine.** / Consult your health care provider. Ages 52 and over  Blood pressure check.** / Every 1 to 2 years.  Lipid and cholesterol check.**/ Every 5 years beginning at age 5.  Lung cancer screening. / Every year if you are aged 44-80 years and have a 30-pack-year history of smoking and currently smoke or have quit within the past 15 years. Yearly screening is stopped once you have quit smoking for at least 15 years or develop a health problem that would prevent you  from having lung cancer treatment.  Fecal occult blood test (FOBT) of stool. / Every year beginning at age 35 and continuing until age 40. You may not have to do this test if you get a colonoscopy every 10  years.  Flexible sigmoidoscopy** or colonoscopy.** / Every 5 years for a flexible sigmoidoscopy or every 10 years for a colonoscopy beginning at age 23 and continuing until age 26.  Hepatitis C blood test.** / For all people born from [birth year] through 1963 and any individual with known risks for hepatitis C.  Abdominal aortic aneurysm (AAA) screening./ Screening current or former smokers or have Hypertension.  Skin self-exam. / Monthly.  Influenza vaccine. / Every year.  Tetanus, diphtheria, and acellular pertussis (Tdap/Td) vaccine.** / 1 dose of Td every 10 years.  Varicella vaccine.** / Consult your health care provider.  Zoster vaccine.** / 1 dose for adults aged 23 years or older.  Pneumococcal 13-valent conjugate (PCV13) vaccine.** / Consult your health care provider.  Pneumococcal polysaccharide (PPSV23) vaccine.** / 1 dose for all adults aged 5 years and older.  Meningococcal vaccine.** / Consult your health care provider.  Hepatitis A vaccine.** / Consult your health care provider.  Hepatitis B vaccine.** / Consult your health care provider.  Haemophilus influenzae type b (Hib) vaccine.** / Consult your health care provider.  Health Maintenance A healthy lifestyle and preventative care can promote health and wellness. Maintain regular health, dental, and eye exams. Eat a healthy diet. Foods like vegetables, fruits, whole grains, low-fat dairy products, and lean protein foods contain the nutrients you need and are low in calories. Decrease your intake of foods high in solid fats, added sugars, and salt. Get information about a proper diet from your health care provider, if necessary. Regular physical exercise is one of the most important things you can do for your health. Most adults should get at least 150 minutes of moderate-intensity exercise (any activity that increases your heart rate and causes you to sweat) each week. In addition, most adults need muscle-strengthening  exercises on 2 or more days a week.  Maintain a healthy weight. The body mass index (BMI) is a screening tool to identify possible weight problems. It provides an estimate of body fat based on height and weight. Your health care provider can find your BMI and can help you achieve or maintain a healthy weight. For males 20 years and older: A BMI below 18.5 is considered underweight. A BMI of 18.5 to 24.9 is normal. A BMI of 25 to 29.9 is considered overweight. A BMI of 30 and above is considered obese. Maintain normal blood lipids and cholesterol by exercising and minimizing your intake of saturated fat. Eat a balanced diet with plenty of fruits and vegetables. Blood tests for lipids and cholesterol should begin at age 24 and be repeated every 5 years. If your lipid or cholesterol levels are high, you are over age 70, or you are at high risk for heart disease, you may need your cholesterol levels checked more frequently.Ongoing high lipid and cholesterol levels should be treated with medicines if diet and exercise are not working. If you smoke, find out from your health care provider how to quit. If you do not use tobacco, do not start. Lung cancer screening is recommended for adults aged 70-80 years who are at high risk for developing lung cancer because of a history of smoking. A yearly low-dose CT scan of the lungs is recommended for people who have at least a 30-pack-year  history of smoking and are current smokers or have quit within the past 15 years. A pack year of smoking is smoking an average of 1 pack of cigarettes a day for 1 year (for example, a 30-pack-year history of smoking could mean smoking 1 pack a day for 30 years or 2 packs a day for 15 years). Yearly screening should continue until the smoker has stopped smoking for at least 15 years. Yearly screening should be stopped for people who develop a health problem that would prevent them from having lung cancer treatment. If you choose to  drink alcohol, do not have more than 2 drinks per day. One drink is considered to be 12 oz (360 mL) of beer, 5 oz (150 mL) of wine, or 1.5 oz (45 mL) of liquor. Avoid the use of street drugs. Do not share needles with anyone. Ask for help if you need support or instructions about stopping the use of drugs. High blood pressure causes heart disease and increases the risk of stroke. Blood pressure should be checked at least every 1-2 years. Ongoing high blood pressure should be treated with medicines if weight loss and exercise are not effective. If you are 18-79 years old, ask your health care provider if you should take aspirin to prevent heart disease. Diabetes screening involves taking a blood sample to check your fasting blood sugar level. This should be done once every 3 years after age 16 if you are at a normal weight and without risk factors for diabetes. Testing should be considered at a younger age or be carried out more frequently if you are overweight and have at least 1 risk factor for diabetes. Colorectal cancer can be detected and often prevented. Most routine colorectal cancer screening begins at the age of 13 and continues through age 30. However, your health care provider may recommend screening at an earlier age if you have risk factors for colon cancer. On a yearly basis, your health care provider may provide home test kits to check for hidden blood in the stool. A small camera at the end of a tube may be used to directly examine the colon (sigmoidoscopy or colonoscopy) to detect the earliest forms of colorectal cancer. Talk to your health care provider about this at age 1 when routine screening begins. A direct exam of the colon should be repeated every 5-10 years through age 52, unless early forms of precancerous polyps or small growths are found. People who are at an increased risk for hepatitis B should be screened for this virus. You are considered at high risk for hepatitis B if: You  were born in a country where hepatitis B occurs often. Talk with your health care provider about which countries are considered high risk. Your parents were born in a high-risk country and you have not received a shot to protect against hepatitis B (hepatitis B vaccine). You have HIV or AIDS. You use needles to inject street drugs. You live with, or have sex with, someone who has hepatitis B. You are a man who has sex with other men (MSM). You get hemodialysis treatment. You take certain medicines for conditions like cancer, organ transplantation, and autoimmune conditions. Hepatitis C blood testing is recommended for all people born from [birth year] through 1963 and any individual with known risk factors for hepatitis C. Healthy men should no longer receive prostate-specific antigen (PSA) blood tests as part of routine cancer screening. Talk to your health care provider about prostate cancer screening. Testicular cancer  screening is not recommended for adolescents or adult males who have no symptoms. Screening includes self-exam, a health care provider exam, and other screening tests. Consult with your health care provider about any symptoms you have or any concerns you have about testicular cancer. Practice safe sex. Use condoms and avoid high-risk sexual practices to reduce the spread of sexually transmitted infections (STIs). You should be screened for STIs, including gonorrhea and chlamydia if: You are sexually active and are younger than 24 years. You are older than 24 years, and your health care provider tells you that you are at risk for this type of infection. Your sexual activity has changed since you were last screened, and you are at an increased risk for chlamydia or gonorrhea. Ask your health care provider if you are at risk. If you are at risk of being infected with HIV, it is recommended that you take a prescription medicine daily to prevent HIV infection. This is called pre-exposure  prophylaxis (PrEP). You are considered at risk if: You are a man who has sex with other men (MSM). You are a heterosexual man who is sexually active with multiple partners. You take drugs by injection. You are sexually active with a partner who has HIV. Talk with your health care provider about whether you are at high risk of being infected with HIV. If you choose to begin PrEP, you should first be tested for HIV. You should then be tested every 3 months for as long as you are taking PrEP. Use sunscreen. Apply sunscreen liberally and repeatedly throughout the day. You should seek shade when your shadow is shorter than you. Protect yourself by wearing long sleeves, pants, a wide-brimmed hat, and sunglasses year round whenever you are outdoors. Tell your health care provider of new moles or changes in moles, especially if there is a change in shape or color. Also, tell your health care provider if a mole is larger than the size of a pencil eraser. Stay current with your vaccines (immunizations).

## 2015-01-26 LAB — HEMOGLOBIN A1C
Hgb A1c MFr Bld: 5.8 % — ABNORMAL HIGH (ref <5.7)
MEAN PLASMA GLUCOSE: 120 mg/dL — AB (ref <117)

## 2015-01-26 LAB — VITAMIN D 25 HYDROXY (VIT D DEFICIENCY, FRACTURES): VIT D 25 HYDROXY: 50 ng/mL (ref 30–100)

## 2015-01-26 LAB — INSULIN, RANDOM: Insulin: 18.8 u[IU]/mL (ref 2.0–19.6)

## 2015-04-17 ENCOUNTER — Other Ambulatory Visit: Payer: Self-pay | Admitting: Physician Assistant

## 2015-04-17 DIAGNOSIS — F411 Generalized anxiety disorder: Secondary | ICD-10-CM

## 2015-04-17 DIAGNOSIS — G4700 Insomnia, unspecified: Secondary | ICD-10-CM

## 2015-04-24 ENCOUNTER — Other Ambulatory Visit: Payer: Self-pay | Admitting: Internal Medicine

## 2015-04-27 ENCOUNTER — Ambulatory Visit (INDEPENDENT_AMBULATORY_CARE_PROVIDER_SITE_OTHER): Payer: Medicare Other | Admitting: Internal Medicine

## 2015-04-27 ENCOUNTER — Encounter: Payer: Self-pay | Admitting: Internal Medicine

## 2015-04-27 VITALS — BP 102/58 | HR 84 | Temp 98.0°F | Resp 16 | Ht 70.0 in | Wt 198.0 lb

## 2015-04-27 DIAGNOSIS — E782 Mixed hyperlipidemia: Secondary | ICD-10-CM

## 2015-04-27 DIAGNOSIS — I10 Essential (primary) hypertension: Secondary | ICD-10-CM

## 2015-04-27 DIAGNOSIS — E663 Overweight: Secondary | ICD-10-CM

## 2015-04-27 DIAGNOSIS — Z79899 Other long term (current) drug therapy: Secondary | ICD-10-CM

## 2015-04-27 DIAGNOSIS — Z0000 Encounter for general adult medical examination without abnormal findings: Secondary | ICD-10-CM

## 2015-04-27 DIAGNOSIS — E559 Vitamin D deficiency, unspecified: Secondary | ICD-10-CM | POA: Diagnosis not present

## 2015-04-27 DIAGNOSIS — R7309 Other abnormal glucose: Secondary | ICD-10-CM | POA: Diagnosis not present

## 2015-04-27 DIAGNOSIS — I480 Paroxysmal atrial fibrillation: Secondary | ICD-10-CM

## 2015-04-27 DIAGNOSIS — R7303 Prediabetes: Secondary | ICD-10-CM

## 2015-04-27 LAB — BASIC METABOLIC PANEL WITH GFR
BUN: 13 mg/dL (ref 6–23)
CALCIUM: 9.6 mg/dL (ref 8.4–10.5)
CHLORIDE: 104 meq/L (ref 96–112)
CO2: 28 mEq/L (ref 19–32)
CREATININE: 0.93 mg/dL (ref 0.50–1.35)
GFR, EST AFRICAN AMERICAN: 86 mL/min
GFR, Est Non African American: 74 mL/min
Glucose, Bld: 88 mg/dL (ref 70–99)
Potassium: 4.3 mEq/L (ref 3.5–5.3)
Sodium: 142 mEq/L (ref 135–145)

## 2015-04-27 LAB — CBC WITH DIFFERENTIAL/PLATELET
Basophils Absolute: 0 10*3/uL (ref 0.0–0.1)
Basophils Relative: 0 % (ref 0–1)
EOS ABS: 0.3 10*3/uL (ref 0.0–0.7)
Eosinophils Relative: 3 % (ref 0–5)
HEMATOCRIT: 49 % (ref 39.0–52.0)
Hemoglobin: 16.9 g/dL (ref 13.0–17.0)
Lymphocytes Relative: 27 % (ref 12–46)
Lymphs Abs: 2.4 10*3/uL (ref 0.7–4.0)
MCH: 31.8 pg (ref 26.0–34.0)
MCHC: 34.5 g/dL (ref 30.0–36.0)
MCV: 92.1 fL (ref 78.0–100.0)
MONOS PCT: 5 % (ref 3–12)
MPV: 9.1 fL (ref 8.6–12.4)
Monocytes Absolute: 0.4 10*3/uL (ref 0.1–1.0)
NEUTROS PCT: 65 % (ref 43–77)
Neutro Abs: 5.8 10*3/uL (ref 1.7–7.7)
PLATELETS: 215 10*3/uL (ref 150–400)
RBC: 5.32 MIL/uL (ref 4.22–5.81)
RDW: 13.2 % (ref 11.5–15.5)
WBC: 8.9 10*3/uL (ref 4.0–10.5)

## 2015-04-27 LAB — HEPATIC FUNCTION PANEL
ALK PHOS: 53 U/L (ref 39–117)
ALT: 13 U/L (ref 0–53)
AST: 17 U/L (ref 0–37)
Albumin: 4.1 g/dL (ref 3.5–5.2)
BILIRUBIN DIRECT: 0.1 mg/dL (ref 0.0–0.3)
BILIRUBIN INDIRECT: 0.4 mg/dL (ref 0.2–1.2)
BILIRUBIN TOTAL: 0.5 mg/dL (ref 0.2–1.2)
TOTAL PROTEIN: 6.4 g/dL (ref 6.0–8.3)

## 2015-04-27 LAB — LIPID PANEL
CHOL/HDL RATIO: 2.3 ratio
CHOLESTEROL: 137 mg/dL (ref 0–200)
HDL: 60 mg/dL (ref >=40)
LDL Cholesterol: 51 mg/dL (ref 0–99)
TRIGLYCERIDES: 130 mg/dL (ref <150)
VLDL: 26 mg/dL (ref 0–40)

## 2015-04-27 LAB — MAGNESIUM: Magnesium: 2 mg/dL (ref 1.5–2.5)

## 2015-04-27 NOTE — Progress Notes (Signed)
Assessment and Plan:  Hypertension:  -Continue medication,  -monitor blood pressure at home.  -Continue DASH diet.   -Reminder to go to the ER if any CP, SOB, nausea, dizziness, severe HA, changes vision/speech, left arm numbness and tingling, and jaw pain.  Cholesterol: -Continue diet and exercise.  -Check cholesterol.   Pre-diabetes: -Continue diet and exercise.  -Check A1C  Vitamin D Def: -check level -continue medications.   Continue diet and meds as discussed. Further disposition pending results of labs.  HPI 81 y.o. male  presents for 3 month follow up with hypertension, hyperlipidemia, prediabetes and vitamin D.   His blood pressure has been controlled at home, today their BP is BP: (!) 102/58 mmHg.   He does not workout. He denies chest pain, shortness of breath, dizziness.  He reports tinkering with his rental house and he is doing some gardening.     He is on cholesterol medication and denies myalgias. His cholesterol is at goal. The cholesterol last visit was:   Lab Results  Component Value Date   CHOL 134 01/25/2015   HDL 50 01/25/2015   LDLCALC 62 01/25/2015   TRIG 110 01/25/2015   CHOLHDL 2.7 01/25/2015  He reports that he takes a 1/2 tablet nightly.     He has been working on diet and exercise for prediabetes, and denies foot ulcerations, hyperglycemia, hypoglycemia , increased appetite, nausea, paresthesia of the feet, polydipsia, polyuria, visual disturbances, vomiting and weight loss. Last A1C in the office was:  Lab Results  Component Value Date   HGBA1C 5.8* 01/25/2015    Patient is on Vitamin D supplement.  Lab Results  Component Value Date   VD25OH 50 01/25/2015       Current Medications:  Current Outpatient Prescriptions on File Prior to Visit  Medication Sig Dispense Refill  . aspirin EC 81 MG tablet Take 1 tablet (81 mg total) by mouth daily. 90 tablet 3  . Calcium Carbonate Antacid (TUMS E-X PO) Take 2 tablets by mouth daily.    .  Cholecalciferol 5000 UNITS TABS Take 1 tablet by mouth daily.    . citalopram (CELEXA) 20 MG tablet Take 20 mg by mouth daily.    Marland Kitchen diltiazem (CARDIZEM) 60 MG tablet Take 1 tablet (60 mg total) by mouth 4 (four) times daily as needed. 120 tablet 11  . finasteride (PROSCAR) 5 MG tablet TAKE 1 TABLET BY MOUTH ONCE DAILY 90 tablet 99  . LORazepam (ATIVAN) 2 MG tablet TAKE 1/2 TO 1 TABLET BY MOUTH THREE(3) TIMES DAILY AS DIRECTED 90 tablet 2  . Magnesium 200 MG TABS Take 1 tablet by mouth daily.    . Multiple Vitamin (MULTIVITAMIN) capsule Take 1 capsule by mouth daily.    . Omega-3 Fatty Acids (FISH OIL PO) Take 1 tablet by mouth daily.    . simvastatin (ZOCOR) 40 MG tablet TAKE ONE TABLET BY MOUTH ONE TIME DAILY FOR CHLOSTEROL 30 tablet PRN  . vitamin C (ASCORBIC ACID) 500 MG tablet Take 1,000 mg by mouth daily.     No current facility-administered medications on file prior to visit.    Medical History:  Past Medical History  Diagnosis Date  . Hypertension   . Pre-diabetes   . Dyslipidemia   . OSA (obstructive sleep apnea)   . Atrial fib/flutter, transient     off coumadin since 11/2013- CHADSVAC of 2  . Colon polyps     2003 hyperplastic and tubuler adenoma  . Diverticulosis   . Rectus sheath  hematoma 11/21/2013    Dr. Deatra Ina   . ASHD (arteriosclerotic heart disease)   . Depression     Allergies:  Allergies  Allergen Reactions  . Ciprofloxacin     dysphoria  . Novocain [Procaine Hcl]   . Prednisone     Nervousness     Review of Systems:  Review of Systems  Constitutional: Negative for fever and chills.  HENT: Negative for congestion, ear pain and sore throat.   Eyes: Negative.   Respiratory: Negative for cough, shortness of breath and wheezing.   Cardiovascular: Negative for chest pain, palpitations and leg swelling.  Gastrointestinal: Negative for heartburn, nausea, vomiting, diarrhea, constipation, blood in stool and melena.  Genitourinary: Negative.   Skin:  Negative.   Neurological: Negative for dizziness and headaches.  Psychiatric/Behavioral: Negative for depression. The patient is nervous/anxious. The patient does not have insomnia.     Family history- Review and unchanged  Social history- Review and unchanged  Physical Exam: BP 102/58 mmHg  Pulse 84  Temp(Src) 98 F (36.7 C) (Temporal)  Resp 16  Ht 5\' 10"  (1.778 m)  Wt 198 lb (89.812 kg)  BMI 28.41 kg/m2 Wt Readings from Last 3 Encounters:  04/27/15 198 lb (89.812 kg)  01/25/15 194 lb 3.2 oz (88.089 kg)  11/09/14 193 lb 8 oz (87.771 kg)    General Appearance: Well nourished well developed, in no apparent distress. Eyes: PERRLA, EOMs, conjunctiva no swelling or erythema ENT/Mouth: Ear canals normal without obstruction, swelling, erythma, discharge.  TMs normal bilaterally.  Oropharynx moist, clear, without exudate, or postoropharyngeal swelling. Neck: Supple, thyroid normal,no cervical adenopathy  Respiratory: Respiratory effort normal, Breath sounds clear A&P without rhonchi, wheeze, or rale.  No retractions, no accessory usage. Cardio: RRR with no MRGs. Brisk peripheral pulses without edema.  Abdomen: Soft, + BS,  Non tender, no guarding, rebound, hernias, masses. Musculoskeletal: Full ROM, 5/5 strength, Normal gait Skin: Warm, dry without rashes, lesions, ecchymosis.  Neuro: Awake and oriented X 3, Cranial nerves intact. Normal muscle tone, no cerebellar symptoms. Psych: Normal affect, Insight and Judgment appropriate.    Starlyn Skeans, PA-C 2:26 PM [facility]

## 2015-04-27 NOTE — Patient Instructions (Signed)
Please start taking Zocor or your simvistatin which is your cholesterol medication Monday, Wednesday, Friday OR Tuesday, Thursday, Saturday.

## 2015-04-28 LAB — VITAMIN D 25 HYDROXY (VIT D DEFICIENCY, FRACTURES): VIT D 25 HYDROXY: 55 ng/mL (ref 30–100)

## 2015-04-28 LAB — HEMOGLOBIN A1C
Hgb A1c MFr Bld: 5.8 % — ABNORMAL HIGH (ref <5.7)
Mean Plasma Glucose: 120 mg/dL — ABNORMAL HIGH (ref <117)

## 2015-04-28 LAB — TSH: TSH: 1.71 u[IU]/mL (ref 0.350–4.500)

## 2015-04-28 LAB — INSULIN, RANDOM: Insulin: 29.2 u[IU]/mL — ABNORMAL HIGH (ref 2.0–19.6)

## 2015-07-07 ENCOUNTER — Encounter: Payer: Self-pay | Admitting: Internal Medicine

## 2015-08-07 ENCOUNTER — Other Ambulatory Visit: Payer: Self-pay | Admitting: Internal Medicine

## 2015-08-21 ENCOUNTER — Encounter: Payer: Self-pay | Admitting: Internal Medicine

## 2015-08-21 DIAGNOSIS — Z6826 Body mass index (BMI) 26.0-26.9, adult: Secondary | ICD-10-CM

## 2015-08-21 DIAGNOSIS — E663 Overweight: Secondary | ICD-10-CM | POA: Insufficient documentation

## 2015-08-21 DIAGNOSIS — Z6828 Body mass index (BMI) 28.0-28.9, adult: Secondary | ICD-10-CM | POA: Insufficient documentation

## 2015-08-21 NOTE — Patient Instructions (Signed)

## 2015-08-21 NOTE — Progress Notes (Signed)
Patient ID: [name] Yu, male   DOB: [birth year], 81 y.o.   MRN: [id number]  Annual  Medicare Screening/Preventative Visit and  Comprehensive Evaluation,  Examination & Management  This very nice 81 y.o. MWM presents for presents for a Wellness/Preventative Visit & comprehensive evaluation and management of multiple medical co-morbidities.  Patient has been followed for HTN, Prediabetes, Hyperlipidemia, and Vitamin D Deficiency.   HTN predates since 2005. In 2007 he was found in [employer] and was on coumadin until feb 2015 he developed a large rectal sheath hematoma and since has been on only ASA.  Patient's BP has been controlled at home.Today's BP: 126/76 mmHg. Patient denies any cardiac symptoms as chest pain, palpitations, shortness of breath, dizziness or ankle swelling. Patient had been followed by Dr Johnsie Cancel and then Dr Rockey Situ of [facility].    Patient's hyperlipidemia is controlled with diet and medications. Patient denies myalgias or other medication SE's. Last lipids were at goal with Cholesterol 137; HDL 60; LDL 51; Triglycerides 130 on 04/27/2015.   Patient has prediabetes since 2014 with A1c 5.7%  and patient denies reactive hypoglycemic symptoms, visual blurring, diabetic polys or paresthesias. Last A1c was 5.8% on 04/27/2015.   Finally, patient has history of Vitamin D Deficiency of 33 in 2008 and last vitamin D was 55 on 04/27/2015.  Medication Sig  . aspirin EC 81 MG tablet Take 1 tablet (81 mg total) by mouth daily.  . TUMS E-X  Take 2 tablets by mouth daily.  . Cholecalciferol 5000 UNITS TABS Take 1 tablet by mouth daily.  . citalopram (CELEXA) 20 MG tablet Take 20 mg by mouth daily.  . citalopram (CELEXA) 40 MG tablet TAKE ONE TABLET BY MOUTH ONCE DAILY.  Marland Kitchen diltiazem ( 60 MG tablet Take 1 tablet (60 mg total) by mouth 4 (four) times daily as needed.  . finasteride  5 MG tablet TAKE 1 TABLET BY MOUTH ONCE DAILY  . LORazepam  2 MG tablet TAKE 1/2 TO 1 TABLET BY MOUTH THREE(3) TIMES  DAILY AS DIRECTED  . Magnesium 200 MG TABS Take 1 tablet by mouth daily.  . Multiple Vitamin   Take 1 capsule by mouth daily.  . Omega-3 FISH OIL Take 1 tablet by mouth daily.  . Simvastatin 40 MG tablet TAKE ONE TABLET BY MOUTH ONE TIME DAILY FOR CHLOSTEROL  . vitamin C  500 MG tablet Take 1,000 mg by mouth daily.   Allergies  Allergen Reactions  . Ciprofloxacin     dysphoria  . Novocain [Procaine Hcl]   . Prednisone     Nervousness   Past Medical History  Diagnosis Date  . Hypertension   . Pre-diabetes   . Dyslipidemia   . OSA (obstructive sleep apnea)   . Atrial fib/flutter, transient     off coumadin since 11/2013- CHADSVAC of 2  . Colon polyps     2003 hyperplastic and tubuler adenoma  . Diverticulosis   . Rectus sheath hematoma 11/21/2013    Dr. Deatra Ina   . ASHD (arteriosclerotic heart disease)   . Depression    Health Maintenance  Topic Date Due  . TETANUS/TDAP  01/01/1946  . INFLUENZA VACCINE  05/16/2015  . ZOSTAVAX  Completed  . PNA vac Low Risk Adult  Completed   Immunization History  Administered Date(s) Administered  . DT 07/06/2014  . Influenza Split 07/20/2014  . Influenza, High Dose Seasonal PF 08/22/2015  . Pneumococcal Conjugate-13 07/06/2014  . Pneumococcal Polysaccharide-23 06/09/2013  . Zoster 10/15/2005  Past Surgical History  Procedure Laterality Date  . Hernia repair     Family History  Problem Relation Age of Onset  . Diabetes Brother   . Alzheimer's disease Mother   . Breast cancer Sister   . Rectal cancer Sister   . Breast cancer Daughter   . Ulcerative colitis Sister   . Heart disease Brother   . Heart disease Sister   . Kidney cancer Father     Social History   Social History  . Marital Status: Married    Spouse Name: N/A  . Number of Children: 5  . Years of Education: N/A   Occupational History  . Retired    Social History Main Topics  . Smoking status: Former Smoker    Quit date: 09/14/1993  . Smokeless tobacco:  Never Used  . Alcohol Use: No  . Drug Use: No  . Sexual Activity: Not on file    ROS Constitutional: Denies fever, chills, weight loss/gain, headaches, insomnia,  night sweats or change in appetite. Does c/o fatigue. Eyes: Denies redness, blurred vision, diplopia, discharge, itchy or watery eyes.  ENT: Denies discharge, congestion, post nasal drip, epistaxis, sore throat, earache, hearing loss, dental pain, Tinnitus, Vertigo, Sinus pain or snoring.  Cardio: Denies chest pain, palpitations, irregular heartbeat, syncope, dyspnea, diaphoresis, orthopnea, PND, claudication or edema Respiratory: denies cough, dyspnea, DOE, pleurisy, hoarseness, laryngitis or wheezing.  Gastrointestinal: Denies dysphagia, heartburn, reflux, water brash, pain, cramps, nausea, vomiting, bloating, diarrhea, constipation, hematemesis, melena, hematochezia, jaundice or hemorrhoids Genitourinary: Denies dysuria, frequency, urgency, nocturia, hesitancy, discharge, hematuria or flank pain Musculoskeletal: Denies arthralgia, myalgia, stiffness, Jt. Swelling, pain, limp or strain/sprain. Denies Falls. Skin: Denies puritis, rash, hives, warts, acne, eczema or change in skin lesion Neuro: No weakness, tremor, incoordination, spasms, paresthesia or pain Psychiatric: Denies confusion, memory loss or sensory loss. Denies Depression. Endocrine: Denies change in weight, skin, hair change, nocturia, and paresthesia, diabetic polys, visual blurring or hyper / hypo glycemic episodes.  Heme/Lymph: No excessive bleeding, bruising or enlarged lymph nodes.  Physical Exam  BP 126/76 mmHg  Pulse 54  Temp(Src) 98 F (36.7 C) (Temporal)  Resp 16  Ht 5' 9.5" (1.765 m)  Wt 194 lb (87.998 kg)  BMI 28.25 kg/m2  General Appearance: Well nourished, in no apparent distress. Eyes: PERRLA, EOMs, conjunctiva no swelling or erythema, normal fundi and vessels. Sinuses: No frontal/maxillary tenderness ENT/Mouth: EACs patent / TMs  nl. Nares  clear without erythema, swelling, mucoid exudates. Oral hygiene is good. No erythema, swelling, or exudate. Tongue normal, non-obstructing. Tonsils not swollen or erythematous. Hearing normal.  Neck: Supple, thyroid normal. No bruits, nodes or JVD. Respiratory: Respiratory effort normal.  BS equal and clear bilateral without rales, rhonci, wheezing or stridor. Cardio: Heart sounds are normal with regular rate and rhythm and no murmurs, rubs or gallops. Peripheral pulses are normal and equal bilaterally without edema. No aortic or femoral bruits. Chest: symmetric with normal excursions and percussion.  Abdomen: Flat, soft, with bowl sounds. Nontender, no guarding, rebound, hernias, masses, or organomegaly.  Lymphatics: Non tender without lymphadenopathy.  Genitourinary: No hernias.Testes nl. DRE - prostate nl for age - smooth & firm w/o nodules. Musculoskeletal: Full ROM all peripheral extremities, joint stability, 5/5 strength, and normal gait. Skin: Warm and dry without rashes, lesions, cyanosis, clubbing or  ecchymosis.  Neuro: Cranial nerves intact, reflexes equal bilaterally. Normal muscle tone, no cerebellar symptoms. Sensation intact.  Pysch: Awake and oriented X 3 with normal affect, insight and judgment appropriate.  Assessment and Plan  1. Annual Preventative/Screening Exam    2. Essential hypertension  - Microalbumin / creatinine urine ratio - EKG 12-Lead - [place], RETROPERITNL ABD,  LTD - TSH  3. Mixed hyperlipidemia  - Lipid panel  4. Pre-diabetes  - Hemoglobin A1c - Insulin, random  5. Vitamin D deficiency  - Vit D  25 hydroxy   6. OSA (obstructive sleep apnea)   7. ASHD (arteriosclerotic heart disease)   8. Atrial fibrillation, unspecified type ([place])   9. Depression, controlled   10. Depression screen   11. BMI 28.0-28.9,adult   12. Screening for rectal cancer  - POC Hemoccult Bld/Stl  13. Prostate cancer screening  - PSA  14. At low risk for  fall   15. Need for prophylactic vaccination and inoculation against influenza  - Flu vaccine HIGH DOSE PF (Fluzone High dose)  16. Medication management  - Urinalysis, Routine w reflex microscopic (not at [facility]) - CBC with Differential/Platelet - BASIC METABOLIC PANEL WITH GFR - Hepatic function panel - Magnesium   Continue prudent diet as discussed, weight control, BP monitoring, regular exercise, and medications as discussed.  Discussed med effects and SE's. Routine screening labs and tests as requested with regular follow-up as recommended.

## 2015-08-22 ENCOUNTER — Encounter: Payer: Self-pay | Admitting: Internal Medicine

## 2015-08-22 ENCOUNTER — Ambulatory Visit (INDEPENDENT_AMBULATORY_CARE_PROVIDER_SITE_OTHER): Payer: Medicare Other | Admitting: Internal Medicine

## 2015-08-22 VITALS — BP 126/76 | HR 54 | Temp 98.0°F | Resp 16 | Ht 69.5 in | Wt 194.0 lb

## 2015-08-22 DIAGNOSIS — Z789 Other specified health status: Secondary | ICD-10-CM | POA: Diagnosis not present

## 2015-08-22 DIAGNOSIS — F329 Major depressive disorder, single episode, unspecified: Secondary | ICD-10-CM | POA: Diagnosis not present

## 2015-08-22 DIAGNOSIS — Z1331 Encounter for screening for depression: Secondary | ICD-10-CM

## 2015-08-22 DIAGNOSIS — Z0000 Encounter for general adult medical examination without abnormal findings: Secondary | ICD-10-CM

## 2015-08-22 DIAGNOSIS — E559 Vitamin D deficiency, unspecified: Secondary | ICD-10-CM

## 2015-08-22 DIAGNOSIS — Z23 Encounter for immunization: Secondary | ICD-10-CM

## 2015-08-22 DIAGNOSIS — E782 Mixed hyperlipidemia: Secondary | ICD-10-CM | POA: Diagnosis not present

## 2015-08-22 DIAGNOSIS — R7309 Other abnormal glucose: Secondary | ICD-10-CM | POA: Diagnosis not present

## 2015-08-22 DIAGNOSIS — Z1389 Encounter for screening for other disorder: Secondary | ICD-10-CM

## 2015-08-22 DIAGNOSIS — I10 Essential (primary) hypertension: Secondary | ICD-10-CM | POA: Diagnosis not present

## 2015-08-22 DIAGNOSIS — R7303 Prediabetes: Secondary | ICD-10-CM | POA: Diagnosis not present

## 2015-08-22 DIAGNOSIS — Z1212 Encounter for screening for malignant neoplasm of rectum: Secondary | ICD-10-CM

## 2015-08-22 DIAGNOSIS — Z9181 History of falling: Secondary | ICD-10-CM

## 2015-08-22 DIAGNOSIS — I2510 Atherosclerotic heart disease of native coronary artery without angina pectoris: Secondary | ICD-10-CM

## 2015-08-22 DIAGNOSIS — Z125 Encounter for screening for malignant neoplasm of prostate: Secondary | ICD-10-CM | POA: Diagnosis not present

## 2015-08-22 DIAGNOSIS — G4733 Obstructive sleep apnea (adult) (pediatric): Secondary | ICD-10-CM

## 2015-08-22 DIAGNOSIS — Z79899 Other long term (current) drug therapy: Secondary | ICD-10-CM

## 2015-08-22 DIAGNOSIS — Z6828 Body mass index (BMI) 28.0-28.9, adult: Secondary | ICD-10-CM

## 2015-08-22 DIAGNOSIS — I4891 Unspecified atrial fibrillation: Secondary | ICD-10-CM

## 2015-08-22 DIAGNOSIS — F32A Depression, unspecified: Secondary | ICD-10-CM

## 2015-08-23 LAB — CBC WITH DIFFERENTIAL/PLATELET
Basophils Absolute: 0 10*3/uL (ref 0.0–0.1)
Basophils Relative: 0 % (ref 0–1)
EOS PCT: 3 % (ref 0–5)
Eosinophils Absolute: 0.3 10*3/uL (ref 0.0–0.7)
HEMATOCRIT: 47.1 % (ref 39.0–52.0)
Hemoglobin: 16.5 g/dL (ref 13.0–17.0)
LYMPHS ABS: 2.2 10*3/uL (ref 0.7–4.0)
LYMPHS PCT: 23 % (ref 12–46)
MCH: 32.3 pg (ref 26.0–34.0)
MCHC: 35 g/dL (ref 30.0–36.0)
MCV: 92.2 fL (ref 78.0–100.0)
MONO ABS: 0.8 10*3/uL (ref 0.1–1.0)
MONOS PCT: 8 % (ref 3–12)
MPV: 9.1 fL (ref 8.6–12.4)
Neutro Abs: 6.4 10*3/uL (ref 1.7–7.7)
Neutrophils Relative %: 66 % (ref 43–77)
PLATELETS: 238 10*3/uL (ref 150–400)
RBC: 5.11 MIL/uL (ref 4.22–5.81)
RDW: 13.7 % (ref 11.5–15.5)
WBC: 9.7 10*3/uL (ref 4.0–10.5)

## 2015-08-23 LAB — LIPID PANEL
Cholesterol: 163 mg/dL (ref 125–200)
HDL: 53 mg/dL (ref >=40)
LDL Cholesterol: 83 mg/dL (ref <130)
Total CHOL/HDL Ratio: 3.1 Ratio (ref <=5.0)
Triglycerides: 133 mg/dL (ref <150)
VLDL: 27 mg/dL (ref <30)

## 2015-08-23 LAB — URINALYSIS, ROUTINE W REFLEX MICROSCOPIC
BILIRUBIN URINE: NEGATIVE
Glucose, UA: NEGATIVE
Hgb urine dipstick: NEGATIVE
Leukocytes, UA: NEGATIVE
Nitrite: NEGATIVE
PH: 6.5 (ref 5.0–8.0)
Protein, ur: NEGATIVE
Specific Gravity, Urine: 1.02 (ref 1.001–1.035)

## 2015-08-23 LAB — BASIC METABOLIC PANEL WITH GFR
BUN: 11 mg/dL (ref 7–25)
CHLORIDE: 103 mmol/L (ref 98–110)
CO2: 26 mmol/L (ref 20–31)
CREATININE: 0.8 mg/dL (ref 0.70–1.11)
Calcium: 9.7 mg/dL (ref 8.6–10.3)
GFR, Est African American: >89 mL/min (ref >=60)
GFR, Est Non African American: 81 mL/min (ref >=60)
Glucose, Bld: 76 mg/dL (ref 65–99)
Potassium: 4.4 mmol/L (ref 3.5–5.3)
Sodium: 140 mmol/L (ref 135–146)

## 2015-08-23 LAB — HEMOGLOBIN A1C
HEMOGLOBIN A1C: 5.8 % — AB (ref <5.7)
MEAN PLASMA GLUCOSE: 120 mg/dL — AB (ref <117)

## 2015-08-23 LAB — MICROALBUMIN / CREATININE URINE RATIO
CREATININE, URINE: 177 mg/dL (ref 20–370)
MICROALB UR: 1.4 mg/dL
Microalb Creat Ratio: 8 mcg/mg creat (ref <30)

## 2015-08-23 LAB — MAGNESIUM: Magnesium: 2 mg/dL (ref 1.5–2.5)

## 2015-08-23 LAB — HEPATIC FUNCTION PANEL
ALT: 11 U/L (ref 9–46)
AST: 17 U/L (ref 10–35)
Albumin: 4.1 g/dL (ref 3.6–5.1)
Alkaline Phosphatase: 62 U/L (ref 40–115)
Bilirubin, Direct: 0.1 mg/dL
Indirect Bilirubin: 0.4 mg/dL (ref 0.2–1.2)
Total Bilirubin: 0.5 mg/dL (ref 0.2–1.2)
Total Protein: 6.3 g/dL (ref 6.1–8.1)

## 2015-08-23 LAB — INSULIN, RANDOM: Insulin: 8.3 u[IU]/mL (ref 2.0–19.6)

## 2015-08-23 LAB — TSH: TSH: 2.063 u[IU]/mL (ref 0.350–4.500)

## 2015-08-23 LAB — VITAMIN D 25 HYDROXY (VIT D DEFICIENCY, FRACTURES): Vit D, 25-Hydroxy: 59 ng/mL (ref 30–100)

## 2015-08-23 LAB — PSA: PSA: 1.46 ng/mL (ref <=4.00)

## 2015-10-11 ENCOUNTER — Ambulatory Visit (INDEPENDENT_AMBULATORY_CARE_PROVIDER_SITE_OTHER): Payer: Medicare Other | Admitting: Physician Assistant

## 2015-10-11 ENCOUNTER — Encounter: Payer: Self-pay | Admitting: Physician Assistant

## 2015-10-11 VITALS — BP 130/80 | HR 81 | Temp 97.0°F | Resp 14 | Ht 69.5 in | Wt 196.0 lb

## 2015-10-11 DIAGNOSIS — C4491 Basal cell carcinoma of skin, unspecified: Secondary | ICD-10-CM | POA: Diagnosis not present

## 2015-10-11 MED ORDER — NEOMYCIN-POLYMYXIN-DEXAMETH 3.5-10000-0.1 OP SUSP: 1.0000 [drp] | Freq: Four times a day (QID) | OPHTHALMIC | Status: DC

## 2015-10-11 MED ORDER — SULFAMETHOXAZOLE-TRIMETHOPRIM 800-160 MG PO TABS: 1.0000 | ORAL_TABLET | Freq: Two times a day (BID) | ORAL | Status: DC

## 2015-10-11 NOTE — Progress Notes (Signed)
Subjective:    Patient ID: [name] Yu, male    DOB: 1926/12/22, 81 y.o.   MRN: [id number]  HPI 81 y.o. right handed WM with history of basal cell carcinoma presents with tender nodule on right hand x Nov. States that he bumped his right hand on corner of a desk around thanksgiving, did not bleed, started out as a small pimple but then has gradually increased in size x 1 month. Now he states has large, tender nodule on right hand, mild redness, no warmth, swelling, numbness, decrease strength. No fever or chills, nausea. He does complain of bilateral yellow crust in his eyes when he wakes up x 1 week, on zyrtec, no other symptoms.   Blood pressure 130/80, pulse 81, temperature 97 F (36.1 C), temperature source Temporal, resp. rate 14, height 5' 9.5" (1.765 m), weight 196 lb (88.905 kg), SpO2 97 %.  Past Medical History  Diagnosis Date  . Hypertension   . Pre-diabetes   . Dyslipidemia   . OSA (obstructive sleep apnea)   . Atrial fib/flutter, transient     off coumadin since 11/2013- CHADSVAC of 2  . Colon polyps     2003 hyperplastic and tubuler adenoma  . Diverticulosis   . Rectus sheath hematoma 11/21/2013    Dr. Deatra Ina   . ASHD (arteriosclerotic heart disease)   . Depression    Current Outpatient Prescriptions on File Prior to Visit  Medication Sig Dispense Refill  . aspirin EC 81 MG tablet Take 1 tablet (81 mg total) by mouth daily. 90 tablet 3  . Calcium Carbonate Antacid (TUMS E-X PO) Take 2 tablets by mouth daily.    . Cholecalciferol 5000 UNITS TABS Take 1 tablet by mouth daily.    . citalopram (CELEXA) 40 MG tablet TAKE ONE TABLET BY MOUTH ONCE DAILY. 90 tablet 1  . diltiazem (CARDIZEM) 60 MG tablet Take 1 tablet (60 mg total) by mouth 4 (four) times daily as needed. 120 tablet 11  . finasteride (PROSCAR) 5 MG tablet TAKE 1 TABLET BY MOUTH ONCE DAILY 90 tablet 99  . LORazepam (ATIVAN) 2 MG tablet TAKE 1/2 TO 1 TABLET BY MOUTH THREE(3) TIMES DAILY AS DIRECTED 90 tablet 2   . Magnesium 200 MG TABS Take 1 tablet by mouth daily.    . Multiple Vitamin (MULTIVITAMIN) capsule Take 1 capsule by mouth daily.    . Omega-3 Fatty Acids (FISH OIL PO) Take 1 tablet by mouth daily.    . simvastatin (ZOCOR) 40 MG tablet TAKE ONE TABLET BY MOUTH ONE TIME DAILY FOR CHLOSTEROL 30 tablet PRN  . vitamin C (ASCORBIC ACID) 500 MG tablet Take 1,000 mg by mouth daily.     No current facility-administered medications on file prior to visit.   Review of Systems  Constitutional: Negative.  Negative for chills, appetite change, fatigue and unexpected weight change.  HENT: Negative.  Negative for congestion.   Eyes: Positive for discharge and redness. Negative for photophobia, pain, itching and visual disturbance.  Respiratory: Negative.   Cardiovascular: Negative.   Gastrointestinal: Negative.   Genitourinary: Negative.   Musculoskeletal: Negative.   Skin: Positive for wound.       Objective:   Physical Exam  Constitutional: He appears well-developed and well-nourished. No distress.  HENT:  Head: Normocephalic and atraumatic.  Right Ear: External ear normal.  Left Ear: External ear normal.  Mouth/Throat: Oropharynx is clear and moist. No oropharyngeal exudate.  Eyes: Conjunctivae and EOM are normal. Pupils are equal, round, and  reactive to light. Right eye exhibits no discharge. Left eye exhibits no discharge.  Neck: Normal range of motion.  Skin:  Right dorsum of hand 1.2 x 1.2 cm nodule, hard, tender, with increased vasculature with some mild surrounding erythema, no fluctuance, has good distal neurovascular, good grip strength        Assessment & Plan:  Right hand nodule-  Will treat with bactrim however very concerning for malignancy/BCC- will refer to skin surgery center for removal especially with location on right hand

## 2015-10-11 NOTE — Patient Instructions (Signed)
Basal Cell Carcinoma Basal cell carcinoma is the most common form of skin cancer. It begins in the basal cells, which are at the bottom of the outer skin layer (epidermis). It occurs most often on parts of the body that are frequently exposed to the sun, such as:  Parts of the head, including the scalp or face.  Ears.  Neck.  Arms or legs.  Backs of the hands. Basal cell carcinoma can almost always be cured. It rarely spreads to other areas of the body (metastasizes). Basal cell carcinoma may come back at the same location (recur), but it can be treated again if this occurs. CAUSES This condition is usually caused by exposure to ultraviolet (UV) light. UV light may come from the sun or from tanning beds. Other causes include:  Exposure to arsenic.  Exposure to radiation.  Exposure to toxic tars and oils.  Certain genetic conditions, such as xeroderma pigmentosum. RISK FACTORS This condition is more likely to develop in:  People who are older than 79 years of age.  People who have fair skin (light complexion).  People who have blonde or red hair.  People who have blue, green, or gray eyes.  People who have childhood freckling.  People who have had sun exposure over long periods of time, especially during childhood.  People who have had repeated sunburns.  People who have a weakened immune system.  People who have been exposed to certain chemicals, such as tar, soot, and arsenic.  People who have chronic inflammatory conditions.  People who have chronic infections.  People who use tanning beds. SYMPTOMS The main symptom of this condition is a growth or lesion on the skin. The shape and color of the growth or lesion may vary. The five main types include:  An open sore that may remain open for 3 weeks or longer. The sore may bleed or crust. This type of lesion can be an early sign of basal cell carcinoma. Basal cell carcinoma often shows up as a sore that does not  heal.  A reddish area that may crust, itch, or cause discomfort. This may occur on areas that are exposed to the sun. These patches might be easier to feel than to see.  A shiny or clear bump that is red, white, or pink. In people who have dark hair, the bump is often tan, black, or brown. These bumps can look like moles.  A pink growth with a raised border. The growth will have a crusted and indented area in the center. Small blood vessels may appear on the surface of the growth as it gets bigger.  A scarlike area that looks like shiny, stretched skin. The area may be white, yellow, or waxy. It often has irregular borders. This may be a sign of more aggressive basal cell carcinoma. DIAGNOSIS This condition may be diagnosed with:  A physical exam.  Removal of a tissue sample to be examined under a microscope (biopsy). TREATMENT Treatment for this condition involves removing the cancerous tissue. The method that is used for this depends on the type, size, location, and number of tumors. Possible treatments include:  Mohs surgery. In this procedure, the cancerous skin cells are removed layer by layer until all of the tumor has been removed.  Surgical removal (excision) of the tumor. This involves removing the entire tumor and a small amount of normal skin that surrounds it.  Cryosurgery. This involves freezing the tumor with liquid nitrogen.  Plastic surgery. The tumor is  removed, and healthy skin from another part of the body is used to cover the wound. This may be done for large tumors that are in areas where it is not possible to stretch the nearby skin to sew the edges of the wound together.  Radiation. This may be used for tumors on the face.  Photodynamic therapy. A chemical cream is applied to the skin, and light exposure is used to activate the chemical.  Electrodesiccation and curettage. This involves alternately scraping and burning the tumor while using an electric current to  control bleeding.  Chemical treatments, such as imiquimod cream and interferon injections. These may be used to remove superficial tumors with minimal scarring. HOME CARE INSTRUCTIONS  Avoid unprotected sun exposure.  Do self-exams as told by your health care provider. Look for new spots or changes in your skin.  Keep all follow-up visits as told by your health care provider. This is important. PREVENTION  Avoid the sun when it is the strongest. This is usually between 10:00 a.m. and 4:00 p.m.  When you are out in the sun, use a sunscreen that has a sun protection factor (SPF) of at least 28.  Apply sunscreen at least 30 minutes before exposure to the sun.  Reapply sunscreen every 2-4 hours while you are outside. Also reapply it after swimming and after excessive sweating.  Always wear hats, protective clothing, and UV-blocking sunglasses when you are outdoors.  Do not use tanning beds. SEEK MEDICAL CARE IF:  You notice any new spots or any changes in your skin.  You have had a basal cell carcinoma tumor removed and you notice a new growth in the same location.   This information is not intended to replace advice given to you by your health care provider. Make sure you discuss any questions you have with your health care provider.   Document Released: 04/07/2003 Document Revised: 06/22/2015 Document Reviewed: 01/24/2015 Elsevier Interactive Patient Education 2016 [employer]    Abscess An abscess is an infected area that contains a collection of pus and debris.It can occur in almost any part of the body. An abscess is also known as a furuncle or boil. CAUSES  An abscess occurs when tissue gets infected. This can occur from blockage of oil or sweat glands, infection of hair follicles, or a minor injury to the skin. As the body tries to fight the infection, pus collects in the area and creates pressure under the skin. This pressure causes pain. People with weakened immune  systems have difficulty fighting infections and get certain abscesses more often.  SYMPTOMS Usually an abscess develops on the skin and becomes a painful mass that is red, warm, and tender. If the abscess forms under the skin, you may feel a moveable soft area under the skin. Some abscesses break open (rupture) on their own, but most will continue to get worse without care. The infection can spread deeper into the body and eventually into the bloodstream, causing you to feel ill.  DIAGNOSIS  Your caregiver will take your medical history and perform a physical exam. A sample of fluid may also be taken from the abscess to determine what is causing your infection. TREATMENT  Your caregiver may prescribe antibiotic medicines to fight the infection. However, taking antibiotics alone usually does not cure an abscess. Your caregiver may need to make a small cut (incision) in the abscess to drain the pus. In some cases, gauze is packed into the abscess to reduce pain and to  continue draining the area. HOME CARE INSTRUCTIONS   Only take over-the-counter or prescription medicines for pain, discomfort, or fever as directed by your caregiver.  If you were prescribed antibiotics, take them as directed. Finish them even if you start to feel better.  If gauze is used, follow your caregiver's directions for changing the gauze.  To avoid spreading the infection:  Keep your draining abscess covered with a bandage.  Wash your hands well.  Do not share personal care items, towels, or whirlpools with others.  Avoid skin contact with others.  Keep your skin and clothes clean around the abscess.  Keep all follow-up appointments as directed by your caregiver. SEEK MEDICAL CARE IF:   You have increased pain, swelling, redness, fluid drainage, or bleeding.  You have muscle aches, chills, or a general ill feeling.  You have a fever. MAKE SURE YOU:   Understand these instructions.  Will watch your  condition.  Will get help right away if you are not doing well or get worse.   This information is not intended to replace advice given to you by your health care provider. Make sure you discuss any questions you have with your health care provider.   Document Released: 07/11/2005 Document Revised: 04/01/2012 Document Reviewed: 12/14/2011 Elsevier Interactive Patient Education [employer].

## 2015-10-12 ENCOUNTER — Encounter: Payer: Self-pay | Admitting: Internal Medicine

## 2015-11-22 ENCOUNTER — Ambulatory Visit (INDEPENDENT_AMBULATORY_CARE_PROVIDER_SITE_OTHER): Payer: Medicare Other | Admitting: Internal Medicine

## 2015-11-22 ENCOUNTER — Encounter: Payer: Self-pay | Admitting: Internal Medicine

## 2015-11-22 VITALS — BP 138/80 | HR 70 | Temp 98.2°F | Resp 16 | Ht 69.5 in | Wt 194.0 lb

## 2015-11-22 DIAGNOSIS — F329 Major depressive disorder, single episode, unspecified: Secondary | ICD-10-CM

## 2015-11-22 DIAGNOSIS — E782 Mixed hyperlipidemia: Secondary | ICD-10-CM

## 2015-11-22 DIAGNOSIS — Z860100 Personal history of colon polyps, unspecified: Secondary | ICD-10-CM

## 2015-11-22 DIAGNOSIS — Z0001 Encounter for general adult medical examination with abnormal findings: Secondary | ICD-10-CM

## 2015-11-22 DIAGNOSIS — I10 Essential (primary) hypertension: Secondary | ICD-10-CM | POA: Diagnosis not present

## 2015-11-22 DIAGNOSIS — Z0000 Encounter for general adult medical examination without abnormal findings: Secondary | ICD-10-CM

## 2015-11-22 DIAGNOSIS — I4891 Unspecified atrial fibrillation: Secondary | ICD-10-CM | POA: Diagnosis not present

## 2015-11-22 DIAGNOSIS — R7303 Prediabetes: Secondary | ICD-10-CM

## 2015-11-22 DIAGNOSIS — K5730 Diverticulosis of large intestine without perforation or abscess without bleeding: Secondary | ICD-10-CM

## 2015-11-22 DIAGNOSIS — Z79899 Other long term (current) drug therapy: Secondary | ICD-10-CM | POA: Diagnosis not present

## 2015-11-22 DIAGNOSIS — G4733 Obstructive sleep apnea (adult) (pediatric): Secondary | ICD-10-CM

## 2015-11-22 DIAGNOSIS — Z6828 Body mass index (BMI) 28.0-28.9, adult: Secondary | ICD-10-CM

## 2015-11-22 DIAGNOSIS — S301XXD Contusion of abdominal wall, subsequent encounter: Secondary | ICD-10-CM

## 2015-11-22 DIAGNOSIS — R6889 Other general symptoms and signs: Secondary | ICD-10-CM

## 2015-11-22 DIAGNOSIS — E559 Vitamin D deficiency, unspecified: Secondary | ICD-10-CM

## 2015-11-22 DIAGNOSIS — F32A Depression, unspecified: Secondary | ICD-10-CM

## 2015-11-22 DIAGNOSIS — I2510 Atherosclerotic heart disease of native coronary artery without angina pectoris: Secondary | ICD-10-CM | POA: Diagnosis not present

## 2015-11-22 DIAGNOSIS — R7309 Other abnormal glucose: Secondary | ICD-10-CM | POA: Diagnosis not present

## 2015-11-22 LAB — CBC WITH DIFFERENTIAL/PLATELET
Basophils Absolute: 0 10*3/uL (ref 0.0–0.1)
Basophils Relative: 0 % (ref 0–1)
Eosinophils Absolute: 0.2 10*3/uL (ref 0.0–0.7)
Eosinophils Relative: 3 % (ref 0–5)
HEMATOCRIT: 46.8 % (ref 39.0–52.0)
HEMOGLOBIN: 15.8 g/dL (ref 13.0–17.0)
LYMPHS ABS: 1.9 10*3/uL (ref 0.7–4.0)
LYMPHS PCT: 24 % (ref 12–46)
MCH: 31.3 pg (ref 26.0–34.0)
MCHC: 33.8 g/dL (ref 30.0–36.0)
MCV: 92.9 fL (ref 78.0–100.0)
MONO ABS: 0.7 10*3/uL (ref 0.1–1.0)
MONOS PCT: 9 % (ref 3–12)
MPV: 9.4 fL (ref 8.6–12.4)
NEUTROS ABS: 5.2 10*3/uL (ref 1.7–7.7)
Neutrophils Relative %: 64 % (ref 43–77)
Platelets: 216 10*3/uL (ref 150–400)
RBC: 5.04 MIL/uL (ref 4.22–5.81)
RDW: 13.3 % (ref 11.5–15.5)
WBC: 8.1 10*3/uL (ref 4.0–10.5)

## 2015-11-22 LAB — HEPATIC FUNCTION PANEL
ALT: 14 U/L (ref 9–46)
AST: 18 U/L (ref 10–35)
Albumin: 4.1 g/dL (ref 3.6–5.1)
Alkaline Phosphatase: 50 U/L (ref 40–115)
BILIRUBIN INDIRECT: 0.5 mg/dL (ref 0.2–1.2)
Bilirubin, Direct: 0.1 mg/dL (ref <=0.2)
TOTAL PROTEIN: 6.2 g/dL (ref 6.1–8.1)
Total Bilirubin: 0.6 mg/dL (ref 0.2–1.2)

## 2015-11-22 LAB — BASIC METABOLIC PANEL WITH GFR
BUN: 9 mg/dL (ref 7–25)
CALCIUM: 9.4 mg/dL (ref 8.6–10.3)
CHLORIDE: 105 mmol/L (ref 98–110)
CO2: 28 mmol/L (ref 20–31)
CREATININE: 0.86 mg/dL (ref 0.70–1.11)
GFR, EST NON AFRICAN AMERICAN: 79 mL/min (ref >=60)
GFR, Est African American: >89 mL/min (ref >=60)
GLUCOSE: 81 mg/dL (ref 65–99)
Potassium: 4.2 mmol/L (ref 3.5–5.3)
Sodium: 141 mmol/L (ref 135–146)

## 2015-11-22 LAB — LIPID PANEL
CHOL/HDL RATIO: 2.9 ratio (ref <=5.0)
Cholesterol: 152 mg/dL (ref 125–200)
HDL: 53 mg/dL (ref >=40)
LDL CALC: 76 mg/dL (ref <130)
Triglycerides: 114 mg/dL (ref <150)
VLDL: 23 mg/dL (ref <30)

## 2015-11-22 LAB — TSH: TSH: 1.7 m[IU]/L (ref 0.40–4.50)

## 2015-11-22 NOTE — Progress Notes (Signed)
Patient ID: [name] Yu, male   DOB: 1927/05/18, 82 y.o.   MRN: [id number]  MEDICARE ANNUAL WELLNESS VISIT AND FOLLOW UP Assessment:    1. Encounter for Medicare annual wellness exam   2. Essential hypertension -cont monitor -well controlled - TSH  3. Atrial fibrillation, unspecified type ([place]) -[facility]   4. ASHD (arteriosclerotic heart disease) -at goal cholesterol -[facility]  5. OSA (obstructive sleep apnea) -using CPAP  6. Diverticulosis of large intestine without hemorrhage -no recent flares  7. Rectus sheath hematoma, subsequent encounter -resolved  8. Pre-diabetes  - Hemoglobin A1c  9. Mixed hyperlipidemia -cont simvistatin - Lipid panel  10. Personal history of colonic polyps -monitored by colonoscopy  11. Depression, controlled -ativan prn  12. Medication management  - CBC with Differential/Platelet - BASIC METABOLIC PANEL WITH GFR - Hepatic function panel  13. Vitamin D deficiency -cont supplement  14. BMI 28.0-28.9,adult     Over 30 minutes of exam, counseling, chart review, and critical decision making was performed  Plan:   During the course of the visit the patient was educated and counseled about appropriate screening and preventive services including:    Pneumococcal vaccine   Influenza vaccine  Prevnar 13  Td vaccine  Screening electrocardiogram  Colorectal cancer screening  Diabetes screening  Glaucoma screening  Nutrition counseling   Conditions/risks identified: BMI: Discussed weight loss, diet, and increase physical activity.  Increase physical activity: AHA recommends 150 minutes of physical activity a week.  Medications reviewed Diabetes is at goal, ACE/ARB therapy: Yes. Urinary Incontinence is not an issue: discussed non pharmacology and pharmacology options.  Fall risk: low- discussed PT, home fall assessment, medications.    Subjective:  [name] Yu is a 82 y.o. male who presents for  Medicare Annual Wellness Visit and 3 month follow up for HTN, hyperlipidemia, prediabetes, and vitamin D Def.  Date of last medicare wellness visit was is unknown.  His blood pressure has been controlled at home, today their BP is BP: 138/80 mmHg He does workout. He denies chest pain, shortness of breath, dizziness.    He is on cholesterol medication and denies myalgias. His cholesterol is at goal. The cholesterol last visit was:   Lab Results  Component Value Date   CHOL 163 08/22/2015   HDL 53 08/22/2015   LDLCALC 83 08/22/2015   TRIG 133 08/22/2015   CHOLHDL 3.1 08/22/2015   He has been working on diet and exercise for prediabetes, and denies foot ulcerations, hyperglycemia, hypoglycemia , increased appetite, nausea, paresthesia of the feet, polydipsia, polyuria, visual disturbances, vomiting and weight loss. Last A1C in the office was:  Lab Results  Component Value Date   HGBA1C 5.8* 08/22/2015   Last GFR NonAA   Lab Results  Component Value Date   [facility] 81 08/22/2015   AA  Lab Results  Component Value Date   GFRAA >89 08/22/2015   Patient is on Vitamin D supplement.   Lab Results  Component Value Date   VD25OH 59 08/22/2015      Medication Review: Current Outpatient Prescriptions on File Prior to Visit  Medication Sig Dispense Refill  . aspirin EC 81 MG tablet Take 1 tablet (81 mg total) by mouth daily. 90 tablet 3  . Calcium Carbonate Antacid (TUMS E-X PO) Take 2 tablets by mouth daily.    . Cholecalciferol 5000 UNITS TABS Take 1 tablet by mouth daily.    . citalopram (CELEXA) 40 MG tablet TAKE ONE TABLET BY MOUTH  ONCE DAILY. 90 tablet 1  . diltiazem (CARDIZEM) 60 MG tablet Take 1 tablet (60 mg total) by mouth 4 (four) times daily as needed. 120 tablet 11  . finasteride (PROSCAR) 5 MG tablet TAKE 1 TABLET BY MOUTH ONCE DAILY 90 tablet 99  . LORazepam (ATIVAN) 2 MG tablet TAKE 1/2 TO 1 TABLET BY MOUTH THREE(3) TIMES DAILY AS DIRECTED 90 tablet 2  . Magnesium  200 MG TABS Take 1 tablet by mouth daily.    . Multiple Vitamin (MULTIVITAMIN) capsule Take 1 capsule by mouth daily.    Marland Kitchen neomycin-polymyxin b-dexamethasone (MAXITROL) 3.5-10000-0.1 SUSP Place 1 drop into both eyes 4 (four) times daily. 5 mL 1  . Omega-3 Fatty Acids (FISH OIL PO) Take 1 tablet by mouth daily.    . simvastatin (ZOCOR) 40 MG tablet TAKE ONE TABLET BY MOUTH ONE TIME DAILY FOR CHLOSTEROL 30 tablet PRN  . vitamin C (ASCORBIC ACID) 500 MG tablet Take 1,000 mg by mouth daily.     No current facility-administered medications on file prior to visit.    Current Problems (verified) Patient Active Problem List   Diagnosis Date Noted  . BMI 28.0-28.9,adult 08/21/2015  . Encounter for Medicare annual wellness exam 04/27/2015  . Vitamin D deficiency 03/04/2014  . Medication management 02/17/2014  . ASHD (arteriosclerotic heart disease)   . Depression, controlled   . Diverticulosis   . Rectus sheath hematoma 02/11/2014  . Personal history of colonic polyps 12/29/2012  . Hypertension   . Pre-diabetes   . Mixed hyperlipidemia   . OSA (obstructive sleep apnea)   . Atrial fibrillation ([facility])     Screening Tests Immunization History  Administered Date(s) Administered  . DT 07/06/2014  . Influenza Split 07/20/2014  . Influenza, High Dose Seasonal PF 08/22/2015  . Pneumococcal Conjugate-13 07/06/2014  . Pneumococcal Polysaccharide-23 06/09/2013  . Zoster 10/15/2005    Preventative care: Last colonoscopy: 2003  Prior vaccinations: TD or Tdap: 2015  Influenza: 2016  Pneumococcal: 2014 Prevnar13: 2015 Shingles/Zostavax: 2007  Names of Other Physician/Practitioners you currently use: 1. [facility] 3. Dr Olena Heckle, dentist, last visit 2016 Patient Care Team: [name], MD as PCP - General (Internal Medicine) Benito Mccreedy, MD as Referring Physician (Family  Medicine) Minna Merritts, MD as Consulting Physician (Cardiology) Inda Castle, MD as Consulting Physician (Gastroenterology)  Past Surgical History  Procedure Laterality Date  . Hernia repair     Family History  Problem Relation Age of Onset  . Diabetes Brother   . Alzheimer's disease Mother   . Breast cancer Sister   . Rectal cancer Sister   . Breast cancer Daughter   . Ulcerative colitis Sister   . Heart disease Brother   . Heart disease Sister   . Kidney cancer Father    Social History  Substance Use Topics  . Smoking status: Former Smoker    Quit date: 09/14/1993  . Smokeless tobacco: Never Used  . Alcohol Use: No    MEDICARE WELLNESS OBJECTIVES: Tobacco use: He does not smoke.  Patient is a former smoker. If yes, counseling given Alcohol Current alcohol use: social drinker Osteoporosis: hypogonadism, History of fracture in the past year: no Fall risk: Low Risk Hearing: normal Visual acuity: impaired,  does perform annual eye exam Diet: in general, a "healthy" diet   Physical activity: Current Exercise Habits:: The patient does not participate in regular exercise at present Cardiac  risk factors: Cardiac Risk Factors include: advanced age (>37men, >48 women);diabetes mellitus;dyslipidemia;family history of premature cardiovascular disease;hypertension;male gender Depression/mood screen:   Depression screen [facility] 2/9 11/22/2015  Decreased Interest 0  Down, Depressed, Hopeless 0  PHQ - 2 Score 0    ADLs:  In your present state of health, do you have any difficulty performing the following activities: 11/22/2015 08/22/2015  Hearing? N N  Vision? Y -  Difficulty concentrating or making decisions? N -  Walking or climbing stairs? N -  Dressing or bathing? N -  Doing errands, shopping? Y -  Conservation officer, nature and eating ? N -  Using the Toilet? N -  In the past six months, have you accidently leaked urine? N -  Do you have problems with loss of bowel control? N -   Managing your Medications? N -  Managing your Finances? N -  Housekeeping or managing your Housekeeping? N -     Cognitive Testing  Alert? Yes  Normal Appearance?Yes  Oriented to person? Yes  Place? Yes   Time? Yes  Recall of three objects?  Yes  Can perform simple calculations? Yes  Displays appropriate judgment?Yes  Can read the correct time from a watch face?Yes  EOL planning: Does patient have an advance directive?: Yes Type of Advance Directive: Healthcare Power of Attorney, Living will Copy of advanced directive(s) in chart?: No - copy requested   Objective:   Today's Vitals   11/22/15 0947  BP: 138/80  Pulse: 70  Temp: 98.2 F (36.8 C)  TempSrc: Temporal  Resp: 16  Height: 5' 9.5" (1.765 m)  Weight: 194 lb (87.998 kg)   Body mass index is 28.25 kg/(m^2).  General appearance: alert, no distress, WD/WN, male HEENT: normocephalic, sclerae anicteric, TMs pearly, nares patent, no discharge or erythema, pharynx normal Oral cavity: MMM, no lesions Neck: supple, no lymphadenopathy, no thyromegaly, no masses Heart: RRR, normal S1, S2, no murmurs Lungs: CTA bilaterally, no wheezes, rhonchi, or rales Abdomen: +bs, soft, non tender, non distended, no masses, no hepatomegaly, no splenomegaly Musculoskeletal: nontender, no swelling, no obvious deformity Extremities: no edema, no cyanosis, no clubbing Pulses: 2+ symmetric, upper and lower extremities, normal cap refill Neurological: alert, oriented x 3, CN2-12 intact, strength normal upper extremities and lower extremities, sensation normal throughout, DTRs 2+ throughout, no cerebellar signs, gait normal Psychiatric: normal affect, behavior normal, pleasant   Medicare Attestation I have personally reviewed: The patient's medical and social history Their use of alcohol, tobacco or illicit drugs Their current medications and supplements The patient's functional ability including ADLs,fall risks, home safety risks,  cognitive, and hearing and visual impairment Diet and physical activities Evidence for depression or mood disorders  The patient's weight, height, BMI, and visual acuity have been recorded in the chart.  I have made referrals, counseling, and provided education to the patient based on review of the above and I have provided the patient with a written personalized care plan for preventive services.     Starlyn Skeans, PA-C   11/22/2015

## 2015-11-23 LAB — HEMOGLOBIN A1C
Hgb A1c MFr Bld: 5.7 % — ABNORMAL HIGH (ref <5.7)
MEAN PLASMA GLUCOSE: 117 mg/dL — AB (ref <117)

## 2015-12-13 ENCOUNTER — Ambulatory Visit (HOSPITAL_COMMUNITY)
Admission: RE | Admit: 2015-12-13 | Discharge: 2015-12-13 | Disposition: A | Discharge status: Home / Self Care | Payer: Medicare Other | Source: Ambulatory Visit | Attending: Physician Assistant | Admitting: Physician Assistant

## 2015-12-13 ENCOUNTER — Ambulatory Visit (INDEPENDENT_AMBULATORY_CARE_PROVIDER_SITE_OTHER): Payer: Medicare Other | Admitting: Physician Assistant

## 2015-12-13 ENCOUNTER — Other Ambulatory Visit: Payer: Self-pay

## 2015-12-13 ENCOUNTER — Encounter: Payer: Self-pay | Admitting: Physician Assistant

## 2015-12-13 VITALS — BP 126/70 | HR 71 | Temp 97.3°F | Resp 14 | Ht 69.2 in | Wt 191.4 lb

## 2015-12-13 DIAGNOSIS — J9811 Atelectasis: Secondary | ICD-10-CM | POA: Insufficient documentation

## 2015-12-13 DIAGNOSIS — R059 Cough, unspecified: Secondary | ICD-10-CM

## 2015-12-13 DIAGNOSIS — R05 Cough: Secondary | ICD-10-CM

## 2015-12-13 DIAGNOSIS — J984 Other disorders of lung: Secondary | ICD-10-CM | POA: Insufficient documentation

## 2015-12-13 DIAGNOSIS — R509 Fever, unspecified: Secondary | ICD-10-CM | POA: Insufficient documentation

## 2015-12-13 MED ORDER — MECLIZINE HCL 25 MG PO TABS: ORAL_TABLET | ORAL | Status: DC

## 2015-12-13 MED ORDER — IPRATROPIUM-ALBUTEROL 0.5-2.5 (3) MG/3ML IN SOLN
3.0000 mL | Freq: Once | RESPIRATORY_TRACT | Status: AC
  Administered 2015-12-13: 3 mL via RESPIRATORY_TRACT

## 2015-12-13 MED ORDER — AZITHROMYCIN 250 MG PO TABS
ORAL_TABLET | ORAL | Status: AC
Start: 2015-12-13 — End: 2015-12-18

## 2015-12-13 MED ORDER — PROMETHAZINE-CODEINE 6.25-10 MG/5ML PO SYRP: 5.0000 mL | ORAL_SOLUTION | Freq: Four times a day (QID) | ORAL | Status: DC | PRN

## 2015-12-13 NOTE — Progress Notes (Signed)
Subjective:    Patient ID: [name] Yu, male    DOB: 03-Nov-1926, 82 y.o.   MRN: [id number]  HPI 82 y.o. 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 Has sinus congestion, vertigo, sneezing that progressed into productive cough with yellow mucus, sinus congestion. Has been on allegra, OTC cough med. Has had chills without fever. Denies SOB, CP, changes in vision, focal weakness.   Blood pressure 126/70, pulse 71, temperature 97.3 F (36.3 C), temperature source Temporal, resp. rate 14, height 5' 9.2" (1.758 m), weight 191 lb 6.4 oz (86.818 kg), SpO2 94 %.  Current Outpatient Prescriptions on File Prior to Visit  Medication Sig Dispense Refill  . aspirin EC 81 MG tablet Take 1 tablet (81 mg total) by mouth daily. 90 tablet 3  . Calcium Carbonate Antacid (TUMS E-X PO) Take 2 tablets by mouth daily.    . Cholecalciferol 5000 UNITS TABS Take 1 tablet by mouth daily.    . citalopram (CELEXA) 40 MG tablet TAKE ONE TABLET BY MOUTH ONCE DAILY. 90 tablet 1  . diltiazem (CARDIZEM) 60 MG tablet Take 1 tablet (60 mg total) by mouth 4 (four) times daily as needed. 120 tablet 11  . finasteride (PROSCAR) 5 MG tablet TAKE 1 TABLET BY MOUTH ONCE DAILY 90 tablet 99  . LORazepam (ATIVAN) 2 MG tablet TAKE 1/2 TO 1 TABLET BY MOUTH THREE(3) TIMES DAILY AS DIRECTED 90 tablet 2  . Magnesium 200 MG TABS Take 1 tablet by mouth daily.    . Multiple Vitamin (MULTIVITAMIN) capsule Take 1 capsule by mouth daily.    Marland Kitchen neomycin-polymyxin b-dexamethasone (MAXITROL) 3.5-10000-0.1 SUSP Place 1 drop into both eyes 4 (four) times daily. 5 mL 1  . Omega-3 Fatty Acids (FISH OIL PO) Take 1 tablet by mouth daily.    . simvastatin (ZOCOR) 40 MG tablet TAKE ONE TABLET BY MOUTH ONE TIME DAILY FOR CHLOSTEROL 30 tablet PRN  . vitamin C (ASCORBIC ACID) 500 MG tablet Take 1,000 mg by mouth daily.     No current facility-administered medications on file prior to visit.   Past  Medical History  Diagnosis Date  . Hypertension   . Pre-diabetes   . Dyslipidemia   . OSA (obstructive sleep apnea)   . Atrial fib/flutter, transient     off coumadin since 11/2013- CHADSVAC of 2  . Colon polyps     2003 hyperplastic and tubuler adenoma  . Diverticulosis   . Rectus sheath hematoma 11/21/2013    Dr. Deatra Ina   . ASHD (arteriosclerotic heart disease)   . Depression    Review of Systems  Constitutional: Positive for chills and fatigue. Negative for fever and diaphoresis.  HENT: Positive for congestion, ear pain (left), postnasal drip, rhinorrhea, sinus pressure and sore throat. Negative for sneezing, trouble swallowing and voice change.   Eyes: Negative.   Respiratory: Positive for cough. Negative for chest tightness, shortness of breath and wheezing.   Cardiovascular: Negative.  Negative for chest pain, palpitations and leg swelling.  Gastrointestinal: Negative.   Genitourinary: Negative.   Musculoskeletal: Negative.  Negative for neck pain.  Neurological: Positive for dizziness. Negative for tremors, syncope, facial asymmetry, speech difficulty, weakness, light-headedness, numbness and headaches.       Objective:   Physical Exam  Constitutional: He is oriented to person, place, and time. He appears well-developed and well-nourished.  HENT:  Head: Normocephalic and atraumatic.  Right Ear: Hearing and external ear normal.  A middle ear effusion is present.  Left Ear: Hearing and external ear normal. A middle ear effusion is present.  Nose: Right sinus exhibits maxillary sinus tenderness. Left sinus exhibits maxillary sinus tenderness.  Mouth/Throat: Uvula is midline, oropharynx is clear and moist and mucous membranes are normal. No oropharyngeal exudate, posterior oropharyngeal edema or tonsillar abscesses.  Eyes: Conjunctivae are normal. Pupils are equal, round, and reactive to light.  Neck: Normal range of motion. Neck supple.  Cardiovascular: Normal rate, regular  rhythm and normal heart sounds.   No murmur heard. Pulmonary/Chest: Effort normal. No respiratory distress. He has no decreased breath sounds. He has no wheezes. He has rhonchi in the right lower field and the left lower field. He has no rales. He exhibits no tenderness.  Abdominal: Soft. Bowel sounds are normal.  Musculoskeletal: Normal range of motion.  Lymphadenopathy:    He has no cervical adenopathy.  Neurological: He is alert and oriented to person, place, and time.  Skin: Skin is warm and dry. No rash noted.      Assessment & Plan:  Cough/sinus infection with history of bibasilar scarring last CXR 2014 Will get CXR, may need low dose CT scan  Zpak, breathing treatment, OTC treatment.   Vertigo Meclizine as needed, if worsening dizziness, changes in vision/speech, focal weakness go to ER.

## 2015-12-13 NOTE — Patient Instructions (Signed)

## 2015-12-28 ENCOUNTER — Encounter: Payer: Self-pay | Admitting: Cardiovascular Disease

## 2015-12-28 ENCOUNTER — Ambulatory Visit (INDEPENDENT_AMBULATORY_CARE_PROVIDER_SITE_OTHER): Payer: Medicare Other | Admitting: Cardiovascular Disease

## 2015-12-28 VITALS — BP 120/70 | HR 53 | Ht 70.0 in | Wt 192.2 lb

## 2015-12-28 DIAGNOSIS — I4891 Unspecified atrial fibrillation: Secondary | ICD-10-CM

## 2015-12-28 DIAGNOSIS — I10 Essential (primary) hypertension: Secondary | ICD-10-CM

## 2015-12-28 DIAGNOSIS — E782 Mixed hyperlipidemia: Secondary | ICD-10-CM | POA: Diagnosis not present

## 2015-12-28 DIAGNOSIS — I2510 Atherosclerotic heart disease of native coronary artery without angina pectoris: Secondary | ICD-10-CM

## 2015-12-28 NOTE — Progress Notes (Signed)
Patient ID: [name] Yu, male    DOB: 1927-04-04, 82 y.o.   MRN: [id number]  HPI Comments: [name] is a 82 yo gentleman with no known coronary artery disease, previously followed at [place], history of HTN, elevated lipids and sleep apnea, bradycardia on calcium channel blockers and beta blockers, presents for routine followup of his hypertension and atrial fibrillation. Previously followed at the [facility].  prior pipe smoking for 20 years.  In follow-up today, he reports that he is doing well. He has a birthday next week Denies any significant palpitations or tachycardia concerning for atrial fibrillation No recent upper respiratory infections  Prior history of smoking for 30 years from age 82 to age  50 Previous CT scan reviewed with him from 2008 showing coronary calcifications  He denies any chest pain concerning for angina even on exertion  EKG from today's visit shows sinus bradycardia, rate 53 bpm, left axis deviation  Other past medical history  normal cath in 2003.  Normal stress test in 2005 at [facility].  07/2006 had Afib w/u at [facility].  [place] and on cardizem Dose decreased due to bradycardia Another normal stress test in 2007.     Allergies  Allergen Reactions  . Ciprofloxacin     dysphoria  . Novocain [Procaine Hcl]   . Prednisone     Nervousness    Outpatient Encounter Prescriptions as of 12/28/2015  Medication Sig  . aspirin EC 81 MG tablet Take 1 tablet (81 mg total) by mouth daily.  . Calcium Carbonate Antacid (TUMS E-X PO) Take 2 tablets by mouth daily.  . Cholecalciferol 5000 UNITS TABS Take 1 tablet by mouth daily.  . citalopram (CELEXA) 40 MG tablet TAKE ONE TABLET BY MOUTH ONCE DAILY.  Marland Kitchen diltiazem (CARDIZEM) 60 MG tablet Take 1 tablet (60 mg total) by mouth 4 (four) times daily as needed.  . finasteride (PROSCAR) 5 MG tablet TAKE 1 TABLET BY MOUTH ONCE DAILY  . LORazepam (ATIVAN) 2 MG tablet TAKE 1/2 TO 1 TABLET BY MOUTH THREE(3) TIMES DAILY AS  DIRECTED  . Magnesium 200 MG TABS Take 1 tablet by mouth daily.  . meclizine (ANTIVERT) 25 MG tablet 1/2-1 pill up to 3 times daily for motion sickness/dizziness  . Multiple Vitamin (MULTIVITAMIN) capsule Take 1 capsule by mouth daily.  Marland Kitchen neomycin-polymyxin b-dexamethasone (MAXITROL) 3.5-10000-0.1 SUSP Place 1 drop into both eyes 4 (four) times daily.  . Omega-3 Fatty Acids (FISH OIL PO) Take 1 tablet by mouth daily.  . promethazine-codeine (PHENERGAN WITH CODEINE) 6.25-10 MG/5ML syrup Take 5 mLs by mouth every 6 (six) hours as needed for cough. Max: 3mL per day  . simvastatin (ZOCOR) 40 MG tablet TAKE ONE TABLET BY MOUTH ONE TIME DAILY FOR CHLOSTEROL  . sulfamethoxazole-trimethoprim (BACTRIM DS,SEPTRA DS) 800-160 MG tablet TK 1 T PO BID  . vitamin C (ASCORBIC ACID) 500 MG tablet Take 1,000 mg by mouth daily.   No facility-administered encounter medications on file as of 12/28/2015.    Past Medical History  Diagnosis Date  . Hypertension   . Pre-diabetes   . Dyslipidemia   . OSA (obstructive sleep apnea)   . Atrial fib/flutter, transient     off coumadin since 11/2013- CHADSVAC of 2  . Colon polyps     2003 hyperplastic and tubuler adenoma  . Diverticulosis   . Rectus sheath hematoma 11/21/2013    Dr. Deatra Ina   . ASHD (arteriosclerotic heart disease)   . Depression     Past  Surgical History  Procedure Laterality Date  . Hernia repair      Social History  reports that he quit smoking about 22 years ago. He has never used smokeless tobacco. He reports that he does not drink alcohol or use illicit drugs.  Family History family history includes Alzheimer's disease in his mother; Breast cancer in his daughter and sister; Diabetes in his brother; Heart disease in his brother and sister; Kidney cancer in his father; Rectal cancer in his sister; Ulcerative colitis in his sister.  Review of Systems  Constitutional: Negative.   Respiratory: Negative.   Cardiovascular: Negative.    Gastrointestinal: Negative.   Musculoskeletal: Negative.   Skin: Negative.   Neurological: Negative.   Hematological: Negative.   Psychiatric/Behavioral: Negative.   All other systems reviewed and are negative.   BP 120/70 mmHg  Pulse 53  Ht 5\' 10"  (1.778 m)  Wt 192 lb 4 oz (87.204 kg)  BMI 27.58 kg/m2  Physical Exam  Constitutional: He is oriented to person, place, and time. He appears well-developed and well-nourished.  HENT:  Head: Normocephalic.  Nose: Nose normal.  Mouth/Throat: Oropharynx is clear and moist.  Eyes: Conjunctivae are normal. Pupils are equal, round, and reactive to light.  Neck: Normal range of motion. Neck supple. No JVD present.  Cardiovascular: Normal rate, regular rhythm, S1 normal, S2 normal, normal heart sounds and intact distal pulses.  Exam reveals no gallop and no friction rub.   No murmur heard. Pulmonary/Chest: Effort normal and breath sounds normal. No respiratory distress. He has no wheezes. He has no rales. He exhibits no tenderness.  Abdominal: Soft. Bowel sounds are normal. He exhibits no distension. There is no tenderness.  Musculoskeletal: Normal range of motion. He exhibits no edema or tenderness.  Lymphadenopathy:    He has no cervical adenopathy.  Neurological: He is alert and oriented to person, place, and time. Coordination normal.  Skin: Skin is warm and dry. No rash noted. No erythema.  Psychiatric: He has a normal mood and affect. His behavior is normal. Judgment and thought content normal.      Assessment and Plan   Nursing note and vitals reviewed.

## 2015-12-28 NOTE — Patient Instructions (Signed)
You are doing well. No medication changes were made.  Please call us if you have new issues that need to be addressed before your next appt.  Your physician wants you to follow-up in: 12 months.  You will receive a reminder letter in the mail two months in advance. If you don't receive a letter, please call our office to schedule the follow-up appointment. 

## 2015-12-28 NOTE — Assessment & Plan Note (Signed)
Coronary calcifications noted on CT scan from 2008 Cholesterol close to goal, nonsmoker currently, no significant diabetes No further testing needed

## 2015-12-28 NOTE — Assessment & Plan Note (Signed)
Blood pressure is well controlled on today's visit. No changes made to the medications. 

## 2015-12-28 NOTE — Assessment & Plan Note (Signed)
Cholesterol is at goal on the current lipid regimen. No changes to the medications were made.  

## 2015-12-28 NOTE — Assessment & Plan Note (Signed)
No significant symptoms concerning for paroxysmal atrial fibrillation. No changes to his medications

## 2016-01-30 ENCOUNTER — Other Ambulatory Visit: Payer: Self-pay | Admitting: Internal Medicine

## 2016-02-11 DIAGNOSIS — K120 Recurrent oral aphthae: Secondary | ICD-10-CM | POA: Diagnosis not present

## 2016-02-19 NOTE — Patient Instructions (Signed)
Recommend Adult Low Dose Aspirin or   coated  Aspirin 81 mg daily   To reduce risk of Colon Cancer 20 %,   Skin Cancer 26 % ,   Melanoma 46%   and   Pancreatic cancer 60%   ++++++++++++++++++++++++++++++++++++++++++++++++++++++ Vitamin D goal   is between 70-100.   Please make sure that you are taking your Vitamin D as directed.   It is very important as a natural anti-inflammatory   helping hair, skin, and nails, as well as reducing stroke and heart attack risk.   It helps your bones and helps with mood.  It also decreases numerous cancer risks so please take it as directed.   Low Vit D is associated with a 200-300% higher risk for CANCER   and 200-300% higher risk for HEART   ATTACK  &  STROKE.   ......................................  It is also associated with higher death rate at younger ages,   autoimmune diseases like Rheumatoid arthritis, Lupus, Multiple Sclerosis.     Also many other serious conditions, like depression, Alzheimer's  Dementia, infertility, muscle aches, fatigue, fibromyalgia - just to name a few.  ++++++++++++++++++++++++++++++++++++++++++++++++  Recommend the book "The END of DIETING" by Dr Joel Fuhrman   & the book "The END of DIABETES " by Dr Joel Fuhrman  At [employer] - get book & Audio CD's     Being diabetic has a  300% increased risk for heart attack, stroke, cancer, and alzheimer- type vascular dementia. It is very important that you work harder with diet by avoiding all foods that are white. Avoid white rice (brown & wild rice is OK), white potatoes (sweetpotatoes in moderation is OK), White bread or wheat bread or anything made out of white flour like bagels, donuts, rolls, buns, biscuits, cakes, pastries, cookies, pizza crust, and pasta (made from white flour & egg whites) - vegetarian pasta or spinach or wheat pasta is OK. Multigrain breads like Arnold's or Pepperidge Farm, or multigrain sandwich thins or flatbreads.  Diet,  exercise and weight loss can reverse and cure diabetes in the early stages.  Diet, exercise and weight loss is very important in the control and prevention of complications of diabetes which affects every system in your body, ie. Brain - dementia/stroke, eyes - glaucoma/blindness, heart - heart attack/heart failure, kidneys - dialysis, stomach - gastric paralysis, intestines - malabsorption, nerves - severe painful neuritis, circulation - gangrene & loss of a leg(s), and finally cancer and Alzheimers.    I recommend avoid fried & greasy foods,  sweets/candy, white rice (brown or wild rice or Quinoa is OK), white potatoes (sweet potatoes are OK) - anything made from white flour - bagels, doughnuts, rolls, buns, biscuits,white and wheat breads, pizza crust and traditional pasta made of white flour & egg white(vegetarian pasta or spinach or wheat pasta is OK).  Multi-grain bread is OK - like multi-grain flat bread or sandwich thins. Avoid alcohol in excess. Exercise is also important.    Eat all the vegetables you want - avoid meat, especially red meat and dairy - especially cheese.  Cheese is the most concentrated form of trans-fats which is the worst thing to clog up our arteries. Veggie cheese is OK which can be found in the fresh produce section at [employer] or [employer] or [employer]  ++++++++++++++++++++++++++++++++++++++++++++++++++ DASH Eating Plan  DASH stands for "Dietary Approaches to Stop Hypertension."   The DASH eating plan is a healthy eating plan that has been shown to reduce high blood   pressure (hypertension). Additional health benefits may include reducing the risk of type 2 diabetes mellitus, heart disease, and stroke. The DASH eating plan may also help with weight loss.  WHAT DO I NEED TO KNOW ABOUT THE DASH EATING PLAN?  For the DASH eating plan, you will follow these general guidelines:  Choose foods with a percent daily value for sodium of less than 5% (as listed on the food  label).  Use salt-free seasonings or herbs instead of table salt or sea salt.  Check with your health care provider or pharmacist before using salt substitutes.  Eat lower-sodium products, often labeled as "lower sodium" or "no salt added."  Eat fresh foods.  Eat more vegetables, fruits, and low-fat dairy products.    Choose whole grains. Look for the word "whole" as the first word in the ingredient list.  Choose fish   Limit sweets, desserts, sugars, and sugary drinks.  Choose heart-healthy fats.  Eat veggie cheese   Eat more home-cooked food and less restaurant, buffet, and fast food.  Limit fried foods.  Cook foods using methods other than frying.  Limit canned vegetables. If you do use them, rinse them well to decrease the sodium.  When eating at a restaurant, ask that your food be prepared with less salt, or no salt if possible.                      WHAT FOODS CAN I EAT?  Read Dr Joel Fuhrman's books on The End of Dieting & The End of Diabetes  Grains  Whole grain or whole wheat bread. Brown rice. Whole grain or whole wheat pasta. Quinoa, bulgur, and whole grain cereals. Low-sodium cereals. Corn or whole wheat flour tortillas. Whole grain cornbread. Whole grain crackers. Low-sodium crackers.  Vegetables  Fresh or frozen vegetables (raw, steamed, roasted, or grilled). Low-sodium or reduced-sodium tomato and vegetable juices. Low-sodium or reduced-sodium tomato sauce and paste. Low-sodium or reduced-sodium canned vegetables.   Fruits  All fresh, canned (in natural juice), or frozen fruits.  Protein Products   All fish and seafood.  Dried beans, peas, or lentils. Unsalted nuts and seeds. Unsalted canned beans.  Dairy  Low-fat dairy products, such as skim or 1% milk, 2% or reduced-fat cheeses, low-fat ricotta or cottage cheese, or plain low-fat yogurt. Low-sodium or reduced-sodium cheeses.  Fats and Oils  Tub margarines without trans fats. Light or  reduced-fat mayonnaise and salad dressings (reduced sodium). Avocado. Safflower, olive, or canola oils. Natural peanut or almond butter.  Other  Unsalted popcorn and pretzels. The items listed above may not be a complete list of recommended foods or beverages. Contact your dietitian for more options.  +++++++++++++++++++++++++++++++++++++++++++  WHAT FOODS ARE NOT RECOMMENDED?  Grains/ White flour or wheat flour  White bread. White pasta. White rice. Refined cornbread. Bagels and croissants. Crackers that contain trans fat.  Vegetables  Creamed or fried vegetables. Vegetables in a . Regular canned vegetables. Regular canned tomato sauce and paste. Regular tomato and vegetable juices.  Fruits  Dried fruits. Canned fruit in light or heavy syrup. Fruit juice.  Meat and Other Protein Products  Meat in general - RED mwaet & White meat.  Fatty cuts of meat. Ribs, chicken wings, bacon, sausage, bologna, salami, chitterlings, fatback, hot dogs, bratwurst, and packaged luncheon meats.  Dairy  Whole or 2% milk, cream, half-and-half, and cream cheese. Whole-fat or sweetened yogurt. Full-fat cheeses or blue cheese. Nondairy creamers and whipped toppings. Processed cheese, cheese spreads, or   cheese curds.  Condiments  Onion and garlic salt, seasoned salt, table salt, and sea salt. Canned and packaged gravies. Worcestershire sauce. Tartar sauce. Barbecue sauce. Teriyaki sauce. Soy sauce, including reduced sodium. Steak sauce. Fish sauce. Oyster sauce. Cocktail sauce. Horseradish. Ketchup and mustard. Meat flavorings and tenderizers. Bouillon cubes. Hot sauce. Tabasco sauce. Marinades. Taco seasonings. Relishes.  Fats and Oils Butter, stick margarine, lard, shortening and bacon fat. Coconut, palm kernel, or palm oils. Regular salad dressings.  Pickles and olives. Salted popcorn and pretzels.  The items listed above may not be a complete list of foods and beverages to  avoid. ++++++++++++++++++++++++++++++++++++++++++++++++++++++++++++++++++++++++ Atrial Fibrillation Atrial fibrillation is a type of irregular or rapid heartbeat (arrhythmia). In atrial fibrillation, the heart quivers continuously in a chaotic pattern. This occurs when parts of the heart receive disorganized signals that make the heart unable to pump blood normally. This can increase the risk for stroke, heart failure, and other heart-related conditions. There are different types of atrial fibrillation, including:  Paroxysmal atrial fibrillation. This type starts suddenly, and it usually stops on its own shortly after it starts.  Persistent atrial fibrillation. This type often lasts longer than a week. It may stop on its own or with treatment.  Long-lasting persistent atrial fibrillation. This type lasts longer than 12 months.                                                            Permanent atrial fibrillation. This type does not go away.  CAUSES This condition is caused by some heart-related conditions or procedures, including:  A heart attack.  Coronary artery disease.  Heart failure.  Heart valve conditions.  High blood pressure.  Inflammation of the sac that surrounds the heart (pericarditis).  Heart surgery.   Other causes include:  Pneumonia.  Obstructive sleep apnea.  Blockage of an artery in the lungs (pulmonary embolism, or PE).  Lung cancer.  Chronic lung disease.  Thyroid problems, especially if the thyroid is overactive (hyperthyroidism).  Caffeine.  Excessive alcohol use or illegal drug use.  Use of some medicines, including certain decongestants and diet pills.   Sometimes, the cause cannot be found. RISK FACTORS This condition is more likely to develop in:  People who are older in age.  People who smoke.  People who have diabetes mellitus.  People who are overweight (obese).  Athletes who exercise vigorously.   SYMPTOMS Symptoms  of this condition include:  A feeling that your heart is beating rapidly or irregularly.  A feeling of discomfort or pain in your chest.  Shortness of breath.  Sudden light-headedness or weakness.  Getting tired easily during exercise. In some cases, there are no symptoms.  DIAGNOSIS Your health care provider may be able to detect atrial fibrillation when taking your pulse. If detected, this condition may be diagnosed with:  An electrocardiogram (ECG).  A Holter monitor test that records your heartbeat patterns over a 24-hour period.  Transthoracic echocardiogram (TTE) to evaluate how blood flows through your heart.  Transesophageal echocardiogram (TEE) to view more detailed images of your heart.  A stress test.  Imaging tests, such as a CT scan or chest X-ray.    Blood tests. TREATMENT The main goals of treatment are to prevent blood clots from forming and to keep your heart  beating at a normal rate and rhythm. The type of treatment that you receive depends on many factors, such as your underlying medical conditions and how you feel when you are experiencing atrial fibrillation. This condition may be treated with:  Medicine to slow down the heart rate, bring the heart's rhythm back to normal, or prevent clots from forming.  Electrical cardioversion. This is a procedure that resets your heart's rhythm by delivering a controlled, low-energy shock to the heart through your skin.  Different types of ablation, such as catheter ablation, catheter ablation with pacemaker, or surgical ablation. These procedures destroy the heart tissues that send abnormal signals. When the pacemaker is used, it is placed under your skin to help your heart beat in a regular rhythm.   HOME CARE INSTRUCTIONS  Take over-the counter and prescription medicines only as told by your health care provider.  If your health care provider prescribed a blood-thinning medicine (anticoagulant), take it exactly  as told. Taking too much blood-thinning medicine can cause bleeding. If you do not take enough blood-thinning medicine, you will not have the protection that you need against stroke and other problems.  Do not use tobacco products, including cigarettes, chewing tobacco, and e-cigarettes. If you need help quitting, ask your health care provider.  If you have obstructive sleep apnea, manage your condition as told by your health care provider.  Do not drink alcohol.  Do not drink beverages that contain caffeine, such as coffee, soda, and tea.  Maintain a healthy weight. Do not use diet pills unless your health care provider approves. Diet pills may make heart problems worse.  Follow diet instructions as told by your health care provider.  Exercise regularly as told by your health care provider.  Keep all follow-up visits as told by your health care provider. This is important.   PREVENTION  Avoid drinking beverages that contain caffeine or alcohol.  Avoid certain medicines, especially medicines that are used for breathing problems.  Avoid certain herbs and herbal medicines, such as those that contain ephedra or ginseng.  Do not use illegal drugs, such as cocaine and amphetamines.  Do not smoke.  Manage your high blood pressure.   SEEK MEDICAL CARE IF:  You notice a change in the rate, rhythm, or strength of your heartbeat.  You are taking an anticoagulant and you notice increased bruising.  You tire more easily when you exercise or exert yourself.   SEEK IMMEDIATE MEDICAL CARE IF:  You have chest pain, abdominal pain, sweating, or weakness.  You feel nauseous.  You notice blood in your vomit, bowel movement, or urine.  You have shortness of breath.  You suddenly have swollen feet and ankles.  You feel dizzy.  You have sudden weakness or numbness of the face, arm, or leg, especially on one side of the body.  You have trouble speaking, trouble understanding, or  both (aphasia).  Your face or your eyelid droops on one side.

## 2016-02-19 NOTE — Progress Notes (Signed)
Patient ID: [name] Yu, male   DOB: 04-22-1927, 82 y.o.   MRN: [id number]  [facility]                       [name], M.D.        Uvaldo Bristle. [name], P.A.-C       Starlyn Skeans, P.A.-C   [facility] [place]                [address] [place] [id number] Telephone [phone] Telefax [phone] _________________________________________________________________________   This very nice 82 y.o. MWM presents for 6 month follow up with Hypertension, ASHD/ChAfib,  Hyperlipidemia, Pre-Diabetes and Vitamin D Deficiency. Patient was recently seen 4-5 days ago at a [facility] close to his home for an apparent Apthous ulcer of his  Lower inner lip and Rx'd Triancinalone cream.    Patient is treated for HTN circa 2005 & BP has been controlled at home. Today's BP: 104/70 mmHg. In 2007 , he was dx'd w/Afib and started on Coumadin which he continued until 01/2014 when he developed a large spontaneous Recus sheath hematoma and his cardiolgist, Dr Candis Musa at [facility] recommended stopping coumadin opting to low dose ASA instead.  In 2003, he had a negative Heart Cath and in 2005 & 2007 he had negative stress tests. Patient has had no complaints of any cardiac type chest pain, palpitations, dyspnea/orthopnea/PND, dizziness, claudication, or dependent edema.   Hyperlipidemia is controlled with diet & meds. Patient denies myalgias or other med SE's. Last Lipids were Cholesterol 152; HDL 53; LDL 76; Triglycerides 114 on 11/22/2015.    Also, the patient has history of PreDiabetes and has had no symptoms of reactive hypoglycemia, diabetic polys, paresthesias or visual blurring.  Last A1c was  5.7% on 11/22/2015.    Further, the patient also has history of Vitamin D Deficiency and supplements vitamin D without any suspected side-effects. Last vitamin D was  59 on 08/22/2015.  Medication Sig  . aspirin EC 81 MG   Take 1 tablet (81 mg total) by mouth daily.  . TUMS E-X Take 2 tablets by mouth daily.  . Cholecalciferol 5000 U  Take 1 tablet by mouth daily.  . citalopram  40 MG TAKE ONE TABLET BY MOUTH ONCE DAILY.  Marland Kitchen diltiazem  60 MG Take 1 tablet (60 mg total) by mouth 4 (four) times daily as needed.  . finasteride  5 MG  TAKE 1 TABLET BY MOUTH ONCE DAILY  . LORazepam  2 MG TAKE 1/2 TO 1 TABLET BY MOUTH THREE(3) TIMES DAILY AS DIRECTED  . Magnesium 200 MG  Take 1 tablet by mouth daily.  . meclizine  25 MG  1/2-1 pill up to 3 times daily for motion sickness/dizziness  . Multiple Vitamin  Take 1 capsule by mouth daily.  . Omega-3 FISH OIL Take 1 tablet by mouth daily.  . simvastatin  40 MG TAKE 1 TABLET BY MOUTH EVERY DAY FOR CHOLESTEROL  . vitamin C 500 MG  Take 1,000 mg by mouth daily.   Allergies  Allergen Reactions  . Ciprofloxacin     dysphoria  . Novocain [Procaine Hcl]   . Prednisone     Nervousness   PMHx:   Past Medical History  Diagnosis Date  . Hypertension   . Pre-diabetes   . Dyslipidemia   .  OSA (obstructive sleep apnea)   . Atrial fib/flutter, transient     off coumadin since 11/2013- CHADSVAC of 2  . Colon polyps     2003 hyperplastic and tubuler adenoma  . Diverticulosis   . Rectus sheath hematoma 11/21/2013    Dr. Deatra Ina   . ASHD (arteriosclerotic heart disease)   . Depression    Immunization History  Administered Date(s) Administered  . DT 07/06/2014  . Influenza Split 07/20/2014  . Influenza, High Dose Seasonal PF 08/22/2015  . Pneumococcal Conjugate-13 07/06/2014  . Pneumococcal Polysaccharide-23 06/09/2013  . Zoster 10/15/2005   Past Surgical History  Procedure Laterality Date  . Hernia repair     FHx:    Reviewed / unchanged  SHx:    Reviewed / unchanged  Systems Review:  Constitutional: Denies fever, chills, wt changes, headaches, insomnia, fatigue, night sweats, change in appetite. Eyes: Denies redness, blurred vision, diplopia, discharge, itchy,  watery eyes.  ENT: Denies discharge, congestion, post nasal drip, epistaxis, sore throat, earache, hearing loss, dental pain, tinnitus, vertigo, sinus pain, snoring.  CV: Denies chest pain, palpitations, irregular heartbeat, syncope, dyspnea, diaphoresis, orthopnea, PND, claudication or edema. Respiratory: denies cough, dyspnea, DOE, pleurisy, hoarseness, laryngitis, wheezing.  Gastrointestinal: Denies dysphagia, odynophagia, heartburn, reflux, water brash, abdominal pain or cramps, nausea, vomiting, bloating, diarrhea, constipation, hematemesis, melena, hematochezia  or hemorrhoids. Genitourinary: Denies dysuria, frequency, urgency, nocturia, hesitancy, discharge, hematuria or flank pain. Musculoskeletal: Denies arthralgias, myalgias, stiffness, jt. swelling, pain, limping or strain/sprain.  Skin: Denies pruritus, rash, hives, warts, acne, eczema or change in skin lesion(s). Neuro: No weakness, tremor, incoordination, spasms, paresthesia or pain. Psychiatric: Denies confusion, memory loss or sensory loss. Endo: Denies change in weight, skin or hair change.  Heme/Lymph: No excessive bleeding, bruising or enlarged lymph nodes.  Physical Exam  BP 104/70 mmHg  Pulse 72  Temp(Src) 97.5 F (36.4 C)  Resp 16  Ht 5\' 10"  (1.778 m)  Wt 192 lb 12.8 oz (87.454 kg)  BMI 27.66 kg/m2  Appears well nourished and in no distress. Eyes: PERRLA, EOMs, conjunctiva no swelling or erythema. Sinuses: No frontal/maxillary tenderness ENT/Mouth: EAC's clear, TM's nl w/o erythema, bulging. Nares clear w/o erythema, swelling, exudates. Oropharynx clear without erythema or exudates. Oral hygiene is good. Tongue normal, non obstructing. Hearing intact.  Neck: Supple. Thyroid nl. Car 2+/2+ without bruits, nodes or JVD. Chest: Respirations nl with BS clear & equal w/o rales, rhonchi, wheezing or stridor.  Cor: Heart sounds normal w/ sl irregular rate and rhythm without sig. murmurs, gallops, clicks, or rubs.  Peripheral pulses normal and equal  without edema.  Abdomen: Soft & bowel sounds normal. Non-tender w/o guarding, rebound, hernias, masses, or organomegaly.  Lymphatics: Unremarkable.  Musculoskeletal: Full ROM all peripheral extremities, joint stability, 5/5 strength, and normal gait.  Skin: Warm, dry without exposed rashes, lesions or ecchymosis apparent.  Neuro: Cranial nerves intact, reflexes equal bilaterally. Sensory-motor testing grossly intact. Tendon reflexes grossly intact.  Pysch: Alert & oriented x 3.  Insight and judgement nl & appropriate. No ideations.  Assessment and Plan:  1. Essential hypertension  - TSH  2. Mixed hyperlipidemia  - Lipid panel - TSH  3. Pre-diabetes  - Hemoglobin A1c - Insulin, random  4. Vitamin D deficiency  - VITAMIN D 25 Hydroxy   5. Coronary artery disease due to lipid rich plaque   6. Chronic atrial fibrillation (HCC)   7. Depression, controlled   8. Medication management  - CBC with Differential/Platelet - BASIC METABOLIC PANEL WITH GFR -  Hepatic function panel - Magnesium   Recommended regular exercise, BP monitoring, weight control, and discussed med and SE's. Recommended labs to assess and monitor clinical status. Further disposition pending results of labs. Over 30 minutes of exam, counseling, chart review was performed

## 2016-02-20 ENCOUNTER — Encounter: Payer: Self-pay | Admitting: Internal Medicine

## 2016-02-20 ENCOUNTER — Ambulatory Visit (INDEPENDENT_AMBULATORY_CARE_PROVIDER_SITE_OTHER): Payer: Medicare Other | Admitting: Internal Medicine

## 2016-02-20 VITALS — BP 104/70 | HR 72 | Temp 97.5°F | Resp 16 | Ht 70.0 in | Wt 192.8 lb

## 2016-02-20 DIAGNOSIS — I482 Chronic atrial fibrillation, unspecified: Secondary | ICD-10-CM

## 2016-02-20 DIAGNOSIS — E559 Vitamin D deficiency, unspecified: Secondary | ICD-10-CM | POA: Diagnosis not present

## 2016-02-20 DIAGNOSIS — E782 Mixed hyperlipidemia: Secondary | ICD-10-CM

## 2016-02-20 DIAGNOSIS — I2510 Atherosclerotic heart disease of native coronary artery without angina pectoris: Secondary | ICD-10-CM | POA: Diagnosis not present

## 2016-02-20 DIAGNOSIS — F32A Depression, unspecified: Secondary | ICD-10-CM

## 2016-02-20 DIAGNOSIS — I2583 Coronary atherosclerosis due to lipid rich plaque: Secondary | ICD-10-CM

## 2016-02-20 DIAGNOSIS — R7303 Prediabetes: Secondary | ICD-10-CM

## 2016-02-20 DIAGNOSIS — F329 Major depressive disorder, single episode, unspecified: Secondary | ICD-10-CM

## 2016-02-20 DIAGNOSIS — I10 Essential (primary) hypertension: Secondary | ICD-10-CM | POA: Diagnosis not present

## 2016-02-20 DIAGNOSIS — R7309 Other abnormal glucose: Secondary | ICD-10-CM | POA: Diagnosis not present

## 2016-02-20 DIAGNOSIS — Z79899 Other long term (current) drug therapy: Secondary | ICD-10-CM

## 2016-02-20 LAB — LIPID PANEL
CHOLESTEROL: 181 mg/dL (ref 125–200)
HDL: 60 mg/dL (ref >=40)
LDL Cholesterol: 85 mg/dL (ref <130)
TRIGLYCERIDES: 180 mg/dL — AB (ref <150)
Total CHOL/HDL Ratio: 3 Ratio (ref <=5.0)
VLDL: 36 mg/dL — AB (ref <30)

## 2016-02-20 LAB — CBC WITH DIFFERENTIAL/PLATELET
Basophils Absolute: 0 cells/uL (ref 0–200)
Basophils Relative: 0 %
EOS ABS: 162 {cells}/uL (ref 15–500)
Eosinophils Relative: 2 %
HCT: 47.8 % (ref 38.5–50.0)
HEMOGLOBIN: 16.5 g/dL (ref 13.2–17.1)
LYMPHS PCT: 22 %
Lymphs Abs: 1782 cells/uL (ref 850–3900)
MCH: 32.4 pg (ref 27.0–33.0)
MCHC: 34.5 g/dL (ref 32.0–36.0)
MCV: 93.9 fL (ref 80.0–100.0)
MONO ABS: 567 {cells}/uL (ref 200–950)
MONOS PCT: 7 %
MPV: 8.9 fL (ref 7.5–12.5)
NEUTROS PCT: 69 %
Neutro Abs: 5589 cells/uL (ref 1500–7800)
Platelets: 227 10*3/uL (ref 140–400)
RBC: 5.09 MIL/uL (ref 4.20–5.80)
RDW: 13.6 % (ref 11.0–15.0)
WBC: 8.1 10*3/uL (ref 3.8–10.8)

## 2016-02-20 LAB — HEPATIC FUNCTION PANEL
ALBUMIN: 4.1 g/dL (ref 3.6–5.1)
ALT: 11 U/L (ref 9–46)
AST: 15 U/L (ref 10–35)
Alkaline Phosphatase: 57 U/L (ref 40–115)
BILIRUBIN INDIRECT: 0.4 mg/dL (ref 0.2–1.2)
BILIRUBIN TOTAL: 0.5 mg/dL (ref 0.2–1.2)
Bilirubin, Direct: 0.1 mg/dL (ref <=0.2)
TOTAL PROTEIN: 6.4 g/dL (ref 6.1–8.1)

## 2016-02-20 LAB — BASIC METABOLIC PANEL WITH GFR
BUN: 16 mg/dL (ref 7–25)
CALCIUM: 9.4 mg/dL (ref 8.6–10.3)
CO2: 26 mmol/L (ref 20–31)
Chloride: 104 mmol/L (ref 98–110)
Creat: 0.92 mg/dL (ref 0.70–1.11)
GFR, EST AFRICAN AMERICAN: 86 mL/min (ref >=60)
GFR, EST NON AFRICAN AMERICAN: 75 mL/min (ref >=60)
Glucose, Bld: 118 mg/dL — ABNORMAL HIGH (ref 65–99)
Potassium: 4 mmol/L (ref 3.5–5.3)
SODIUM: 140 mmol/L (ref 135–146)

## 2016-02-20 LAB — HEMOGLOBIN A1C
Hgb A1c MFr Bld: 5.6 % (ref <5.7)
MEAN PLASMA GLUCOSE: 114 mg/dL

## 2016-02-20 LAB — MAGNESIUM: Magnesium: 2.1 mg/dL (ref 1.5–2.5)

## 2016-02-20 LAB — TSH: TSH: 2.07 mIU/L (ref 0.40–4.50)

## 2016-02-21 LAB — INSULIN, RANDOM: INSULIN: 90.2 u[IU]/mL — AB (ref 2.0–19.6)

## 2016-02-21 LAB — VITAMIN D 25 HYDROXY (VIT D DEFICIENCY, FRACTURES): Vit D, 25-Hydroxy: 58 ng/mL (ref 30–100)

## 2016-02-27 ENCOUNTER — Other Ambulatory Visit: Payer: Self-pay | Admitting: Cardiovascular Disease

## 2016-03-21 ENCOUNTER — Other Ambulatory Visit: Payer: Self-pay | Admitting: Internal Medicine

## 2016-03-21 DIAGNOSIS — F411 Generalized anxiety disorder: Secondary | ICD-10-CM

## 2016-04-03 ENCOUNTER — Encounter: Payer: Self-pay | Admitting: Internal Medicine

## 2016-04-03 ENCOUNTER — Ambulatory Visit (INDEPENDENT_AMBULATORY_CARE_PROVIDER_SITE_OTHER): Payer: Medicare Other | Admitting: Internal Medicine

## 2016-04-03 VITALS — BP 122/66 | HR 68 | Temp 97.3°F | Resp 16 | Ht 70.0 in | Wt 197.6 lb

## 2016-04-03 DIAGNOSIS — H8110 Benign paroxysmal vertigo, unspecified ear: Secondary | ICD-10-CM | POA: Diagnosis not present

## 2016-04-03 DIAGNOSIS — N492 Inflammatory disorders of scrotum: Secondary | ICD-10-CM | POA: Diagnosis not present

## 2016-04-03 MED ORDER — CEPHALEXIN 500 MG PO CAPS: ORAL_CAPSULE | ORAL | Status: AC

## 2016-04-03 MED ORDER — MECLIZINE HCL 25 MG PO TABS: ORAL_TABLET | ORAL | Status: AC

## 2016-04-03 NOTE — Progress Notes (Signed)
[facility]   [name], M.D.    Uvaldo Bristle. [name], P.A.-C      Starlyn Skeans, P.A.-C   [facility]                [address] [place], [place] [id number] Telephone [phone] Telefax [phone] _________________________________    Subjective:    Patient ID: [name] Yu, male    DOB: 14-Jul-1927, 82 y.o.   MRN: [id number]  HPI   Patient presents with a painful lump in his left scrotal skin fold. Also reports sx's consistent with positional vertigo.    Outpatient Prescriptions Prior to Visit  Medication Sig  . aspirin EC 81 MG Take 1 tablet (81 mg total) by mouth daily.  . TUMS E-X Take 2 tablets by mouth daily.  . Vit D 5000 UNITS  Take 1 tablet by mouth daily.  . citalopram  40 MG tablet TAKE ONE TABLET BY MOUTH ONCE DAILY.  Marland Kitchen diltiazem 60 MG tablet TAKE 1 TABLET BY MOUTH FOUR TIMES DAILY AS NEEDED.  . finasteride  5 MG tablet TAKE 1 TABLET BY MOUTH ONCE DAILY  . LORazepam  2 MG tablet TAKE 1/2 TO 1 TABLET BY MOUTH THREE TIMES DAILY AS DIRECTED.  . Magnesium 200 MG TABS Take 1 tablet by mouth daily.  . Multiple Vitamin  Take 1 capsule by mouth daily.  . Omega-3 FISH OIL  Take 1 tablet by mouth daily.  . simvastatin  40 MG tablet TAKE 1 TABLET BY MOUTH EVERY DAY FOR CHOLESTEROL  . vitamin C  500 MG tablet Take 1,000 mg by mouth daily.   Allergies  Allergen Reactions  . Ciprofloxacin     dysphoria  . Novocain [Procaine Hcl]   . Prednisone     Nervousness   Past Medical History  Diagnosis Date  . Hypertension   . Pre-diabetes   . Dyslipidemia   . OSA (obstructive sleep apnea)   . Atrial fib/flutter, transient     off coumadin since 11/2013- CHADSVAC of 2  . Colon polyps     2003 hyperplastic and tubuler adenoma  . Diverticulosis   . Rectus sheath hematoma 11/21/2013    Dr. Deatra Ina   . ASHD (arteriosclerotic heart disease)   . Depression    Past Surgical History   Procedure Laterality Date  . Hernia repair     Review of Systems 10 point systems review negative except as above.    Objective:   Physical Exam  BP 122/66 mmHg  Pulse 68  Temp(Src) 97.3 F (36.3 C)  Resp 16  Ht 5\' 10"  (1.778 m)  Wt 197 lb 9.6 oz (89.631 kg)  BMI 28.35 kg/m2  Exam finds a 1.5 cm x 1.5 cm tender sebaceous cyst of the L scrotal skin. No signs of cellulitis or lymphangitis.      Assessment & Plan:   1. Scrotal abscess  - cephALEXin (KEFLEX) 500 MG capsule; Take 1 capsule 4 x / day after meals and bedtime for Abscess  Dispense: 60 capsule; Refill: 1 - Recommended Hot tub soaks and advised of there possibility that it may spontaneous drain.   2. Benign paroxysmal positional vertigo, unspecified laterality  - meclizine (ANTIVERT) 25 MG tablet; Take 1 tablet 3 x / day for Dizziness / Vertigo  Dispense: 60 tablet; Refill: 3

## 2016-04-03 NOTE — Patient Instructions (Signed)
Benign Positional Vertigo Vertigo is the feeling that you or your surroundings are moving when they are not. Benign positional vertigo is the most common form of vertigo. The cause of this condition is not serious (is benign). This condition is triggered by certain movements and positions (is positional). This condition can be dangerous if it occurs while you are doing something that could endanger you or others, such as driving.  CAUSES In many cases, the cause of this condition is not known. It may be caused by a disturbance in an area of the inner ear that helps your brain to sense movement and balance. This disturbance can be caused by a viral infection (labyrinthitis), head injury, or repetitive motion. RISK FACTORS This condition is more likely to develop in:  Women.  People who are 50 years of age or older. SYMPTOMS Symptoms of this condition usually happen when you move your head or your eyes in different directions. Symptoms may start suddenly, and they usually last for less than a minute. Symptoms may include:  Loss of balance and falling.  Feeling like you are spinning or moving.  Feeling like your surroundings are spinning or moving.  Nausea and vomiting.  Blurred vision.  Dizziness.  Involuntary eye movement (nystagmus). Symptoms can be mild and cause only slight annoyance, or they can be severe and interfere with daily life. Episodes of benign positional vertigo may return (recur) over time, and they may be triggered by certain movements. Symptoms may improve over time. DIAGNOSIS This condition is usually diagnosed by medical history and a physical exam of the head, neck, and ears. You may be referred to a health care provider who specializes in ear, nose, and throat (ENT) problems (otolaryngologist) or a provider who specializes in disorders of the nervous system (neurologist). You may have additional testing, including:  MRI.  A CT scan.  Eye movement tests. Your  health care provider may ask you to change positions quickly while he or she watches you for symptoms of benign positional vertigo, such as nystagmus. Eye movement may be tested with an electronystagmogram (ENG), caloric stimulation, the Dix-Hallpike test, or the roll test.  An electroencephalogram (EEG). This records electrical activity in your brain.  Hearing tests. TREATMENT Usually, your health care provider will treat this by moving your head in specific positions to adjust your inner ear back to normal. Surgery may be needed in severe cases, but this is rare. In some cases, benign positional vertigo may resolve on its own in 2-4 weeks. HOME CARE INSTRUCTIONS Safety  Move slowly.Avoid sudden body or head movements.  Avoid driving.  Avoid operating heavy machinery.  Avoid doing any tasks that would be dangerous to you or others if a vertigo episode would occur.  If you have trouble walking or keeping your balance, try using a cane for stability. If you feel dizzy or unstable, sit down right away.  Return to your normal activities as told by your health care provider. Ask your health care provider what activities are safe for you. General Instructions  Take over-the-counter and prescription medicines only as told by your health care provider.  Avoid certain positions or movements as told by your health care provider.  Drink enough fluid to keep your urine clear or pale yellow.  Keep all follow-up visits as told by your health care provider. This is important. SEEK MEDICAL CARE IF:  You have a fever.  Your condition gets worse or you develop new symptoms.  Your family or friends   notice any behavioral changes.  Your nausea or vomiting gets worse.  You have numbness or a "pins and needles" sensation. SEEK IMMEDIATE MEDICAL CARE IF:  You have difficulty speaking or moving.  You are always dizzy.  You faint.  You develop severe headaches.  You have weakness in your  legs or arms.  You have changes in your hearing or vision.  You develop a stiff neck.  You develop sensitivity to light.   This information is not intended to replace advice given to you by your health care provider. Make sure you discuss any questions you have with your health care provider.   Document Released: 07/09/2006 Document Revised: 06/22/2015 Document Reviewed: 01/24/2015 Elsevier Interactive Patient Education 2016 Elsevier Inc.  

## 2016-04-25 ENCOUNTER — Other Ambulatory Visit: Payer: Self-pay | Admitting: Internal Medicine

## 2016-05-24 ENCOUNTER — Ambulatory Visit: Payer: Self-pay | Admitting: Physician Assistant

## 2016-05-29 ENCOUNTER — Encounter: Payer: Self-pay | Admitting: Physician Assistant

## 2016-05-29 ENCOUNTER — Ambulatory Visit (INDEPENDENT_AMBULATORY_CARE_PROVIDER_SITE_OTHER): Payer: Medicare Other | Admitting: Physician Assistant

## 2016-05-29 VITALS — BP 130/72 | HR 61 | Temp 97.5°F | Ht 70.0 in | Wt 198.2 lb

## 2016-05-29 DIAGNOSIS — F325 Major depressive disorder, single episode, in full remission: Secondary | ICD-10-CM

## 2016-05-29 DIAGNOSIS — I10 Essential (primary) hypertension: Secondary | ICD-10-CM

## 2016-05-29 DIAGNOSIS — Z79899 Other long term (current) drug therapy: Secondary | ICD-10-CM

## 2016-05-29 DIAGNOSIS — E559 Vitamin D deficiency, unspecified: Secondary | ICD-10-CM

## 2016-05-29 DIAGNOSIS — R7303 Prediabetes: Secondary | ICD-10-CM | POA: Diagnosis not present

## 2016-05-29 DIAGNOSIS — E782 Mixed hyperlipidemia: Secondary | ICD-10-CM | POA: Diagnosis not present

## 2016-05-29 DIAGNOSIS — I2583 Coronary atherosclerosis due to lipid rich plaque: Secondary | ICD-10-CM

## 2016-05-29 DIAGNOSIS — I2510 Atherosclerotic heart disease of native coronary artery without angina pectoris: Secondary | ICD-10-CM

## 2016-05-29 DIAGNOSIS — I482 Chronic atrial fibrillation, unspecified: Secondary | ICD-10-CM

## 2016-05-29 LAB — HEPATIC FUNCTION PANEL
ALT: 11 U/L (ref 9–46)
AST: 16 U/L (ref 10–35)
Albumin: 4.3 g/dL (ref 3.6–5.1)
Alkaline Phosphatase: 52 U/L (ref 40–115)
BILIRUBIN INDIRECT: 0.4 mg/dL (ref 0.2–1.2)
Bilirubin, Direct: 0.1 mg/dL (ref <=0.2)
TOTAL PROTEIN: 6.4 g/dL (ref 6.1–8.1)
Total Bilirubin: 0.5 mg/dL (ref 0.2–1.2)

## 2016-05-29 LAB — CBC WITH DIFFERENTIAL/PLATELET
BASOS ABS: 0 {cells}/uL (ref 0–200)
Basophils Relative: 0 %
EOS ABS: 273 {cells}/uL (ref 15–500)
Eosinophils Relative: 3 %
HEMATOCRIT: 46.9 % (ref 38.5–50.0)
HEMOGLOBIN: 16.1 g/dL (ref 13.2–17.1)
LYMPHS ABS: 1911 {cells}/uL (ref 850–3900)
MCH: 31.9 pg (ref 27.0–33.0)
MCHC: 34.3 g/dL (ref 32.0–36.0)
MCV: 93.1 fL (ref 80.0–100.0)
MPV: 8.8 fL (ref 7.5–12.5)
Monocytes Absolute: 819 cells/uL (ref 200–950)
Monocytes Relative: 9 %
NEUTROS ABS: 6097 {cells}/uL (ref 1500–7800)
Neutrophils Relative %: 67 %
Platelets: 217 10*3/uL (ref 140–400)
RBC: 5.04 MIL/uL (ref 4.20–5.80)
RDW: 13.6 % (ref 11.0–15.0)
WBC: 9.1 10*3/uL (ref 3.8–10.8)

## 2016-05-29 LAB — LIPID PANEL
CHOLESTEROL: 163 mg/dL (ref 125–200)
HDL: 58 mg/dL (ref >=40)
LDL Cholesterol: 68 mg/dL (ref <130)
Total CHOL/HDL Ratio: 2.8 Ratio (ref <=5.0)
Triglycerides: 186 mg/dL — ABNORMAL HIGH (ref <150)
VLDL: 37 mg/dL — AB (ref <30)

## 2016-05-29 LAB — BASIC METABOLIC PANEL WITH GFR
BUN: 11 mg/dL (ref 7–25)
CALCIUM: 9.6 mg/dL (ref 8.6–10.3)
CO2: 27 mmol/L (ref 20–31)
CREATININE: 0.87 mg/dL (ref 0.70–1.11)
Chloride: 103 mmol/L (ref 98–110)
GFR, EST NON AFRICAN AMERICAN: 78 mL/min (ref >=60)
GFR, Est African American: >89 mL/min (ref >=60)
GLUCOSE: 84 mg/dL (ref 65–99)
Potassium: 4.2 mmol/L (ref 3.5–5.3)
Sodium: 139 mmol/L (ref 135–146)

## 2016-05-29 LAB — TSH: TSH: 2.96 mIU/L (ref 0.40–4.50)

## 2016-05-29 NOTE — Patient Instructions (Addendum)
Get over the counter zantac or the generic over the counter 150mg  to take twice daily 500 milligrams of L-lysine one or two daily   Fatigue Fatigue is feeling tired all of the time, a lack of energy, or a lack of motivation. Occasional or mild fatigue is often a normal response to activity or life in general. However, long-lasting (chronic) or extreme fatigue may indicate an underlying medical condition. HOME CARE INSTRUCTIONS  Watch your fatigue for any changes. The following actions may help to lessen any discomfort you are feeling:  Talk to your health care provider about how much sleep you need each night. Try to get the required amount every night.  Take medicines only as directed by your health care provider.  Eat a healthy and nutritious diet. Ask your health care provider if you need help changing your diet.  Drink enough fluid to keep your urine clear or pale yellow.  Practice ways of relaxing, such as yoga, meditation, massage therapy, or acupuncture.  Exercise regularly.   Change situations that cause you stress. Try to keep your work and personal routine reasonable.  Do not abuse illegal drugs.  Limit alcohol intake to no more than 1 drink per day for nonpregnant women and 2 drinks per day for men. One drink equals 12 ounces of beer, 5 ounces of wine, or 1 ounces of hard liquor.  Take a multivitamin, if directed by your health care provider. SEEK MEDICAL CARE IF:   Your fatigue does not get better.  You have a fever.   You have unintentional weight loss or gain.  You have headaches.   You have difficulty:   Falling asleep.  Sleeping throughout the night.  You feel angry, guilty, anxious, or sad.   You are unable to have a bowel movement (constipation).   You skin is dry.   Your legs or another part of your body is swollen.  SEEK IMMEDIATE MEDICAL CARE IF:   You feel confused.   Your vision is blurry.  You feel faint or pass out.    You have a severe headache.   You have severe abdominal, pelvic, or back pain.   You have chest pain, shortness of breath, or an irregular or fast heartbeat.   You are unable to urinate or you urinate less than normal.   You develop abnormal bleeding, such as bleeding from the rectum, vagina, nose, lungs, or nipples.  You vomit blood.   You have thoughts about harming yourself or committing suicide.   You are worried that you might harm someone else.    This information is not intended to replace advice given to you by your health care provider. Make sure you discuss any questions you have with your health care provider.   Document Released: 07/29/2007 Document Revised: 10/22/2014 Document Reviewed: 02/02/2014 Elsevier Interactive Patient Education [employer].

## 2016-05-29 NOTE — Progress Notes (Signed)
Assessment and Plan:  Hypertension:  -Continue medication,  -monitor blood pressure at home.  -Continue DASH diet.   -Reminder to go to the ER if any CP, SOB, nausea, dizziness, severe HA, changes vision/speech, left arm numbness and tingling, and jaw pain.  Cholesterol: -Continue diet and exercise.  -Check cholesterol.   Pre-diabetes: -Continue diet and exercise.  -Check A1C  Vitamin D Def: -check level -continue medications.   CAD Control blood pressure, cholesterol, glucose, increase exercise.   Afib Rate controlled, not on coumadin, on bASA.  Depression in remission Continue celexa  Continue diet and meds as discussed. Further disposition pending results of labs.  HPI 82 y.o. male  presents for 3 month follow up with hypertension, hyperlipidemia, prediabetes and vitamin D.  Has a canker sore, using the lidocaine jelly at this time.    His blood pressure has been controlled at home, today their BP is BP: 130/72.   He does not workout. He denies chest pain, shortness of breath, dizziness.  He reports tinkering with his rental house and he is doing some gardening.  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 Negative stress test 2007.  He is on celexa at night.   He is on cholesterol medication and denies myalgias. His cholesterol is at goal. The cholesterol last visit was:   Lab Results  Component Value Date   CHOL 181 02/20/2016   HDL 60 02/20/2016   LDLCALC 85 02/20/2016   TRIG 180 (H) 02/20/2016   CHOLHDL 3.0 02/20/2016    He has been working on diet and exercise for prediabetes, and denies foot ulcerations, hyperglycemia, hypoglycemia , increased appetite, nausea, paresthesia of the feet, polydipsia, polyuria, visual disturbances, vomiting and weight loss. Last A1C in the office was:  Lab Results  Component Value Date   HGBA1C 5.6 02/20/2016   Patient is on Vitamin D supplement.  Lab Results   Component Value Date   VD25OH 58 02/20/2016     BMI is Body mass index is 28.44 kg/m., he is working on diet and exercise. Wt Readings from Last 3 Encounters:  05/29/16 198 lb 3.2 oz (89.9 kg)  04/03/16 197 lb 9.6 oz (89.6 kg)  02/20/16 192 lb 12.8 oz (87.5 kg)     Current Medications:  Current Outpatient Prescriptions on File Prior to Visit  Medication Sig Dispense Refill  . aspirin EC 81 MG tablet Take 1 tablet (81 mg total) by mouth daily. 90 tablet 3  . Calcium Carbonate Antacid (TUMS E-X PO) Take 2 tablets by mouth daily.    . Cholecalciferol 5000 UNITS TABS Take 1 tablet by mouth daily.    . citalopram (CELEXA) 40 MG tablet TAKE ONE TABLET BY MOUTH ONCE DAILY. 90 tablet 1  . diltiazem (CARDIZEM) 60 MG tablet TAKE 1 TABLET BY MOUTH FOUR TIMES DAILY AS NEEDED. 120 tablet 3  . finasteride (PROSCAR) 5 MG tablet TAKE 1 TABLET BY MOUTH EVERY DAY 90 tablet 1  . LORazepam (ATIVAN) 2 MG tablet TAKE 1/2 TO 1 TABLET BY MOUTH THREE TIMES DAILY AS DIRECTED. 90 tablet 5  . Magnesium 200 MG TABS Take 1 tablet by mouth daily.    . meclizine (ANTIVERT) 25 MG tablet Take 1 tablet 3 x / day for Dizziness / Vertigo 60 tablet 3  . Multiple Vitamin (MULTIVITAMIN) capsule Take 1 capsule by mouth daily.    . Omega-3 Fatty Acids (FISH OIL PO) Take 1 tablet by mouth daily.    Marland Kitchen  simvastatin (ZOCOR) 40 MG tablet TAKE 1 TABLET BY MOUTH EVERY DAY FOR CHOLESTEROL 30 tablet 0  . vitamin C (ASCORBIC ACID) 500 MG tablet Take 1,000 mg by mouth daily.     No current facility-administered medications on file prior to visit.     Medical History:  Past Medical History:  Diagnosis Date  . ASHD (arteriosclerotic heart disease)   . Atrial fib/flutter, transient    off coumadin since 11/2013- CHADSVAC of 2  . Colon polyps    2003 hyperplastic and tubuler adenoma  . Depression   . Diverticulosis   . Dyslipidemia   . Hypertension   . OSA (obstructive sleep apnea)   . Pre-diabetes   . Rectus sheath hematoma  11/21/2013   Dr. Deatra Ina     Allergies:  Allergies  Allergen Reactions  . Ciprofloxacin     dysphoria  . Novocain [Procaine Hcl]   . Prednisone     Nervousness     Review of Systems:  Review of Systems  Constitutional: Negative for chills and fever.  HENT: Negative for congestion, ear pain and sore throat.   Eyes: Negative.   Respiratory: Negative for cough, shortness of breath and wheezing.   Cardiovascular: Negative for chest pain, palpitations and leg swelling.  Gastrointestinal: Negative for blood in stool, constipation, diarrhea, heartburn, melena, nausea and vomiting.  Genitourinary: Negative.   Skin: Negative.   Neurological: Negative for dizziness and headaches.  Psychiatric/Behavioral: Negative for depression. The patient is nervous/anxious. The patient does not have insomnia.     Family history- Review and unchanged  Social history- Review and unchanged  Physical Exam: BP 130/72   Pulse 61   Temp 97.5 F (36.4 C)   Ht 5\' 10"  (1.778 m)   Wt 198 lb 3.2 oz (89.9 kg)   BMI 28.44 kg/m  Wt Readings from Last 3 Encounters:  05/29/16 198 lb 3.2 oz (89.9 kg)  04/03/16 197 lb 9.6 oz (89.6 kg)  02/20/16 192 lb 12.8 oz (87.5 kg)    General Appearance: Well nourished well developed, in no apparent distress. Eyes: PERRLA, EOMs, conjunctiva no swelling or erythema ENT/Mouth: Ear canals normal without obstruction, swelling, erythma, discharge.  TMs normal bilaterally.  Oropharynx moist, clear, without exudate, or postoropharyngeal swelling. Neck: Supple, thyroid normal,no cervical adenopathy  Respiratory: Respiratory effort normal, Breath sounds clear A&P without rhonchi, wheeze, or rale.  No retractions, no accessory usage. Cardio: RRR with no MRGs. Brisk peripheral pulses without edema.  Abdomen: Soft, + BS,  Non tender, no guarding, rebound, hernias, masses. Musculoskeletal: Full ROM, 5/5 strength, Normal gait Skin: Warm, dry without rashes, lesions, ecchymosis.   Neuro: Awake and oriented X 3, Cranial nerves intact. Normal muscle tone, no cerebellar symptoms. Psych: Normal affect, Insight and Judgment appropriate.    Vicie Mutters, PA-C 11:20 AM [facility]

## 2016-06-25 ENCOUNTER — Other Ambulatory Visit: Payer: Self-pay | Admitting: Internal Medicine

## 2016-07-19 ENCOUNTER — Other Ambulatory Visit: Payer: Self-pay | Admitting: Internal Medicine

## 2016-09-25 ENCOUNTER — Ambulatory Visit (INDEPENDENT_AMBULATORY_CARE_PROVIDER_SITE_OTHER): Payer: Medicare Other | Admitting: Internal Medicine

## 2016-09-25 ENCOUNTER — Encounter: Payer: Self-pay | Admitting: Internal Medicine

## 2016-09-25 VITALS — BP 124/68 | HR 64 | Temp 97.4°F | Resp 16 | Ht 70.0 in | Wt 196.4 lb

## 2016-09-25 DIAGNOSIS — G4733 Obstructive sleep apnea (adult) (pediatric): Secondary | ICD-10-CM

## 2016-09-25 DIAGNOSIS — R6889 Other general symptoms and signs: Secondary | ICD-10-CM | POA: Diagnosis not present

## 2016-09-25 DIAGNOSIS — I2583 Coronary atherosclerosis due to lipid rich plaque: Secondary | ICD-10-CM

## 2016-09-25 DIAGNOSIS — R7303 Prediabetes: Secondary | ICD-10-CM | POA: Diagnosis not present

## 2016-09-25 DIAGNOSIS — Z125 Encounter for screening for malignant neoplasm of prostate: Secondary | ICD-10-CM | POA: Diagnosis not present

## 2016-09-25 DIAGNOSIS — I482 Chronic atrial fibrillation, unspecified: Secondary | ICD-10-CM

## 2016-09-25 DIAGNOSIS — I10 Essential (primary) hypertension: Secondary | ICD-10-CM

## 2016-09-25 DIAGNOSIS — Z0001 Encounter for general adult medical examination with abnormal findings: Secondary | ICD-10-CM

## 2016-09-25 DIAGNOSIS — E782 Mixed hyperlipidemia: Secondary | ICD-10-CM

## 2016-09-25 DIAGNOSIS — Z1212 Encounter for screening for malignant neoplasm of rectum: Secondary | ICD-10-CM

## 2016-09-25 DIAGNOSIS — I2510 Atherosclerotic heart disease of native coronary artery without angina pectoris: Secondary | ICD-10-CM

## 2016-09-25 DIAGNOSIS — Z136 Encounter for screening for cardiovascular disorders: Secondary | ICD-10-CM

## 2016-09-25 DIAGNOSIS — Z79899 Other long term (current) drug therapy: Secondary | ICD-10-CM

## 2016-09-25 DIAGNOSIS — E559 Vitamin D deficiency, unspecified: Secondary | ICD-10-CM

## 2016-09-25 LAB — CBC WITH DIFFERENTIAL/PLATELET
BASOS ABS: 0 {cells}/uL (ref 0–200)
BASOS PCT: 0 %
EOS ABS: 240 {cells}/uL (ref 15–500)
Eosinophils Relative: 3 %
HCT: 47.3 % (ref 38.5–50.0)
Hemoglobin: 16.2 g/dL (ref 13.2–17.1)
LYMPHS PCT: 21 %
Lymphs Abs: 1680 cells/uL (ref 850–3900)
MCH: 32.2 pg (ref 27.0–33.0)
MCHC: 34.2 g/dL (ref 32.0–36.0)
MCV: 94 fL (ref 80.0–100.0)
MONO ABS: 720 {cells}/uL (ref 200–950)
MPV: 9 fL (ref 7.5–12.5)
Monocytes Relative: 9 %
NEUTROS ABS: 5360 {cells}/uL (ref 1500–7800)
Neutrophils Relative %: 67 %
Platelets: 200 10*3/uL (ref 140–400)
RBC: 5.03 MIL/uL (ref 4.20–5.80)
RDW: 13.8 % (ref 11.0–15.0)
WBC: 8 10*3/uL (ref 3.8–10.8)

## 2016-09-25 LAB — TSH: TSH: 1.95 m[IU]/L (ref 0.40–4.50)

## 2016-09-25 LAB — PSA: PSA: 1.5 ng/mL (ref <=4.0)

## 2016-09-25 NOTE — Progress Notes (Signed)
[facility]   [name], M.D.    Uvaldo Bristle. [name], P.A.-C      Starlyn Skeans, P.A.-C  [facility]                [address] [place], [place] [id number] Telephone [phone] Telefax [phone] Annual  Screening/Preventative Visit  & Comprehensive Evaluation & Examination     This very nice 82 y.o.  MWM presents for a Screening/Preventative Visit & comprehensive evaluation and management of multiple medical co-morbidities.  Patient has been followed for HTN,ASHD/cAfib,  Prediabetes, Hyperlipidemia and Vitamin D Deficiency.     HTN predates since 2005. Patient's BP has been controlled at home.  Today's BP is at goal - 124/68.  In 2007 she was dx'd with Afib and continued on Coumadin until he developed a spontaneous Rectus sheath hematoma and was switched to Low Dose ASA. Patient has a Negative Heart Cath in 2003 and negative Cardiolite tests in 2005 and 2007. Patient denies any cardiac symptoms as chest pain, palpitations, shortness of breath, dizziness or ankle swelling.     Patient's hyperlipidemia is controlled with diet and medications. Patient denies myalgias or other medication SE's. Last lipids were at goal albeit sl elevated Trig's: Lab Results  Component Value Date   CHOL 163 05/29/2016   HDL 58 05/29/2016   LDLCALC 68 05/29/2016   TRIG 186 (H) 05/29/2016   CHOLHDL 2.8 05/29/2016      Patient has prediabetes with A1c 5.7% in 2014 and patient denies reactive hypoglycemic symptoms, visual blurring, diabetic polys or paresthesias. Last A1c was at goal: Lab Results  Component Value Date   HGBA1C 5.6 02/20/2016       Finally, patient has history of Vitamin D Deficiency in 2008 of "33" and last vitamin D was at goal: Lab Results  Component Value Date   VD25OH 58 02/20/2016   Current Outpatient Prescriptions on File Prior to Visit  Medication Sig  . aspirin EC 81 MG tablet Take 1  tablet (81 mg total) by mouth daily.  . Calcium Carbonate Antacid (TUMS E-X PO) Take 2 tablets by mouth daily.  . Cholecalciferol 5000 UNITS TABS Take 1 tablet by mouth daily.  . citalopram (CELEXA) 40 MG tablet TAKE ONE TABLET BY MOUTH ONCE DAILY.  Marland Kitchen diltiazem (CARDIZEM) 60 MG tablet TAKE 1 TABLET BY MOUTH FOUR TIMES DAILY AS NEEDED.  . finasteride (PROSCAR) 5 MG tablet TAKE 1 TABLET BY MOUTH EVERY DAY  . LORazepam (ATIVAN) 2 MG tablet TAKE 1/2 TO 1 TABLET BY MOUTH THREE TIMES DAILY AS DIRECTED.  . Magnesium 200 MG TABS Take 1 tablet by mouth daily.  . meclizine (ANTIVERT) 25 MG tablet Take 1 tablet 3 x / day for Dizziness / Vertigo  . Multiple Vitamin (MULTIVITAMIN) capsule Take 1 capsule by mouth daily.  . Omega-3 Fatty Acids (FISH OIL PO) Take 1 tablet by mouth daily.  . simvastatin (ZOCOR) 40 MG tablet TAKE 1 TABLET BY MOUTH EVERY DAY FOR CHOLESTEROL  . vitamin C (ASCORBIC ACID) 500 MG tablet Take 1,000 mg by mouth daily.   No current facility-administered medications on file prior to visit.    Allergies  Allergen Reactions  . Ciprofloxacin     dysphoria  . Novocain [Procaine Hcl]   . Prednisone     Nervousness   Past Medical History:  Diagnosis Date  . ASHD (arteriosclerotic  heart disease)   . Atrial fib/flutter, transient    off coumadin since 11/2013- CHADSVAC of 2  . Colon polyps    2003 hyperplastic and tubuler adenoma  . Depression   . Diverticulosis   . Dyslipidemia   . Hypertension   . OSA (obstructive sleep apnea)   . Pre-diabetes   . Rectus sheath hematoma 11/21/2013   Dr. Deatra Ina    Health Maintenance  Topic Date Due  . INFLUENZA VACCINE  05/15/2016  . TETANUS/TDAP  07/06/2024  . ZOSTAVAX  Completed  . PNA vac Low Risk Adult  Completed   Immunization History  Administered Date(s) Administered  . DT 07/06/2014  . Influenza Split 07/20/2014, 07/18/2016  . Influenza, High Dose Seasonal PF 08/22/2015  . Pneumococcal Conjugate-13 07/06/2014  . Pneumococcal  Polysaccharide-23 06/09/2013  . Zoster 10/15/2005   Past Surgical History:  Procedure Laterality Date  . HERNIA REPAIR     Family History  Problem Relation Age of Onset  . Diabetes Brother   . Alzheimer's disease Mother   . Breast cancer Sister   . Rectal cancer Sister   . Breast cancer Daughter   . Ulcerative colitis Sister   . Heart disease Brother   . Heart disease Sister   . Kidney cancer Father    Social History   Social History  . Marital status: Married    Spouse name: N/A  . Number of children: 5  . Years of education: N/A   Occupational History  . Retired    Social History Main Topics  . Smoking status: Former Smoker    Quit date: 09/14/1993  . Smokeless tobacco: Never Used  . Alcohol use No  . Drug use: No  . Sexual activity: Not on file    ROS Constitutional: Denies fever, chills, weight loss/gain, headaches, insomnia,  night sweats or change in appetite. Does c/o fatigue. Eyes: Denies redness, blurred vision, diplopia, discharge, itchy or watery eyes.  ENT: Denies discharge, congestion, post nasal drip, epistaxis, sore throat, earache, hearing loss, dental pain, Tinnitus, Vertigo, Sinus pain or snoring.  Cardio: Denies chest pain, palpitations, irregular heartbeat, syncope, dyspnea, diaphoresis, orthopnea, PND, claudication or edema Respiratory: denies cough, dyspnea, DOE, pleurisy, hoarseness, laryngitis or wheezing.  Gastrointestinal: Denies dysphagia, heartburn, reflux, water brash, pain, cramps, nausea, vomiting, bloating, diarrhea, constipation, hematemesis, melena, hematochezia, jaundice or hemorrhoids Genitourinary: Denies dysuria, frequency, urgency, nocturia, hesitancy, discharge, hematuria or flank pain Musculoskeletal: Denies arthralgia, myalgia, stiffness, Jt. Swelling, pain, limp or strain/sprain. Denies Falls. Skin: Denies puritis, rash, hives, warts, acne, eczema or change in skin lesion Neuro: No weakness, tremor, incoordination, spasms,  paresthesia or pain Psychiatric: Denies confusion, memory loss or sensory loss. Denies Depression. Endocrine: Denies change in weight, skin, hair change, nocturia, and paresthesia, diabetic polys, visual blurring or hyper / hypo glycemic episodes.  Heme/Lymph: No excessive bleeding, bruising or enlarged lymph nodes.  Physical Exam  BP 124/68   Pulse 64   Temp 97.4 F (36.3 C)   Resp 16   Ht 5\' 10"  (1.778 m)   Wt 196 lb 6.4 oz (89.1 kg)   BMI 28.18 kg/m   General Appearance: Well nourished, in no apparent distress.  Eyes: PERRLA, EOMs, conjunctiva no swelling or erythema, normal fundi and vessels. Sinuses: No frontal/maxillary tenderness ENT/Mouth: EACs patent / TMs  nl. Nares clear without erythema, swelling, mucoid exudates. Oral hygiene is good. No erythema, swelling, or exudate. Tongue normal, non-obstructing. Tonsils not swollen or erythematous. Hearing normal.  Neck: Supple, thyroid normal. No bruits,  nodes or JVD. Respiratory: Respiratory effort normal.  BS equal and clear bilateral without rales, rhonci, wheezing or stridor. Cardio: Heart sounds are soft with sl irregular rate and rhythm and no murmurs, rubs or gallops. Peripheral pulses are normal and equal bilaterally without edema. No aortic or femoral bruits. Chest: symmetric with normal excursions and percussion.  Abdomen: Soft, with Nl bowel sounds. Nontender, no guarding, rebound, hernias, masses, or organomegaly.  Lymphatics: Non tender without lymphadenopathy.  Genitourinary: No hernias.Testes nl. DRE - prostate nl for age - smooth & firm w/o nodules. Musculoskeletal: Full ROM all peripheral extremities, joint stability, 5/5 strength, and normal gait. Skin: Warm and dry without rashes, lesions, cyanosis, clubbing or  ecchymosis.  Neuro: Cranial nerves intact, reflexes equal bilaterally. Normal muscle tone, no cerebellar symptoms. Sensation intact.  Pysch: Alert and oriented X 3 with normal affect, insight and judgment  appropriate.   Assessment and Plan  1. Annual Preventative/Screening Exam    2. Essential hypertension  - Microalbumin / creatinine urine ratio - EKG 12-Lead - [place], RETROPERITNL ABD,  LTD - Urinalysis, Routine w reflex microscopic - CBC with Differential/Platelet - BASIC METABOLIC PANEL WITH GFR - TSH  3. Mixed hyperlipidemia  - EKG 12-Lead - [place], RETROPERITNL ABD,  LTD - Hepatic function panel - Lipid panel - TSH  4. Pre-diabetes  - EKG 12-Lead - [place], RETROPERITNL ABD,  LTD - Hemoglobin A1c - Insulin, random  5. Vitamin D deficiency  - VITAMIN D 25 Hydroxy   6. ASHD   7. Chronic atrial fibrillation (HCC)   8. OSA (obstructive sleep apnea)   9. Screening for rectal cancer  - POC Hemoccult Bld/Stl   10. Prostate cancer screening  - PSA  11. Screening for ischemic heart disease  - EKG 12-Lead  12. Screening for AAA (aortic abdominal aneurysm)  - [place], RETROPERITNL ABD,  LTD  13. Medication management  - Urinalysis, Routine w reflex microscopic - CBC with Differential/Platelet - BASIC METABOLIC PANEL WITH GFR - Hepatic function panel - Magnesium       Continue prudent diet as discussed, weight control, BP monitoring, regular exercise, and medications as discussed.  Discussed med effects and SE's. Routine screening labs and tests as requested with regular follow-up as recommended. Over 40 minutes of exam, counseling, chart review and high complex critical decision making was performed

## 2016-09-25 NOTE — Patient Instructions (Signed)

## 2016-09-26 LAB — BASIC METABOLIC PANEL WITH GFR
BUN: 10 mg/dL (ref 7–25)
CHLORIDE: 104 mmol/L (ref 98–110)
CO2: 28 mmol/L (ref 20–31)
Calcium: 9.5 mg/dL (ref 8.6–10.3)
Creat: 0.88 mg/dL (ref 0.70–1.11)
GFR, Est African American: 89 mL/min (ref >=60)
GFR, Est Non African American: 77 mL/min (ref >=60)
GLUCOSE: 71 mg/dL (ref 65–99)
POTASSIUM: 4.3 mmol/L (ref 3.5–5.3)
Sodium: 141 mmol/L (ref 135–146)

## 2016-09-26 LAB — INSULIN, RANDOM: INSULIN: 13.7 u[IU]/mL (ref 2.0–19.6)

## 2016-09-26 LAB — LIPID PANEL
Cholesterol: 129 mg/dL (ref <200)
HDL: 54 mg/dL (ref >40)
LDL CALC: 52 mg/dL (ref <100)
Total CHOL/HDL Ratio: 2.4 Ratio (ref <5.0)
Triglycerides: 115 mg/dL (ref <150)
VLDL: 23 mg/dL (ref <30)

## 2016-09-26 LAB — HEPATIC FUNCTION PANEL
ALBUMIN: 4.2 g/dL (ref 3.6–5.1)
ALK PHOS: 54 U/L (ref 40–115)
ALT: 11 U/L (ref 9–46)
AST: 19 U/L (ref 10–35)
Bilirubin, Direct: 0.2 mg/dL (ref <=0.2)
Indirect Bilirubin: 0.4 mg/dL (ref 0.2–1.2)
TOTAL PROTEIN: 6.1 g/dL (ref 6.1–8.1)
Total Bilirubin: 0.6 mg/dL (ref 0.2–1.2)

## 2016-09-26 LAB — MICROALBUMIN / CREATININE URINE RATIO
Creatinine, Urine: 148 mg/dL (ref 20–370)
MICROALB UR: 1.6 mg/dL
MICROALB/CREAT RATIO: 11 ug/mg{creat} (ref <30)

## 2016-09-26 LAB — URINALYSIS, ROUTINE W REFLEX MICROSCOPIC
BILIRUBIN URINE: NEGATIVE
GLUCOSE, UA: NEGATIVE
HGB URINE DIPSTICK: NEGATIVE
Ketones, ur: NEGATIVE
LEUKOCYTES UA: NEGATIVE
Nitrite: NEGATIVE
PH: 6.5 (ref 5.0–8.0)
Protein, ur: NEGATIVE
Specific Gravity, Urine: 1.019 (ref 1.001–1.035)

## 2016-09-26 LAB — MAGNESIUM: Magnesium: 2 mg/dL (ref 1.5–2.5)

## 2016-09-26 LAB — HEMOGLOBIN A1C
Hgb A1c MFr Bld: 5.3 % (ref <5.7)
MEAN PLASMA GLUCOSE: 105 mg/dL

## 2016-09-26 LAB — VITAMIN D 25 HYDROXY (VIT D DEFICIENCY, FRACTURES): Vit D, 25-Hydroxy: 72 ng/mL (ref 30–100)

## 2016-09-29 ENCOUNTER — Encounter: Payer: Self-pay | Admitting: Internal Medicine

## 2016-10-11 ENCOUNTER — Other Ambulatory Visit: Payer: Self-pay | Admitting: *Deleted

## 2016-10-11 DIAGNOSIS — F411 Generalized anxiety disorder: Secondary | ICD-10-CM

## 2016-10-11 MED ORDER — LORAZEPAM 2 MG PO TABS: ORAL_TABLET | ORAL | 5 refills | Status: DC

## 2016-10-17 ENCOUNTER — Other Ambulatory Visit: Payer: Self-pay

## 2016-10-17 DIAGNOSIS — Z1212 Encounter for screening for malignant neoplasm of rectum: Secondary | ICD-10-CM

## 2016-10-17 LAB — POC HEMOCCULT BLD/STL (HOME/3-CARD/SCREEN)
Card #2 Fecal Occult Blod, POC: NEGATIVE
Card #3 Fecal Occult Blood, POC: NEGATIVE
FECAL OCCULT BLD: NEGATIVE

## 2016-10-24 ENCOUNTER — Other Ambulatory Visit: Payer: Self-pay | Admitting: Internal Medicine

## 2016-10-24 ENCOUNTER — Other Ambulatory Visit: Payer: Self-pay | Admitting: Cardiovascular Disease

## 2016-11-13 ENCOUNTER — Other Ambulatory Visit: Payer: Self-pay | Admitting: Internal Medicine

## 2016-11-13 ENCOUNTER — Encounter: Payer: Self-pay | Admitting: Internal Medicine

## 2016-11-13 MED ORDER — FUROSEMIDE 40 MG PO TABS: 40.0000 mg | ORAL_TABLET | Freq: Every day | ORAL | 3 refills | Status: DC

## 2016-12-15 DIAGNOSIS — G319 Degenerative disease of nervous system, unspecified: Secondary | ICD-10-CM | POA: Diagnosis not present

## 2016-12-15 DIAGNOSIS — Z87891 Personal history of nicotine dependence: Secondary | ICD-10-CM | POA: Diagnosis not present

## 2016-12-15 DIAGNOSIS — W01198A Fall on same level from slipping, tripping and stumbling with subsequent striking against other object, initial encounter: Secondary | ICD-10-CM | POA: Diagnosis not present

## 2016-12-15 DIAGNOSIS — S51012A Laceration without foreign body of left elbow, initial encounter: Secondary | ICD-10-CM | POA: Diagnosis not present

## 2016-12-15 DIAGNOSIS — S92535A Nondisplaced fracture of distal phalanx of left lesser toe(s), initial encounter for closed fracture: Secondary | ICD-10-CM | POA: Diagnosis not present

## 2016-12-15 DIAGNOSIS — Z7982 Long term (current) use of aspirin: Secondary | ICD-10-CM | POA: Diagnosis not present

## 2016-12-15 DIAGNOSIS — R51 Headache: Secondary | ICD-10-CM | POA: Diagnosis not present

## 2016-12-15 DIAGNOSIS — W1830XA Fall on same level, unspecified, initial encounter: Secondary | ICD-10-CM | POA: Diagnosis not present

## 2016-12-15 DIAGNOSIS — S0003XA Contusion of scalp, initial encounter: Secondary | ICD-10-CM | POA: Diagnosis not present

## 2016-12-15 DIAGNOSIS — I4891 Unspecified atrial fibrillation: Secondary | ICD-10-CM | POA: Diagnosis not present

## 2016-12-15 DIAGNOSIS — R410 Disorientation, unspecified: Secondary | ICD-10-CM | POA: Diagnosis not present

## 2016-12-15 DIAGNOSIS — Z7901 Long term (current) use of anticoagulants: Secondary | ICD-10-CM | POA: Diagnosis not present

## 2016-12-15 DIAGNOSIS — S0990XA Unspecified injury of head, initial encounter: Secondary | ICD-10-CM | POA: Diagnosis not present

## 2016-12-15 DIAGNOSIS — R918 Other nonspecific abnormal finding of lung field: Secondary | ICD-10-CM | POA: Diagnosis not present

## 2016-12-15 DIAGNOSIS — M47812 Spondylosis without myelopathy or radiculopathy, cervical region: Secondary | ICD-10-CM | POA: Diagnosis not present

## 2016-12-15 DIAGNOSIS — S199XXA Unspecified injury of neck, initial encounter: Secondary | ICD-10-CM | POA: Diagnosis not present

## 2016-12-15 DIAGNOSIS — S92425A Nondisplaced fracture of distal phalanx of left great toe, initial encounter for closed fracture: Secondary | ICD-10-CM | POA: Diagnosis not present

## 2016-12-15 DIAGNOSIS — W19XXXA Unspecified fall, initial encounter: Secondary | ICD-10-CM | POA: Diagnosis not present

## 2016-12-18 ENCOUNTER — Other Ambulatory Visit: Payer: Self-pay | Admitting: Internal Medicine

## 2016-12-19 ENCOUNTER — Telehealth: Payer: Self-pay | Admitting: *Deleted

## 2016-12-19 NOTE — Telephone Encounter (Signed)
[facility].  The CXR done there showed a spot on his lung that appears to be larger than the previous CXR.  The patient has an OV here on 12/27/2016.  Per Dr Melford Aase, it is OK to wait until his appointment .He reviewed the x-ray report and recommends no x-rays at this time.  Patient is aware.

## 2016-12-27 ENCOUNTER — Ambulatory Visit (INDEPENDENT_AMBULATORY_CARE_PROVIDER_SITE_OTHER): Payer: Medicare Other | Admitting: Internal Medicine

## 2016-12-27 ENCOUNTER — Encounter: Payer: Self-pay | Admitting: Internal Medicine

## 2016-12-27 VITALS — BP 122/80 | HR 80 | Temp 97.3°F | Resp 16 | Ht 70.0 in | Wt 198.4 lb

## 2016-12-27 DIAGNOSIS — E559 Vitamin D deficiency, unspecified: Secondary | ICD-10-CM | POA: Diagnosis not present

## 2016-12-27 DIAGNOSIS — E782 Mixed hyperlipidemia: Secondary | ICD-10-CM

## 2016-12-27 DIAGNOSIS — I2510 Atherosclerotic heart disease of native coronary artery without angina pectoris: Secondary | ICD-10-CM

## 2016-12-27 DIAGNOSIS — Z79899 Other long term (current) drug therapy: Secondary | ICD-10-CM

## 2016-12-27 DIAGNOSIS — G4733 Obstructive sleep apnea (adult) (pediatric): Secondary | ICD-10-CM

## 2016-12-27 DIAGNOSIS — I10 Essential (primary) hypertension: Secondary | ICD-10-CM | POA: Diagnosis not present

## 2016-12-27 DIAGNOSIS — R7303 Prediabetes: Secondary | ICD-10-CM | POA: Diagnosis not present

## 2016-12-27 DIAGNOSIS — I482 Chronic atrial fibrillation, unspecified: Secondary | ICD-10-CM

## 2016-12-27 DIAGNOSIS — I2583 Coronary atherosclerosis due to lipid rich plaque: Secondary | ICD-10-CM | POA: Diagnosis not present

## 2016-12-27 LAB — TSH: TSH: 1.66 m[IU]/L (ref 0.40–4.50)

## 2016-12-27 LAB — LIPID PANEL
Cholesterol: 165 mg/dL (ref <200)
HDL: 50 mg/dL (ref >40)
LDL Cholesterol: 92 mg/dL (ref <100)
Total CHOL/HDL Ratio: 3.3 Ratio (ref <5.0)
Triglycerides: 117 mg/dL (ref <150)
VLDL: 23 mg/dL (ref <30)

## 2016-12-27 LAB — CBC WITH DIFFERENTIAL/PLATELET
BASOS ABS: 0 {cells}/uL (ref 0–200)
BASOS PCT: 0 %
EOS ABS: 308 {cells}/uL (ref 15–500)
Eosinophils Relative: 4 %
HCT: 47.2 % (ref 38.5–50.0)
Hemoglobin: 15.7 g/dL (ref 13.2–17.1)
LYMPHS PCT: 29 %
Lymphs Abs: 2233 cells/uL (ref 850–3900)
MCH: 31 pg (ref 27.0–33.0)
MCHC: 33.3 g/dL (ref 32.0–36.0)
MCV: 93.1 fL (ref 80.0–100.0)
MONOS PCT: 11 %
MPV: 9.1 fL (ref 7.5–12.5)
Monocytes Absolute: 847 cells/uL (ref 200–950)
Neutro Abs: 4312 cells/uL (ref 1500–7800)
Neutrophils Relative %: 56 %
PLATELETS: 226 10*3/uL (ref 140–400)
RBC: 5.07 MIL/uL (ref 4.20–5.80)
RDW: 13.7 % (ref 11.0–15.0)
WBC: 7.7 10*3/uL (ref 3.8–10.8)

## 2016-12-27 LAB — HEPATIC FUNCTION PANEL
ALT: 11 U/L (ref 9–46)
AST: 18 U/L (ref 10–35)
Albumin: 4 g/dL (ref 3.6–5.1)
Alkaline Phosphatase: 59 U/L (ref 40–115)
BILIRUBIN INDIRECT: 0.5 mg/dL (ref 0.2–1.2)
BILIRUBIN TOTAL: 0.6 mg/dL (ref 0.2–1.2)
Bilirubin, Direct: 0.1 mg/dL (ref <=0.2)
Total Protein: 6.2 g/dL (ref 6.1–8.1)

## 2016-12-27 LAB — BASIC METABOLIC PANEL WITH GFR
BUN: 11 mg/dL (ref 7–25)
CALCIUM: 9.9 mg/dL (ref 8.6–10.3)
CO2: 27 mmol/L (ref 20–31)
CREATININE: 0.91 mg/dL (ref 0.70–1.11)
Chloride: 104 mmol/L (ref 98–110)
GFR, Est African American: 87 mL/min (ref >=60)
GFR, Est Non African American: 76 mL/min (ref >=60)
Glucose, Bld: 75 mg/dL (ref 65–99)
Potassium: 4.3 mmol/L (ref 3.5–5.3)
Sodium: 140 mmol/L (ref 135–146)

## 2016-12-27 NOTE — Patient Instructions (Signed)

## 2016-12-28 LAB — HEMOGLOBIN A1C
HEMOGLOBIN A1C: 5.3 % (ref <5.7)
Mean Plasma Glucose: 105 mg/dL

## 2016-12-28 LAB — MAGNESIUM: MAGNESIUM: 2.1 mg/dL (ref 1.5–2.5)

## 2016-12-28 LAB — VITAMIN D 25 HYDROXY (VIT D DEFICIENCY, FRACTURES): VIT D 25 HYDROXY: 67 ng/mL (ref 30–100)

## 2016-12-30 ENCOUNTER — Encounter: Payer: Self-pay | Admitting: Internal Medicine

## 2016-12-30 NOTE — Progress Notes (Signed)
This very nice 83 y.o. MWM presents for 3 month follow up with Hypertension, Hyperlipidemia, Pre-Diabetes and Vitamin D Deficiency.      Patient is treated for HTN (2005) & BP has been controlled at home. Today's BP is at goal -  122/80. In 2007 patient was started on Coumadin for Afib til d/c'd after a spontaneous Rectus sheath hematoma and since has only been on low dose bASA. In 2003, he had a negative heart cath and in 2005 & 2007 , he had negative Cardiolite studies.  Patient has had no complaints of any cardiac type chest pain, palpitations, dyspnea/orthopnea/PND, dizziness, claudication, or dependent edema. Last week he tripped and had a fall  (w/o LOC) and was evaluated at [facility] ER and had neg CT scans of the neck & head and a portable CXR was questionable for an increased density at the CP angles and a dedicated CT scan was recommended, but NO standard erect PA & lateral CXR was done or recommended. Patient is asymptomatic w/o c/o dyspnea or chest pain/discomfort.      Hyperlipidemia is controlled with diet & meds. Patient denies myalgias or other med SE's. Last Lipids were at goal taking only 1/2 tab Simvastatin 2 x / week: Lab Results  Component Value Date   CHOL 165 12/27/2016   HDL 50 12/27/2016   LDLCALC 92 12/27/2016   TRIG 117 12/27/2016   CHOLHDL 3.3 12/27/2016      Also, the patient has history of PreDiabetes (A1c 5.7% in 2014) and has had no symptoms of reactive hypoglycemia, diabetic polys, paresthesias or visual blurring.  Last A1c was at goal: Lab Results  Component Value Date   HGBA1C 5.3 12/27/2016      Further, the patient also has history of Vitamin D Deficiency and supplements vitamin D without any suspected side-effects. Last vitamin D was   Lab Results  Component Value Date   VD25OH 67 12/27/2016   Current Outpatient Prescriptions on File Prior to Visit  Medication Sig  . aspirin EC 81 MG tablet Take 1 tablet (81 mg total) by mouth daily.  . Calcium  Carbonate Antacid (TUMS E-X PO) Take 2 tablets by mouth daily.  . Cholecalciferol 5000 UNITS TABS Take 1 tablet by mouth daily.  . citalopram (CELEXA) 40 MG tablet TAKE ONE TABLET BY MOUTH ONCE DAILY.  Marland Kitchen diltiazem (CARDIZEM) 60 MG tablet TAKE 1 TABLET BY MOUTH FOUR TIMES DAILY AS NEEDED.  . finasteride (PROSCAR) 5 MG tablet TAKE 1 TABLET BY MOUTH EVERY DAY  . LORazepam (ATIVAN) 2 MG tablet TAKE 1/2 TO 1 TABLET BY MOUTH THREE TIMES DAILY AS DIRECTED.  . Magnesium 200 MG TABS Take 1 tablet by mouth daily.  . Multiple Vitamin (MULTIVITAMIN) capsule Take 1 capsule by mouth daily.  . Omega-3 Fatty Acids (FISH OIL PO) Take 1 tablet by mouth daily.  . simvastatin (ZOCOR) 40 MG tablet TAKE 1 TABLET BY MOUTH EVERY DAY FOR CHOLESTEROL  . vitamin C (ASCORBIC ACID) 500 MG tablet Take 1,000 mg by mouth daily.  . furosemide (LASIX) 40 MG tablet Take 1 tablet (40 mg total) by mouth daily. (Patient not taking: Reported on 12/27/2016)   No current facility-administered medications on file prior to visit.    Allergies  Allergen Reactions  . Ciprofloxacin     dysphoria  . Novocain [Procaine Hcl]   . Prednisone     Nervousness   PMHx:   Past Medical History:  Diagnosis Date  . ASHD (arteriosclerotic  heart disease)   . Atrial fib/flutter, transient    off coumadin since 11/2013- CHADSVAC of 2  . Colon polyps    2003 hyperplastic and tubuler adenoma  . Depression   . Diverticulosis   . Dyslipidemia   . Hypertension   . OSA (obstructive sleep apnea)   . Pre-diabetes   . Rectus sheath hematoma 11/21/2013   Dr. Deatra Ina    Immunization History  Administered Date(s) Administered  . DT 07/06/2014  . Influenza Split 07/20/2014, 07/18/2016  . Influenza, High Dose Seasonal PF 08/22/2015  . Pneumococcal Conjugate-13 07/06/2014  . Pneumococcal Polysaccharide-23 06/09/2013  . Td 12/15/2016  . Zoster 10/15/2005   Past Surgical History:  Procedure Laterality Date  . HERNIA REPAIR     FHx:    Reviewed /  unchanged  SHx:    Reviewed / unchanged  Systems Review:  Constitutional: Denies fever, chills, wt changes, headaches, insomnia, fatigue, night sweats, change in appetite. Eyes: Denies redness, blurred vision, diplopia, discharge, itchy, watery eyes.  ENT: Denies discharge, congestion, post nasal drip, epistaxis, sore throat, earache, hearing loss, dental pain, tinnitus, vertigo, sinus pain, snoring.  CV: Denies chest pain, palpitations, irregular heartbeat, syncope, dyspnea, diaphoresis, orthopnea, PND, claudication or edema. Respiratory: denies cough, dyspnea, DOE, pleurisy, hoarseness, laryngitis, wheezing.  Gastrointestinal: Denies dysphagia, odynophagia, heartburn, reflux, water brash, abdominal pain or cramps, nausea, vomiting, bloating, diarrhea, constipation, hematemesis, melena, hematochezia  or hemorrhoids. Genitourinary: Denies dysuria, frequency, urgency, nocturia, hesitancy, discharge, hematuria or flank pain. Musculoskeletal: Denies arthralgias, myalgias, stiffness, jt. swelling, pain, limping or strain/sprain.  Skin: Denies pruritus, rash, hives, warts, acne, eczema or change in skin lesion(s). Neuro: No weakness, tremor, incoordination, spasms, paresthesia or pain. Psychiatric: Denies confusion, memory loss or sensory loss. Endo: Denies change in weight, skin or hair change.  Heme/Lymph: No excessive bleeding, bruising or enlarged lymph nodes.  Physical Exam  BP 122/80   Pulse 80   Temp 97.3 F (36.3 C)   Resp 16   Ht 5\' 10"  (1.778 m)   Wt 198 lb 6.4 oz (90 kg)   BMI 28.47 kg/m   Appears well nourished and in no distress.  Eyes: PERRLA, EOMs, conjunctiva no swelling or erythema. Sinuses: No frontal/maxillary tenderness ENT/Mouth: EAC's clear, TM's nl w/o erythema, bulging. Nares clear w/o erythema, swelling, exudates. Oropharynx clear without erythema or exudates. Oral hygiene is good. Tongue normal, non obstructing. Hearing intact.  Neck: Supple. Thyroid nl. Car  2+/2+ without bruits, nodes or JVD. Chest: Respirations nl with BS clear & equal w/o rales, rhonchi, wheezing or stridor.  Cor: Heart sounds normal w/ regular rate and rhythm without sig. murmurs, gallops, clicks, or rubs. Peripheral pulses normal and equal  without edema.  Abdomen: Soft & bowel sounds normal. Non-tender w/o guarding, rebound, hernias, masses, or organomegaly.  Lymphatics: Unremarkable.  Musculoskeletal: Full ROM all peripheral extremities, joint stability, 5/5 strength, and normal gait.  Skin: Warm, dry without exposed rashes, lesions or ecchymosis apparent.  Neuro: Cranial nerves intact, reflexes equal bilaterally. Sensory-motor testing grossly intact. Tendon reflexes grossly intact.  Pysch: Alert & oriented x 3.  Insight and judgement nl & appropriate. No ideations.  Assessment and Plan:   1. Essential hypertension  - Continue medication, monitor blood pressure at home.  - Continue DASH diet. Reminder to go to the ER if any CP,  SOB, nausea, dizziness, severe HA, changes vision/speech,  left arm numbness and tingling and jaw pain.  - CBC with Differential/Platelet - BASIC METABOLIC PANEL WITH GFR - Magnesium -  TSH  2. Mixed hyperlipidemia  - Continue diet/meds, exercise,& lifestyle modifications.  - Continue monitor periodic cholesterol/liver & renal functions   - patient advised that in consideration of his age and negative cardiac testing in the past that there would be limited to no benefit benefit for continuing the low dose Simvastatin that he's been taking and hence recommended stopping it.    - Hepatic function panel - Lipid panel - TSH  3. Pre-diabetes  - Continue diet, exercise, lifestyle modifications.  - Monitor appropriate labs.  - Hemoglobin A1c - Insulin, random  4. Vitamin D deficiency  - Continue supplementation. - VITAMIN D 25 Hydroxy  5. Coronary artery disease due to lipid rich plaque  - Lipid panel  6. Chronic atrial  fibrillation (HCC)   7. OSA (obstructive sleep apnea)   8. Medication management  - CBC with Differential/Platelet - BASIC METABOLIC PANEL WITH GFR - Hepatic function panel - Magnesium - Lipid panel - TSH - Hemoglobin A1c - Insulin, random - VITAMIN D 25 Hydroxy        Also, advised patient that the yield for finding an remedial abnormality on a CT scan of the chest would be very low particularly in consideration of huis age,  the increased radiation exposure and the fact that the CXR was portable. Patient is recommended regular exercise, BP monitoring, weight control, and discussed med and SE's. Recommended labs to assess and monitor clinical status. Further disposition pending results of labs. Over 30 minutes of exam, counseling, chart review was performed

## 2016-12-31 LAB — INSULIN, RANDOM: INSULIN: 6.7 u[IU]/mL (ref 2.0–19.6)

## 2016-12-31 NOTE — Progress Notes (Signed)
Cardiology Office Note  Date:  01/01/2017   ID:  ELEX MAINWARING, DOB 12/14/1926, MRN [id number]  PCP:  Alesia Richards, MD   Chief Complaint  Patient presents with  . other    12 month follow up. Patient states he is doing well. Meds reviewed verbally with patient.     HPI:  [name] is a 83 yo gentleman with no known coronary artery disease, previously followed at [facility], history of HTN, elevated lipids and sleep apnea, bradycardia on calcium channel blockers and beta blockers, presents for routine followup of his hypertension and atrial fibrillation.  Previously followed at the [facility]  prior pipe smoking for 20 years.  In follow-up today he reports that he recently had a fall, Fell head first hit left Side of his face [name] Yu to [facility] for further evaluation Hospital records reviewed He takes daily aspirin (formerly on warfarin but d/c'd in 2014 for rectal sheath hematoma),  -CT brain: Study is mildly limited by motion. There is a subcutaneous hematoma in the soft tissues of the left frontotemporal region.  XR L foot: Nondisplaced fracture of the distal phalanx of the great toe.  Diagnosed withpost-concussive sx Wife presents with him today, reports that he feels fine, back to his baseline  No regular activity, no exercise, has some gait instability  Notes from the hospital indicatePer EMS, patient had a mechanical fall earlier today. Tripped on a sock. [place] and hit his head on the washer machine. Denied LOC. Was GCS 15 on EMS arrival and while en route, patient became more sluggish and confused. States he had a 5/10 headache and was worried about a head injury. Denies any other injury.  Denies any significant palpitations or tachycardia concerning for atrial fibrillation He does not check his heart rate or blood pressure at home He denies any chest pain concerning for angina even on exertion  EKG on today's visit shows normal sinus rhythm with rate 61  bpm nonspecific T wave abnormality V3 through V6  Other past medical history reviewed Prior history of smoking for 30 years from age 37 to age  46 Previous CT scan reviewed with him from 2008 showing coronary calcifications   normal cath in 2003.  Normal stress test in 2005 at [facility].  07/2006 had Afib w/u at [facility]  [place] and on cardizem Dose decreased due to bradycardia Another normal stress test in 2007.    PMH:   has a past medical history of ASHD (arteriosclerotic heart disease); Atrial fib/flutter, transient; Colon polyps; Depression; Diverticulosis; Dyslipidemia; Hypertension; OSA (obstructive sleep apnea); Pre-diabetes; and Rectus sheath hematoma (11/21/2013).  PSH:    Past Surgical History:  Procedure Laterality Date  . HERNIA REPAIR      Current Outpatient Prescriptions  Medication Sig Dispense Refill  . aspirin EC 81 MG tablet Take 1 tablet (81 mg total) by mouth daily. 90 tablet 3  . Calcium Carbonate Antacid (TUMS E-X PO) Take 2 tablets by mouth daily.    . Cholecalciferol 5000 UNITS TABS Take 1 tablet by mouth daily.    . citalopram (CELEXA) 40 MG tablet TAKE ONE TABLET BY MOUTH ONCE DAILY. 90 tablet 0  . diltiazem (CARDIZEM) 60 MG tablet TAKE 1 TABLET BY MOUTH FOUR TIMES DAILY AS NEEDED. 120 tablet 3  . finasteride (PROSCAR) 5 MG tablet TAKE 1 TABLET BY MOUTH EVERY DAY 90 tablet 1  . LORazepam (ATIVAN) 2 MG tablet TAKE 1/2 TO 1 TABLET BY MOUTH THREE TIMES DAILY AS DIRECTED.  90 tablet 5  . Magnesium 200 MG TABS Take 1 tablet by mouth daily.    . Multiple Vitamin (MULTIVITAMIN) capsule Take 1 capsule by mouth daily.    . Omega-3 Fatty Acids (FISH OIL PO) Take 1 tablet by mouth daily.    . vitamin C (ASCORBIC ACID) 500 MG tablet Take 1,000 mg by mouth daily.     No current facility-administered medications for this visit.      Allergies:   Ciprofloxacin; Novocain [procaine hcl]; and Prednisone   Social History:  The patient  reports that he quit smoking about 23 years  ago. He has never used smokeless tobacco. He reports that he does not drink alcohol or use drugs.   Family History:   family history includes Alzheimer's disease in his mother; Breast cancer in his daughter and sister; Diabetes in his brother; Heart disease in his brother and sister; Kidney cancer in his father; Rectal cancer in his sister; Ulcerative colitis in his sister.    Review of Systems: Review of Systems  Constitutional: Negative.        Fall, bruising to left side of his face  Respiratory: Negative.   Cardiovascular: Negative.   Gastrointestinal: Negative.   Musculoskeletal: Negative.   Neurological: Negative.   Psychiatric/Behavioral: Negative.   All other systems reviewed and are negative.    PHYSICAL EXAM: VS:  BP 122/66 (BP Location: Left Arm, Patient Position: Sitting, Cuff Size: Normal)   Pulse 61   Ht 5\' 10"  (1.778 m)   Wt 199 lb 4 oz (90.4 kg)   BMI 28.59 kg/m  , BMI Body mass index is 28.59 kg/m. GEN: Well nourished, well developed, in no acute distress  HEENT: normal  Neck: no JVD, carotid bruits, or masses Cardiac: RRR; no murmurs, rubs, or gallops,no edema  Respiratory:  clear to auscultation bilaterally, normal work of breathing GI: soft, nontender, nondistended, + BS MS: no deformity or atrophy  Skin: warm and dry, no rash, Ecchymosis noted left temporal region Neuro:  Strength and sensation are intact Psych: euthymic mood, full affect    Recent Labs: 12/27/2016: ALT 11; BUN 11; Creat 0.91; Hemoglobin 15.7; Magnesium 2.1; Platelets 226; Potassium 4.3; Sodium 140; TSH 1.66    Lipid Panel Lab Results  Component Value Date   CHOL 165 12/27/2016   HDL 50 12/27/2016   LDLCALC 92 12/27/2016   TRIG 117 12/27/2016      Wt Readings from Last 3 Encounters:  01/01/17 199 lb 4 oz (90.4 kg)  12/27/16 198 lb 6.4 oz (90 kg)  09/25/16 196 lb 6.4 oz (89.1 kg)       ASSESSMENT AND PLAN:  Essential hypertension - Plan: EKG 12-Lead Blood pressure is  well controlled on today's visit. No changes made to the medications.  Mixed hyperlipidemia - Plan: EKG 12-Lead Currently not on a statin  Paroxysmal atrial fibrillation (HCC) - Plan: EKG 12-Lead Long discussion, he denies any tachycardia concerning for arrhythmia Currently on low-dose aspirin Recent fall with trauma to the left temporal region Discussed risk and benefit of anticoagulation, he prefers to keep it as it is He understands risk of stroke Recommended he monitor heart rate at home and call our office if he has elevated heart rates concerning for arrhythmia  Coronary artery disease due to lipid rich plaque - Plan: EKG 12-Lead Currently with no symptoms of angina. No further workup at this time. Continue current medication regimen.  OSA (obstructive sleep apnea) - Plan: EKG 12-Lead  Fall, subsequent encounter  Details of recent fall discussed with him, trauma to the left temporal region, appears to be healing well  Disposition:   F/U  6 months   Total encounter time more than 25 minutes  Greater than 50% was spent in counseling and coordination of care with the patient    Orders Placed This Encounter  Procedures  . EKG 12-Lead     Signed, Esmond Plants, M.D., Ph.D. 01/01/2017  [place], [place]

## 2017-01-01 ENCOUNTER — Encounter: Payer: Self-pay | Admitting: Cardiovascular Disease

## 2017-01-01 ENCOUNTER — Ambulatory Visit (INDEPENDENT_AMBULATORY_CARE_PROVIDER_SITE_OTHER): Payer: Medicare Other | Admitting: Cardiovascular Disease

## 2017-01-01 VITALS — BP 122/66 | HR 61 | Ht 70.0 in | Wt 199.2 lb

## 2017-01-01 DIAGNOSIS — I2583 Coronary atherosclerosis due to lipid rich plaque: Secondary | ICD-10-CM | POA: Diagnosis not present

## 2017-01-01 DIAGNOSIS — W19XXXD Unspecified fall, subsequent encounter: Secondary | ICD-10-CM | POA: Diagnosis not present

## 2017-01-01 DIAGNOSIS — E782 Mixed hyperlipidemia: Secondary | ICD-10-CM | POA: Diagnosis not present

## 2017-01-01 DIAGNOSIS — I2510 Atherosclerotic heart disease of native coronary artery without angina pectoris: Secondary | ICD-10-CM

## 2017-01-01 DIAGNOSIS — G4733 Obstructive sleep apnea (adult) (pediatric): Secondary | ICD-10-CM

## 2017-01-01 DIAGNOSIS — I480 Paroxysmal atrial fibrillation: Secondary | ICD-10-CM

## 2017-01-01 DIAGNOSIS — I10 Essential (primary) hypertension: Secondary | ICD-10-CM

## 2017-01-01 NOTE — Patient Instructions (Addendum)

## 2017-01-22 ENCOUNTER — Other Ambulatory Visit: Payer: Self-pay | Admitting: Physician Assistant

## 2017-04-08 ENCOUNTER — Encounter: Payer: Self-pay | Admitting: Internal Medicine

## 2017-04-08 ENCOUNTER — Ambulatory Visit (INDEPENDENT_AMBULATORY_CARE_PROVIDER_SITE_OTHER): Payer: Medicare Other | Admitting: Internal Medicine

## 2017-04-08 VITALS — BP 132/74 | HR 60 | Temp 97.7°F | Resp 16 | Ht 70.0 in | Wt 197.4 lb

## 2017-04-08 DIAGNOSIS — I2583 Coronary atherosclerosis due to lipid rich plaque: Secondary | ICD-10-CM

## 2017-04-08 DIAGNOSIS — E782 Mixed hyperlipidemia: Secondary | ICD-10-CM | POA: Diagnosis not present

## 2017-04-08 DIAGNOSIS — I2510 Atherosclerotic heart disease of native coronary artery without angina pectoris: Secondary | ICD-10-CM | POA: Diagnosis not present

## 2017-04-08 DIAGNOSIS — R7303 Prediabetes: Secondary | ICD-10-CM

## 2017-04-08 DIAGNOSIS — J40 Bronchitis, not specified as acute or chronic: Secondary | ICD-10-CM

## 2017-04-08 DIAGNOSIS — G4733 Obstructive sleep apnea (adult) (pediatric): Secondary | ICD-10-CM

## 2017-04-08 DIAGNOSIS — I482 Chronic atrial fibrillation, unspecified: Secondary | ICD-10-CM

## 2017-04-08 DIAGNOSIS — I10 Essential (primary) hypertension: Secondary | ICD-10-CM | POA: Diagnosis not present

## 2017-04-08 DIAGNOSIS — Z79899 Other long term (current) drug therapy: Secondary | ICD-10-CM | POA: Diagnosis not present

## 2017-04-08 DIAGNOSIS — E559 Vitamin D deficiency, unspecified: Secondary | ICD-10-CM | POA: Diagnosis not present

## 2017-04-08 LAB — HEPATIC FUNCTION PANEL
ALK PHOS: 62 U/L (ref 40–115)
ALT: 9 U/L (ref 9–46)
AST: 15 U/L (ref 10–35)
Albumin: 4.1 g/dL (ref 3.6–5.1)
BILIRUBIN DIRECT: 0.1 mg/dL (ref <=0.2)
BILIRUBIN INDIRECT: 0.4 mg/dL (ref 0.2–1.2)
BILIRUBIN TOTAL: 0.5 mg/dL (ref 0.2–1.2)
Total Protein: 6.5 g/dL (ref 6.1–8.1)

## 2017-04-08 LAB — BASIC METABOLIC PANEL WITH GFR
BUN: 11 mg/dL (ref 7–25)
CO2: 26 mmol/L (ref 20–31)
Calcium: 10 mg/dL (ref 8.6–10.3)
Chloride: 102 mmol/L (ref 98–110)
Creat: 0.87 mg/dL (ref 0.70–1.11)
GFR, EST AFRICAN AMERICAN: 89 mL/min (ref >=60)
GFR, EST NON AFRICAN AMERICAN: 77 mL/min (ref >=60)
Glucose, Bld: 59 mg/dL — ABNORMAL LOW (ref 65–99)
POTASSIUM: 4.2 mmol/L (ref 3.5–5.3)
Sodium: 139 mmol/L (ref 135–146)

## 2017-04-08 LAB — CBC WITH DIFFERENTIAL/PLATELET
BASOS PCT: 0 %
Basophils Absolute: 0 cells/uL (ref 0–200)
Eosinophils Absolute: 364 cells/uL (ref 15–500)
Eosinophils Relative: 4 %
HCT: 47.9 % (ref 38.5–50.0)
Hemoglobin: 16.1 g/dL (ref 13.2–17.1)
LYMPHS PCT: 29 %
Lymphs Abs: 2639 cells/uL (ref 850–3900)
MCH: 31.5 pg (ref 27.0–33.0)
MCHC: 33.6 g/dL (ref 32.0–36.0)
MCV: 93.7 fL (ref 80.0–100.0)
MONO ABS: 910 {cells}/uL (ref 200–950)
MONOS PCT: 10 %
MPV: 8.8 fL (ref 7.5–12.5)
NEUTROS ABS: 5187 {cells}/uL (ref 1500–7800)
Neutrophils Relative %: 57 %
PLATELETS: 242 10*3/uL (ref 140–400)
RBC: 5.11 MIL/uL (ref 4.20–5.80)
RDW: 13.4 % (ref 11.0–15.0)
WBC: 9.1 10*3/uL (ref 3.8–10.8)

## 2017-04-08 LAB — LIPID PANEL
CHOL/HDL RATIO: 4.3 ratio (ref <5.0)
CHOLESTEROL: 207 mg/dL — AB (ref <200)
HDL: 48 mg/dL (ref >40)
LDL Cholesterol: 122 mg/dL — ABNORMAL HIGH (ref <100)
Triglycerides: 185 mg/dL — ABNORMAL HIGH (ref <150)
VLDL: 37 mg/dL — ABNORMAL HIGH (ref <30)

## 2017-04-08 LAB — TSH: TSH: 1.94 m[IU]/L (ref 0.40–4.50)

## 2017-04-08 MED ORDER — AZITHROMYCIN 250 MG PO TABS: ORAL_TABLET | ORAL | 1 refills | Status: DC

## 2017-04-08 MED ORDER — DEXAMETHASONE 0.5 MG PO TABS: ORAL_TABLET | ORAL | 0 refills | Status: DC

## 2017-04-08 NOTE — Patient Instructions (Signed)

## 2017-04-08 NOTE — Progress Notes (Signed)
This very nice 83 y.o. MWM presents for 6 month follow up with Hypertension, Hyperlipidemia, Pre-Diabetes and Vitamin D Deficiency.  He also related a 1 week hx of head and chest congestion, hoarseness and a productive cough w/o fever, chills/rigors, sweats, rash or dyspnea.      Patient is treated for HTN (2005) & BP has been controlled at home. Today's BP is at goal - 132/74. Patient has hx/o cAfib tx'd w/Coumnadin til stopped for a spontaneous large rectus sheath hematoma and since is only taking low dose bASA.  He had a Negative Ht Cath in 2003 and negative Cardiolite in2007. Patient has had no complaints of any cardiac type chest pain, palpitations, dyspnea/orthopnea/PND, dizziness, claudication, or dependent edema.     Hyperlipidemia is controlled with diet.  Last Lipids were at goal: Lab Results  Component Value Date   CHOL 165 12/27/2016   HDL 50 12/27/2016   LDLCALC 92 12/27/2016   TRIG 117 12/27/2016   CHOLHDL 3.3 12/27/2016      Also, the patient has history of PreDiabetes ( (A1c 5.7% in 2014) and has had no symptoms of reactive hypoglycemia, diabetic polys, paresthesias or visual blurring.  Last A1c was at goal: Lab Results  Component Value Date   HGBA1C 5.3 12/27/2016      Further, the patient also has history of Vitamin D Deficiency ("33" in 2008) and supplements vitamin D without any suspected side-effects. Last vitamin D was at goal:   Lab Results  Component Value Date   VD25OH 67 12/27/2016   Current Outpatient Prescriptions on File Prior to Visit  Medication Sig  . aspirin EC 81 MG tablet Take 1 tablet (81 mg total) by mouth daily.  . Calcium Carbonate Antacid (TUMS E-X PO) Take 2 tablets by mouth daily.  . Cholecalciferol 5000 UNITS TABS Take 1 tablet by mouth daily.  . citalopram (CELEXA) 40 MG tablet TAKE 1 TABLET BY MOUTH EVERY DAY  . diltiazem (CARDIZEM) 60 MG tablet TAKE 1 TABLET BY MOUTH FOUR TIMES DAILY AS NEEDED.  . finasteride (PROSCAR) 5 MG tablet  TAKE 1 TABLET BY MOUTH EVERY DAY  . LORazepam (ATIVAN) 2 MG tablet TAKE 1/2 TO 1 TABLET BY MOUTH THREE TIMES DAILY AS DIRECTED.  . Magnesium 200 MG TABS Take 1 tablet by mouth daily.  . Multiple Vitamin (MULTIVITAMIN) capsule Take 1 capsule by mouth daily.  . Omega-3 Fatty Acids (FISH OIL PO) Take 1 tablet by mouth daily.  . vitamin C (ASCORBIC ACID) 500 MG tablet Take 1,000 mg by mouth daily.   No current facility-administered medications on file prior to visit.    Allergies  Allergen Reactions  . Ciprofloxacin     dysphoria  . Novocain [Procaine Hcl]   . Prednisone     Nervousness   PMHx:   Past Medical History:  Diagnosis Date  . ASHD (arteriosclerotic heart disease)   . Atrial fib/flutter, transient    off coumadin since 11/2013- CHADSVAC of 2  . Colon polyps    2003 hyperplastic and tubuler adenoma  . Depression   . Diverticulosis   . Dyslipidemia   . Hypertension   . OSA (obstructive sleep apnea)   . Pre-diabetes   . Rectus sheath hematoma 11/21/2013   Dr. Deatra Ina    Immunization History  Administered Date(s) Administered  . DT 07/06/2014  . Influenza Split 07/20/2014, 07/18/2016  . Influenza, High Dose Seasonal PF 08/22/2015  . Pneumococcal Conjugate-13 07/06/2014  . Pneumococcal Polysaccharide-23 06/09/2013  .  Td 12/15/2016  . Zoster 10/15/2005   Past Surgical History:  Procedure Laterality Date  . HERNIA REPAIR     FHx:    Reviewed / unchanged  SHx:    Reviewed / unchanged  Systems Review:  Constitutional: Denies fever, chills, wt changes, headaches, insomnia, fatigue, night sweats, change in appetite. Eyes: Denies redness, blurred vision, diplopia, discharge, itchy, watery eyes.  ENT: Denies discharge, congestion, post nasal drip, epistaxis, sore throat, earache, hearing loss, dental pain, tinnitus, vertigo, sinus pain, snoring.  CV: Denies chest pain, palpitations, irregular heartbeat, syncope, dyspnea, diaphoresis, orthopnea, PND, claudication or  edema. Respiratory: denies cough, dyspnea, DOE, pleurisy, hoarseness, laryngitis, wheezing.  Gastrointestinal: Denies dysphagia, odynophagia, heartburn, reflux, water brash, abdominal pain or cramps, nausea, vomiting, bloating, diarrhea, constipation, hematemesis, melena, hematochezia  or hemorrhoids. Genitourinary: Denies dysuria, frequency, urgency, nocturia, hesitancy, discharge, hematuria or flank pain. Musculoskeletal: Denies arthralgias, myalgias, stiffness, jt. swelling, pain, limping or strain/sprain.  Skin: Denies pruritus, rash, hives, warts, acne, eczema or change in skin lesion(s). Neuro: No weakness, tremor, incoordination, spasms, paresthesia or pain. Psychiatric: Denies confusion, memory loss or sensory loss. Endo: Denies change in weight, skin or hair change.  Heme/Lymph: No excessive bleeding, bruising or enlarged lymph nodes.  Physical Exam  BP 132/74   Pulse 60   Temp 97.7 F (36.5 C)   Resp 16   Ht 5\' 10"  (1.778 m)   Wt 197 lb 6.4 oz (89.5 kg)   BMI 28.32 kg/m   Appears well nourished, well groomed  and in no distress.  Eyes: PERRLA, EOMs, conjunctiva no swelling or erythema. Sinuses: No frontal/maxillary tenderness ENT/Mouth: EAC's clear, TM's nl w/o erythema, bulging. Nares clear w/o erythema, swelling, exudates. Oropharynx clear without erythema or exudates. Oral hygiene is good. Tongue normal, non obstructing. Hearing intact.  Neck: Supple. Thyroid nl. Car 2+/2+ without bruits, nodes or JVD. Chest: Respirations nl & equal w/ few scattered  Rales and rhonchi and no wheezing or stridor.  Cor: Heart sounds soft w/ sl irregular rate and rhythm without sig. Murmurs. Peripheral pulses normal and equal  without edema.  Abdomen: Soft & bowel sounds normal. Non-tender w/o guarding, rebound, hernias, masses or organomegaly.  Lymphatics: Unremarkable.  Musculoskeletal: Full ROM all peripheral extremities, joint stability, 5/5 strength and normal gait.  Skin: Warm, dry  without exposed rashes, lesions or ecchymosis apparent.  Neuro: Cranial nerves intact, reflexes equal bilaterally. Sensory-motor testing grossly intact. Tendon reflexes grossly intact.  Pysch: Alert & oriented x 3.  Insight and judgement nl & appropriate. No ideations.  Assessment and Plan:   1. Essential hypertension  - Continue medication, monitor blood pressure at home.  - Continue DASH diet. Reminder to go to the ER if any CP,  SOB, nausea, dizziness, severe HA, changes vision/speech.  - CBC with Differential/Platelet - BASIC METABOLIC PANEL WITH GFR - Magnesium - TSH  2. Hyperlipidemia, mixed  - Continue diet/meds, exercise,& lifestyle modifications.  - Continue monitor periodic cholesterol/liver & renal functions   - Hepatic function panel - Lipid panel - TSH  3. Pre-diabetes  - Continue diet, exercise, lifestyle modifications.  - Monitor appropriate labs.  - Hemoglobin A1c - Insulin, random  4. Vitamin D deficiency  - Continue supplementation. - VITAMIN D 25 Hydroxy   5. Coronary artery disease due to lipid rich plaque  - Lipid panel  6. Chronic atrial fibrillation (HCC)  - TSH  7. OSA (obstructive sleep apnea)   8. Medication management  - CBC with Differential/Platelet - BASIC METABOLIC PANEL  WITH GFR - Hepatic function panel - Magnesium - Lipid panel - TSH - Hemoglobin A1c - Insulin, random - VITAMIN D 25 Hydroxy  9. Bronchitis  - azithromycin (ZITHROMAX) 250 MG tablet; Take 2 tablets (500 mg) on  Day 1,  followed by 1 tablet (250 mg) once daily on Days 2 through 5.  Dispense: 6 each; Refill: 1 - dexamethasone (DECADRON) 0.5 MG tablet; Take 1 tab 3 x day - 3 days, then 2 x day - 3 days, then 1 tab daily  Dispense: 20 tablet; Refill: 0        Discussed  regular exercise, BP monitoring, weight control to achieve/maintain BMI less than 25 and discussed med and SE's. Recommended labs to assess and monitor clinical status with further  disposition pending results of labs. Over 30 minutes of exam, counseling, chart review was performed.

## 2017-04-09 LAB — MAGNESIUM: Magnesium: 2.1 mg/dL (ref 1.5–2.5)

## 2017-04-09 LAB — HEMOGLOBIN A1C
HEMOGLOBIN A1C: 5.5 % (ref <5.7)
MEAN PLASMA GLUCOSE: 111 mg/dL

## 2017-04-09 LAB — INSULIN, RANDOM: INSULIN: 9.4 u[IU]/mL (ref 2.0–19.6)

## 2017-04-09 LAB — VITAMIN D 25 HYDROXY (VIT D DEFICIENCY, FRACTURES): VIT D 25 HYDROXY: 61 ng/mL (ref 30–100)

## 2017-04-15 ENCOUNTER — Telehealth: Payer: Self-pay | Admitting: *Deleted

## 2017-04-15 NOTE — Telephone Encounter (Signed)
Patient called regarding his increase in his cholesterol result from 157 to 207 at his last lab.  He was told to stop his Simvastatin and asked if he should restart since his level increased.  Per Dr Melford Aase, it is no longer advised to continue cholesterol medications above age 81.  The patient is aware.

## 2017-04-21 ENCOUNTER — Other Ambulatory Visit: Payer: Self-pay | Admitting: Internal Medicine

## 2017-05-26 ENCOUNTER — Other Ambulatory Visit: Payer: Self-pay | Admitting: Internal Medicine

## 2017-05-26 DIAGNOSIS — F411 Generalized anxiety disorder: Secondary | ICD-10-CM

## 2017-05-26 NOTE — Telephone Encounter (Signed)
Please call Lorazepam 

## 2017-06-19 ENCOUNTER — Ambulatory Visit (INDEPENDENT_AMBULATORY_CARE_PROVIDER_SITE_OTHER): Payer: Medicare Other | Admitting: Internal Medicine

## 2017-06-19 VITALS — BP 118/72 | HR 64 | Temp 97.3°F | Wt 196.6 lb

## 2017-06-19 DIAGNOSIS — J40 Bronchitis, not specified as acute or chronic: Secondary | ICD-10-CM

## 2017-06-19 DIAGNOSIS — M25551 Pain in right hip: Secondary | ICD-10-CM | POA: Diagnosis not present

## 2017-06-19 DIAGNOSIS — M5441 Lumbago with sciatica, right side: Secondary | ICD-10-CM

## 2017-06-19 MED ORDER — LEVOFLOXACIN 500 MG PO TABS: 500.0000 mg | ORAL_TABLET | Freq: Every day | ORAL | 0 refills | Status: AC

## 2017-06-19 MED ORDER — DEXAMETHASONE 1 MG PO TABS: ORAL_TABLET | ORAL | 0 refills | Status: AC

## 2017-06-19 MED ORDER — TRAMADOL HCL 50 MG PO TABS: ORAL_TABLET | ORAL | 0 refills | Status: DC

## 2017-06-22 ENCOUNTER — Encounter: Payer: Self-pay | Admitting: Internal Medicine

## 2017-06-22 NOTE — Progress Notes (Signed)
  Subjective:    Patient ID: [name] Yu, male    DOB: 1927-03-02, 83 y.o.   MRN: [id number]  HPI  Patient presents with c/o of a productive cough persisting since treated in late June with a Z-pak. Occasionally expectorates a yellowish green sputum. Denies fever, chills, sweats, rash or dyspnea.    He also c/o 2-3  week hx/o Rt hip & leg pains. Denies injury.   Medication Sig  . aspirin EC 81 MG Take 1 tablet (81 mg total) by mouth daily.  . TUMS E-X Take 2 tablets by mouth daily.  . Vit D 5000 UNITS Take 1 tablet by mouth daily.  . citalopram 40 MG  TAKE 1 TABLET BY MOUTH EVERY DAY  . diltiazem  60 MG TAKE 1 TABLET BY MOUTH FOUR TIMES DAILY AS NEEDED.  . finasteride 5 MG  TAKE 1 TABLET BY MOUTH EVERY DAY  . LORazepam  2 MG TAKE 1/2-TAB 3 x / DAILY AS NEEDED  . Magnesium 200 MG  Take 1 tablet by mouth daily.  . Multiple Vitamin  Take 1 capsule by mouth daily.  . Omega-3 FISH OIL Take 1 tablet by mouth daily.  . vitamin C  500 MG  Take 1,000 mg by mouth daily.   Allergies  Allergen Reactions  . Ciprofloxacin     dysphoria  . Novocain [Procaine Hcl]   . Prednisone     Nervousness   Past Medical History:  Diagnosis Date  . ASHD (arteriosclerotic heart disease)   . Atrial fib/flutter, transient    off coumadin since 11/2013- CHADSVAC of 2  . Colon polyps    2003 hyperplastic and tubuler adenoma  . Depression   . Diverticulosis   . Dyslipidemia   . Hypertension   . OSA (obstructive sleep apnea)   . Pre-diabetes   . Rectus sheath hematoma 11/21/2013   Dr. Deatra Ina    Review of Systems  10 point systems review negative except as above.    Objective:   Physical Exam  BP 118/72   Pulse 64   Temp (!) 97.3 F (36.3 C)   Wt 196 lb 9.6 oz (89.2 kg)   BMI 28.21 kg/m   No Stridor. Appears in Moderate discomfort. No rash, cyanosis.   HEENT - WNL. Neck - supple.  Chest - Few scattered rales / rhonchi not clearing completely. Cor - Nl HS. RRR w/o sig MGR. PP 1(+). No  edema. MS- FROM w/o deformities.  Gait antalgic with limp favoring the RLE.(+/_) SLR Rt>Tt. Painful internal/external Rt hip rotation.  Neuro -  Nl w/o focal abnormalities.    Assessment & Plan:   1. Acute back pain with sciatica, right  - dexamethasone (DECADRON) 1 MG tablet; Take 1 tab 3 x day - 3 days, then 2 x day - 3 days, then 1 tab daily  Dispense: 20 tablet  - traMADol (ULTRAM) 50 MG tablet; Take 1/2 to 1 tablet every 4 hrs if needed for pain  Dispense: 30 tablet;  2. Right hip pain  - dexamethasone (DECADRON) 1 MG tablet; Take 1 tab 3 x day - 3 days, then 2 x day - 3 days, then 1 tab daily  Dispense: 20 tablet  - traMADol (ULTRAM) 50 MG tablet; Take 1/2 to 1 tablet every 4 hrs if needed for pain  Dispense: 30 tablet  3. Bronchitis  - levofloxacin (LEVAQUIN) 500 MG tablet; Take 1 tablet (500 mg total) by mouth daily.  Dispense: 10 tablet;

## 2017-06-23 ENCOUNTER — Other Ambulatory Visit: Payer: Self-pay | Admitting: Cardiovascular Disease

## 2017-06-24 DIAGNOSIS — Z5181 Encounter for therapeutic drug level monitoring: Secondary | ICD-10-CM | POA: Diagnosis not present

## 2017-06-24 DIAGNOSIS — M549 Dorsalgia, unspecified: Secondary | ICD-10-CM | POA: Diagnosis not present

## 2017-06-24 DIAGNOSIS — M5431 Sciatica, right side: Secondary | ICD-10-CM | POA: Diagnosis not present

## 2017-06-24 DIAGNOSIS — M5441 Lumbago with sciatica, right side: Secondary | ICD-10-CM | POA: Diagnosis not present

## 2017-06-24 DIAGNOSIS — M79609 Pain in unspecified limb: Secondary | ICD-10-CM | POA: Diagnosis not present

## 2017-06-24 DIAGNOSIS — Z87891 Personal history of nicotine dependence: Secondary | ICD-10-CM | POA: Diagnosis not present

## 2017-06-24 DIAGNOSIS — M4316 Spondylolisthesis, lumbar region: Secondary | ICD-10-CM | POA: Diagnosis not present

## 2017-06-24 DIAGNOSIS — M545 Low back pain: Secondary | ICD-10-CM | POA: Diagnosis not present

## 2017-06-26 ENCOUNTER — Other Ambulatory Visit: Payer: Self-pay | Admitting: Internal Medicine

## 2017-06-26 DIAGNOSIS — M549 Dorsalgia, unspecified: Secondary | ICD-10-CM

## 2017-06-26 DIAGNOSIS — M5430 Sciatica, unspecified side: Secondary | ICD-10-CM

## 2017-07-01 DIAGNOSIS — M545 Low back pain: Secondary | ICD-10-CM | POA: Diagnosis not present

## 2017-07-01 DIAGNOSIS — M25551 Pain in right hip: Secondary | ICD-10-CM | POA: Diagnosis not present

## 2017-07-01 DIAGNOSIS — Z87891 Personal history of nicotine dependence: Secondary | ICD-10-CM | POA: Diagnosis not present

## 2017-07-01 DIAGNOSIS — M79604 Pain in right leg: Secondary | ICD-10-CM | POA: Diagnosis not present

## 2017-07-01 DIAGNOSIS — M1611 Unilateral primary osteoarthritis, right hip: Secondary | ICD-10-CM | POA: Diagnosis not present

## 2017-07-15 ENCOUNTER — Ambulatory Visit (INDEPENDENT_AMBULATORY_CARE_PROVIDER_SITE_OTHER): Payer: Medicare Other | Admitting: Physician Assistant

## 2017-07-15 ENCOUNTER — Encounter: Payer: Self-pay | Admitting: Physician Assistant

## 2017-07-15 VITALS — BP 126/80 | HR 78 | Temp 97.5°F | Ht 70.0 in | Wt 189.4 lb

## 2017-07-15 DIAGNOSIS — Z79899 Other long term (current) drug therapy: Secondary | ICD-10-CM

## 2017-07-15 DIAGNOSIS — Z23 Encounter for immunization: Secondary | ICD-10-CM

## 2017-07-15 DIAGNOSIS — I482 Chronic atrial fibrillation, unspecified: Secondary | ICD-10-CM

## 2017-07-15 DIAGNOSIS — M5441 Lumbago with sciatica, right side: Secondary | ICD-10-CM | POA: Diagnosis not present

## 2017-07-15 DIAGNOSIS — E782 Mixed hyperlipidemia: Secondary | ICD-10-CM | POA: Diagnosis not present

## 2017-07-15 DIAGNOSIS — F325 Major depressive disorder, single episode, in full remission: Secondary | ICD-10-CM | POA: Diagnosis not present

## 2017-07-15 DIAGNOSIS — I10 Essential (primary) hypertension: Secondary | ICD-10-CM | POA: Diagnosis not present

## 2017-07-15 DIAGNOSIS — I2510 Atherosclerotic heart disease of native coronary artery without angina pectoris: Secondary | ICD-10-CM

## 2017-07-15 NOTE — Patient Instructions (Addendum)
Get on gabapentin 100mg  at night, can increase to 2 at night after 3-4 nights if he is still having pain No flexeril Can do ibuprofen  Please pick one of the over the counter allergy medications below and take it once daily for allergies.  Claritin or loratadine cheapest but likely the weakest  Zyrtec or certizine at night because it can make you sleepy The strongest is allegra or fexafinadine  Cheapest at [employer], [employer], [employer]  Common causes of cough OR hoarseness OR sore throat:   Allergies, Viral Infections, Acid Reflux and Bacterial Infections.   Allergies and viral infections cause a cough OR sore throat by post nasal drip and are often worse at night, can also have sneezing, lower grade fevers, clear/yellow mucus. This is best treated with allergy medications or nasal sprays.  Please get on allegra for 1-2 weeks The strongest is allegra or fexafinadine  Cheapest at [employer], [employer], [employer]   Bacterial infections are more severe than allergies or viral infections with fever, teeth pain, fatigue. This can be treated with prednisone and the same over the counter medication and after 7 days can be treated with an antibiotic.   Silent reflux/GERD can cause a cough OR sore throat OR hoarseness WITHOUT heart burn because the esophagus that goes to the stomach and trachea that goes to the lungs are very close and when you lay down the acid can irritate your throat and lungs. This can cause hoarseness, cough, and wheezing. Please stop any alcohol or anti-inflammatories like aleve/advil/ibuprofen and start an over the counter Prilosec or omeprazole 1-2 times daily 20mins before food for 2 weeks, then switch to over the counter zantac/ratinidine or pepcid/famotadine once at night for 2 weeks.    sometimes irritation causes more irritation. Try voice rest, use sugar free cough drops to prevent coughing, and try to stop clearing your throat.   If you ever have a cough that does not go away after  trying these things please make a follow up visit for further evaluation or we can refer you to a specialist. Or if you ever have shortness of breath or chest pain go to the ER.  '   Go to the ER if you have any new weakness in your legs, have trouble controlling your urine or bowels, or have worsening pain.     Back pain Rehab Ask your health care provider which exercises are safe for you. Do exercises exactly as told by your health care provider and adjust them as directed. It is normal to feel mild stretching, pulling, tightness, or discomfort as you do these exercises, but you should stop right away if you feel sudden pain or your pain gets worse.Do not begin these exercises until told by your health care provider. Stretching and range of motion exercises These exercises warm up your muscles and joints and improve the movement and flexibility of your hips and your back. These exercises also help to relieve pain, numbness, and tingling. Exercise A: Sciatic nerve glide 1. Sit in a chair with your head facing down toward your chest. Place your hands behind your back. Let your shoulders slump forward. 2. Slowly straighten one of your knees while you tilt your head back as if you are looking toward the ceiling. Only straighten your leg as far as you can without making your symptoms worse. 3. Hold for __________ seconds. 4. Slowly return to the starting position. 5. Repeat with your other leg. Repeat __________ times. Complete this exercise __________ times  a day. Exercise B: Knee to chest with hip adduction and internal rotation  1. Lie on your back on a firm surface with both legs straight. 2. Bend one of your knees and move it up toward your chest until you feel a gentle stretch in your lower back and buttock. Then, move your knee toward the shoulder that is on the opposite side from your leg. ? Hold your leg in this position by holding onto the front of your knee. 3. Hold for __________  seconds. 4. Slowly return to the starting position. 5. Repeat with your other leg. Repeat __________ times. Complete this exercise __________ times a day. Exercise C: Prone extension on elbows  1. Lie on your abdomen on a firm surface. A bed may be too soft for this exercise. 2. Prop yourself up on your elbows. 3. Use your arms to help lift your chest up until you feel a gentle stretch in your abdomen and your lower back. ? This will place some of your body weight on your elbows. If this is uncomfortable, try stacking pillows under your chest. ? Your hips should stay down, against the surface that you are lying on. Keep your hip and back muscles relaxed. 4. Hold for __________ seconds. 5. Slowly relax your upper body and return to the starting position. Repeat __________ times. Complete this exercise __________ times a day. Strengthening exercises These exercises build strength and endurance in your back. Endurance is the ability to use your muscles for a long time, even after they get tired. Exercise D: Pelvic tilt 1. Lie on your back on a firm surface. Bend your knees and keep your feet flat. 2. Tense your abdominal muscles. Tip your pelvis up toward the ceiling and flatten your lower back into the floor. ? To help with this exercise, you may place a small towel under your lower back and try to push your back into the towel. 3. Hold for __________ seconds. 4. Let your muscles relax completely before you repeat this exercise. Repeat __________ times. Complete this exercise __________ times a day. Exercise E: Alternating arm and leg raises  1. Get on your hands and knees on a firm surface. If you are on a hard floor, you may want to use padding to cushion your knees, such as an exercise mat. 2. Line up your arms and legs. Your hands should be below your shoulders, and your knees should be below your hips. 3. Lift your left leg behind you. At the same time, raise your right arm and  straighten it in front of you. ? Do not lift your leg higher than your hip. ? Do not lift your arm higher than your shoulder. ? Keep your abdominal and back muscles tight. ? Keep your hips facing the ground. ? Do not arch your back. ? Keep your balance carefully, and do not hold your breath. 4. Hold for __________ seconds. 5. Slowly return to the starting position and repeat with your right leg and your left arm. Repeat __________ times. Complete this exercise __________ times a day. Posture and body mechanics  Body mechanics refers to the movements and positions of your body while you do your daily activities. Posture is part of body mechanics. Good posture and healthy body mechanics can help to relieve stress in your body's tissues and joints. Good posture means that your spine is in its natural S-curve position (your spine is neutral), your shoulders are pulled back slightly, and your head is not tipped  forward. The following are general guidelines for applying improved posture and body mechanics to your everyday activities. Standing   When standing, keep your spine neutral and your feet about hip-width apart. Keep a slight bend in your knees. Your ears, shoulders, and hips should line up.  When you do a task in which you stand in one place for a long time, place one foot up on a stable object that is 2-4 inches (5-10 cm) high, such as a footstool. This helps keep your spine neutral. Sitting   When sitting, keep your spine neutral and keep your feet flat on the floor. Use a footrest, if necessary, and keep your thighs parallel to the floor. Avoid rounding your shoulders, and avoid tilting your head forward.  When working at a desk or a computer, keep your desk at a height where your hands are slightly lower than your elbows. Slide your chair under your desk so you are close enough to maintain good posture.  When working at a computer, place your monitor at a height where you are looking  straight ahead and you do not have to tilt your head forward or downward to look at the screen. Resting   When lying down and resting, avoid positions that are most painful for you.  If you have pain with activities such as sitting, bending, stooping, or squatting (flexion-based activities), lie in a position in which your body does not bend very much. For example, avoid curling up on your side with your arms and knees near your chest (fetal position).  If you have pain with activities such as standing for a long time or reaching with your arms (extension-based activities), lie with your spine in a neutral position and bend your knees slightly. Try the following positions: ? Lying on your side with a pillow between your knees. ? Lying on your back with a pillow under your knees. Lifting   When lifting objects, keep your feet at least shoulder-width apart and tighten your abdominal muscles.  Bend your knees and hips and keep your spine neutral. It is important to lift using the strength of your legs, not your back. Do not lock your knees straight out.  Always ask for help to lift heavy or awkward objects. This information is not intended to replace advice given to you by your health care provider. Make sure you discuss any questions you have with your health care provider. Document Released: 10/01/2005 Document Revised: 06/07/2016 Document Reviewed: 06/17/2015 Elsevier Interactive Patient Education  [employer].

## 2017-07-15 NOTE — Progress Notes (Signed)
Assessment and Plan:  Needs flu shot -     Flu vaccine HIGH DOSE PF  Acute back pain with sciatica, right ? Some hip component as well, neg straight leg and no red flag symptoms but patient is not tolerating oral meds well Will try to get into ortho sooner for injection/evaluation Stop flexeril too sedating, continue iburprofen and slowly titrate up on gabappentin 100mg  got from [place] hypertension - continue medications, DASH diet, exercise and monitor at home. Call if greater than 130/80.  -     CBC with Differential/Platelet -     BASIC METABOLIC PANEL WITH GFR -     Hepatic function panel -     TSH  Hyperlipidemia, mixed -continue medications, check lipids, decrease fatty foods, increase activity.  -     Lipid panel  Medication management -     Magnesium  Chronic atrial fibrillation (HCC) No on anticoag, understands risk stroke/death  ASHD (arteriosclerotic heart disease) Control blood pressure, cholesterol, glucose, increase exercise.   Depression, major, in remission ([place]) Continue meds  Continue diet and meds as discussed. Further disposition pending results of labs.  HPI 83 y.o. male  presents for 3 month follow up with hypertension, hyperlipidemia, prediabetes and vitamin D.  Has a canker sore, using the lidocaine jelly at this time.    His blood pressure has been controlled at home, today their BP is BP: 126/80.   He does not workout. He denies chest pain, shortness of breath, dizziness.  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 Negative stress test 2007.  He is on celexa at night.  Daughter and wife is here with him.  He is complaining of some right sided back pain x 1-2 months, no injury, treated with prednisone 09/05, went to the ER 06/24/2017 given flexeril and lidocaine patches and seen again 07/01/2017. Xray showed severe DJD of right hip. And showed mild multilevel disc space height loss  and mild retrolisthesis of L3-L4.  He has pain right lateral hip all the way down to his toe, worse lateral front leg.  Has appointment Oct 19th with [facility]. Flexeril is 10 and causes issues, the gabapentin, and ibuprofen 600mg .   He is on cholesterol medication and denies myalgias. His cholesterol is at goal. The cholesterol last visit was:   Lab Results  Component Value Date   CHOL 207 (H) 04/08/2017   HDL 48 04/08/2017   LDLCALC 122 (H) 04/08/2017   TRIG 185 (H) 04/08/2017   CHOLHDL 4.3 04/08/2017    He has been working on diet and exercise for prediabetes, and denies foot ulcerations, hyperglycemia, hypoglycemia , increased appetite, nausea, paresthesia of the feet, polydipsia, polyuria, visual disturbances, vomiting and weight loss. Last A1C in the office was:  Lab Results  Component Value Date   HGBA1C 5.5 04/08/2017   Patient is on Vitamin D supplement.  Lab Results  Component Value Date   VD25OH 61 04/08/2017     BMI is Body mass index is 27.18 kg/m., he is working on diet and exercise. Wt Readings from Last 3 Encounters:  07/15/17 189 lb 6.4 oz (85.9 kg)  06/19/17 196 lb 9.6 oz (89.2 kg)  04/08/17 197 lb 6.4 oz (89.5 kg)     Current Medications:  Current Outpatient Prescriptions on File Prior to Visit  Medication Sig Dispense Refill  . aspirin EC 81 MG tablet Take 1 tablet (81 mg total) by mouth daily. [place]  tablet 3  . Calcium Carbonate Antacid (TUMS E-X PO) Take 2 tablets by mouth daily.    . Cholecalciferol 5000 UNITS TABS Take 1 tablet by mouth daily.    . citalopram (CELEXA) 40 MG tablet TAKE 1 TABLET BY MOUTH EVERY DAY 90 tablet 1  . diltiazem (CARDIZEM) 60 MG tablet TAKE 1 TABLET BY MOUTH FOUR TIMES DAILY AS NEEDED. 120 tablet 2  . finasteride (PROSCAR) 5 MG tablet TAKE 1 TABLET BY MOUTH EVERY DAY 90 tablet 3  . LORazepam (ATIVAN) 2 MG tablet TAKE 1/2 TO 1 TABLET BY MOUTH THREE TIMES DAILY AS NEEDED AND AS DIRECTED 90 tablet 0  . Magnesium 200 MG TABS  Take 1 tablet by mouth daily.    . Multiple Vitamin (MULTIVITAMIN) capsule Take 1 capsule by mouth daily.    . Omega-3 Fatty Acids (FISH OIL PO) Take 1 tablet by mouth daily.    . traMADol (ULTRAM) 50 MG tablet Take 1/2 to 1 tablet every 4 hrs if needed for pain 30 tablet 0  . vitamin C (ASCORBIC ACID) 500 MG tablet Take 1,000 mg by mouth daily.     No current facility-administered medications on file prior to visit.     Medical History:  Past Medical History:  Diagnosis Date  . ASHD (arteriosclerotic heart disease)   . Atrial fib/flutter, transient    off coumadin since 11/2013- CHADSVAC of 2  . Colon polyps    2003 hyperplastic and tubuler adenoma  . Depression   . Diverticulosis   . Dyslipidemia   . Hypertension   . OSA (obstructive sleep apnea)   . Pre-diabetes   . Rectus sheath hematoma 11/21/2013   Dr. Deatra Ina     Allergies:  Allergies  Allergen Reactions  . Ciprofloxacin     dysphoria  . Novocain [Procaine Hcl]   . Prednisone     Nervousness     Review of Systems:  Review of Systems  Constitutional: Negative for chills and fever.  HENT: Negative for congestion, ear pain and sore throat.   Eyes: Negative.   Respiratory: Negative for cough, shortness of breath and wheezing.   Cardiovascular: Negative for chest pain, palpitations and leg swelling.  Gastrointestinal: Negative for blood in stool, constipation, diarrhea, heartburn, melena, nausea and vomiting.  Genitourinary: Negative.   Musculoskeletal: Positive for back pain, joint pain and myalgias. Negative for falls and neck pain.  Skin: Negative.   Neurological: Negative for dizziness and headaches.  Psychiatric/Behavioral: Negative for depression. The patient is nervous/anxious. The patient does not have insomnia.     Family history- Review and unchanged  Social history- Review and unchanged  Physical Exam: BP 126/80   Pulse 78   Temp (!) 97.5 F (36.4 C)   Ht 5\' 10"  (1.778 m)   Wt 189 lb 6.4 oz  (85.9 kg)   SpO2 98%   BMI 27.18 kg/m  Wt Readings from Last 3 Encounters:  07/15/17 189 lb 6.4 oz (85.9 kg)  06/19/17 196 lb 9.6 oz (89.2 kg)  04/08/17 197 lb 6.4 oz (89.5 kg)    General Appearance: Well nourished well developed, in no apparent distress. Eyes: PERRLA, EOMs, conjunctiva no swelling or erythema ENT/Mouth: Ear canals normal without obstruction, swelling, erythma, discharge.  TMs normal bilaterally.  Oropharynx moist, clear, without exudate, or postoropharyngeal swelling. Neck: Supple, thyroid normal,no cervical adenopathy  Respiratory: Respiratory effort normal, Breath sounds clear A&P without rhonchi, wheeze, or rale.  No retractions, no accessory usage. Cardio: RRR with no MRGs. Brisk  peripheral pulses without edema.  Abdomen: Soft, + BS,  Non tender, no guarding, rebound, hernias, masses. Musculoskeletal: Full ROM, 4/5 strength, patient in wheelchair, negative straight leg, pain with movement of right hip.  Skin: Warm, dry without rashes, lesions, ecchymosis.  Neuro: Awake and oriented X 3, Cranial nerves intact. Normal muscle tone, no cerebellar symptoms. Psych: Normal affect, Insight and Judgment appropriate.    Vicie Mutters, PA-C 2:51 PM [facility]

## 2017-07-16 ENCOUNTER — Ambulatory Visit (INDEPENDENT_AMBULATORY_CARE_PROVIDER_SITE_OTHER): Payer: Medicare Other

## 2017-07-16 ENCOUNTER — Encounter (INDEPENDENT_AMBULATORY_CARE_PROVIDER_SITE_OTHER): Payer: Self-pay | Admitting: Orthopaedic Surgery

## 2017-07-16 ENCOUNTER — Ambulatory Visit (INDEPENDENT_AMBULATORY_CARE_PROVIDER_SITE_OTHER): Payer: Medicare Other | Admitting: Orthopaedic Surgery

## 2017-07-16 VITALS — BP 112/74 | HR 88 | Temp 97.2°F | Ht 70.0 in | Wt 189.0 lb

## 2017-07-16 DIAGNOSIS — M545 Low back pain: Secondary | ICD-10-CM

## 2017-07-16 DIAGNOSIS — M4810 Ankylosing hyperostosis [Forestier], site unspecified: Secondary | ICD-10-CM | POA: Diagnosis not present

## 2017-07-16 DIAGNOSIS — G8929 Other chronic pain: Secondary | ICD-10-CM

## 2017-07-16 DIAGNOSIS — M7061 Trochanteric bursitis, right hip: Secondary | ICD-10-CM

## 2017-07-16 DIAGNOSIS — M25551 Pain in right hip: Secondary | ICD-10-CM | POA: Diagnosis not present

## 2017-07-16 DIAGNOSIS — M1611 Unilateral primary osteoarthritis, right hip: Secondary | ICD-10-CM

## 2017-07-16 LAB — HEPATIC FUNCTION PANEL
AG Ratio: 1.6 (calc) (ref 1.0–2.5)
ALKALINE PHOSPHATASE (APISO): 79 U/L (ref 40–115)
ALT: 16 U/L (ref 9–46)
AST: 17 U/L (ref 10–35)
Albumin: 3.9 g/dL (ref 3.6–5.1)
BILIRUBIN INDIRECT: 0.3 mg/dL (ref 0.2–1.2)
BILIRUBIN TOTAL: 0.4 mg/dL (ref 0.2–1.2)
Bilirubin, Direct: 0.1 mg/dL (ref 0.0–0.2)
Globulin: 2.5 g/dL (calc) (ref 1.9–3.7)
TOTAL PROTEIN: 6.4 g/dL (ref 6.1–8.1)

## 2017-07-16 LAB — BASIC METABOLIC PANEL WITH GFR
BUN: 21 mg/dL (ref 7–25)
CALCIUM: 9.8 mg/dL (ref 8.6–10.3)
CO2: 28 mmol/L (ref 20–32)
Chloride: 103 mmol/L (ref 98–110)
Creat: 0.83 mg/dL (ref 0.70–1.11)
GFR, Est African American: 91 mL/min/{1.73_m2} (ref >=60)
GFR, Est Non African American: 79 mL/min/{1.73_m2} (ref >=60)
Glucose, Bld: 107 mg/dL — ABNORMAL HIGH (ref 65–99)
POTASSIUM: 4.8 mmol/L (ref 3.5–5.3)
SODIUM: 138 mmol/L (ref 135–146)

## 2017-07-16 LAB — CBC WITH DIFFERENTIAL/PLATELET
BASOS ABS: 48 {cells}/uL (ref 0–200)
Basophils Relative: 0.5 %
EOS ABS: 342 {cells}/uL (ref 15–500)
Eosinophils Relative: 3.6 %
HCT: 45.5 % (ref 38.5–50.0)
Hemoglobin: 15.9 g/dL (ref 13.2–17.1)
Lymphs Abs: 1777 cells/uL (ref 850–3900)
MCH: 31.9 pg (ref 27.0–33.0)
MCHC: 34.9 g/dL (ref 32.0–36.0)
MCV: 91.4 fL (ref 80.0–100.0)
MONOS PCT: 7.3 %
MPV: 9.2 fL (ref 7.5–12.5)
Neutro Abs: 6641 cells/uL (ref 1500–7800)
Neutrophils Relative %: 69.9 %
PLATELETS: 304 10*3/uL (ref 140–400)
RBC: 4.98 10*6/uL (ref 4.20–5.80)
RDW: 12.5 % (ref 11.0–15.0)
TOTAL LYMPHOCYTE: 18.7 %
WBC mixed population: 694 cells/uL (ref 200–950)
WBC: 9.5 10*3/uL (ref 3.8–10.8)

## 2017-07-16 LAB — LIPID PANEL
Cholesterol: 191 mg/dL (ref <200)
HDL: 57 mg/dL (ref >40)
LDL Cholesterol (Calc): 112 mg/dL (calc) — ABNORMAL HIGH
Non-HDL Cholesterol (Calc): 134 mg/dL (calc) — ABNORMAL HIGH (ref <130)
TRIGLYCERIDES: 117 mg/dL (ref <150)
Total CHOL/HDL Ratio: 3.4 (calc) (ref <5.0)

## 2017-07-16 LAB — MAGNESIUM: MAGNESIUM: 2.3 mg/dL (ref 1.5–2.5)

## 2017-07-16 LAB — TSH: TSH: 2.04 mIU/L (ref 0.40–4.50)

## 2017-07-16 MED ORDER — BUPIVACAINE HCL 0.5 % IJ SOLN
2.0000 mL | INTRAMUSCULAR | Status: AC | PRN
  Administered 2017-07-16: 2 mL via INTRA_ARTICULAR

## 2017-07-16 MED ORDER — LIDOCAINE HCL 1 % IJ SOLN
0.5000 mL | INTRAMUSCULAR | Status: AC | PRN
  Administered 2017-07-16: .5 mL

## 2017-07-16 MED ORDER — METHYLPREDNISOLONE ACETATE 40 MG/ML IJ SUSP
40.0000 mg | INTRAMUSCULAR | Status: AC | PRN
  Administered 2017-07-16: 40 mg via INTRA_ARTICULAR

## 2017-07-16 NOTE — Progress Notes (Signed)
Office Visit Note   Patient: [name] Yu           Date of Birth: [birth year]           MRN: [id number] Visit Date: 07/16/2017              Requested by: [name], [address] [place], [place] [address] PCP: [name], MD   Assessment & Plan: Visit Diagnoses:  1. Chronic right-sided low back pain, with sciatica presence unspecified   2. Pain in right hip   3. DISH (diffuse idiopathic skeletal hyperostosis)   4. Unilateral primary osteoarthritis, right hip   5. Trochanteric bursitis, right hip     Plan: Right greater trochanteric injection performed we reviewed the x-rays and discussed diagnosis of DISH. He has bone-on-bone changes right hip with secondary trochanteric bursitis. His onset 7 weeks ago makes it unlikely this is progressive spinal stenosis. His disks spaces are well maintained and he has calcification of his spinal ligaments making disc herniation much less likely I will recheck him in 3 weeks. Post trochanteric injection he walked much better and had less pain.  Follow-Up Instructions: No Follow-up on file.   Orders:  Orders Placed This Encounter  Procedures  . Large Joint Injection/Arthrocentesis  . XR Lumbar Spine 2-3 Views  . XR HIP UNILAT W OR W/O PELVIS 2-3 VIEWS RIGHT   No orders of the defined types were placed in this encounter.     Procedures: Large Joint Inj Date/Time: 07/16/2017 11:24 AM Performed by: [name] Authorized by: Rodell Perna C   Location:  Hip Site:  R greater trochanter Ultrasound Guidance: No   Fluoroscopic Guidance: No   Arthrogram: No   Medications:  2 mL bupivacaine 0.5 %; 0.5 mL lidocaine 1 %; 40 mg methylPREDNISolone acetate 40 MG/ML Aspiration Attempted: No        Clinical Data: No additional findings.   Subjective: Chief Complaint  Patient presents with  . Right Hip - Pain    HPI 83 year old male here with his wife with referral from Dr. Deberah Pelton for right lateral hip  pain and difficulty walking for the last 7 weeks he started some gabapentin this makes him sleepy. He seems to be worse with standing and walking. He had x-rays performed at day but they're not able to be reviewed. He's had tramadol for pain.  Review of Systems is systems positive for atrial fibrillation, prediabetes, sleep apnea, depression, DISH, hyperlipidemia, coronary artery disease, right hip osteoarthritis, diverticulosis.   Objective: Vital Signs: BP 112/74   Pulse 88   Temp (!) 97.2 F (36.2 C)   Ht 5\' 10"  (1.778 m)   Wt 189 lb (85.7 kg)   BMI 27.12 kg/m   Physical Exam  Constitutional: He is oriented to person, place, and time. He appears well-developed and well-nourished.  HENT:  Head: Normocephalic and atraumatic.  Eyes: Pupils are equal, round, and reactive to light. EOM are normal.  Neck: No tracheal deviation present. No thyromegaly present.  Cardiovascular: Normal rate.   Pulmonary/Chest: Effort normal. He has no wheezes.  Abdominal: Soft. Bowel sounds are normal.  Neurological: He is alert and oriented to person, place, and time.  Skin: Skin is warm and dry. Capillary refill takes less than 2 seconds.  Psychiatric: He has a normal mood and affect. His behavior is normal. Judgment and thought content normal.    Ortho Exam patient is 83 rotation cervical spine right and left without pain the brachial  plexus tenderness. No deformity of the digits no ulnar drift. Patient has 20 internal rotation right hip which reproduces pain exquisite tenderness over the greater trochanteric bursa region. Left trochanter is nontender. Left hip internal and external rotation 30-40 without pain. Negative straight leg raising anterior tib EHL is strong. The ankle jerks are intact. Limited lumbar lateral bending tilting and flexion less than 50% normal range of motion.  Specialty Comments:  No specialty comments available.  Imaging: No results found.   PMFS History: Patient Active  Problem List   Diagnosis Date Noted  . CAD (coronary artery disease) 12/28/2015  . BMI 28.0-28.9,adult 08/21/2015  . Encounter for Medicare annual wellness exam 04/27/2015  . Vitamin D deficiency 03/04/2014  . Medication management 02/17/2014  . ASHD (arteriosclerotic heart disease)   . Depression, major, in remission ([place])   . Diverticulosis   . Rectus sheath hematoma 02/11/2014  . Personal history of colonic polyps 12/29/2012  . Hypertension   . Pre-diabetes   . Mixed hyperlipidemia   . OSA (obstructive sleep apnea)   . Atrial fibrillation [facility])    Past Medical History:  Diagnosis Date  . ASHD (arteriosclerotic heart disease)   . Atrial fib/flutter, transient    off coumadin since 11/2013- CHADSVAC of 2  . Colon polyps    2003 hyperplastic and tubuler adenoma  . Depression   . Diverticulosis   . Dyslipidemia   . Hypertension   . OSA (obstructive sleep apnea)   . Pre-diabetes   . Rectus sheath hematoma 11/21/2013   Dr. Deatra Ina     Family History  Problem Relation Age of Onset  . Alzheimer's disease Mother   . Kidney cancer Father   . Diabetes Brother   . Breast cancer Sister   . Rectal cancer Sister   . Breast cancer Daughter   . Ulcerative colitis Sister   . Heart disease Brother   . Heart disease Sister     Past Surgical History:  Procedure Laterality Date  . HERNIA REPAIR     Social History   Occupational History  . Retired    Social History Main Topics  . Smoking status: Former Smoker    Quit date: 09/14/1993  . Smokeless tobacco: Never Used  . Alcohol use No  . Drug use: No  . Sexual activity: Not on file

## 2017-07-17 ENCOUNTER — Ambulatory Visit: Payer: Self-pay | Admitting: Physician Assistant

## 2017-07-19 ENCOUNTER — Telehealth (INDEPENDENT_AMBULATORY_CARE_PROVIDER_SITE_OTHER): Payer: Self-pay

## 2017-07-19 NOTE — Telephone Encounter (Signed)
Can you please call patient and make 2 week follow up?

## 2017-07-19 NOTE — Telephone Encounter (Signed)
Please advise. Patient did not want to come in for appt.

## 2017-07-19 NOTE — Telephone Encounter (Signed)
Patient called stated that he had an injection done on Tuesday in his hip. Pain is now much worse and goes all the way down his leg. Denies numbness or tingling. Complains of difficulty with ambulation.  Denies any problems with bowel/bladder. I offered an appt for this afternoon to see Jeneen Rinks but they said they live over an hour away. Would like a call back to discuss. [phone]

## 2017-07-19 NOTE — Telephone Encounter (Signed)
Patient does not answer voicemail not set up yet

## 2017-07-19 NOTE — Telephone Encounter (Signed)
I CALLED DISCUSSED. HE CAN ROV IN 2 WKS

## 2017-07-29 NOTE — Telephone Encounter (Signed)
Patient rescheduled for 07/30/17 at 10:00am with Dr [name]

## 2017-07-29 NOTE — Telephone Encounter (Signed)
noted 

## 2017-07-30 ENCOUNTER — Ambulatory Visit (INDEPENDENT_AMBULATORY_CARE_PROVIDER_SITE_OTHER): Payer: Medicare Other | Admitting: Orthopaedic Surgery

## 2017-07-30 ENCOUNTER — Encounter (INDEPENDENT_AMBULATORY_CARE_PROVIDER_SITE_OTHER): Payer: Self-pay | Admitting: Orthopaedic Surgery

## 2017-07-30 VITALS — BP 126/86 | HR 93

## 2017-07-30 DIAGNOSIS — M4810 Ankylosing hyperostosis [Forestier], site unspecified: Secondary | ICD-10-CM | POA: Diagnosis not present

## 2017-07-30 DIAGNOSIS — M1611 Unilateral primary osteoarthritis, right hip: Secondary | ICD-10-CM

## 2017-07-30 NOTE — Progress Notes (Signed)
Office Visit Note   Patient: [name] Yu           Date of Birth: 1927-06-22           MRN: [id number] Visit Date: 07/30/2017              Requested by: [name], [address] [place], [place] [address] PCP: [name], MD   Assessment & Plan: Visit Diagnoses:  1. Unilateral primary osteoarthritis, right hip   2. DISH (diffuse idiopathic skeletal hyperostosis)   3.    Right hip trochanteric bursitis  Plan: Patient got some relief with the injection in his greater trochanter. He has right hip osteoarthritis but is moving and walking better. Continue ibuprofen we discussed potential for GI side effects and recommendation for short-term usage only.  Follow-Up Instructions: Return in about 2 months (around 09/29/2017).   Orders:  No orders of the defined types were placed in this encounter.  No orders of the defined types were placed in this encounter.     Procedures: No procedures performed   Clinical Data: No additional findings.   Subjective: Chief Complaint  Patient presents with  . Lower Back - Follow-up, Pain  . Right Hip - Follow-up, Pain    HPI a-year-old male with DISH syndrome returns post right greater trochanteric injection. He is having back pain lateral hip pain. He states after about a week and a half the shot has significantly improved his symptoms he's walking better and has some pain in his hip down to his knee but much less than previously. He stopped the tramadol Flexeril since he did not like the way it makes him feel. He is using just ibuprofen which is improving his symptoms. He denies bowel or bladder symptoms no fever chills.  Review of Systems systems updated from 07/16/2017,  14 point up date. Unchanged other than as mentioned in history of present illness. Of note is prediabetes, atrial fibrillation, depression sleep apnea, coronary artery disease and right hip osteoarthritis.   Objective: Vital Signs: BP  126/86   Pulse 93   Physical Exam  Constitutional: He is oriented to person, place, and time. He appears well-developed and well-nourished.  HENT:  Head: Normocephalic and atraumatic.  Eyes: Pupils are equal, round, and reactive to light. EOM are normal.  Neck: No tracheal deviation present. No thyromegaly present.  Cardiovascular: Normal rate.   Pulmonary/Chest: Effort normal. He has no wheezes.  Abdominal: Soft. Bowel sounds are normal.  Neurological: He is alert and oriented to person, place, and time.  Skin: Skin is warm and dry. Capillary refill takes less than 2 seconds.  Psychiatric: He has a normal mood and affect. His behavior is normal. Judgment and thought content normal.    Ortho Exam patient still has pain with internal rotation of his right hip consistent with hip osteoarthritis. Less tenderness in the trochanter then 07/16/2017. He is able to walk better and can stand longer than last office visit. Negative straight leg raising 90. Anterior tib EHL is intact. Good capillary refill to the toes and he does have palpable pedal pulse. Specialty Comments:  No specialty comments available.  Imaging: No results found.   PMFS History: Patient Active Problem List   Diagnosis Date Noted  . CAD (coronary artery disease) 12/28/2015  . BMI 28.0-28.9,adult 08/21/2015  . Encounter for Medicare annual wellness exam 04/27/2015  . Vitamin D deficiency 03/04/2014  . Medication management 02/17/2014  . ASHD (arteriosclerotic heart disease)   .  Depression, major, in remission ([place])   . Diverticulosis   . Rectus sheath hematoma 02/11/2014  . Personal history of colonic polyps 12/29/2012  . Hypertension   . Pre-diabetes   . Mixed hyperlipidemia   . OSA (obstructive sleep apnea)   . Atrial fibrillation [facility])    Past Medical History:  Diagnosis Date  . ASHD (arteriosclerotic heart disease)   . Atrial fib/flutter, transient    off coumadin since 11/2013- CHADSVAC of 2  . Colon  polyps    2003 hyperplastic and tubuler adenoma  . Depression   . Diverticulosis   . Dyslipidemia   . Hypertension   . OSA (obstructive sleep apnea)   . Pre-diabetes   . Rectus sheath hematoma 11/21/2013   Dr. Deatra Ina     Family History  Problem Relation Age of Onset  . Alzheimer's disease Mother   . Kidney cancer Father   . Diabetes Brother   . Breast cancer Sister   . Rectal cancer Sister   . Breast cancer Daughter   . Ulcerative colitis Sister   . Heart disease Brother   . Heart disease Sister     Past Surgical History:  Procedure Laterality Date  . HERNIA REPAIR     Social History   Occupational History  . Retired    Social History Main Topics  . Smoking status: Former Smoker    Quit date: 09/14/1993  . Smokeless tobacco: Never Used  . Alcohol use No  . Drug use: No  . Sexual activity: Not on file

## 2017-08-02 ENCOUNTER — Ambulatory Visit (INDEPENDENT_AMBULATORY_CARE_PROVIDER_SITE_OTHER): Payer: Self-pay | Admitting: Orthopaedic Surgery

## 2017-08-07 ENCOUNTER — Ambulatory Visit (INDEPENDENT_AMBULATORY_CARE_PROVIDER_SITE_OTHER): Payer: Medicare Other | Admitting: Orthopaedic Surgery

## 2017-08-09 ENCOUNTER — Ambulatory Visit (INDEPENDENT_AMBULATORY_CARE_PROVIDER_SITE_OTHER): Payer: Self-pay | Admitting: Orthopaedic Surgery

## 2017-08-30 ENCOUNTER — Telehealth: Payer: Self-pay

## 2017-08-30 ENCOUNTER — Other Ambulatory Visit: Payer: Self-pay | Admitting: Physician Assistant

## 2017-08-30 DIAGNOSIS — R05 Cough: Secondary | ICD-10-CM

## 2017-08-30 DIAGNOSIS — R059 Cough, unspecified: Secondary | ICD-10-CM

## 2017-08-30 MED ORDER — AZITHROMYCIN 250 MG PO TABS: ORAL_TABLET | ORAL | 1 refills | Status: AC

## 2017-08-30 NOTE — Progress Notes (Signed)
[  T HAS BEEN INFORMED

## 2017-08-30 NOTE — Telephone Encounter (Signed)
Pt reports still not feeling well after finishing one round of meds for cough & chest cold. Pt would like an Rx for chest cold & cough. He states he felt a little better but just not all they way yet.   He also states he did follow up with ortho.  Please advise?

## 2017-09-09 ENCOUNTER — Ambulatory Visit (INDEPENDENT_AMBULATORY_CARE_PROVIDER_SITE_OTHER): Payer: Medicare Other

## 2017-09-09 ENCOUNTER — Encounter (INDEPENDENT_AMBULATORY_CARE_PROVIDER_SITE_OTHER): Payer: Self-pay | Admitting: Orthopaedic Surgery

## 2017-09-09 ENCOUNTER — Ambulatory Visit (INDEPENDENT_AMBULATORY_CARE_PROVIDER_SITE_OTHER): Payer: Medicare Other | Admitting: Orthopaedic Surgery

## 2017-09-09 ENCOUNTER — Ambulatory Visit (HOSPITAL_COMMUNITY)
Admission: RE | Admit: 2017-09-09 | Discharge: 2017-09-09 | Disposition: A | Discharge status: Home / Self Care | Payer: Medicare Other | Source: Ambulatory Visit | Attending: Physician Assistant | Admitting: Physician Assistant

## 2017-09-09 DIAGNOSIS — M25552 Pain in left hip: Secondary | ICD-10-CM

## 2017-09-09 DIAGNOSIS — M25572 Pain in left ankle and joints of left foot: Secondary | ICD-10-CM

## 2017-09-09 DIAGNOSIS — R059 Cough, unspecified: Secondary | ICD-10-CM

## 2017-09-09 DIAGNOSIS — M25562 Pain in left knee: Secondary | ICD-10-CM

## 2017-09-09 DIAGNOSIS — J8410 Pulmonary fibrosis, unspecified: Secondary | ICD-10-CM | POA: Insufficient documentation

## 2017-09-09 DIAGNOSIS — R05 Cough: Secondary | ICD-10-CM | POA: Diagnosis not present

## 2017-09-09 DIAGNOSIS — I517 Cardiomegaly: Secondary | ICD-10-CM | POA: Insufficient documentation

## 2017-09-09 MED ORDER — HYDROCODONE-ACETAMINOPHEN 5-325 MG PO TABS: 1.0000 | ORAL_TABLET | Freq: Two times a day (BID) | ORAL | 0 refills | Status: DC | PRN

## 2017-09-09 NOTE — Progress Notes (Signed)
Office Visit Note   Patient: [name] Yu           Date of Birth: [birth year]           MRN: [id number] Visit Date: 09/09/2017              Requested by: [name], [address] [place], [place] [address] PCP: [name], MD   Assessment & Plan: Visit Diagnoses:  1. Pain of left hip joint   2. Acute pain of left knee   3. Pain in left ankle and joints of left foot     Plan: Impression is left lower extremity contusion and sprain.  Recommend some traumatic treatment.  Prescription for hydrocodone.  Ice and heat as needed.  Questions encouraged and answered.  Follow-up as needed. Total face to face encounter time was greater than 25 minutes and over half of this time was spent in counseling and/or coordination of care.  Follow-Up Instructions: Return if symptoms worsen or fail to improve.   Orders:  Orders Placed This Encounter  Procedures  . XR HIP UNILAT W OR W/O PELVIS 2-3 VIEWS LEFT  . XR Knee 1-2 Views Left  . XR Ankle 2 Views Left   Meds ordered this encounter  Medications  . HYDROcodone-acetaminophen (NORCO) 5-325 MG tablet    Sig: Take 1-2 tablets by mouth 2 (two) times daily as needed.    Dispense:  30 tablet    Refill:  0      Procedures: No procedures performed   Clinical Data: No additional findings.   Subjective: Chief Complaint  Patient presents with  . Left Hip - Pain    Patient is a 83 year old gentleman who fell onto his left side about a week ago.  He complains of left hip knee and ankle pain.  He endorses pain with activity and weightbearing.  Ibuprofen does help.  He is able to continue to weight-bear and walk although with discomfort and at a slow pace.  Denies any numbness and tingling.    Review of Systems  Constitutional: Negative.   All other systems reviewed and are negative.    Objective: Vital Signs: There were no vitals taken for this visit.  Physical Exam  Constitutional: He is oriented  to person, place, and time. He appears well-developed and well-nourished.  Pulmonary/Chest: Effort normal.  Abdominal: Soft.  Neurological: He is alert and oriented to person, place, and time.  Skin: Skin is warm.  Psychiatric: He has a normal mood and affect. His behavior is normal. Judgment and thought content normal.  Nursing note and vitals reviewed.   Ortho Exam Left hip exam shows painless rotation.  Slight tenderness on the lateral aspect of the greater trochanter  Left knee exam shows no joint effusion or bruising or swelling.  Collaterals and cruciates are stable.  Prepatellar bursa is tender to palpation.  Left ankle exam is essentially benign.  Specialty Comments:  No specialty comments available.  Imaging: Xr Hip Unilat W Or W/o Pelvis 2-3 Views Left  Result Date: 09/09/2017 No acute abnormalities  Xr Knee 1-2 Views Left  Result Date: 09/09/2017 No acute or structural abnormalities  Xr Ankle 2 Views Left  Result Date: 09/09/2017 Negative for acute findings    PMFS History: Patient Active Problem List   Diagnosis Date Noted  . CAD (coronary artery disease) 12/28/2015  . BMI 28.0-28.9,adult 08/21/2015  . Encounter for Medicare annual wellness exam 04/27/2015  . Vitamin D deficiency 03/04/2014  .  Medication management 02/17/2014  . ASHD (arteriosclerotic heart disease)   . Depression, major, in remission ([place])   . Diverticulosis   . Rectus sheath hematoma 02/11/2014  . Personal history of colonic polyps 12/29/2012  . Hypertension   . Pre-diabetes   . Mixed hyperlipidemia   . OSA (obstructive sleep apnea)   . Atrial fibrillation [facility])    Past Medical History:  Diagnosis Date  . ASHD (arteriosclerotic heart disease)   . Atrial fib/flutter, transient    off coumadin since 11/2013- CHADSVAC of 2  . Colon polyps    2003 hyperplastic and tubuler adenoma  . Depression   . Diverticulosis   . Dyslipidemia   . Hypertension   . OSA (obstructive sleep  apnea)   . Pre-diabetes   . Rectus sheath hematoma 11/21/2013   Dr. Deatra Ina     Family History  Problem Relation Age of Onset  . Alzheimer's disease Mother   . Kidney cancer Father   . Diabetes Brother   . Breast cancer Sister   . Rectal cancer Sister   . Breast cancer Daughter   . Ulcerative colitis Sister   . Heart disease Brother   . Heart disease Sister     Past Surgical History:  Procedure Laterality Date  . HERNIA REPAIR     Social History   Occupational History  . Occupation: Retired  Tobacco Use  . Smoking status: Former Smoker    Last attempt to quit: 09/14/1993    Years since quitting: 24.0  . Smokeless tobacco: Never Used  Substance and Sexual Activity  . Alcohol use: No  . Drug use: No  . Sexual activity: Not on file

## 2017-09-25 ENCOUNTER — Telehealth (INDEPENDENT_AMBULATORY_CARE_PROVIDER_SITE_OTHER): Payer: Self-pay | Admitting: Orthopaedic Surgery

## 2017-09-25 NOTE — Telephone Encounter (Signed)
Pt wife called and her husband is in extreme pain. Pt wife stated her husband is barely walking and has little to no energy. I tried to sched pt for this week due to the level of pain stated. Pt wife wanted to know if her husband could come in this week.  Please call [phone]

## 2017-09-25 NOTE — Telephone Encounter (Signed)
Appointment made for patient tomorrow.

## 2017-09-26 ENCOUNTER — Encounter (INDEPENDENT_AMBULATORY_CARE_PROVIDER_SITE_OTHER): Payer: Self-pay | Admitting: Orthopaedic Surgery

## 2017-09-26 ENCOUNTER — Ambulatory Visit (INDEPENDENT_AMBULATORY_CARE_PROVIDER_SITE_OTHER): Payer: Self-pay

## 2017-09-26 ENCOUNTER — Ambulatory Visit (INDEPENDENT_AMBULATORY_CARE_PROVIDER_SITE_OTHER): Payer: Medicare Other | Admitting: Orthopaedic Surgery

## 2017-09-26 DIAGNOSIS — M545 Low back pain: Secondary | ICD-10-CM

## 2017-09-26 DIAGNOSIS — M25531 Pain in right wrist: Secondary | ICD-10-CM

## 2017-09-26 DIAGNOSIS — S7002XD Contusion of left hip, subsequent encounter: Secondary | ICD-10-CM | POA: Diagnosis not present

## 2017-09-26 MED ORDER — HYDROCODONE-ACETAMINOPHEN 5-325 MG PO TABS: 1.0000 | ORAL_TABLET | Freq: Two times a day (BID) | ORAL | 0 refills | Status: DC | PRN

## 2017-09-26 NOTE — Progress Notes (Signed)
Office Visit Note   Patient: [name] Yu           Date of Birth: 07/19/1927           MRN: [id number] Visit Date: 09/26/2017              Requested by: [name], [address] [place] [place], [place] [address] PCP: [name], MD   Assessment & Plan: Visit Diagnoses:  1. Pain in right wrist   2. Acute bilateral low back pain, with sciatica presence unspecified   3. Contusion of left hip, subsequent encounter     Plan: I renewed his Norco he can take 1/2 tablet twice a day as needed for pain.  To get up and uses a walker all the time when he walks at home to avoid falling.  Discussed soft tissue contusion that occurred with the fall and that he should get gradual resolution of his symptoms.  He has significant right hip osteoarthritis but got good relief from previous intra-articular injection of his hip joint done by me still continues to be doing well.  His pain is on the opposite left side where he landed when he fell.  Discussed the importance of continued to daily walk and I will check him after the holidays.  If he gets increased symptoms we could consider a short course of prednisone 5 mg but with his prediabetes will defer this course of treatment today.  Recheck in 5 weeks.  Follow-Up Instructions: Return in about 5 weeks (around 10/31/2017).   Orders:  Orders Placed This Encounter  Procedures  . XR Wrist Complete Right  . XR Lumbar Spine 2-3 Views   Meds ordered this encounter  Medications  . HYDROcodone-acetaminophen (NORCO) 5-325 MG tablet    Sig: Take 1-2 tablets by mouth 2 (two) times daily as needed.    Dispense:  30 tablet    Refill:  0      Procedures: No procedures performed   Clinical Data: No additional findings.   Subjective: Chief Complaint  Patient presents with  . Right Wrist - Pain    HPI 83 year old male returns he has had 2 falling episodes in the last 3 weeks with the most recent with a skin tear of the distal  ulnar forearm.  He fell trying to get up from the sofa landed on the left side and has had persistent pain in the buttocks left lateral thigh pain that radiates down past his knee down to his ankle.  He got some improvement with Norco but is run out and was only taking 1/2 tablet.  Been prediabetic for 20 years with A1c of 5.7 in June.  No peripheral neuropathy.  He has a walker at home he has been using for ambulation he uses a cane today and ambulate in the office slowly.  In the exam room he came back by wheelchair.  Review of Systems review of systems unchanged from 07/30/2017 office note other than as mentioned above in HPI.  Falls in the last 3 weeks.   Objective: Vital Signs: There were no vitals taken for this visit.  Physical Exam  Constitutional: He is oriented to person, place, and time. He appears well-developed and well-nourished.  HENT:  Head: Normocephalic and atraumatic.  Eyes: EOM are normal. Pupils are equal, round, and reactive to light.  Neck: No tracheal deviation present. No thyromegaly present.  Cardiovascular: Normal rate.  Pulmonary/Chest: Effort normal. He has no wheezes.  Abdominal: Soft. Bowel  sounds are normal.  Neurological: He is alert and oriented to person, place, and time.  Skin: Skin is warm and dry. Capillary refill takes less than 2 seconds.  Psychiatric: He has a normal mood and affect. His behavior is normal. Judgment and thought content normal.    Ortho Exam previous ecchymosis is resolved along the left buttocks left lateral thigh.  He has tenderness buttocks over the trochanter lateral and posterior thigh lateral knee lateral calf.  Bilateral varus knees.  Previous x-rays showed medial compartment narrowing negative for fracture.  Knee and ankle jerk are intact.  Distal pulses are palpable. Specialty Comments:  No specialty comments available.  Imaging: Xr Wrist Complete Right  Result Date: 09/26/2017 AP lateral right wrist x-rays demonstrate  calcification of the TFCC.  Radiocarpal narrowing.  Negative for acute fracture. Impression: Degenerative changes of the wrist with calcification at the ulnar carpal joint likely involving TFCC partial calcification.  Joint space narrowing of the radiocarpal joint negative for acute fracture after recent fall  Xr Lumbar Spine 2-3 Views  Result Date: 09/26/2017 AP lateral lumbar x-rays demonstrate ankylosing spondylitis 1-2 cm lateral osteophytes.  Facet arthropathy.  No change from previous lumbar x-rays with no acute fracture. 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    PMFS History: Patient Active Problem List   Diagnosis Date Noted  . CAD (coronary artery disease) 12/28/2015  . BMI 28.0-28.9,adult 08/21/2015  . Encounter for Medicare annual wellness exam 04/27/2015  . Vitamin D deficiency 03/04/2014  . Medication management 02/17/2014  . ASHD (arteriosclerotic heart disease)   . Depression, major, in remission ([place])   . Diverticulosis   . Rectus sheath hematoma 02/11/2014  . Personal history of colonic polyps 12/29/2012  . Hypertension   . Pre-diabetes   . Mixed hyperlipidemia   . OSA (obstructive sleep apnea)   . Atrial fibrillation [facility])    Past Medical History:  Diagnosis Date  . ASHD (arteriosclerotic heart disease)   . Atrial fib/flutter, transient    off coumadin since 11/2013- CHADSVAC of 2  . Colon polyps    2003 hyperplastic and tubuler adenoma  . Depression   . Diverticulosis   . Dyslipidemia   . Hypertension   . OSA (obstructive sleep apnea)   . Pre-diabetes   . Rectus sheath hematoma 11/21/2013   Dr. Deatra Ina     Family History  Problem Relation Age of Onset  . Alzheimer's disease Mother   . Kidney cancer Father   . Diabetes Brother   . Breast cancer Sister   . Rectal cancer Sister   . Breast cancer Daughter   . Ulcerative colitis Sister   . Heart disease Brother   . Heart disease  Sister     Past Surgical History:  Procedure Laterality Date  . HERNIA REPAIR     Social History   Occupational History  . Occupation: Retired  Tobacco Use  . Smoking status: Former Smoker    Last attempt to quit: 09/14/1993    Years since quitting: 24.0  . Smokeless tobacco: Never Used  Substance and Sexual Activity  . Alcohol use: No  . Drug use: No  . Sexual activity: Not on file

## 2017-10-02 ENCOUNTER — Ambulatory Visit (INDEPENDENT_AMBULATORY_CARE_PROVIDER_SITE_OTHER): Payer: Medicare Other | Admitting: Orthopaedic Surgery

## 2017-10-13 ENCOUNTER — Other Ambulatory Visit: Payer: Self-pay | Admitting: Internal Medicine

## 2017-10-13 DIAGNOSIS — F411 Generalized anxiety disorder: Secondary | ICD-10-CM

## 2017-10-14 ENCOUNTER — Other Ambulatory Visit: Payer: Self-pay | Admitting: Internal Medicine

## 2017-10-15 HISTORY — PX: CATARACT EXTRACTION, BILATERAL: SHX1313

## 2017-10-16 ENCOUNTER — Telehealth (INDEPENDENT_AMBULATORY_CARE_PROVIDER_SITE_OTHER): Payer: Self-pay | Admitting: Orthopaedic Surgery

## 2017-10-16 ENCOUNTER — Encounter: Payer: Self-pay | Admitting: *Deleted

## 2017-10-16 DIAGNOSIS — F419 Anxiety disorder, unspecified: Secondary | ICD-10-CM | POA: Insufficient documentation

## 2017-10-16 MED ORDER — HYDROCODONE-ACETAMINOPHEN 5-325 MG PO TABS: 1.0000 | ORAL_TABLET | Freq: Two times a day (BID) | ORAL | 0 refills | Status: DC | PRN

## 2017-10-16 NOTE — Telephone Encounter (Signed)
Patient's wife called requesting RX refill on the patient's Hydrocodone.  CB#[id number].  Thank you.

## 2017-10-16 NOTE — Telephone Encounter (Signed)
Printed and waiting for Dr [name] to sign. Will call patient when ready to be picked up at front desk.

## 2017-10-16 NOTE — Telephone Encounter (Signed)
oK PRINT I WILL SIGN SAME SIG AS BEFORE . THANKS

## 2017-10-16 NOTE — Telephone Encounter (Signed)
Ok to rf? 

## 2017-10-29 ENCOUNTER — Encounter: Payer: Self-pay | Admitting: Internal Medicine

## 2017-10-29 ENCOUNTER — Ambulatory Visit (INDEPENDENT_AMBULATORY_CARE_PROVIDER_SITE_OTHER): Payer: Medicare HMO | Admitting: Internal Medicine

## 2017-10-29 VITALS — BP 108/72 | HR 80 | Temp 97.3°F | Resp 16 | Ht 69.0 in | Wt 192.2 lb

## 2017-10-29 DIAGNOSIS — I2510 Atherosclerotic heart disease of native coronary artery without angina pectoris: Secondary | ICD-10-CM

## 2017-10-29 DIAGNOSIS — Z125 Encounter for screening for malignant neoplasm of prostate: Secondary | ICD-10-CM

## 2017-10-29 DIAGNOSIS — Z1212 Encounter for screening for malignant neoplasm of rectum: Secondary | ICD-10-CM

## 2017-10-29 DIAGNOSIS — E782 Mixed hyperlipidemia: Secondary | ICD-10-CM | POA: Diagnosis not present

## 2017-10-29 DIAGNOSIS — J40 Bronchitis, not specified as acute or chronic: Secondary | ICD-10-CM

## 2017-10-29 DIAGNOSIS — R7303 Prediabetes: Secondary | ICD-10-CM | POA: Diagnosis not present

## 2017-10-29 DIAGNOSIS — I482 Chronic atrial fibrillation: Secondary | ICD-10-CM | POA: Diagnosis not present

## 2017-10-29 DIAGNOSIS — N401 Enlarged prostate with lower urinary tract symptoms: Secondary | ICD-10-CM

## 2017-10-29 DIAGNOSIS — I2583 Coronary atherosclerosis due to lipid rich plaque: Secondary | ICD-10-CM

## 2017-10-29 DIAGNOSIS — N390 Urinary tract infection, site not specified: Secondary | ICD-10-CM

## 2017-10-29 DIAGNOSIS — Z1211 Encounter for screening for malignant neoplasm of colon: Secondary | ICD-10-CM

## 2017-10-29 DIAGNOSIS — Z136 Encounter for screening for cardiovascular disorders: Secondary | ICD-10-CM | POA: Diagnosis not present

## 2017-10-29 DIAGNOSIS — E559 Vitamin D deficiency, unspecified: Secondary | ICD-10-CM | POA: Diagnosis not present

## 2017-10-29 DIAGNOSIS — Z79899 Other long term (current) drug therapy: Secondary | ICD-10-CM | POA: Diagnosis not present

## 2017-10-29 DIAGNOSIS — Z0000 Encounter for general adult medical examination without abnormal findings: Secondary | ICD-10-CM | POA: Diagnosis not present

## 2017-10-29 DIAGNOSIS — Z0001 Encounter for general adult medical examination with abnormal findings: Secondary | ICD-10-CM

## 2017-10-29 DIAGNOSIS — Z87891 Personal history of nicotine dependence: Secondary | ICD-10-CM

## 2017-10-29 DIAGNOSIS — I77810 Thoracic aortic ectasia: Secondary | ICD-10-CM

## 2017-10-29 DIAGNOSIS — I10 Essential (primary) hypertension: Secondary | ICD-10-CM | POA: Diagnosis not present

## 2017-10-29 DIAGNOSIS — R7309 Other abnormal glucose: Secondary | ICD-10-CM

## 2017-10-29 DIAGNOSIS — I480 Paroxysmal atrial fibrillation: Secondary | ICD-10-CM

## 2017-10-29 MED ORDER — DOXYCYCLINE HYCLATE 100 MG PO CAPS: ORAL_CAPSULE | ORAL | 0 refills | Status: DC

## 2017-10-29 NOTE — Progress Notes (Signed)
[facility]   [name], M.D.     [name] Yu. [name] Yu, P.A.-C [name], [place]                [address] [place], [place] [address] Telephone [phone] Telefax [phone] Annual  Screening/Preventative Visit  & Comprehensive Evaluation & Examination     This very nice 84 y.o. MWM  presents for a Screening/Preventative Visit & comprehensive evaluation and management of multiple medical co-morbidities.  Patient has been followed for HTN, Prediabetes, Hyperlipidemia and Vitamin D Deficiency. Patient has had several falls recently and was seen & X-ray'd by Dr [name]. Also had recent Rt Hip steroid injection with pain relief. Now he's having Lt Hip pains worsened after his falls. Patient seems slower to answer and wife is correcting and "filling in the blanks". He's creeping slowly with a Rollator. Also , he's c/o recent cough & congestion.     HTN predates since 2005. Patient's BP has been controlled at home.  Today's BP is at goal - 108/72. Patient has hx/o pAfib since d/c'd consequent of a large rectus sheath hematoma & is only on LD bASA.  EKG today shows NSR. Heart cath in 2003 was Negative. Cardiolite in 2007 was also Negative.  Patient denies any cardiac symptoms as chest pain, palpitations, shortness of breath, dizziness or ankle swelling.     Patient's hyperlipidemia is not controlled with diet and meds deferred for age. Last lipids were not at goal: Lab Results  Component Value Date   CHOL 191 10/29/2017   HDL 47 10/29/2017   LDLCALC 122 (H) 04/08/2017   TRIG 184 (H) 10/29/2017   CHOLHDL 4.1 10/29/2017      Patient has prediabetes (A1c 5.7%/2014)  and patient denies reactive hypoglycemic symptoms, visual blurring, diabetic polys or paresthesias. Last A1c was Normal & at goal: Lab Results  Component Value Date   HGBA1C 5.5 04/08/2017       Finally, patient has history of  Vitamin D Deficiency ("33"/2008) and last vitamin D was at goal:  Lab Results  Component Value Date   VD25OH 61 04/08/2017   Current Outpatient Medications on File Prior to Visit  Medication Sig  . aspirin EC 81 MG tablet Take 1 tablet (81 mg total) by mouth daily.  . Calcium Carbonate Antacid (TUMS E-X PO) Take 2 tablets by mouth daily.  . Cholecalciferol 5000 UNITS TABS Take 1 tablet by mouth daily.  . citalopram (CELEXA) 40 MG tablet TAKE 1 TABLET BY MOUTH EVERY DAY  . diltiazem (CARDIZEM) 60 MG tablet TAKE 1 TABLET BY MOUTH FOUR TIMES DAILY AS NEEDED.  . finasteride (PROSCAR) 5 MG tablet TAKE 1 TABLET BY MOUTH EVERY DAY  . HYDROcodone-acetaminophen (NORCO) 5-325 MG tablet Take 1-2 tablets by mouth 2 (two) times daily as needed.  Marland Kitchen LORazepam (ATIVAN) 2 MG tablet Take 1/2 to 1 tablet ONLY if needed at hour of sleep  . Magnesium 200 MG TABS Take 1 tablet by mouth daily.  . Multiple Vitamin (MULTIVITAMIN) capsule Take 1 capsule by mouth daily.  . Omega-3 Fatty Acids (FISH OIL PO) Take 1 tablet by mouth daily.  . vitamin C (ASCORBIC ACID) 500 MG tablet Take 1,000 mg by mouth daily.   No current facility-administered medications on file prior to visit.    Allergies  Allergen Reactions  . Gabapentin Confusion  . Ciprofloxacin  dysphoria  . Novocain [Procaine Hcl]   . Prednisone Nervousness   Past Medical History:  Diagnosis Date  . ASHD (arteriosclerotic heart disease)   . Atrial fib/flutter, transient    off coumadin since 11/2013- CHADSVAC of 2  . Colon polyps    2003 hyperplastic and tubuler adenoma  . Depression   . Diverticulosis   . Dyslipidemia   . Hypertension   . OSA (obstructive sleep apnea)   . Pre-diabetes   . Rectus sheath hematoma 11/21/2013   Dr. Deatra Ina    Health Maintenance  Topic Date Due  . Samul Dada  12/16/2026  . INFLUENZA VACCINE  Completed  . PNA vac Low Risk Adult  Completed   Immunization History  Administered Date(s) Administered  . DT  07/06/2014  . Influenza Split 07/20/2014, 07/18/2016  . Influenza, High Dose Seasonal PF 08/22/2015, 07/15/2017  . Pneumococcal Conjugate-13 07/06/2014  . Pneumococcal Polysaccharide-23 06/09/2013  . Td 12/15/2016  . Zoster 10/15/2005   Last Colon - 2003 - Dr Deatra Ina  Past Surgical History:  Procedure Laterality Date  . HERNIA REPAIR     Family History  Problem Relation Age of Onset  . Alzheimer's disease Mother   . Kidney cancer Father   . Diabetes Brother   . Breast cancer Sister   . Rectal cancer Sister   . Breast cancer Daughter   . Ulcerative colitis Sister   . Heart disease Brother   . Heart disease Sister    Social History   Socioeconomic History  . Marital status: Married    Spouse name: Not on file  . Number of children: 5  . Years of education: Not on file  . Highest education level: Not on file  Occupational History  . Occupation: Retired  Tobacco Use  . Smoking status: Former Smoker    Last attempt to quit: 09/14/1993    Years since quitting: 24.1  . Smokeless tobacco: Never Used  Substance and Sexual Activity  . Alcohol use: No  . Drug use: No  . Sexual activity: Not on file    ROS Constitutional: Denies fever, chills, weight loss/gain, headaches, insomnia,  night sweats or change in appetite. Does c/o fatigue. Eyes: Denies redness, blurred vision, diplopia, discharge, itchy or watery eyes.  ENT: Denies discharge, congestion, post nasal drip, epistaxis, sore throat, earache, hearing loss, dental pain, Tinnitus, Vertigo, Sinus pain or snoring.  Cardio: Denies chest pain, palpitations, irregular heartbeat, syncope, dyspnea, diaphoresis, orthopnea, PND, claudication or edema Respiratory: denies , dyspnea, DOE, pleurisy, hoarseness, laryngitis or wheezing. C/o recent cough and chest congestion. Gastrointestinal: Denies dysphagia, heartburn, reflux, water brash, pain, cramps, nausea, vomiting, bloating, diarrhea, constipation, hematemesis, melena,  hematochezia, jaundice or hemorrhoids Genitourinary: Denies dysuria, frequency, urgency, nocturia, hesitancy, discharge, hematuria or flank pain Musculoskeletal: Denies arthralgia, myalgia, stiffness, Jt. Swelling, pain, limp or strain/sprain. Denies Falls. Skin: Denies puritis, rash, hives, warts, acne, eczema or change in skin lesion Neuro: No weakness, tremor, incoordination, spasms, paresthesia or pain Psychiatric: Denies confusion, memory loss or sensory loss. Denies Depression. Endocrine: Denies change in weight, skin, hair change, nocturia, and paresthesia, diabetic polys, visual blurring or hyper / hypo glycemic episodes.  Heme/Lymph: No excessive bleeding, bruising or enlarged lymph nodes.  Physical Exam  BP 108/72   Pulse 80   Temp (!) 97.3 F (36.3 C)   Resp 16   Ht 5\' 9"  (1.753 m)   Wt 192 lb 3.2 oz (87.2 kg)   BMI 28.38 kg/m   General Appearance: Well nourished and well  groomed and in no apparent distress.  Eyes: PERRLA, EOMs, conjunctiva no swelling or erythema, normal fundi and vessels. Sinuses: No frontal/maxillary tenderness ENT/Mouth: EACs patent / TMs  nl. Nares clear without erythema, swelling, mucoid exudates. Oral hygiene is good. No erythema, swelling, or exudate. Tongue normal, non-obstructing. Tonsils not swollen or erythematous. Hearing normal.  Neck: Supple, thyroid normal. No bruits, nodes or JVD. Respiratory: Respiratory effort normal.  Few scattered  Rales and rhonci, w/o wheezing or stridor. Cardio: Heart sounds are normal with regular rate and rhythm and no murmurs, rubs or gallops. Peripheral pulses are normal and equal bilaterally without edema. No aortic or femoral bruits. Chest: sl barrel chested.  Abdomen: Soft, with Nl bowel sounds. Nontender, no guarding, rebound, hernias, masses, or organomegaly.  Lymphatics: Non tender without lymphadenopathy.  Genitourinary:  DRE - deferred for age. Musculoskeletal: Generalized decrease in muscle power , tone  & bulk and limping gait supported w/a Rollator. Skin: Warm and dry without rashes, lesions, cyanosis, clubbing or  ecchymosis.  Neuro: Cranial nerves intact, reflexes equal bilaterally. Normal muscle tone, no cerebellar symptoms. Sensation intact.  Pysch: Alert and oriented X 3 with normal affect, insight and judgment appropriate.   Assessment and Plan  1. Annual Preventative/Screening Exam   2. Essential hypertension  - EKG 12-Lead - [place], RETROPERITNL ABD,  LTD - Urinalysis, Routine w reflex microscopic - Microalbumin / creatinine urine ratio - CBC with Differential/Platelet - BASIC METABOLIC PANEL WITH GFR - Magnesium - TSH  3. Hyperlipidemia, mixed  - EKG 12-Lead - [place], RETROPERITNL ABD,  LTD - Hepatic function panel - Lipid panel - TSH  4. Pre-diabetes  - EKG 12-Lead - [place], RETROPERITNL ABD,  LTD - Hemoglobin A1c - Insulin, random  5. Vitamin D deficiency  - VITAMIN D 25 Hydroxy  6. Abnormal glucose  - Hemoglobin A1c - Insulin, random  7. Bronchitis  - doxycycline (VIBRAMYCIN) 100 MG capsule; Take 1 capsule daily with food for infection  Dispense: 15 capsule  8. Paroxysmal atrial fibrillation (HCC)  - EKG 12-Lead - TSH  9. Coronary artery disease due to lipid rich plaque  - EKG 12-Lead - Lipid panel  10. Screening for colorectal cancer  - POC Hemoccult Bld/Stl   11. Prostate cancer screening  - PSA  12. Benign localized prostatic hyperplasia with lower urinary tract symptoms (LUTS)  - PSA  13. Screening for ischemic heart disease  - EKG 12-Lead  14. Former smoker  - EKG 12-Lead - [place], RETROPERITNL ABD,  LTD  15. Screening for AAA (aortic abdominal aneurysm)  - [place], RETROPERITNL ABD,  LTD  16. Ectatic thoracic aorta ([place])  - [place], RETROPERITNL ABD,  LTD  17. Medication management  - Urinalysis, Routine w reflex microscopic - Microalbumin / creatinine urine ratio - CBC with Differential/Platelet - BASIC METABOLIC PANEL WITH GFR -  Hepatic function panel - Magnesium - Lipid panel - TSH - Hemoglobin A1c - Insulin, random - VITAMIN D 25 Hydroxy         Patient was counseled in prudent diet, weight control to achieve/maintain BMI less than 25, BP monitoring, regular exercise and medications as discussed.  Discussed med effects and SE's. Routine screening labs and tests as requested with regular follow-up as recommended. Recommended taper Crelaxa 40 mg to 1/2 tab = 20 mg . Over 40 minutes of exam, counseling, chart review and high complex critical decision making was performed

## 2017-10-29 NOTE — Patient Instructions (Signed)

## 2017-10-30 ENCOUNTER — Ambulatory Visit (INDEPENDENT_AMBULATORY_CARE_PROVIDER_SITE_OTHER): Payer: Medicare HMO | Admitting: Orthopaedic Surgery

## 2017-10-30 ENCOUNTER — Other Ambulatory Visit: Payer: Self-pay | Admitting: Internal Medicine

## 2017-10-30 ENCOUNTER — Encounter (INDEPENDENT_AMBULATORY_CARE_PROVIDER_SITE_OTHER): Payer: Self-pay | Admitting: Orthopaedic Surgery

## 2017-10-30 VITALS — BP 119/78 | HR 75 | Ht 70.0 in | Wt 191.0 lb

## 2017-10-30 DIAGNOSIS — M5441 Lumbago with sciatica, right side: Secondary | ICD-10-CM | POA: Diagnosis not present

## 2017-10-30 DIAGNOSIS — N390 Urinary tract infection, site not specified: Secondary | ICD-10-CM

## 2017-10-30 DIAGNOSIS — M4326 Fusion of spine, lumbar region: Secondary | ICD-10-CM | POA: Diagnosis not present

## 2017-10-30 DIAGNOSIS — M1611 Unilateral primary osteoarthritis, right hip: Secondary | ICD-10-CM

## 2017-10-30 DIAGNOSIS — M5442 Lumbago with sciatica, left side: Secondary | ICD-10-CM

## 2017-10-30 LAB — TSH: TSH: 2.31 mIU/L (ref 0.40–4.50)

## 2017-10-30 LAB — LIPID PANEL
CHOL/HDL RATIO: 4.1 (calc) (ref <5.0)
Cholesterol: 191 mg/dL (ref <200)
HDL: 47 mg/dL (ref >40)
LDL CHOLESTEROL (CALC): 113 mg/dL — AB
NON-HDL CHOLESTEROL (CALC): 144 mg/dL — AB (ref <130)
Triglycerides: 184 mg/dL — ABNORMAL HIGH (ref <150)

## 2017-10-30 LAB — CBC WITH DIFFERENTIAL/PLATELET
BASOS ABS: 29 {cells}/uL (ref 0–200)
Basophils Relative: 0.3 %
EOS ABS: 284 {cells}/uL (ref 15–500)
Eosinophils Relative: 2.9 %
HCT: 43.8 % (ref 38.5–50.0)
HEMOGLOBIN: 15.1 g/dL (ref 13.2–17.1)
LYMPHS ABS: 1686 {cells}/uL (ref 850–3900)
MCH: 31.2 pg (ref 27.0–33.0)
MCHC: 34.5 g/dL (ref 32.0–36.0)
MCV: 90.5 fL (ref 80.0–100.0)
MONOS PCT: 8.4 %
MPV: 8.9 fL (ref 7.5–12.5)
NEUTROS ABS: 6978 {cells}/uL (ref 1500–7800)
Neutrophils Relative %: 71.2 %
Platelets: 331 10*3/uL (ref 140–400)
RBC: 4.84 10*6/uL (ref 4.20–5.80)
RDW: 11.8 % (ref 11.0–15.0)
Total Lymphocyte: 17.2 %
WBC mixed population: 823 cells/uL (ref 200–950)
WBC: 9.8 10*3/uL (ref 3.8–10.8)

## 2017-10-30 LAB — BASIC METABOLIC PANEL WITH GFR
BUN: 12 mg/dL (ref 7–25)
CO2: 25 mmol/L (ref 20–32)
CREATININE: 1.04 mg/dL (ref 0.70–1.11)
Calcium: 9.9 mg/dL (ref 8.6–10.3)
Chloride: 103 mmol/L (ref 98–110)
GFR, EST AFRICAN AMERICAN: 74 mL/min/{1.73_m2} (ref >=60)
GFR, EST NON AFRICAN AMERICAN: 64 mL/min/{1.73_m2} (ref >=60)
Glucose, Bld: 101 mg/dL — ABNORMAL HIGH (ref 65–99)
Potassium: 4.4 mmol/L (ref 3.5–5.3)
SODIUM: 139 mmol/L (ref 135–146)

## 2017-10-30 LAB — URINALYSIS, ROUTINE W REFLEX MICROSCOPIC
BACTERIA UA: NONE SEEN /HPF
Bilirubin Urine: NEGATIVE
GLUCOSE, UA: NEGATIVE
HGB URINE DIPSTICK: NEGATIVE
Ketones, ur: NEGATIVE
Nitrite: NEGATIVE
RBC / HPF: NONE SEEN /HPF (ref 0–2)
Specific Gravity, Urine: 1.015 (ref 1.001–1.03)
pH: 6.5 (ref 5.0–8.0)

## 2017-10-30 LAB — HEPATIC FUNCTION PANEL
AG RATIO: 1.7 (calc) (ref 1.0–2.5)
ALKALINE PHOSPHATASE (APISO): 78 U/L (ref 40–115)
ALT: 12 U/L (ref 9–46)
AST: 18 U/L (ref 10–35)
Albumin: 4 g/dL (ref 3.6–5.1)
BILIRUBIN INDIRECT: 0.4 mg/dL (ref 0.2–1.2)
BILIRUBIN TOTAL: 0.5 mg/dL (ref 0.2–1.2)
Bilirubin, Direct: 0.1 mg/dL (ref 0.0–0.2)
Globulin: 2.4 g/dL (calc) (ref 1.9–3.7)
Total Protein: 6.4 g/dL (ref 6.1–8.1)

## 2017-10-30 LAB — HEMOGLOBIN A1C
EAG (MMOL/L): 6.2 (calc)
Hgb A1c MFr Bld: 5.5 % of total Hgb (ref <5.7)
Mean Plasma Glucose: 111 (calc)

## 2017-10-30 LAB — MICROALBUMIN / CREATININE URINE RATIO
Creatinine, Urine: 146 mg/dL (ref 20–320)
MICROALB/CREAT RATIO: 67 ug/mg{creat} — AB (ref <30)
Microalb, Ur: 9.8 mg/dL

## 2017-10-30 LAB — INSULIN, RANDOM: INSULIN: 19.9 u[IU]/mL — AB (ref 2.0–19.6)

## 2017-10-30 LAB — VITAMIN D 25 HYDROXY (VIT D DEFICIENCY, FRACTURES): Vit D, 25-Hydroxy: 67 ng/mL (ref 30–100)

## 2017-10-30 LAB — PSA: PSA: 2.1 ng/mL (ref <=4.0)

## 2017-10-30 LAB — MAGNESIUM: Magnesium: 2.1 mg/dL (ref 1.5–2.5)

## 2017-10-30 NOTE — Addendum Note (Signed)
Addended by: Eulis Canner on: 10/30/2017 01:25 PM   Modules accepted: Orders

## 2017-10-30 NOTE — Progress Notes (Signed)
Office Visit Note   Patient: [name] Yu           Date of Birth: [birth year]           MRN: [id number] Visit Date: 10/30/2017              Requested by: [name], [address] [place], [place] [address] PCP: [name], MD   Assessment & Plan: Visit Diagnoses:  1. Acute bilateral low back pain with bilateral sciatica   2. Ankylosis of lumbar spine   3. Unilateral primary osteoarthritis, right hip   4.        claudication symptoms  Plan: Patient certainly has symptomatic osteoarthritic right hip.  I would recommend proceeding with an MRI scan due to his neurogenic claudication symptoms grafts that shows significant ankylosis in the lumbar spine large spurs anteriorly and laterally cillosis anteriorly over several levels of lumbar spine.  He may potentially have a stress fracture in the lumbar spine but more likely has some spinal stenosis consistent with his claudication symptoms.  He has some retrolisthesis at L3-4 noted on plain radiographs.  Office follow-up after lumbar MRI and we can make an assessment about claudication symptoms versus his right hip osteoarthritis.  Long discussion about narcotic problems past from narcotic overdose.  Prescription for Norco 5/325 g dose number 10 tablets prescribed 1/2 tablet p.o. twice daily as needed he will uses sparingly.  Office follow-up after MRI scan. Follow-Up Instructions: No Follow-up on file.   Orders:  Orders Placed This Encounter  Procedures  . MR Lumbar Spine w/o contrast   No orders of the defined types were placed in this encounter.     Procedures: No procedures performed   Clinical Data: No additional findings.   Subjective: Chief Complaint  Patient presents with  . Lower Back - Follow-up  . Right Wrist - Follow-up    HPI 84 year old male returns states he is continuing to have problems with back pain difficulty standing difficulty walking been using one half of a Vicodin tablet  3 times a day.  Relates he puts his hands on his thighs forward in flexion relief.  He does better when he uses his walker which he does not have today states leaning forward helps his pain.  He does well with a grocery cart.  Full extension standing straight up he states he has more pain.  Sitting position he frequently pushes himself up with his arms unload his lumbar spine and states this gives him some relief.  He gets relief with supine position puts pillow between his legs and sometimes has to shift positions to get comfortable.  States his wrist pain in the right wrist is significantly improved.  Previous x-rays show bone-on-bone changes right hip.  Pain with ambulation is anteriorly from but he also has pain that radiates down to his ankles with prolonged standing and walking.  Right total hip arthroplasty has been discussed previously. Review of Systems 14 point review of systems is updated unchanged from 07/30/2017 other than as mentioned in HPI.   Objective: Vital Signs: BP 119/78   Pulse 75   Ht 5\' 10"  (1.778 m)   Wt 191 lb (86.6 kg)   BMI 27.41 kg/m   Physical Exam  Constitutional: He is oriented to person, place, and time. He appears well-developed and well-nourished.  HENT:  Head: Normocephalic and atraumatic.  Eyes: EOM are normal. Pupils are equal, round, and reactive to light.  Neck: No tracheal deviation  present. No thyromegaly present.  Cardiovascular: Normal rate.  Pulmonary/Chest: Effort normal. He has no wheezes.  Abdominal: Soft. Bowel sounds are normal.  Neurological: He is alert and oriented to person, place, and time.  Skin: Skin is warm and dry. Capillary refill takes less than 2 seconds.  Psychiatric: He has a normal mood and affect. His behavior is normal. Judgment and thought content normal.    Ortho Exam bilateral buttocks tenderness.  Mild tenderness over the trochanters no ecchymosis.  Knee and ankle jerk are intact distal pulses are palpable anterior tib  EHL quads hamstrings are strong.  He has pain with hip range of motion worse on the right than left.  10 degrees internal rotation right hip with right groin pain.  External rotation 30 degrees with pain no hip flexion Specialty Comments:  No specialty comments available.  Imaging: No results found.   PMFS History: Patient Active Problem List   Diagnosis Date Noted  . Nervousness 10/16/2017  . CAD (coronary artery disease) 12/28/2015  . BMI 28.0-28.9,adult 08/21/2015  . Encounter for Medicare annual wellness exam 04/27/2015  . Vitamin D deficiency 03/04/2014  . Medication management 02/17/2014  . ASHD (arteriosclerotic heart disease)   . Depression, major, in remission ([place])   . Diverticulosis   . Rectus sheath hematoma 02/11/2014  . Personal history of colonic polyps 12/29/2012  . Hypertension   . Pre-diabetes   . Mixed hyperlipidemia   . OSA (obstructive sleep apnea)   . Atrial fibrillation [facility])    Past Medical History:  Diagnosis Date  . ASHD (arteriosclerotic heart disease)   . Atrial fib/flutter, transient    off coumadin since 11/2013- CHADSVAC of 2  . Colon polyps    2003 hyperplastic and tubuler adenoma  . Depression   . Diverticulosis   . Dyslipidemia   . Hypertension   . OSA (obstructive sleep apnea)   . Pre-diabetes   . Rectus sheath hematoma 11/21/2013   Dr. Deatra Ina     Family History  Problem Relation Age of Onset  . Alzheimer's disease Mother   . Kidney cancer Father   . Diabetes Brother   . Breast cancer Sister   . Rectal cancer Sister   . Breast cancer Daughter   . Ulcerative colitis Sister   . Heart disease Brother   . Heart disease Sister     Past Surgical History:  Procedure Laterality Date  . HERNIA REPAIR     Social History   Occupational History  . Occupation: Retired  Tobacco Use  . Smoking status: Former Smoker    Last attempt to quit: 09/14/1993    Years since quitting: 24.1  . Smokeless tobacco: Never Used  Substance and  Sexual Activity  . Alcohol use: No  . Drug use: No  . Sexual activity: Not on file

## 2017-10-31 LAB — URINE CULTURE
MICRO NUMBER:: 90067808
Result:: NO GROWTH
SPECIMEN QUALITY:: ADEQUATE

## 2017-11-08 ENCOUNTER — Ambulatory Visit
Admission: RE | Admit: 2017-11-08 | Discharge: 2017-11-08 | Disposition: A | Discharge status: Home / Self Care | Payer: Self-pay | Source: Ambulatory Visit | Attending: Orthopaedic Surgery | Admitting: Orthopaedic Surgery

## 2017-11-08 DIAGNOSIS — M5441 Lumbago with sciatica, right side: Secondary | ICD-10-CM

## 2017-11-08 DIAGNOSIS — M5442 Lumbago with sciatica, left side: Secondary | ICD-10-CM

## 2017-11-08 DIAGNOSIS — M48061 Spinal stenosis, lumbar region without neurogenic claudication: Secondary | ICD-10-CM | POA: Diagnosis not present

## 2017-11-13 ENCOUNTER — Encounter (INDEPENDENT_AMBULATORY_CARE_PROVIDER_SITE_OTHER): Payer: Self-pay | Admitting: Orthopaedic Surgery

## 2017-11-13 ENCOUNTER — Ambulatory Visit (INDEPENDENT_AMBULATORY_CARE_PROVIDER_SITE_OTHER): Payer: Medicare HMO | Admitting: Orthopaedic Surgery

## 2017-11-13 VITALS — BP 114/75 | HR 81

## 2017-11-13 DIAGNOSIS — M25551 Pain in right hip: Secondary | ICD-10-CM

## 2017-11-13 DIAGNOSIS — G8929 Other chronic pain: Secondary | ICD-10-CM

## 2017-11-13 DIAGNOSIS — M545 Low back pain: Secondary | ICD-10-CM | POA: Diagnosis not present

## 2017-11-13 DIAGNOSIS — M1611 Unilateral primary osteoarthritis, right hip: Secondary | ICD-10-CM | POA: Diagnosis not present

## 2017-11-13 MED ORDER — HYDROCODONE-ACETAMINOPHEN 5-325 MG PO TABS: 1.0000 | ORAL_TABLET | Freq: Two times a day (BID) | ORAL | 0 refills | Status: DC | PRN

## 2017-11-13 MED ORDER — HYDROCODONE-ACETAMINOPHEN 5-325 MG PO TABS: ORAL_TABLET | ORAL | 0 refills | Status: DC

## 2017-11-13 NOTE — Progress Notes (Signed)
Office Visit Note   Patient: [name] Yu           Date of Birth: [birth year]           MRN: [id number] Visit Date: 11/13/2017              Requested by: [name], [address] [place], [place] [address] PCP: [name], MD   Assessment & Plan: Visit Diagnoses:  1. Pain in right hip   2. Chronic bilateral low back pain, with sciatica presence unspecified   3. Unilateral primary osteoarthritis, right hip     Plan: Today we discussed and reviewed his MRI scan previous x-rays lumbar spine and also of his hips.  He has some moderate arthritis left hip and severe hip arthritis of the right hip with bone-on-bone changes.  I would recommend an intra-articular injection right hip and also a left L5-S1 injection for his disc protrusion.  Plan to see him back again in 6 weeks.  Long discussion about chronic narcotic usage for chronic pain today with patient and his family members.  One half of a Norco tablet when he has increased pain taking them regularly on a daily basis sometimes  He can make it almost 12 hours other times only 6 hours. New Rx given since he states he cannot function  without it.  We discussed operative treatment for both his back as well as his hip ,currently seems his right severe hip osteoarthritis is his primary pain generator.  I will recheck him in 6 weeks after he had his injection and see how he is doing.  Discussed direct anterior right  total hip arthroplasty if he does not get sustained relief from the injection.  Follow-Up Instructions: Return in about 6 weeks (around 12/25/2017).   Orders:  Orders Placed This Encounter  Procedures  . Ambulatory referral to Physical Medicine Rehab   Meds ordered this encounter  Medications  . DISCONTD: HYDROcodone-acetaminophen (NORCO) 5-325 MG tablet    Sig: Take 1-2 tablets by mouth 2 (two) times daily as needed.    Dispense:  30 tablet    Refill:  0  . HYDROcodone-acetaminophen (NORCO)  5-325 MG tablet    Sig: Take 1/2 tablet twice daily as needed for pain.    Dispense:  30 tablet    Refill:  0      Procedures: No procedures performed   Clinical Data: No additional findings.   Subjective: Chief Complaint  Patient presents with  . Lower Back - Pain, Follow-up    MRI review    HPI 84 year old male returns with ongoing problems with right hip osteoarthritis with bone-on-bone changes.  He has difficulty walking at times his pain is severe he is taking one half hydrocodone he states he must have a new prescription because he cannot handle the pain.  He also at times has pain on the left side where MRI scan shows left lateral recess stenosis and foraminal stenosis due to a broad-based paracentral disc protrusion.  Patient's plain radiographs show significant spondylosis at other levels large endplate spurs throughout the lumbar spine.  No other levels show severe stenosis however, only moderate narrowing.  Patient sitting in a wheelchair.  He states when his pain gets bad usually in the right hip sometimes lateral left buttocks radiating down his left leg down to his left foot he states he cannot walk.   Here today with four male family members.  Previous short-term relief with a trochanteric  injection in the right hip.  Review of Systems 14 point review of systems updated unchanged from 07/30/2017 other than as mentioned in HPI.  He has fallen due to giving way of his right hip.   Objective: Vital Signs: BP 114/75   Pulse 81   Physical Exam  Constitutional: He is oriented to person, place, and time. He appears well-developed and well-nourished.  HENT:  Head: Normocephalic and atraumatic.  Eyes: EOM are normal. Pupils are equal, round, and reactive to light.  Neck: No tracheal deviation present. No thyromegaly present.  Cardiovascular: Normal rate.  Pulmonary/Chest: Effort normal. He has no wheezes.  Abdominal: Soft. Bowel sounds are normal.  Neurological: He is  alert and oriented to person, place, and time.  Skin: Skin is warm and dry. Capillary refill takes less than 2 seconds.  Psychiatric: He has a normal mood and affect. His behavior is normal. Judgment and thought content normal.    Ortho Exam positive straight leg raising on the left at 70 degrees.  He has mild discomfort with internal/external rotation left hip limited to 20 degrees internal rotation.  He has severe pain right groin with internal rotation right hip of only 10 degrees reproducing his sharp pain that he has when he ambulates.  Specialty Comments:  No specialty comments available.  Imaging: Study Result   CLINICAL DATA:  Low back and bilateral leg pain injury since October, 2018. No known injury.  EXAM: MRI LUMBAR SPINE WITHOUT CONTRAST  TECHNIQUE: Multiplanar, multisequence MR imaging of the lumbar spine was performed. No intravenous contrast was administered.  COMPARISON:  Two views of the lumbar spine performed at [facility] 09/26/2017.  FINDINGS: Segmentation:  Standard.  Alignment: There is mild reversal of the normal lumbar lordosis with associated 0.5 cm retrolisthesis L3 on L4 and trace retrolisthesis L4 on L5.  Vertebrae:  No fracture or worrisome lesion.  Conus medullaris and cauda equina: Conus extends to the T12 level. Conus and cauda equina appear normal.  Paraspinal and other soft tissues: Negative.  Disc levels:  T11-12 is imaged in the sagittal plane only and negative.  T12-L1: Negative.  L1-2: Moderate facet arthropathy is present. There is a shallow right paracentral protrusion and small bulge. Mild central canal narrowing is seen. The foramina are open. No nerve root compression is identified.  L2-3: Moderate facet degenerative change and a shallow disc bulge. Bulky paravertebral endplate spurring is identified. The central canal and foramina remain open.  L3-4: The patient has a disc bulge with a  superimposed central disc protrusion with some caudal extension. Facet degenerative change is present. There is moderate central canal stenosis with some narrowing in the subarticular recesses, worse on the left. Mild to moderate foraminal narrowing is also worse on the left. Bulky paravertebral endplate spurring is worse on the right.  L4-5: Small central annular fissure and a shallow broad-based disc bulge with some endplate spur. Mild facet degenerative change. The central canal is open. Mild to moderate foraminal narrowing is worse on the left. There is some paravertebral endplate spurring which is more notable on the left.  L5-S1: Broad-based left paracentral and subarticular recess protrusion deforms left aspect of the thecal sac and causes narrowing in the subarticular recesses, much worse on the left. Moderately severe to severe foraminal narrowing is also worse on the left.  IMPRESSION: Moderate central canal narrowing left greater than right subarticular recess narrowing at L3-4. Either descending L4 root could be irritated.  Moderately severe to severe foraminal narrowing at  L5-S1 is worse on the left and could impact either exiting L5 root. There is also left worse than right subarticular recess narrowing at this level which could impact either descending S1 root.  Mild to moderate bilateral foraminal narrowing at L4-5 due to disc and facet arthropathy is worse on the left.  Small bulge and shallow right paracentral protrusion at L1-2 cause mild central canal narrowing but no nerve root compression is identified.   Electronically Signed   By: Inge Rise M.D.   On: 11/08/2017 13:15       PMFS History: Patient Active Problem List   Diagnosis Date Noted  . Nervousness 10/16/2017  . CAD (coronary artery disease) 12/28/2015  . BMI 28.0-28.9,adult 08/21/2015  . Encounter for Medicare annual wellness exam 04/27/2015  . Vitamin D deficiency  03/04/2014  . Medication management 02/17/2014  . ASHD (arteriosclerotic heart disease)   . Depression, major, in remission ([place])   . Diverticulosis   . Rectus sheath hematoma 02/11/2014  . Personal history of colonic polyps 12/29/2012  . Hypertension   . Pre-diabetes   . Mixed hyperlipidemia   . OSA (obstructive sleep apnea)   . Atrial fibrillation [facility])    Past Medical History:  Diagnosis Date  . ASHD (arteriosclerotic heart disease)   . Atrial fib/flutter, transient    off coumadin since 11/2013- CHADSVAC of 2  . Colon polyps    2003 hyperplastic and tubuler adenoma  . Depression   . Diverticulosis   . Dyslipidemia   . Hypertension   . OSA (obstructive sleep apnea)   . Pre-diabetes   . Rectus sheath hematoma 11/21/2013   Dr. Deatra Ina     Family History  Problem Relation Age of Onset  . Alzheimer's disease Mother   . Kidney cancer Father   . Diabetes Brother   . Breast cancer Sister   . Rectal cancer Sister   . Breast cancer Daughter   . Ulcerative colitis Sister   . Heart disease Brother   . Heart disease Sister     Past Surgical History:  Procedure Laterality Date  . HERNIA REPAIR     Social History   Occupational History  . Occupation: Retired  Tobacco Use  . Smoking status: Former Smoker    Last attempt to quit: 09/14/1993    Years since quitting: 24.1  . Smokeless tobacco: Never Used  Substance and Sexual Activity  . Alcohol use: No  . Drug use: No  . Sexual activity: Not on file

## 2017-11-27 ENCOUNTER — Other Ambulatory Visit: Payer: Self-pay

## 2017-11-27 DIAGNOSIS — Z1212 Encounter for screening for malignant neoplasm of rectum: Principal | ICD-10-CM

## 2017-11-27 DIAGNOSIS — Z1211 Encounter for screening for malignant neoplasm of colon: Secondary | ICD-10-CM

## 2017-11-27 LAB — POC HEMOCCULT BLD/STL (HOME/3-CARD/SCREEN)
Card #3 Fecal Occult Blood, POC: NEGATIVE
FECAL OCCULT BLD: NEGATIVE
Fecal Occult Blood, POC: NEGATIVE

## 2017-11-28 DIAGNOSIS — Z1212 Encounter for screening for malignant neoplasm of rectum: Secondary | ICD-10-CM | POA: Diagnosis not present

## 2017-12-02 ENCOUNTER — Encounter (INDEPENDENT_AMBULATORY_CARE_PROVIDER_SITE_OTHER): Payer: Self-pay | Admitting: Physical Medicine and Rehabilitation

## 2017-12-02 ENCOUNTER — Ambulatory Visit (INDEPENDENT_AMBULATORY_CARE_PROVIDER_SITE_OTHER): Payer: Medicare HMO

## 2017-12-02 ENCOUNTER — Ambulatory Visit (INDEPENDENT_AMBULATORY_CARE_PROVIDER_SITE_OTHER): Payer: Medicare HMO | Admitting: Physical Medicine and Rehabilitation

## 2017-12-02 VITALS — BP 123/78 | HR 74 | Temp 97.6°F

## 2017-12-02 DIAGNOSIS — M5416 Radiculopathy, lumbar region: Secondary | ICD-10-CM

## 2017-12-02 DIAGNOSIS — M25551 Pain in right hip: Secondary | ICD-10-CM

## 2017-12-02 DIAGNOSIS — M25552 Pain in left hip: Secondary | ICD-10-CM | POA: Diagnosis not present

## 2017-12-02 DIAGNOSIS — M4316 Spondylolisthesis, lumbar region: Secondary | ICD-10-CM | POA: Diagnosis not present

## 2017-12-02 DIAGNOSIS — M48062 Spinal stenosis, lumbar region with neurogenic claudication: Secondary | ICD-10-CM

## 2017-12-02 MED ORDER — HYDROCODONE-ACETAMINOPHEN 5-325 MG PO TABS: ORAL_TABLET | ORAL | 0 refills | Status: DC

## 2017-12-02 MED ORDER — METHYLPREDNISOLONE ACETATE 80 MG/ML IJ SUSP
80.0000 mg | Freq: Once | INTRAMUSCULAR | Status: AC
  Administered 2017-12-02: 80 mg

## 2017-12-02 NOTE — Patient Instructions (Signed)

## 2017-12-02 NOTE — Progress Notes (Signed)
[name] Yu - 84 y.o. male MRN [id number]  Date of birth: 10/24/1926  Office Visit Note: Visit Date: 12/02/2017 PCP: [name], MD Referred by: [name], MD  Subjective: Chief Complaint  Patient presents with  . Lower Back - Pain  . Right Hip - Pain  . Left Hip - Pain   HPI: [name] Yu is an 84 year old gentleman who comes in today at the request of Dr. [name] for evaluation and management of his back and hip pain bilaterally.  He is accompanied by his wife who provides some of the history.  The patient is in 6 treatment amount of pain today.  He says he has difficulty standing and moving and getting in and out of the car.  He reports excruciating amount of pain that wraps around both hips into the anterior groin area anteriorly in the thighs and also anterior laterally down to the knee.  He gets some pain in an L5 distribution as well to the ankles more on the right than left.  He reports that his leg will give out as he is walking at times.  This does not happen very often.  He is in a wheelchair today and very uncomfortable.  He does perseverate quite a bit about not getting pain medications and he is about out.  With his hydrocodone which he takes half a pill at a time he can sometimes get 5 hours of decent relief or he could move around.  Other times he does not use it.  His history is such that back in September he had acute onset of back pain and ultimately was even seen in the emergency department at the [facility] and was diagnosed with sciatica.  He has had issues with his back at different times.  He then started seeing Dr. [name] who noted that he had severe osteoarthritis of the right hip and degenerative disease of the left hip as well.  The greater trochanteric injection in the fall seem to help quite a bit.  The patient still continued with Vicodin at that point.  He has had some therapy.  He had a fall onto the left side and was actually seen by Dr. Erlinda Hong in the office.   This was felt to be just traumatic and there were no fractures.  Overall today's not doing well in terms of his pain.  He does get some relief with lying down and pulling his legs up.  He has had no prior lumbar surgery.  He has had an MRI of the lumbar spine which is reviewed below.  This does show moderate stenosis at L3-4 with multilevel facet arthropathy.  There is a small amount of retrolisthesis of L3 on L4.  Dr. [name] was thinking about epidural injection and joint injection.    Review of Systems  Constitutional: Negative for chills, fever, malaise/fatigue and weight loss.  HENT: Negative for hearing loss and sinus pain.   Eyes: Negative for blurred vision, double vision and photophobia.  Respiratory: Negative for cough and shortness of breath.   Cardiovascular: Negative for chest pain, palpitations and leg swelling.  Gastrointestinal: Negative for abdominal pain, nausea and vomiting.  Genitourinary: Negative for flank pain.  Musculoskeletal: Positive for back pain and joint pain. Negative for myalgias.  Skin: Negative for itching and rash.  Neurological: Negative for tremors, focal weakness and weakness.  Endo/Heme/Allergies: Negative.   Psychiatric/Behavioral: Negative for depression.  All other systems reviewed and are negative.  Otherwise per HPI.  Assessment &  Plan: Visit Diagnoses:  1. Pain in right hip   2. Lumbar radiculopathy   3. Pain in left hip   4. Spinal stenosis of lumbar region with neurogenic claudication   5. Spondylolisthesis of lumbar region     Plan: Findings:  Severe bilateral hip and leg pain.  Endorses back pain as well.  Somewhat of a poor historian as his pain is really dramatically worsened this morning.  He consistently has symptoms that radiate into the anterior hip area and down into the anterior thigh is worse with movement.  On exam he has significant pain with hip rotation today bilaterally right more than left.  He has good distal strength.  Any  sort of movement at this point today seems to be exaggerated pain.  I really think the best diagnostic plan at this point is bilateral intra-articular hip injection with fluoroscopic guidance.  If he gets a lot of relief during the anesthetic phase and I think this is a good diagnosis that his hips are really more of the issue.  He clearly could have some radicular component with the stenosis particularly into the thighs being there is an L3-4.  Obviously he could have both problems.  I will refill his Vicodin one time and one time only and this can be managed by Dr. [name].  I did discuss with him that the medication is probably not going to be something that is beneficial to him long-term.  The Vicodin may in fact be the reason his pain level is so high as there can be a general sensitization to chronic usage.  Would be happy to see him back for epidural injection which would probably be by the lateral L3 transforaminal injections future depending on how he does.    Meds & Orders:  Meds ordered this encounter  Medications  . methylPREDNISolone acetate (DEPO-MEDROL) injection 80 mg  . HYDROcodone-acetaminophen (NORCO) 5-325 MG tablet    Sig: Take 1/2 tablet twice daily as needed for pain.    Dispense:  30 tablet    Refill:  0    Orders Placed This Encounter  Procedures  . Large Joint Inj: bilateral hip joint  . XR C-ARM NO REPORT    Follow-up: Return for Dr. [name].   Procedures: Large Joint Inj: bilateral hip joint on 12/02/2017 11:07 AM Indications: diagnostic evaluation and pain Details: 22 G 3.5 in needle, fluoroscopy-guided anterior approach  Arthrogram: No  Medications (Right): 3 mL lidocaine (PF) 1 %; 60 mg triamcinolone acetonide 40 MG/ML Medications (Left): 3 mL lidocaine (PF) 1 %; 60 mg triamcinolone acetonide 40 MG/ML Outcome: tolerated well, no immediate complications  There was excellent flow of contrast producing a partial arthrogram of the hip. The patient did have relief  of symptoms during the anesthetic phase of the injection.  Was able to rotate his hips internally with still decreased range of motion but he did not have significant pain like he did prior to the injection. Procedure, treatment alternatives, risks and benefits explained, specific risks discussed. Consent was given by the patient. Immediately prior to procedure a time out was called to verify the correct patient, procedure, equipment, support staff and site/side marked as required. Patient was prepped and draped in the usual sterile fashion.      No notes on file   Clinical History: MRI LUMBAR SPINE WITHOUT CONTRAST  TECHNIQUE: Multiplanar, multisequence MR imaging of the lumbar spine was performed. No intravenous contrast was administered.  COMPARISON: Two views of the lumbar spine  performed at [facility] 09/26/2017.  FINDINGS: Segmentation: Standard.  Alignment: There is mild reversal of the normal lumbar lordosis with associated 0.5 cm retrolisthesis L3 on L4 and trace retrolisthesis L4 on L5.  Vertebrae: No fracture or worrisome lesion.  Conus medullaris and cauda equina: Conus extends to the T12 level. Conus and cauda equina appear normal.  Paraspinal and other soft tissues: Negative.  Disc levels:  T11-12 is imaged in the sagittal plane only and negative.  T12-L1: Negative.  L1-2: Moderate facet arthropathy is present. There is a shallow right paracentral protrusion and small bulge. Mild central canal narrowing is seen. The foramina are open. No nerve root compression is identified.  L2-3: Moderate facet degenerative change and a shallow disc bulge. Bulky paravertebral endplate spurring is identified. The central canal and foramina remain open.  L3-4: The patient has a disc bulge with a superimposed central disc protrusion with some caudal extension. Facet degenerative change is present. There is moderate central canal stenosis with  some narrowing in the subarticular recesses, worse on the left. Mild to moderate foraminal narrowing is also worse on the left. Bulky paravertebral endplate spurring is worse on the right.  L4-5: Small central annular fissure and a shallow broad-based disc bulge with some endplate spur. Mild facet degenerative change. The central canal is open. Mild to moderate foraminal narrowing is worse on the left. There is some paravertebral endplate spurring which is more notable on the left.  L5-S1: Broad-based left paracentral and subarticular recess protrusion deforms left aspect of the thecal sac and causes narrowing in the subarticular recesses, much worse on the left. Moderately severe to severe foraminal narrowing is also worse on the left.  IMPRESSION: Moderate central canal narrowing left greater than right subarticular recess narrowing at L3-4. Either descending L4 root could be irritated.  Moderately severe to severe foraminal narrowing at L5-S1 is worse on the left and could impact either exiting L5 root. There is also left worse than right subarticular recess narrowing at this level which could impact either descending S1 root.  Mild to moderate bilateral foraminal narrowing at L4-5 due to disc and facet arthropathy is worse on the left.  Small bulge and shallow right paracentral protrusion at L1-2 cause mild central canal narrowing but no nerve root compression is identified.   Electronically Signed By: Inge Rise M.D. On: 11/08/2017 13:15  Interface, Rad Results In - 07/01/2017  7:17 AM EDT XR HIP RIGHT 2 VIEWS PLUS AP PELVIS Number of views: 3.  Indication:  HIP PAIN, M25.551 Pain in right hip.  Comparison: None  Findings: No fracture is seen. Normal alignment.  Degenerative joint disease of the right hip with severe superior joint space narrowing and subchondral sclerosis. Degenerative changes of the lower lumbar spine. Visualized soft tissues are  unremarkable.   Impression:  1. No acute fracture or dislocation. 2. Moderate to severe DJD of right hip.  He reports that he quit smoking about 24 years ago. he has never used smokeless tobacco.  Recent Labs    12/27/16 1526 04/08/17 1432 10/29/17 1050  HGBA1C 5.3 5.5 5.5    Objective:  VS:  HT:    WT:   BMI:     BP:123/78  HR:74bpm  TEMP:97.6 F (36.4 C)(Oral)  RESP:96 % Physical Exam  Constitutional: He is oriented to person, place, and time. He appears well-developed and well-nourished. No distress.  HENT:  Head: Normocephalic and atraumatic.  Nose: Nose normal.  Mouth/Throat: Oropharynx is clear and moist.  Eyes:  Conjunctivae are normal. Pupils are equal, round, and reactive to light.  Neck: Normal range of motion. Neck supple. No tracheal deviation present.  Cardiovascular: Regular rhythm and intact distal pulses.  Pulmonary/Chest: Effort normal and breath sounds normal.  Abdominal: Soft. He exhibits no distension. There is no rebound and no guarding.  Musculoskeletal: He exhibits no deformity.  Patient having a great deal difficulty standing from a seated position without a lot of pain.  He has painful range of motion of the hips with internal rotation.  This is exquisite reproductive pain of his current symptoms.  He has a negative slump test bilaterally.  He is tender to palpation really anywhere over the greater trochanters on both sides but also around the PSIS.  It is greatly exaggerated to any movement.  He has no clonus.  Neurological: He is alert and oriented to person, place, and time. He exhibits normal muscle tone. Coordination normal.  Skin: Skin is warm. No rash noted.  Psychiatric: He has a normal mood and affect. His behavior is normal.  Nursing note and vitals reviewed.   Ortho Exam Imaging: Xr C-arm No Report  Result Date: 12/02/2017 Please see Notes or Procedures tab for imaging impression.   Past Medical/Family/Surgical/Social  History: Medications & Allergies reviewed per EMR Patient Active Problem List   Diagnosis Date Noted  . Nervousness 10/16/2017  . CAD (coronary artery disease) 12/28/2015  . BMI 28.0-28.9,adult 08/21/2015  . Encounter for Medicare annual wellness exam 04/27/2015  . Vitamin D deficiency 03/04/2014  . Medication management 02/17/2014  . ASHD (arteriosclerotic heart disease)   . Depression, major, in remission ([place])   . Diverticulosis   . Rectus sheath hematoma 02/11/2014  . Personal history of colonic polyps 12/29/2012  . Hypertension   . Pre-diabetes   . Mixed hyperlipidemia   . OSA (obstructive sleep apnea)   . Atrial fibrillation [facility])    Past Medical History:  Diagnosis Date  . ASHD (arteriosclerotic heart disease)   . Atrial fib/flutter, transient    off coumadin since 11/2013- CHADSVAC of 2  . Colon polyps    2003 hyperplastic and tubuler adenoma  . Depression   . Diverticulosis   . Dyslipidemia   . Hypertension   . OSA (obstructive sleep apnea)   . Pre-diabetes   . Rectus sheath hematoma 11/21/2013   Dr. Deatra Ina    Family History  Problem Relation Age of Onset  . Alzheimer's disease Mother   . Kidney cancer Father   . Diabetes Brother   . Breast cancer Sister   . Rectal cancer Sister   . Breast cancer Daughter   . Ulcerative colitis Sister   . Heart disease Brother   . Heart disease Sister    Past Surgical History:  Procedure Laterality Date  . HERNIA REPAIR     Social History   Occupational History  . Occupation: Retired  Tobacco Use  . Smoking status: Former Smoker    Last attempt to quit: 09/14/1993    Years since quitting: 24.2  . Smokeless tobacco: Never Used  Substance and Sexual Activity  . Alcohol use: No  . Drug use: No  . Sexual activity: Not on file

## 2017-12-02 NOTE — Progress Notes (Deleted)
Pt states pain in lower back that radiates to both right and left leg. Pt states a fall in November and that's when the pain began. Pt states walking makes the pain worse, laying down makes the pain better. +Driver, -BT, -Dye Allergies.

## 2017-12-03 ENCOUNTER — Encounter (INDEPENDENT_AMBULATORY_CARE_PROVIDER_SITE_OTHER): Payer: Self-pay | Admitting: Physical Medicine and Rehabilitation

## 2017-12-03 MED ORDER — TRIAMCINOLONE ACETONIDE 40 MG/ML IJ SUSP
60.0000 mg | INTRAMUSCULAR | Status: AC | PRN
Start: 2017-12-02 — End: 2017-12-02
  Administered 2017-12-02: 60 mg via INTRA_ARTICULAR

## 2017-12-03 MED ORDER — LIDOCAINE HCL (PF) 1 % IJ SOLN
3.0000 mL | INTRAMUSCULAR | Status: AC | PRN
  Administered 2017-12-02: 3 mL

## 2017-12-03 MED ORDER — TRIAMCINOLONE ACETONIDE 40 MG/ML IJ SUSP
60.0000 mg | INTRAMUSCULAR | Status: AC | PRN
  Administered 2017-12-02: 60 mg via INTRA_ARTICULAR

## 2017-12-17 ENCOUNTER — Encounter (INDEPENDENT_AMBULATORY_CARE_PROVIDER_SITE_OTHER): Payer: Self-pay | Admitting: Orthopaedic Surgery

## 2017-12-17 ENCOUNTER — Ambulatory Visit (INDEPENDENT_AMBULATORY_CARE_PROVIDER_SITE_OTHER): Payer: Medicare HMO | Admitting: Orthopaedic Surgery

## 2017-12-17 VITALS — BP 109/73 | HR 70

## 2017-12-17 DIAGNOSIS — R269 Unspecified abnormalities of gait and mobility: Secondary | ICD-10-CM | POA: Diagnosis not present

## 2017-12-17 DIAGNOSIS — M1611 Unilateral primary osteoarthritis, right hip: Secondary | ICD-10-CM

## 2017-12-17 MED ORDER — HYDROCODONE-ACETAMINOPHEN 5-325 MG PO TABS: ORAL_TABLET | ORAL | 0 refills | Status: DC

## 2017-12-17 NOTE — Progress Notes (Signed)
Office Visit Note   Patient: [name] Yu           Date of Birth: 1927-07-27           MRN: [id number] Visit Date: 12/17/2017              Requested by: [name], [address], [place] [id number] PCP: [name], MD   Assessment & Plan: Visit Diagnoses:  1. Unilateral primary osteoarthritis, right hip     Plan: Patient has some lumbar disc degeneration at L4-5 and L5-S1 but more narrowing on the left side.  He has pain with ambulation with bone-on-bone changes in his right hip and exam demonstrates pain reproduced similar to those pain with ambulation with internal rotation of his hip consistent with severe right hip osteoarthritis.  Discussed options, right total hip arthroplasty.  He will need preoperative cardiology clearance.  He had his injection and needs to wait 3 months before his hip arthroplasty which would be mid May after the 15th.  We discussed the relationship with intra-articular injection and increased postoperative infection right neck for a time period of 3 months from the injection.  We will need preoperative cardiology clearance with his cardiac history and plan would be direct anterior total hip arthroplasty with 2 night stay in the hospital.  Procedure discussed.  His wife was present questions were elicited and answered.  Operative technique discussed including risks of surgery which include femur fracture, infection, revision surgery, cardiac problems, risk of anesthesia.  His mobility has decreased with his severe hip osteoarthritis and he continues to require some narcotic medication to handle his pain.  Patient and his wife state they would like to proceed with right total hip arthroplasty.  Prescription for Norco renewed 1/2 tablet daily as needed.  Follow-Up Instructions: No Follow-up on file.   Orders:  No orders of the defined types were placed in this encounter.  Meds ordered this encounter  Medications  .  HYDROcodone-acetaminophen (NORCO) 5-325 MG tablet    Sig: Take 1/2 tablet twice daily as needed for pain.    Dispense:  30 tablet    Refill:  0      Procedures: No procedures performed   Clinical Data: No additional findings.   Subjective: Chief Complaint  Patient presents with  . Right Hip - Pain, Follow-up  . Left Hip - Pain, Follow-up    HPI patient returns is ambulatory with a cane.  With Dr. Ernestina Patches without pain severe as it was before the injection.  Previous x-rays showed severe bone-on-bone changes right hip with mild to moderate changes in his left hip.  The patient has been weaning his pain medication.  He lives in [place] and his wife is keeping track of the count of tablets.  1/2 tablet only as needed twice daily.  Patient states that cortisone injection is wearing off and now is having increased right anterior groin pain with weightbearing.  Pain with rotation of his hip.  Using the cane and I recommend he try using it in the opposite left hand and we went over appropriate gait sequencing.  Review of Systems 14 point review of systems positive for prediabetes, atrial fibrillation.,  Depression, sleep apnea, coronary artery disease, right hip osteoarthritis.  He has a cardiologist and [place] with [facility].   Objective: Vital Signs: BP 109/73   Pulse 70   Physical Exam  Constitutional: He is oriented to person, place, and time. He appears well-developed  and well-nourished.  HENT:  Head: Normocephalic and atraumatic.  Eyes: EOM are normal. Pupils are equal, round, and reactive to light.  Neck: No tracheal deviation present. No thyromegaly present.  Cardiovascular: Normal rate.  Pulmonary/Chest: Effort normal. He has no wheezes.  Abdominal: Soft. Bowel sounds are normal.  Neurological: He is alert and oriented to person, place, and time.  Skin: Skin is warm and dry. Capillary refill takes less than 2 seconds.  Psychiatric: He has a normal mood and  affect. His behavior is normal. Judgment and thought content normal.    Ortho Exam patient has sharp pain with internal rotation of the right hip of 10 degrees.  Left hip externally rotates and internally rotates 20 degrees with mild pain.  Ambulates with a positive Trendelenburg gait.  No hip flexion contracture.  Distal pulses are palpable.  Mild sciatic notch tenderness on the left more than right.  Specialty Comments:  No specialty comments available.  Imaging: No results found.   PMFS History: Patient Active Problem List   Diagnosis Date Noted  . Nervousness 10/16/2017  . CAD (coronary artery disease) 12/28/2015  . BMI 28.0-28.9,adult 08/21/2015  . Encounter for Medicare annual wellness exam 04/27/2015  . Vitamin D deficiency 03/04/2014  . Medication management 02/17/2014  . ASHD (arteriosclerotic heart disease)   . Depression, major, in remission ([place])   . Diverticulosis   . Rectus sheath hematoma 02/11/2014  . Personal history of colonic polyps 12/29/2012  . Hypertension   . Pre-diabetes   . Mixed hyperlipidemia   . OSA (obstructive sleep apnea)   . Atrial fibrillation [facility])    Past Medical History:  Diagnosis Date  . ASHD (arteriosclerotic heart disease)   . Atrial fib/flutter, transient    off coumadin since 11/2013- CHADSVAC of 2  . Colon polyps    2003 hyperplastic and tubuler adenoma  . Depression   . Diverticulosis   . Dyslipidemia   . Hypertension   . OSA (obstructive sleep apnea)   . Pre-diabetes   . Rectus sheath hematoma 11/21/2013   Dr. Deatra Ina     Family History  Problem Relation Age of Onset  . Alzheimer's disease Mother   . Kidney cancer Father   . Diabetes Brother   . Breast cancer Sister   . Rectal cancer Sister   . Breast cancer Daughter   . Ulcerative colitis Sister   . Heart disease Brother   . Heart disease Sister     Past Surgical History:  Procedure Laterality Date  . HERNIA REPAIR     Social History   Occupational History    . Occupation: Retired  Tobacco Use  . Smoking status: Former Smoker    Last attempt to quit: 09/14/1993    Years since quitting: 24.2  . Smokeless tobacco: Never Used  Substance and Sexual Activity  . Alcohol use: No  . Drug use: No  . Sexual activity: Not on file

## 2017-12-18 ENCOUNTER — Encounter (INDEPENDENT_AMBULATORY_CARE_PROVIDER_SITE_OTHER): Payer: Self-pay | Admitting: Orthopaedic Surgery

## 2017-12-18 ENCOUNTER — Telehealth: Payer: Self-pay | Admitting: Cardiovascular Disease

## 2017-12-18 NOTE — Telephone Encounter (Signed)
   [facility] Pre-operative Risk Assessment    Request for surgical clearance:  1. What type of surgery is being performed? Right total hip arthroplasty  2. When is this surgery scheduled? Not listed  3. What type of clearance is required (medical clearance vs. Pharmacy clearance to hold med vs. Both)? Not listed  4. Are there any medications that need to be held prior to surgery and how long? Not listed  5. Practice name and name of physician performing surgery? [facility]  6. What is your office phone and fax number? [phone], fax [phone]  7. Anesthesia type (None, local, MAC, general) ? Not listed   Lucienne Minks 12/18/2017, 4:54 PM  _________________________________________________________________   (provider comments below)

## 2017-12-18 NOTE — Telephone Encounter (Signed)
No answer. Patient has not been seen since March 2018 and is scheduled for 01/02/18 with Dr Rockey Situ. Left detailed message, ok per DPR, that he will need to keep upcoming appointment for clearance of surgery and to call back if any further questions.

## 2017-12-22 ENCOUNTER — Encounter: Payer: Self-pay | Admitting: Cardiovascular Disease

## 2017-12-23 ENCOUNTER — Other Ambulatory Visit: Payer: Self-pay | Admitting: *Deleted

## 2017-12-23 MED ORDER — DILTIAZEM HCL 60 MG PO TABS: 60.0000 mg | ORAL_TABLET | Freq: Four times a day (QID) | ORAL | 0 refills | Status: DC | PRN

## 2017-12-25 ENCOUNTER — Ambulatory Visit (INDEPENDENT_AMBULATORY_CARE_PROVIDER_SITE_OTHER): Payer: Medicare HMO | Admitting: Orthopaedic Surgery

## 2017-12-31 NOTE — Progress Notes (Signed)
Cardiology Office Note  Date:  01/02/2018   ID:  [name] Yu, DOB 1927-08-17, MRN [id number]  PCP:  [name], MD   Chief Complaint  Patient presents with  . other    12 month follow up. Pt. needs a cardiac clearance for right total hip replacement. Meds reviewed by the pt. verbally.     HPI:  [name] Yu is a 84 yo gentleman with  Paroxysmal atrial fibrillation, 2007 at [facility], cardioversion Wife does not think much atrial fib in past 10 years no known coronary artery disease, HTN,  Hyperlipidemia sleep apnea,  prior pipe smoking for 20 years. bradycardia on calcium channel blockers and beta blockers,  presents for routine followup of his hypertension and atrial fibrillation.  Prior history of falls Previously followed at the [facility]   In follow-up today he denies any recent falls Denies any palpitations or tachycardia concerning for atrial fibrillation Tolerating all of his medications Takes diltiazem 60 twice daily Denies any orthostasis symptoms Review of previous notes over the past 6 months shows a trend down in his weight Also trend down and his blood pressure Frequent systolic pressures less than 110, again is asymptomatic  Scheduled to have right total hip replacement surgery with Dr. [name]  EKG personally reviewed by myself on todays visit Shows normal sinus rhythm with rate 76 bpm,  left axis deviation, poor R wave progression to the anterior precordial leads  Other past medical history reviewed Previous fall on last clinic visit, good recovery Workup detailed below (formerly on warfarin but d/c'd in 2014 for rectal sheath hematoma),  -CT brain: Study is mildly limited by motion. There is a subcutaneous hematoma in the soft tissues of the left frontotemporal region.  XR L foot: Nondisplaced fracture of the distal phalanx of the great toe.  Diagnosed withpost-concussive sx  Notes from the hospital indicatePer EMS, patient had a mechanical fall  earlier today. Tripped on a sock. [place] and hit his head on the washer machine. Denied LOC. Was GCS 15 on EMS arrival and while en route, patient became more sluggish and confused. States he had a 5/10 headache and was worried about a head injury. Denies any other injury.  Prior history of smoking for 30 years from age 48 to age  60 Previous CT scan reviewed with him from 2008 showing coronary calcifications   normal cath in 2003.  Normal stress test in 2005 at [facility].   07/2006 had Afib w/u at [facility].  [place] and on cardizem Dose decreased due to bradycardia Another normal stress test in 2007.    PMH:   has a past medical history of ASHD (arteriosclerotic heart disease), Atrial fib/flutter, transient, Colon polyps, Depression, Diverticulosis, Dyslipidemia, Hypertension, OSA (obstructive sleep apnea), Pre-diabetes, and Rectus sheath hematoma (11/21/2013).  PSH:    Past Surgical History:  Procedure Laterality Date  . HERNIA REPAIR      Current Outpatient Medications  Medication Sig Dispense Refill  . aspirin EC 81 MG tablet Take 1 tablet (81 mg total) by mouth daily. 90 tablet 3  . Calcium Carbonate Antacid (TUMS E-X PO) Take 2 tablets by mouth daily.    . Cholecalciferol 5000 UNITS TABS Take 1 tablet by mouth daily.    . citalopram (CELEXA) 40 MG tablet TAKE 1 TABLET BY MOUTH EVERY DAY 90 tablet 1  . diltiazem (CARDIZEM) 60 MG tablet Take 1 tablet (60 mg total) by mouth 4 (four) times daily as needed. Please keep upcoming appt for further refills  and clearance. 120 tablet 0  . finasteride (PROSCAR) 5 MG tablet TAKE 1 TABLET BY MOUTH EVERY DAY 90 tablet 3  . HYDROcodone-acetaminophen (NORCO) 5-325 MG tablet Take 1/2 tablet twice daily as needed for pain. 30 tablet 0  . LORazepam (ATIVAN) 2 MG tablet Take 1/2 to 1 tablet ONLY if needed at hour of sleep 90 tablet 0  . Magnesium 200 MG TABS Take 1 tablet by mouth daily.    . Multiple Vitamin (MULTIVITAMIN) capsule Take 1 capsule by mouth daily.     . Omega-3 Fatty Acids (FISH OIL PO) Take 1 tablet by mouth daily.    . vitamin C (ASCORBIC ACID) 500 MG tablet Take 1,000 mg by mouth daily.     No current facility-administered medications for this visit.      Allergies:   Gabapentin; Ciprofloxacin; Novocain [procaine hcl]; and Prednisone   Social History:  The patient  reports that he quit smoking about 24 years ago. He has never used smokeless tobacco. He reports that he does not drink alcohol or use drugs.   Family History:   family history includes Alzheimer's disease in his mother; Breast cancer in his daughter and sister; Diabetes in his brother; Heart disease in his brother and sister; Kidney cancer in his father; Rectal cancer in his sister; Ulcerative colitis in his sister.    Review of Systems: Review of Systems  Constitutional: Negative.        Fall, bruising to left side of his face  Respiratory: Negative.   Cardiovascular: Negative.   Gastrointestinal: Negative.   Musculoskeletal: Positive for joint pain.       Gait instability  Neurological: Negative.   Psychiatric/Behavioral: Negative.   All other systems reviewed and are negative.    PHYSICAL EXAM: VS:  BP 108/66 (BP Location: Left Arm, Patient Position: Sitting, Cuff Size: Normal)   Pulse 76   Ht 5\' 10"  (1.778 m)   Wt 187 lb (84.8 kg)   BMI 26.83 kg/m  , BMI Body mass index is 26.83 kg/m. Constitutional:  oriented to person, place, and time. No distress.  HENT:  Head: Normocephalic and atraumatic.  Eyes:  no discharge. No scleral icterus.  Neck: Normal range of motion. Neck supple. No JVD present.  Cardiovascular: Normal rate, regular rhythm, normal heart sounds and intact distal pulses. Exam reveals no gallop and no friction rub. No edema No murmur heard. Pulmonary/Chest: Effort normal and breath sounds normal. No stridor. No respiratory distress.  no wheezes.  no rales.  no tenderness.  Abdominal: Soft.  no distension.  no tenderness.   Musculoskeletal: Normal range of motion.  no  tenderness or deformity.  Neurological:  normal muscle tone. Coordination normal. No atrophy Skin: Skin is warm and dry. No rash noted. not diaphoretic.  Psychiatric:  normal mood and affect. behavior is normal. Thought content normal.    Recent Labs: 10/29/2017: ALT 12; BUN 12; Creat 1.04; Hemoglobin 15.1; Magnesium 2.1; Platelets 331; Potassium 4.4; Sodium 139; TSH 2.31    Lipid Panel Lab Results  Component Value Date   CHOL 191 10/29/2017   HDL 47 10/29/2017   LDLCALC 113 (H) 10/29/2017   TRIG 184 (H) 10/29/2017      Wt Readings from Last 3 Encounters:  01/02/18 187 lb (84.8 kg)  10/30/17 191 lb (86.6 kg)  10/29/17 192 lb 3.2 oz (87.2 kg)       ASSESSMENT AND PLAN:  Essential hypertension - Plan: EKG 12-Lead Long discussion concerning lower blood pressure Recommend  he check blood pressure at home especially orthostatics Wife will help.  If numbers drop will decrease diltiazem down to 30 twice daily  Mixed hyperlipidemia - Plan: EKG 12-Lead Currently not on a statin Recent weight loss 10 pounds over the past year per the patient  Paroxysmal atrial fibrillation ([place]) - Plan: EKG 12-Lead Long discussion, he denies any tachycardia concerning for arrhythmia Currently on low-dose aspirin Previous fall with trauma to the left temporal region Also previous hematoma on warfarin Wife and patient did not want anticoagulation Atrial fibrillation back in 2007, no clear documentation of atrial fibrillation since that time Wife reports occasional episodes but none recently We did discuss risk of stroke,  Again they do not want blood thinners  Coronary artery disease due to lipid rich plaque - Plan: EKG 12-Lead Currently with no symptoms of angina. No further workup at this time. Continue current medication regimen.  Stable  Preop cardiovascular evaluation For total hip replacement No further testing needed, acceptable risk Not  on anticoagulation  OSA (obstructive sleep apnea) - Plan: EKG 12-Lead Managed by primary care  Fall, subsequent encounter No recent falls  Disposition:   F/U  12 months   Total encounter time more than 25 minutes  Greater than 50% was spent in counseling and coordination of care with the patient    Orders Placed This Encounter  Procedures  . EKG 12-Lead     Signed, Esmond Plants, M.D., Ph.D. 01/02/2018  [place], [place]

## 2018-01-02 ENCOUNTER — Ambulatory Visit: Payer: Medicare HMO | Admitting: Cardiovascular Disease

## 2018-01-02 ENCOUNTER — Encounter: Payer: Self-pay | Admitting: Cardiovascular Disease

## 2018-01-02 VITALS — BP 108/66 | HR 76 | Ht 70.0 in | Wt 187.0 lb

## 2018-01-02 DIAGNOSIS — I10 Essential (primary) hypertension: Secondary | ICD-10-CM

## 2018-01-02 DIAGNOSIS — I480 Paroxysmal atrial fibrillation: Secondary | ICD-10-CM

## 2018-01-02 DIAGNOSIS — I2583 Coronary atherosclerosis due to lipid rich plaque: Secondary | ICD-10-CM

## 2018-01-02 DIAGNOSIS — E782 Mixed hyperlipidemia: Secondary | ICD-10-CM

## 2018-01-02 DIAGNOSIS — R69 Illness, unspecified: Secondary | ICD-10-CM | POA: Diagnosis not present

## 2018-01-02 DIAGNOSIS — W19XXXD Unspecified fall, subsequent encounter: Secondary | ICD-10-CM | POA: Diagnosis not present

## 2018-01-02 DIAGNOSIS — Z01810 Encounter for preprocedural cardiovascular examination: Secondary | ICD-10-CM | POA: Diagnosis not present

## 2018-01-02 DIAGNOSIS — G4733 Obstructive sleep apnea (adult) (pediatric): Secondary | ICD-10-CM

## 2018-01-02 DIAGNOSIS — I2510 Atherosclerotic heart disease of native coronary artery without angina pectoris: Secondary | ICD-10-CM | POA: Diagnosis not present

## 2018-01-02 DIAGNOSIS — F17200 Nicotine dependence, unspecified, uncomplicated: Secondary | ICD-10-CM

## 2018-01-02 MED ORDER — DILTIAZEM HCL 60 MG PO TABS: 60.0000 mg | ORAL_TABLET | Freq: Two times a day (BID) | ORAL | 11 refills | Status: DC

## 2018-01-02 NOTE — Patient Instructions (Addendum)
Medication Instructions:   Please monitor the blood pressure If blood pressure runs, especially with standing, Call the office We would cut the diltiazem in 1/2 twice a day  Labwork:  No new labs needed  Testing/Procedures:  No further testing at this time   Follow-Up: It was a pleasure seeing you in the office today. Please call us if you have new issues that need to be addressed before your next appt.  [phone]  Your physician wants you to follow-up in: 12 months.  You will receive a reminder letter in the mail two months in advance. If you don't receive a letter, please call our office to schedule the follow-up appointment.  If you need a refill on your cardiac medications before your next appointment, please call your pharmacy.  For educational health videos Log in to : [email or web address] Or : [email or web address], password : [email or web address]

## 2018-01-05 NOTE — Telephone Encounter (Signed)
Acceptable risk for hip surgery Not on anticoagulation No further testing needed

## 2018-01-06 NOTE — Telephone Encounter (Signed)
Clearance routed to number provided via Epic.  

## 2018-01-13 ENCOUNTER — Encounter: Payer: Self-pay | Admitting: Internal Medicine

## 2018-01-13 ENCOUNTER — Other Ambulatory Visit (INDEPENDENT_AMBULATORY_CARE_PROVIDER_SITE_OTHER): Payer: Self-pay

## 2018-01-13 ENCOUNTER — Other Ambulatory Visit: Payer: Self-pay | Admitting: Internal Medicine

## 2018-01-13 DIAGNOSIS — F411 Generalized anxiety disorder: Secondary | ICD-10-CM

## 2018-01-13 MED ORDER — LORAZEPAM 2 MG PO TABS: ORAL_TABLET | ORAL | 0 refills | Status: DC

## 2018-01-13 MED ORDER — CITALOPRAM HYDROBROMIDE 40 MG PO TABS: 40.0000 mg | ORAL_TABLET | Freq: Every day | ORAL | 1 refills | Status: DC

## 2018-02-06 ENCOUNTER — Ambulatory Visit: Payer: Self-pay | Admitting: Adult Health

## 2018-02-11 ENCOUNTER — Telehealth (INDEPENDENT_AMBULATORY_CARE_PROVIDER_SITE_OTHER): Payer: Self-pay | Admitting: Orthopaedic Surgery

## 2018-02-11 NOTE — Telephone Encounter (Signed)
Patient's wife called this morning stating that he is not going to be able to wait until the 24th for surgery.  She stated that the bones are now popping and cracking.  CB#[phone].  Thank you.

## 2018-02-11 NOTE — Telephone Encounter (Signed)
Is this patient able to be scheduled sooner?  Please advise.

## 2018-02-12 NOTE — Telephone Encounter (Signed)
I called patient's wife regarding a sooner date for surgery, but according to the last office visit he had his injection and needs to wait 3 months before his hip arthroplasty, which would be mid May after the 15th.  Surgery has been scheduled for May 24th. Patient is scheduled for H&P with Benjiman Core, PA on 02-27-18 @ 10:30. The wife is worried because she states patient is screaming out in excruciating pain and the "left hip bone is sticking out and is popping and cracking".   She is very concerned that he will not able to make it here from [facility] for the pre-op , then make it back here for the pre-admissions for lab work, only to turn around and come back to [place] for the surgery. She states over and over "he'll never make it".  Pre-operative clearance has already been obtained from Dr Ida Rogue. Per the cardiology notes, patient is at acceptable risk for hip surgery and is not on anticoagulation AND no further testing is needed.  Wife is asking if there is anyway to get him in sooner.  Please advise.

## 2018-02-12 NOTE — Telephone Encounter (Signed)
Yes, I will call the patient this afternoon and offer something sooner.

## 2018-02-13 NOTE — Telephone Encounter (Signed)
I have left a message with pre-admission asking them to call me so that we can coordinate the appointment for labs on the same day as the history and physical with  Jeneen Rinks.

## 2018-02-13 NOTE — Telephone Encounter (Signed)
Please see below. Dr. [name] recommends patient have lab work at hospital the same day as history and physical with Jeneen Rinks if possible. Otherwise, proceed with surgery as scheduled.

## 2018-02-13 NOTE — Telephone Encounter (Signed)
OK for Rx. I discussed with wife again about not taking much pain medication before surgery so pain meds will work after surgery better. I have discussed this with both of them many times starting 4 months ago.

## 2018-02-13 NOTE — Telephone Encounter (Signed)
U can see if they can have ROV with Benjiman Core and then get labs the same day for pre-op visit thanks. That would save her one trip. thanks

## 2018-02-13 NOTE — Telephone Encounter (Signed)
Please see below and advise. Thanks

## 2018-02-13 NOTE — Telephone Encounter (Signed)
Patient's wife returned call and states patient needs a RX for Hydrocodone and that she will pick it up tomorrow at 11am. I advised I would send message to Dr. [name] because he would have to authorize med request. Advised I would call back if there is a problem.   Please advise. OK for RX?

## 2018-02-13 NOTE — Telephone Encounter (Signed)
I called patient's wife on line below.  Patient answered and stated to call cell phone. I left voicemail on cell phone and asked for return call in [place] office today or in [place] office tomorrow if she still needs to speak with me.

## 2018-02-13 NOTE — Telephone Encounter (Signed)
noted 

## 2018-02-13 NOTE — Telephone Encounter (Signed)
I spoke with Baker Janus at [facility] and she has coordinated the lab appointment for the same day day as the history and physical with Jeneen Rinks.  Patient will see Jeneen Rinks @ 10:30am on the 16th of May and can go to Pre-admissions @ 1:00pm. Mrs. Armbrust is aware of the appointment change, but is requesting a call from [place].

## 2018-02-14 NOTE — Telephone Encounter (Signed)
Noted. Script at front for patient's wife to pick up.

## 2018-02-24 ENCOUNTER — Other Ambulatory Visit (HOSPITAL_COMMUNITY): Payer: Self-pay

## 2018-02-25 DIAGNOSIS — W06XXXA Fall from bed, initial encounter: Secondary | ICD-10-CM | POA: Diagnosis not present

## 2018-02-25 DIAGNOSIS — N400 Enlarged prostate without lower urinary tract symptoms: Secondary | ICD-10-CM | POA: Diagnosis not present

## 2018-02-25 DIAGNOSIS — R911 Solitary pulmonary nodule: Secondary | ICD-10-CM | POA: Diagnosis not present

## 2018-02-25 DIAGNOSIS — R9431 Abnormal electrocardiogram [ECG] [EKG]: Secondary | ICD-10-CM | POA: Diagnosis not present

## 2018-02-25 DIAGNOSIS — M545 Low back pain: Secondary | ICD-10-CM | POA: Diagnosis not present

## 2018-02-25 DIAGNOSIS — M25551 Pain in right hip: Secondary | ICD-10-CM | POA: Diagnosis not present

## 2018-02-25 DIAGNOSIS — R1032 Left lower quadrant pain: Secondary | ICD-10-CM | POA: Diagnosis not present

## 2018-02-25 DIAGNOSIS — R109 Unspecified abdominal pain: Secondary | ICD-10-CM | POA: Diagnosis not present

## 2018-02-25 DIAGNOSIS — R0781 Pleurodynia: Secondary | ICD-10-CM | POA: Diagnosis not present

## 2018-02-25 DIAGNOSIS — W19XXXA Unspecified fall, initial encounter: Secondary | ICD-10-CM | POA: Diagnosis not present

## 2018-02-25 DIAGNOSIS — S3992XA Unspecified injury of lower back, initial encounter: Secondary | ICD-10-CM | POA: Diagnosis not present

## 2018-02-25 DIAGNOSIS — M50322 Other cervical disc degeneration at C5-C6 level: Secondary | ICD-10-CM | POA: Diagnosis not present

## 2018-02-25 DIAGNOSIS — M1611 Unilateral primary osteoarthritis, right hip: Secondary | ICD-10-CM | POA: Diagnosis not present

## 2018-02-25 DIAGNOSIS — S0990XA Unspecified injury of head, initial encounter: Secondary | ICD-10-CM | POA: Diagnosis not present

## 2018-02-25 DIAGNOSIS — R918 Other nonspecific abnormal finding of lung field: Secondary | ICD-10-CM | POA: Diagnosis not present

## 2018-02-25 DIAGNOSIS — S3991XA Unspecified injury of abdomen, initial encounter: Secondary | ICD-10-CM | POA: Diagnosis not present

## 2018-02-25 DIAGNOSIS — S299XXA Unspecified injury of thorax, initial encounter: Secondary | ICD-10-CM | POA: Diagnosis not present

## 2018-02-25 DIAGNOSIS — S199XXA Unspecified injury of neck, initial encounter: Secondary | ICD-10-CM | POA: Diagnosis not present

## 2018-02-25 DIAGNOSIS — S3993XA Unspecified injury of pelvis, initial encounter: Secondary | ICD-10-CM | POA: Diagnosis not present

## 2018-02-25 DIAGNOSIS — M4802 Spinal stenosis, cervical region: Secondary | ICD-10-CM | POA: Diagnosis not present

## 2018-02-25 DIAGNOSIS — M549 Dorsalgia, unspecified: Secondary | ICD-10-CM | POA: Diagnosis not present

## 2018-02-26 ENCOUNTER — Ambulatory Visit: Payer: Self-pay | Admitting: Adult Health

## 2018-02-26 DIAGNOSIS — N400 Enlarged prostate without lower urinary tract symptoms: Secondary | ICD-10-CM | POA: Insufficient documentation

## 2018-02-26 DIAGNOSIS — N401 Enlarged prostate with lower urinary tract symptoms: Secondary | ICD-10-CM | POA: Insufficient documentation

## 2018-02-26 MED ORDER — SODIUM CHLORIDE 0.9 % IJ SOLN
5.00 | INTRAMUSCULAR | Status: DC
Start: 2018-02-25 — End: 2018-02-26

## 2018-02-26 NOTE — Pre-Procedure Instructions (Signed)
[name] Yu  02/26/2018      [place], [place] [address] [place] [address] Phone: [phone] Fax: [phone]    Your procedure is scheduled on Fri., Mar 07, 2018  Report to [facility] Admitting Entrance "A" at 8:00AM  Call this number if you have problems the morning of surgery:  [phone]   Remember:  Do not eat food or drink liquids after midnight on May 23rd  Take these medicines the morning of surgery with A SIP OF WATER: Citalopram (CELEXA), Diltiazem (CARDIZEM), and Finasteride (PROSCAR). If needed HYDROcodone-acetaminophen [facility])  Follow your doctors instructions regarding your Aspirin.  If no instructions were given by your doctor, then you will need to call the prescribing office office to get instructions.    7 days before surgery (5/17), stop taking all Aspirins, Vitamins, Fish oils, and Herbal medications. Also stop all NSAIDS i.e. Advil, Ibuprofen, Motrin, Aleve, Anaprox, Naproxen, BC and Goody Powders.   Do not wear jewelry.  Do not wear lotions, powders, colognes, or deodorant.  Do not shave 48 hours prior to surgery.  Men may shave face.  Do not bring valuables to the hospital.  [facility] is not responsible for any belongings or valuables.  Contacts, dentures or bridgework may not be worn into surgery.  Leave your suitcase in the car.  After surgery it may be brought to your room.  For patients admitted to the hospital, discharge time will be determined by your treatment team.  Patients discharged the day of surgery will not be allowed to drive home.   Special instructions:  [place]- Preparing For Surgery  Before surgery, you can play an important role. Because skin is not sterile, your skin needs to be as free of germs as possible. You can reduce the number of germs on your skin by washing with CHG (chlorahexidine gluconate) Soap before  surgery.  CHG is an antiseptic cleaner which kills germs and bonds with the skin to continue killing germs even after washing.  Oral Hygiene is also important to reduce your risk of infection.  Remember - BRUSH YOUR TEETH THE MORNING OF SURGERY  Please do not use if you have an allergy to CHG or antibacterial soaps. If your skin becomes reddened/irritated stop using the CHG.  Do not shave (including legs and underarms) for at least 48 hours prior to first CHG shower. It is OK to shave your face.  Please follow these instructions carefully.   1. Shower the NIGHT BEFORE SURGERY and the MORNING OF SURGERY with CHG.   2. If you chose to wash your hair, wash your hair first as usual with your normal shampoo.  3. After you shampoo, rinse your hair and body thoroughly to remove the shampoo.  4. Use CHG as you would any other liquid soap. You can apply CHG directly to the skin and wash gently with a scrungie or a clean washcloth.   5. Apply the CHG Soap to your body ONLY FROM THE NECK DOWN.  Do not use on open wounds or open sores. Avoid contact with your eyes, ears, mouth and genitals (private parts). Wash Face and genitals (private parts)  with your normal soap.  6. Wash thoroughly, paying special attention to the area where your surgery will be performed.  7. Thoroughly rinse your body with warm water from the neck down.  8. DO  NOT shower/wash with your normal soap after using and rinsing off the CHG Soap.  9. Pat yourself dry with a CLEAN TOWEL.  10. Wear CLEAN PAJAMAS to bed the night before surgery, wear comfortable clothes the morning of surgery  11. Place CLEAN SHEETS on your bed the night of your first shower and DO NOT SLEEP WITH PETS.  Day of Surgery:  Do not apply any deodorants/lotions.  Please wear clean clothes to the hospital/surgery center.   Remember to brush your teeth.    Please read over the following fact sheets that you were given. Pain Booklet, Coughing and Deep  Breathing, MRSA Information and Surgical Site Infection Prevention

## 2018-02-26 NOTE — Progress Notes (Signed)
MEDICARE ANNUAL WELLNESS VISIT AND FOLLOW UP Assessment:   Diagnoses and all orders for this visit:  Encounter for Medicare annual wellness exam  Essential hypertension Continue medications Monitor blood pressure at home; call if consistently over 130/80 Continue DASH diet.   Reminder to go to the ER if any CP, SOB, nausea, dizziness, severe HA, changes vision/speech, left arm numbness and tingling and jaw pain.  Coronary artery disease due to lipid rich plaque Control blood pressure, cholesterol, glucose, increase exercise.  Followed by Dr. Rockey Situ  Paroxysmal atrial fibrillation ([place]) Rate controlled today; continue ASA Followed by Dr. Rockey Situ  ASHD (arteriosclerotic heart disease) Control blood pressure, cholesterol, glucose, increase exercise.  Followed by Dr. Rockey Situ  OSA (obstructive sleep apnea) Continue with CPAP  Diverticulosis Add soluble fiber   Vitamin D deficiency At goal at recent check; continue to recommend supplementation for goal of 70-100 Defer vitamin D level  Other abnormal glucose Recent A1Cs at goal Discussed diet/exercise, weight management  Defer A1C; check BMP  Personal history of colonic polyps No more colonoscopies due to age  Anxiety Well managed by current regimen; continue medications- reminded risks of benzos and to limit use Stress management techniques discussed, increase water, good sleep hygiene discussed, increase exercise, and increase veggies.   Mixed hyperlipidemia Continue low cholesterol diet and exercise.  Check lipid panel.   Medication management CBC, CMP/GFR  Depression, major, in remission ([place]) Continue medications  Lifestyle discussed: diet/exerise, sleep hygiene, stress management, hydration  BMI 26.0-26.9,adult Continue to monitor  Benign prostatic hyperplasia, unspecified whether lower urinary tract symptoms present Continue with medications  Over 30 minutes of exam, counseling, chart review, and critical  decision making was performed  Future Appointments  Date Time Provider [place]  03/19/2018 10:45 AM [name], MD [facility] None  05/15/2018 11:30 AM [name], MD [facility] None  11/21/2018 10:00 AM [name], MD [facility] None     Plan:   During the course of the visit the patient was educated and counseled about appropriate screening and preventive services including:    Pneumococcal vaccine   Influenza vaccine  Prevnar 13  Td vaccine  Screening electrocardiogram  Colorectal cancer screening  Diabetes screening  Glaucoma screening  Nutrition counseling    Subjective:  [name] Yu is a 84 y.o. male who presents for Medicare Annual Wellness Visit and 3 month follow up for HTN, hyperlipidemia, glucose managment, and vitamin D Def.  Patient has hx/o pAfib since d/c'd consequent of a large rectus sheath hematoma & is only on LD bASA. Heart cath in 2003 was Negative. Cardiolite in 2007 was also Negative. He presents today immediately after preop visit for upcoming R hip total arthoplasty scheduled for 03/07/2018.   he has a diagnosis of depression/anxiety and is currently on lorazepan 1-2 mg PRN anxiety, reports symptoms are well controlled on current regimen. he currently takes 0.5 mg 5/7 nights of the week. We did discussed risks related to concurrent use of benzos and opioids today at length, and discussed after surgery expect pain medication will be tapered off to limit these risks.   BMI is Body mass index is 27.84 kg/m., he has not been working on diet and exercise. Wt Readings from Last 3 Encounters:  02/27/18 194 lb (88 kg)  02/27/18 185 lb (83.9 kg)  02/27/18 185 lb (83.9 kg)   His blood pressure has been controlled at home, today their BP is BP: 112/64 He does not workout. He denies chest pain, shortness of breath,  dizziness.   He is not on cholesterol medication and denies myalgias. His cholesterol is not at goal. The cholesterol last  visit was:   Lab Results  Component Value Date   CHOL 191 10/29/2017   HDL 47 10/29/2017   LDLCALC 113 (H) 10/29/2017   TRIG 184 (H) 10/29/2017   CHOLHDL 4.1 10/29/2017   He has been working on diet and exercise for glucose management, and denies foot ulcerations, increased appetite, nausea, paresthesia of the feet, polydipsia, polyuria, visual disturbances, vomiting and weight loss. Last A1C in the office was:  Lab Results  Component Value Date   HGBA1C 5.5 10/29/2017   Last GFR Lab Results  Component Value Date   GFRNONAA >60 02/27/2018    Patient is on Vitamin D supplement and approaching goal at recent check:    Lab Results  Component Value Date   VD25OH 67 10/29/2017      Medication Review:   Current Outpatient Medications (Cardiovascular):  .  diltiazem (CARDIZEM) 60 MG tablet, Take 1 tablet (60 mg total) by mouth 2 (two) times daily.   Current Outpatient Medications (Analgesics):  .  aspirin EC 81 MG tablet, Take 1 tablet (81 mg total) by mouth daily. Marland Kitchen  HYDROcodone-acetaminophen (NORCO) 5-325 MG tablet, Take 1/2 tablet twice daily as needed for pain. (Patient taking differently: Take 0.5 tablets by mouth 2 (two) times daily as needed. Take 1/2 tablet twice daily as needed for pain.)   Current Outpatient Medications (Other):  Marland Kitchen  Calcium Carbonate Antacid (TUMS E-X PO), Take 2 tablets by mouth daily. .  Cholecalciferol 5000 UNITS TABS, Take 1 tablet by mouth daily. .  citalopram (CELEXA) 40 MG tablet, Take 1 tablet (40 mg total) by mouth daily. (Patient taking differently: Take 20 mg by mouth daily. ) .  finasteride (PROSCAR) 5 MG tablet, TAKE 1 TABLET BY MOUTH EVERY DAY .  LORazepam (ATIVAN) 2 MG tablet, Take 1/2 to 1 tablet ONLY if needed at hour of sleep .  Magnesium 200 MG TABS, Take 1 tablet by mouth daily. .  Multiple Vitamin (MULTIVITAMIN) capsule, Take 1 capsule by mouth daily. .  Omega-3 Fatty Acids (FISH OIL PO), Take 1 capsule by mouth daily.  .  vitamin  C (ASCORBIC ACID) 500 MG tablet, Take 1,000 mg by mouth daily.  Allergies: Allergies  Allergen Reactions  . Gabapentin     Confusion   . Ciprofloxacin     dysphoria  . Novocain [Procaine Hcl]   . Prednisone     Nervousness    Current Problems (verified) has Hypertension; Other abnormal glucose; Mixed hyperlipidemia; OSA (obstructive sleep apnea); Atrial fibrillation ([place]); Personal history of colonic polyps; ASHD (arteriosclerotic heart disease); Depression, major, in remission ([place]); Diverticulosis; Medication management; Vitamin D deficiency; Encounter for Medicare annual wellness exam; BMI 26.0-26.9,adult; CAD (coronary artery disease); Anxiety; and BPH (benign prostatic hyperplasia) on their problem list.  Screening Tests Immunization History  Administered Date(s) Administered  . DT 07/06/2014  . Influenza Split 07/20/2014, 07/18/2016  . Influenza, High Dose Seasonal PF 08/22/2015, 07/15/2017  . Pneumococcal Conjugate-13 07/06/2014  . Pneumococcal Polysaccharide-23 06/09/2013  . Td 12/15/2016  . Zoster 10/15/2005    Preventative care: Last colonoscopy: 2003, done due to age  Prior vaccinations: TD or Tdap: 2018  Influenza: 2018  Pneumococcal: 2014 Prevnar13: 2015 Shingles/Zostavax: 2007  Names of Other Physician/Practitioners you currently use: 1. [facility] 3. Dr Olena Heckle, dentist,  last visit 2017   Patient Care Team: [name], MD as PCP - General (Internal Medicine) Benito Mccreedy, MD as Referring Physician (Family Medicine) Minna Merritts, MD as Consulting Physician (Cardiology) Inda Castle, MD (Inactive) as Consulting Physician (Gastroenterology)  Surgical: He  has a past surgical history that includes Hernia repair and PROSTATE. Family His family history includes Alzheimer's disease in his mother; Breast cancer in his daughter and  sister; Diabetes in his brother; Heart disease in his brother and sister; Kidney cancer in his father; Rectal cancer in his sister; Ulcerative colitis in his sister. Social history  He reports that he quit smoking about 24 years ago. He has never used smokeless tobacco. He reports that he does not drink alcohol or use drugs.  MEDICARE WELLNESS OBJECTIVES: Physical activity: Current Exercise Habits: The patient does not participate in regular exercise at present, Exercise limited by: orthopedic condition(s) Cardiac risk factors: Cardiac Risk Factors include: advanced age (>15men, >69 women);dyslipidemia;hypertension;male gender;smoking/ tobacco exposure Depression/mood screen:   Depression screen [facility] 2/9 02/27/2018  Decreased Interest 0  Down, Depressed, Hopeless 0  PHQ - 2 Score 0    ADLs:  In your present state of health, do you have any difficulty performing the following activities: 02/27/2018 02/27/2018  Hearing? N N  Vision? N N  Difficulty concentrating or making decisions? N N  Comment - -  Walking or climbing stairs? Y Y  Comment Severe R hip arthritis; scheduled for surgery next week  -  Dressing or bathing? Y Y  Comment R/t hip -  Doing errands, shopping? Y -  Comment Driven by wife -  Conservation officer, nature and eating ? N -  Using the Toilet? N -  In the past six months, have you accidently leaked urine? N -  Do you have problems with loss of bowel control? Y -  Comment R/t limited mobility -  Managing your Medications? N -  Managing your Finances? N -  Housekeeping or managing your Housekeeping? N -  Comment Wife helps -  Some recent data might be hidden     Cognitive Testing  Alert? Yes  Normal Appearance?Yes  Oriented to person? Yes  Place? Yes   Time? Yes  Recall of three objects?  Yes  Can perform simple calculations? Yes  Displays appropriate judgment?Yes  Can read the correct time from a watch face?Yes  EOL planning: Does Patient Have a Medical Advance Directive?:  Yes Type of Advance Directive: Healthcare Power of Attorney, Living will Does patient want to make changes to medical advance directive?: No - Patient declined Copy of [place] in Chart?: No - copy requested   Objective:   Today's Vitals   02/27/18 1449  BP: 112/64  Pulse: 66  Resp: 16  Temp: (!) 97.5 F (36.4 C)  SpO2: 94%  Weight: 194 lb (88 kg)  Height: 5\' 10"  (1.778 m)  PainSc: 5   PainLoc: Hip   Body mass index is 27.84 kg/m.  General appearance: alert, no distress, WD/WN, male HEENT: normocephalic, sclerae anicteric, TMs pearly, nares patent, no discharge or erythema, pharynx normal Oral cavity: MMM, no lesions Neck: supple, no lymphadenopathy, no thyromegaly, no masses Heart: RRR, normal S1, S2, no murmurs Lungs: CTA bilaterally, no wheezes, rhonchi, or rales Abdomen: +bs, soft, non tender, non distended, no masses, no hepatomegaly, no splenomegaly Musculoskeletal: nontender, no swelling, no obvious deformity Extremities: no edema, no cyanosis, no clubbing Pulses: 1+ symmetric, upper and lower extremities, normal cap refill Neurological:  alert, oriented x 3, CN2-12 intact, strength normal upper extremities and lower extremities, sensation normal throughout, gait slow, antalgic Psychiatric: normal affect, behavior normal, pleasant   Medicare Attestation I have personally reviewed: The patient's medical and social history Their use of alcohol, tobacco or illicit drugs Their current medications and supplements The patient's functional ability including ADLs,fall risks, home safety risks, cognitive, and hearing and visual impairment Diet and physical activities Evidence for depression or mood disorders  The patient's weight, height, BMI, and visual acuity have been recorded in the chart.  I have made referrals, counseling, and provided education to the patient based on review of the above and I have provided the patient with a written personalized  care plan for preventive services.     Izora Ribas, NP   02/27/2018

## 2018-02-27 ENCOUNTER — Encounter (HOSPITAL_COMMUNITY)
Admission: RE | Admit: 2018-02-27 | Discharge: 2018-02-27 | Disposition: A | Discharge status: Home / Self Care | Payer: Medicare HMO | Source: Ambulatory Visit | Attending: Orthopaedic Surgery | Admitting: Orthopaedic Surgery

## 2018-02-27 ENCOUNTER — Ambulatory Visit (INDEPENDENT_AMBULATORY_CARE_PROVIDER_SITE_OTHER): Payer: Medicare HMO | Admitting: Adult Health

## 2018-02-27 ENCOUNTER — Encounter: Payer: Self-pay | Admitting: Adult Health

## 2018-02-27 ENCOUNTER — Ambulatory Visit (INDEPENDENT_AMBULATORY_CARE_PROVIDER_SITE_OTHER): Payer: Medicare HMO | Admitting: Surgery

## 2018-02-27 ENCOUNTER — Telehealth: Payer: Self-pay | Admitting: *Deleted

## 2018-02-27 ENCOUNTER — Encounter (HOSPITAL_COMMUNITY): Payer: Self-pay

## 2018-02-27 ENCOUNTER — Encounter (INDEPENDENT_AMBULATORY_CARE_PROVIDER_SITE_OTHER): Payer: Self-pay | Admitting: Surgery

## 2018-02-27 ENCOUNTER — Other Ambulatory Visit: Payer: Self-pay

## 2018-02-27 VITALS — BP 112/64 | HR 66 | Temp 97.5°F | Resp 16 | Ht 70.0 in | Wt 194.0 lb

## 2018-02-27 VITALS — BP 111/63 | HR 68 | Temp 97.6°F | Ht 70.0 in | Wt 185.0 lb

## 2018-02-27 DIAGNOSIS — I480 Paroxysmal atrial fibrillation: Secondary | ICD-10-CM | POA: Diagnosis not present

## 2018-02-27 DIAGNOSIS — G4733 Obstructive sleep apnea (adult) (pediatric): Secondary | ICD-10-CM | POA: Diagnosis not present

## 2018-02-27 DIAGNOSIS — F325 Major depressive disorder, single episode, in full remission: Secondary | ICD-10-CM | POA: Diagnosis not present

## 2018-02-27 DIAGNOSIS — R69 Illness, unspecified: Secondary | ICD-10-CM | POA: Diagnosis not present

## 2018-02-27 DIAGNOSIS — N400 Enlarged prostate without lower urinary tract symptoms: Secondary | ICD-10-CM

## 2018-02-27 DIAGNOSIS — M1611 Unilateral primary osteoarthritis, right hip: Secondary | ICD-10-CM

## 2018-02-27 DIAGNOSIS — I10 Essential (primary) hypertension: Secondary | ICD-10-CM | POA: Diagnosis not present

## 2018-02-27 DIAGNOSIS — Z79899 Other long term (current) drug therapy: Secondary | ICD-10-CM

## 2018-02-27 DIAGNOSIS — I2583 Coronary atherosclerosis due to lipid rich plaque: Secondary | ICD-10-CM

## 2018-02-27 DIAGNOSIS — E559 Vitamin D deficiency, unspecified: Secondary | ICD-10-CM

## 2018-02-27 DIAGNOSIS — R7309 Other abnormal glucose: Secondary | ICD-10-CM

## 2018-02-27 DIAGNOSIS — I2510 Atherosclerotic heart disease of native coronary artery without angina pectoris: Secondary | ICD-10-CM

## 2018-02-27 DIAGNOSIS — Z0000 Encounter for general adult medical examination without abnormal findings: Secondary | ICD-10-CM

## 2018-02-27 DIAGNOSIS — E782 Mixed hyperlipidemia: Secondary | ICD-10-CM

## 2018-02-27 DIAGNOSIS — Z86010 Personal history of colonic polyps: Secondary | ICD-10-CM

## 2018-02-27 DIAGNOSIS — Z6826 Body mass index (BMI) 26.0-26.9, adult: Secondary | ICD-10-CM

## 2018-02-27 DIAGNOSIS — F419 Anxiety disorder, unspecified: Secondary | ICD-10-CM | POA: Diagnosis not present

## 2018-02-27 DIAGNOSIS — Z01812 Encounter for preprocedural laboratory examination: Secondary | ICD-10-CM | POA: Insufficient documentation

## 2018-02-27 DIAGNOSIS — M25551 Pain in right hip: Secondary | ICD-10-CM | POA: Diagnosis not present

## 2018-02-27 DIAGNOSIS — R39198 Other difficulties with micturition: Secondary | ICD-10-CM | POA: Diagnosis not present

## 2018-02-27 DIAGNOSIS — K5790 Diverticulosis of intestine, part unspecified, without perforation or abscess without bleeding: Secondary | ICD-10-CM | POA: Diagnosis not present

## 2018-02-27 HISTORY — DX: Malignant (primary) neoplasm, unspecified: C80.1

## 2018-02-27 HISTORY — DX: Personal history of urinary calculi: Z87.442

## 2018-02-27 HISTORY — DX: Unspecified osteoarthritis, unspecified site: M19.90

## 2018-02-27 HISTORY — DX: Cardiac arrhythmia, unspecified: I49.9

## 2018-02-27 LAB — COMPREHENSIVE METABOLIC PANEL
ALT: 11 U/L — AB (ref 17–63)
AST: 19 U/L (ref 15–41)
Albumin: 3.7 g/dL (ref 3.5–5.0)
Alkaline Phosphatase: 78 U/L (ref 38–126)
Anion gap: 7 (ref 5–15)
BUN: 10 mg/dL (ref 6–20)
CO2: 28 mmol/L (ref 22–32)
Calcium: 9.5 mg/dL (ref 8.9–10.3)
Chloride: 104 mmol/L (ref 101–111)
Creatinine, Ser: 1.03 mg/dL (ref 0.61–1.24)
GFR calc Af Amer: >60 mL/min (ref >60)
Glucose, Bld: 118 mg/dL — ABNORMAL HIGH (ref 65–99)
POTASSIUM: 4.2 mmol/L (ref 3.5–5.1)
SODIUM: 139 mmol/L (ref 135–145)
Total Bilirubin: 0.4 mg/dL (ref 0.3–1.2)
Total Protein: 6.2 g/dL — ABNORMAL LOW (ref 6.5–8.1)

## 2018-02-27 LAB — CBC
HCT: 47.5 % (ref 39.0–52.0)
Hemoglobin: 15 g/dL (ref 13.0–17.0)
MCH: 30.7 pg (ref 26.0–34.0)
MCHC: 31.6 g/dL (ref 30.0–36.0)
MCV: 97.3 fL (ref 78.0–100.0)
Platelets: 291 10*3/uL (ref 150–400)
RBC: 4.88 MIL/uL (ref 4.22–5.81)
RDW: 13.7 % (ref 11.5–15.5)
WBC: 11 10*3/uL — AB (ref 4.0–10.5)

## 2018-02-27 LAB — URINALYSIS, ROUTINE W REFLEX MICROSCOPIC
BILIRUBIN URINE: NEGATIVE
GLUCOSE, UA: NEGATIVE mg/dL
Hgb urine dipstick: NEGATIVE
KETONES UR: NEGATIVE mg/dL
Leukocytes, UA: NEGATIVE
Nitrite: NEGATIVE
PH: 5 (ref 5.0–8.0)
PROTEIN: 30 mg/dL — AB
Specific Gravity, Urine: 1.023 (ref 1.005–1.030)

## 2018-02-27 LAB — SURGICAL PCR SCREEN
MRSA, PCR: NEGATIVE
Staphylococcus aureus: NEGATIVE

## 2018-02-27 MED ORDER — HYDROCODONE-ACETAMINOPHEN 5-325 MG PO TABS: 1.0000 | ORAL_TABLET | Freq: Four times a day (QID) | ORAL | 0 refills | Status: DC | PRN

## 2018-02-27 NOTE — Progress Notes (Signed)
84 year old white male history of end-stage degenerative arthritis right hip presents for preop history and physical.  We received preop cardiac clearance.  Symptoms unchanged.  Full H&P placed in patient's chart.

## 2018-02-27 NOTE — H&P (Signed)
TOTAL HIP ADMISSION H&P  Patient is admitted for right total hip arthroplasty.  Subjective:  Chief Complaint: right hip pain  HPI: [name] Yu, 84 y.o. male, has a history of pain and functional disability in the right hip(s) due to arthritis and patient has failed non-surgical conservative treatments for greater than 12 weeks to include NSAID's and/or analgesics, use of assistive devices and activity modification.  Onset of symptoms was gradual starting 10 years ago with gradually worsening course since that time. Patient currently rates pain in the right hip at 10 out of 10 with activity. Patient has night pain, worsening of pain with activity and weight bearing, trendelenberg gait, pain with passive range of motion and joint swelling. Patient has evidence of subchondral sclerosis, periarticular osteophytes and joint space narrowing by imaging studies. This condition presents safety issues increasing the risk of falls.   There is no current active infection.  Patient Active Problem List   Diagnosis Date Noted  . BPH (benign prostatic hyperplasia) 02/26/2018  . Anxiety 10/16/2017  . CAD (coronary artery disease) 12/28/2015  . BMI 26.0-26.9,adult 08/21/2015  . Encounter for Medicare annual wellness exam 04/27/2015  . Vitamin D deficiency 03/04/2014  . Medication management 02/17/2014  . ASHD (arteriosclerotic heart disease)   . Depression, major, in remission ([place])   . Diverticulosis   . Personal history of colonic polyps 12/29/2012  . Hypertension   . Other abnormal glucose   . Mixed hyperlipidemia   . OSA (obstructive sleep apnea)   . Atrial fibrillation [facility])    Past Medical History:  Diagnosis Date  . ASHD (arteriosclerotic heart disease)   . Atrial fib/flutter, transient    off coumadin since 11/2013- CHADSVAC of 2  . Colon polyps    2003 hyperplastic and tubuler adenoma  . Depression   . Diverticulosis   . Dyslipidemia   . Hypertension   . OSA (obstructive sleep  apnea)   . Pre-diabetes   . Rectus sheath hematoma 11/21/2013   Dr. Deatra Ina     Past Surgical History:  Procedure Laterality Date  . HERNIA REPAIR      No current facility-administered medications for this encounter.    Current Outpatient Medications  Medication Sig Dispense Refill Last Dose  . aspirin EC 81 MG tablet Take 1 tablet (81 mg total) by mouth daily. 90 tablet 3 Taking  . Calcium Carbonate Antacid (TUMS E-X PO) Take 2 tablets by mouth daily.   Taking  . Cholecalciferol 5000 UNITS TABS Take 1 tablet by mouth daily.   Taking  . citalopram (CELEXA) 40 MG tablet Take 1 tablet (40 mg total) by mouth daily. (Patient taking differently: Take 20 mg by mouth daily. ) 90 tablet 1   . diltiazem (CARDIZEM) 60 MG tablet Take 1 tablet (60 mg total) by mouth 2 (two) times daily. 60 tablet 11   . finasteride (PROSCAR) 5 MG tablet TAKE 1 TABLET BY MOUTH EVERY DAY 90 tablet 3 Taking  . HYDROcodone-acetaminophen (NORCO) 5-325 MG tablet Take 1/2 tablet twice daily as needed for pain. (Patient taking differently: Take 0.5 tablets by mouth 2 (two) times daily as needed. Take 1/2 tablet twice daily as needed for pain.) 30 tablet 0 Taking  . LORazepam (ATIVAN) 2 MG tablet Take 1/2 to 1 tablet ONLY if needed at hour of sleep 90 tablet 0   . Magnesium 200 MG TABS Take 1 tablet by mouth daily.   Taking  . Multiple Vitamin (MULTIVITAMIN) capsule Take 1 capsule by mouth daily.  Taking  . Omega-3 Fatty Acids (FISH OIL PO) Take 1 capsule by mouth daily.    Taking  . vitamin C (ASCORBIC ACID) 500 MG tablet Take 1,000 mg by mouth daily.   Taking  . HYDROcodone-acetaminophen (NORCO/VICODIN) 5-325 MG tablet Take 1 tablet by mouth every 6 (six) hours as needed for moderate pain. 40 tablet 0    Allergies  Allergen Reactions  . Gabapentin     Confusion   . Ciprofloxacin     dysphoria  . Novocain [Procaine Hcl]   . Prednisone     Nervousness    Social History   Tobacco Use  . Smoking status: Former Smoker     Last attempt to quit: 09/14/1993    Years since quitting: 24.4  . Smokeless tobacco: Never Used  Substance Use Topics  . Alcohol use: No    Family History  Problem Relation Age of Onset  . Alzheimer's disease Mother   . Kidney cancer Father   . Diabetes Brother   . Breast cancer Sister   . Rectal cancer Sister   . Breast cancer Daughter   . Ulcerative colitis Sister   . Heart disease Brother   . Heart disease Sister      Review of Systems  Constitutional: Negative.   HENT: Negative.   Respiratory: Negative.   Cardiovascular: Negative.   Gastrointestinal: Negative.   Genitourinary: Negative.   Musculoskeletal: Positive for joint pain.  Neurological: Negative.   Psychiatric/Behavioral: Negative.     Objective:  Physical Exam  Constitutional: He is oriented to person, place, and time. He appears well-nourished. No distress.  HENT:  Head: Normocephalic and atraumatic.  Eyes: Pupils are equal, round, and reactive to light. EOM are normal.  Neck: Normal range of motion.  Cardiovascular: Normal rate.  Respiratory: Effort normal. No respiratory distress.  GI: Soft. He exhibits no distension. There is no tenderness.  Musculoskeletal: He exhibits tenderness.  Neurological: He is alert and oriented to person, place, and time.  Skin: Skin is warm and dry.  Psychiatric: He has a normal mood and affect.    Vital signs in last 24 hours: Temp:  [97.6 F (36.4 C)] 97.6 F (36.4 C) (05/16 1109) Pulse Rate:  [68] 68 (05/16 1109) BP: (111)/(63) 111/63 (05/16 1109) Weight:  [185 lb (83.9 kg)] 185 lb (83.9 kg) (05/16 1109)  Labs:   Estimated body mass index is 26.54 kg/m as calculated from the following:   Height as of 02/27/18: 5\' 10"  (1.778 m).   Weight as of 02/27/18: 185 lb (83.9 kg).   Imaging Review Plain radiographs demonstrate moderate degenerative joint disease of the right hip(s). The bone quality appears to be good for age and reported activity  level.    Preoperative templating of the joint replacement has been completed, documented, and submitted to the Operating Room personnel in order to optimize intra-operative equipment management.      Assessment/Plan:  End stage arthritis, right hip(s)  The patient history, physical examination, clinical judgement of the provider and imaging studies are consistent with end stage degenerative joint disease of the right hip(s) and total hip arthroplasty is deemed medically necessary. The treatment options including medical management, injection therapy, arthroscopy and arthroplasty were discussed at length. The risks and benefits of total hip arthroplasty were presented and reviewed. The risks due to aseptic loosening, infection, stiffness, dislocation/subluxation,  thromboembolic complications and other imponderables were discussed.  The patient acknowledged the explanation, agreed to proceed with the plan and consent was signed.  Patient is being admitted for inpatient treatment for surgery, pain control, PT, OT, prophylactic antibiotics, VTE prophylaxis, progressive ambulation and ADL's and discharge planning.The patient is planning to be discharged home with home health services

## 2018-02-27 NOTE — Patient Instructions (Signed)
Things to think/ask about:  where will the incision be? How big will it be? What kind of anesthesia will I be under? What level of pain can I expect? Etc. Write them all down    Total Hip Replacement Total hip replacement is a surgical procedure to remove damaged bone in your hip joint and replace it with an artificial hip joint (prosthetic hip joint). The purpose of this surgery is to reduce pain and to improve your hip function. During a total hip replacement, one or both parts of the hip joint are replaced, depending on the type of joint damage you have. The hip is a ball-and-socket type of joint, and it has two main parts. The ball part of the joint (femoral head) is the top of the thigh bone (femur). The socket part of the joint is a large indent in the side of your pelvis (acetabulum) where the femur and pelvis meet. Tell a health care provider about:  Any allergies you have.  All medicines you are taking, including vitamins, herbs, eye drops, creams, and over-the-counter medicines.  Any problems you or family members have had with anesthetic medicines.  Any blood disorders you have.  Any surgeries you have had.  Any medical conditions you have. What are the risks? Generally, total hip replacement is a safe procedure. However, problems can occur, including:  Infection.  Dislocation (the ball of the hip-joint prosthesis comes out of contact with the socket).  Loosening of the piece (stem) that connects the prosthetic femoral head to the femur.  Fracture of the bone while inserting the prosthesis.  Formation of blood clots, which can break loose and travel to and injure your lungs (pulmonary embolus).  What happens before the procedure?  Plan to have someone take you home after the procedure.  Do not eat or drink anything after midnight on the night before the procedure or as directed by your health care provider.  Ask your health care provider about: ? Changing or  stopping your regular medicines. This is especially important if you are taking diabetes medicines or blood thinners. ? Taking medicines such as aspirin and ibuprofen. These medicines can thin your blood. Do not take these medicines before your procedure if your health care provider asks you not to.  Ask your health care provider about how your surgical site will be marked or identified.  You may be given antibiotic medicines to help prevent infection. What happens during the procedure?  To reduce your risk of infection: ? Your health care team will wash or sanitize their hands. ? Your skin will be washed with soap.  An IV tube will be inserted into one of your veins. You will be given one or more of the following: ? A medicine that makes you drowsy (sedative). ? A medicine that makes you fall asleep (general anesthetic). ? A medicine injected into your spine that numbs your body below the waist (spinal anesthetic).  An incision will be made in your hip. Your surgeon will take out any damaged cartilage and bone.  Your surgeon will then: ? Insert a prosthetic socket into the acetabulum of your pelvis. This is usually secured with screws. ? Remove the femoral head and replace it with a prosthetic ball and stem secured into the top of your femur. ? Place the ball into the socket and check the range of motion and stability of your new hip. ? Close the incision and apply a bandage over the surgical site. What happens after  the procedure?  You will stay in a recovery area until the medicines have worn off.  Your vital signs, such as your pulse and blood pressure, will be monitored.  Once you are awake and stable, you will be taken to a hospital room.  You may be directed to take actions to help prevent blood clots. These may include: ? Walking soon after surgery, with someone assisting you. Moving around after surgery helps to improve blood flow. ? Taking medicines to thin your blood  (anticoagulants). ? Wearing compression stockings or using different types of devices.  You will receive physical therapy until you are doing well and your health care provider feels it is safe for you to go home. This information is not intended to replace advice given to you by your health care provider. Make sure you discuss any questions you have with your health care provider. Document Released: 01/07/2001 Document Revised: 06/04/2016 Document Reviewed: 12/02/2013 Elsevier Interactive Patient Education  [employer].

## 2018-02-27 NOTE — Telephone Encounter (Signed)
[name] Yu from short stay is calling. Patient would like to know if he can continue his aspirin preoperatively or if he should stop it prior to his hip surgery. Routing to Dr Rockey Situ for advice. We will need to call patient with directions.

## 2018-02-27 NOTE — Pre-Procedure Instructions (Signed)
[name] Yu  02/27/2018      [place], [name] [address] [place] [address] Phone: [phone] Fax: [phone]    Your procedure is scheduled on Fri., Mar 07, 2018  Report to [facility] Admitting Entrance "A" at 8:00AM  Call this number if you have problems the morning of surgery:  [phone]   Remember:  Do not eat food or drink liquids after midnight on May 23rd  Take these medicines the morning of surgery with A SIP OF WATER: Citalopram (CELEXA), Diltiazem (CARDIZEM), and Finasteride (PROSCAR). If needed HYDROcodone-acetaminophen [facility]) TUMS  Follow your doctors instructions regarding your Aspirin.  If no instructions were given by your doctor, then you will need to call the prescribing office office to get instructions.    7 days before surgery (5/17), stop taking all Aspirins, Vitamins, Fish oils, and Herbal medications. Also stop all NSAIDS i.e. Advil, Ibuprofen, Motrin, Aleve, Anaprox, Naproxen, BC and Goody Powders.   Do not wear jewelry.  Do not wear lotions, powders, colognes, or deodorant.  Do not shave 48 hours prior to surgery.  Men may shave face.  Do not bring valuables to the hospital.  [facility] is not responsible for any belongings or valuables.  Contacts, dentures or bridgework may not be worn into surgery.  Leave your suitcase in the car.  After surgery it may be brought to your room.  For patients admitted to the hospital, discharge time will be determined by your treatment team.  Patients discharged the day of surgery will not be allowed to drive home.   Special instructions:  [place]- Preparing For Surgery  Before surgery, you can play an important role. Because skin is not sterile, your skin needs to be as free of germs as possible. You can reduce the number of germs on your skin by washing with CHG (chlorahexidine gluconate) Soap  before surgery.  CHG is an antiseptic cleaner which kills germs and bonds with the skin to continue killing germs even after washing.  Oral Hygiene is also important to reduce your risk of infection.  Remember - BRUSH YOUR TEETH THE MORNING OF SURGERY  Please do not use if you have an allergy to CHG or antibacterial soaps. If your skin becomes reddened/irritated stop using the CHG.  Do not shave (including legs and underarms) for at least 48 hours prior to first CHG shower. It is OK to shave your face.  Please follow these instructions carefully.   1. Shower the NIGHT BEFORE SURGERY and the MORNING OF SURGERY with CHG.   2. If you chose to wash your hair, wash your hair first as usual with your normal shampoo.  3. After you shampoo, rinse your hair and body thoroughly to remove the shampoo.  4. Use CHG as you would any other liquid soap. You can apply CHG directly to the skin and wash gently with a scrungie or a clean washcloth.   5. Apply the CHG Soap to your body ONLY FROM THE NECK DOWN.  Do not use on open wounds or open sores. Avoid contact with your eyes, ears, mouth and genitals (private parts). Wash Face and genitals (private parts)  with your normal soap.  6. Wash thoroughly, paying special attention to the area where your surgery will be performed.  7. Thoroughly rinse your body with warm water from the neck down.  8.  DO NOT shower/wash with your normal soap after using and rinsing off the CHG Soap.  9. Pat yourself dry with a CLEAN TOWEL.  10. Wear CLEAN PAJAMAS to bed the night before surgery, wear comfortable clothes the morning of surgery  11. Place CLEAN SHEETS on your bed the night of your first shower and DO NOT SLEEP WITH PETS.  Day of Surgery:  Do not apply any deodorants/lotions.  Please wear clean clothes to the hospital/surgery center.   Remember to brush your teeth.    Please read over the following fact sheets that you were given. Pain Booklet, Coughing and  Deep Breathing, MRSA Information and Surgical Site Infection Prevention

## 2018-02-27 NOTE — Progress Notes (Signed)
SPOKE [place] OFFICE TO SEE IF PATIENT COULD STOP ASPIRIN PRIOR TO SURGERY OR DID HE NEED TO CONTINUE IT DUE TO HX AFIB. [place] DR. GOLLAN AND CALL PATIENT WITH INSTRUCTIONS.

## 2018-02-27 NOTE — Pre-Procedure Instructions (Signed)
[name] Yu  02/27/2018      [place], [place] [address] [place] [address] Phone: [phone] Fax: [phone]    Your procedure is scheduled on Fri., Mar 07, 2018  Report to [facility] Admitting Entrance "A" at 8:00AM  Call this number if you have problems the morning of surgery:  [phone]   Remember:  Do not eat food or drink liquids after midnight on May 23rd  Take these medicines the morning of surgery with A SIP OF WATER: Citalopram (CELEXA), Diltiazem (CARDIZEM), and Finasteride (PROSCAR). If needed HYDROcodone-acetaminophen [facility]) TUMS  Follow your doctors instructions regarding your Aspirin.  If no instructions were given by your doctor, then you will need to call the prescribing office office to get instructions.    7 days before surgery (5/17), stop taking all  Vitamins, Fish oils, and Herbal medications. Also stop all NSAIDS i.e. Advil, Ibuprofen, Motrin, Aleve, Anaprox, Naproxen, BC and Goody Powders. DR. Donivan Scull TO GIVE INSTRUCTION REGARDING ASPIRIN.   Do not wear jewelry.  Do not wear lotions, powders, colognes, or deodorant.  Do not shave 48 hours prior to surgery.  Men may shave face.  Do not bring valuables to the hospital.  [facility] is not responsible for any belongings or valuables.  Contacts, dentures or bridgework may not be worn into surgery.  Leave your suitcase in the car.  After surgery it may be brought to your room.  For patients admitted to the hospital, discharge time will be determined by your treatment team.  Patients discharged the day of surgery will not be allowed to drive home.   Special instructions:  [place]- Preparing For Surgery  Before surgery, you can play an important role. Because skin is not sterile, your skin needs to be as free of germs as possible. You can reduce the number of germs on your skin by washing  with CHG (chlorahexidine gluconate) Soap before surgery.  CHG is an antiseptic cleaner which kills germs and bonds with the skin to continue killing germs even after washing.  Oral Hygiene is also important to reduce your risk of infection.  Remember - BRUSH YOUR TEETH THE MORNING OF SURGERY  Please do not use if you have an allergy to CHG or antibacterial soaps. If your skin becomes reddened/irritated stop using the CHG.  Do not shave (including legs and underarms) for at least 48 hours prior to first CHG shower. It is OK to shave your face.  Please follow these instructions carefully.   1. Shower the NIGHT BEFORE SURGERY and the MORNING OF SURGERY with CHG.   2. If you chose to wash your hair, wash your hair first as usual with your normal shampoo.  3. After you shampoo, rinse your hair and body thoroughly to remove the shampoo.  4. Use CHG as you would any other liquid soap. You can apply CHG directly to the skin and wash gently with a scrungie or a clean washcloth.   5. Apply the CHG Soap to your body ONLY FROM THE NECK DOWN.  Do not use on open wounds or open sores. Avoid contact with your eyes, ears, mouth and genitals (private parts). Wash Face and genitals (private parts)  with your normal soap.  6. Wash thoroughly, paying special attention to the area where your surgery will be performed.  7. Thoroughly rinse your body with warm  water from the neck down.  8. DO NOT shower/wash with your normal soap after using and rinsing off the CHG Soap.  9. Pat yourself dry with a CLEAN TOWEL.  10. Wear CLEAN PAJAMAS to bed the night before surgery, wear comfortable clothes the morning of surgery  11. Place CLEAN SHEETS on your bed the night of your first shower and DO NOT SLEEP WITH PETS.  Day of Surgery:  Do not apply any deodorants/lotions.  Please wear clean clothes to the hospital/surgery center.   Remember to brush your teeth.    Please read over the following fact sheets that you  were given. Pain Booklet, Coughing and Deep Breathing, MRSA Information and Surgical Site Infection Prevention

## 2018-02-28 LAB — URINE CULTURE
MICRO NUMBER: 90599369
Result:: NO GROWTH
SPECIMEN QUALITY: ADEQUATE

## 2018-02-28 NOTE — Telephone Encounter (Signed)
Patient not available. Patient's wife answered and ok to discuss per DPR. She verbalized understanding that patient could stop aspirin 5 days prior to procedure if needed.

## 2018-02-28 NOTE — Telephone Encounter (Signed)
He can stop the aspirin for the procedure 5 days before if needed

## 2018-03-07 ENCOUNTER — Inpatient Hospital Stay (HOSPITAL_COMMUNITY): Payer: Medicare HMO

## 2018-03-07 ENCOUNTER — Inpatient Hospital Stay (HOSPITAL_COMMUNITY): Payer: Medicare HMO | Admitting: Certified Registered Nurse Anesthetist

## 2018-03-07 ENCOUNTER — Encounter (HOSPITAL_COMMUNITY): Payer: Self-pay

## 2018-03-07 ENCOUNTER — Inpatient Hospital Stay (HOSPITAL_COMMUNITY)
Admission: RE | Admit: 2018-03-07 | Discharge: 2018-03-11 | DRG: 470 | Disposition: A | Discharge status: Home / Self Care | Payer: Medicare HMO | Attending: Orthopaedic Surgery | Admitting: Orthopaedic Surgery

## 2018-03-07 ENCOUNTER — Encounter (HOSPITAL_COMMUNITY)
Admission: RE | Disposition: A | Discharge status: Home / Self Care | Payer: Self-pay | Source: Home / Self Care | Attending: Orthopaedic Surgery

## 2018-03-07 DIAGNOSIS — Z87891 Personal history of nicotine dependence: Secondary | ICD-10-CM | POA: Diagnosis not present

## 2018-03-07 DIAGNOSIS — F419 Anxiety disorder, unspecified: Secondary | ICD-10-CM | POA: Diagnosis present

## 2018-03-07 DIAGNOSIS — N9989 Other postprocedural complications and disorders of genitourinary system: Secondary | ICD-10-CM

## 2018-03-07 DIAGNOSIS — I10 Essential (primary) hypertension: Secondary | ICD-10-CM | POA: Diagnosis present

## 2018-03-07 DIAGNOSIS — F325 Major depressive disorder, single episode, in full remission: Secondary | ICD-10-CM | POA: Diagnosis present

## 2018-03-07 DIAGNOSIS — I4891 Unspecified atrial fibrillation: Secondary | ICD-10-CM | POA: Diagnosis not present

## 2018-03-07 DIAGNOSIS — Z86010 Personal history of colonic polyps: Secondary | ICD-10-CM | POA: Diagnosis not present

## 2018-03-07 DIAGNOSIS — Z888 Allergy status to other drugs, medicaments and biological substances status: Secondary | ICD-10-CM | POA: Diagnosis not present

## 2018-03-07 DIAGNOSIS — E559 Vitamin D deficiency, unspecified: Secondary | ICD-10-CM | POA: Diagnosis not present

## 2018-03-07 DIAGNOSIS — Z09 Encounter for follow-up examination after completed treatment for conditions other than malignant neoplasm: Secondary | ICD-10-CM

## 2018-03-07 DIAGNOSIS — Z7982 Long term (current) use of aspirin: Secondary | ICD-10-CM | POA: Diagnosis not present

## 2018-03-07 DIAGNOSIS — R69 Illness, unspecified: Secondary | ICD-10-CM | POA: Diagnosis not present

## 2018-03-07 DIAGNOSIS — R338 Other retention of urine: Secondary | ICD-10-CM | POA: Diagnosis not present

## 2018-03-07 DIAGNOSIS — G4733 Obstructive sleep apnea (adult) (pediatric): Secondary | ICD-10-CM | POA: Diagnosis present

## 2018-03-07 DIAGNOSIS — N400 Enlarged prostate without lower urinary tract symptoms: Secondary | ICD-10-CM | POA: Diagnosis not present

## 2018-03-07 DIAGNOSIS — Z85828 Personal history of other malignant neoplasm of skin: Secondary | ICD-10-CM | POA: Diagnosis not present

## 2018-03-07 DIAGNOSIS — E782 Mixed hyperlipidemia: Secondary | ICD-10-CM | POA: Diagnosis present

## 2018-03-07 DIAGNOSIS — G4730 Sleep apnea, unspecified: Secondary | ICD-10-CM | POA: Diagnosis present

## 2018-03-07 DIAGNOSIS — Z87442 Personal history of urinary calculi: Secondary | ICD-10-CM | POA: Diagnosis not present

## 2018-03-07 DIAGNOSIS — Z419 Encounter for procedure for purposes other than remedying health state, unspecified: Secondary | ICD-10-CM

## 2018-03-07 DIAGNOSIS — M1611 Unilateral primary osteoarthritis, right hip: Secondary | ICD-10-CM | POA: Diagnosis not present

## 2018-03-07 DIAGNOSIS — Z471 Aftercare following joint replacement surgery: Secondary | ICD-10-CM | POA: Diagnosis not present

## 2018-03-07 DIAGNOSIS — Z96641 Presence of right artificial hip joint: Secondary | ICD-10-CM | POA: Diagnosis not present

## 2018-03-07 DIAGNOSIS — I2510 Atherosclerotic heart disease of native coronary artery without angina pectoris: Secondary | ICD-10-CM | POA: Diagnosis not present

## 2018-03-07 HISTORY — PX: TOTAL HIP ARTHROPLASTY: SHX124

## 2018-03-07 SURGERY — ARTHROPLASTY, HIP, TOTAL, ANTERIOR APPROACH
Anesthesia: General | Site: Hip | Laterality: Right

## 2018-03-07 MED ORDER — LIDOCAINE HCL (CARDIAC) PF 100 MG/5ML IV SOSY
PREFILLED_SYRINGE | INTRAVENOUS | Status: DC | PRN
  Administered 2018-03-07: 50 mg via INTRAVENOUS

## 2018-03-07 MED ORDER — ALBUMIN HUMAN 5 % IV SOLN
INTRAVENOUS | Status: DC | PRN
  Administered 2018-03-07: 13:00:00 via INTRAVENOUS

## 2018-03-07 MED ORDER — CEFAZOLIN SODIUM-DEXTROSE 2-4 GM/100ML-% IV SOLN
INTRAVENOUS | Status: AC
  Filled 2018-03-07: qty 100

## 2018-03-07 MED ORDER — LORAZEPAM 1 MG PO TABS
1.0000 mg | ORAL_TABLET | Freq: Every evening | ORAL | Status: DC | PRN
  Administered 2018-03-08: 1 mg via ORAL
  Filled 2018-03-07: qty 1

## 2018-03-07 MED ORDER — CHOLECALCIFEROL 125 MCG (5000 UT) PO TABS
1.0000 | ORAL_TABLET | Freq: Every day | ORAL | Status: DC
  Filled 2018-03-07 (×3): qty 1

## 2018-03-07 MED ORDER — FENTANYL CITRATE (PF) 250 MCG/5ML IJ SOLN
INTRAMUSCULAR | Status: AC
  Filled 2018-03-07: qty 5

## 2018-03-07 MED ORDER — MIDAZOLAM HCL 2 MG/2ML IJ SOLN
INTRAMUSCULAR | Status: AC
  Filled 2018-03-07: qty 2

## 2018-03-07 MED ORDER — 0.9 % SODIUM CHLORIDE (POUR BTL) OPTIME
TOPICAL | Status: DC | PRN
  Administered 2018-03-07: 1000 mL

## 2018-03-07 MED ORDER — LACTATED RINGERS IV SOLN
INTRAVENOUS | Status: DC | PRN
  Administered 2018-03-07 (×3): via INTRAVENOUS

## 2018-03-07 MED ORDER — ONDANSETRON HCL 4 MG/2ML IJ SOLN: 4.0000 mg | Freq: Four times a day (QID) | INTRAMUSCULAR | Status: DC | PRN

## 2018-03-07 MED ORDER — CEFAZOLIN SODIUM-DEXTROSE 2-4 GM/100ML-% IV SOLN
2.0000 g | INTRAVENOUS | Status: AC
  Administered 2018-03-07: 2 g via INTRAVENOUS
  Filled 2018-03-07: qty 100

## 2018-03-07 MED ORDER — PHENOL 1.4 % MT LIQD: 1.0000 | OROMUCOSAL | Status: DC | PRN

## 2018-03-07 MED ORDER — PROPOFOL 10 MG/ML IV BOLUS
INTRAVENOUS | Status: DC | PRN
  Administered 2018-03-07: 120 mg via INTRAVENOUS

## 2018-03-07 MED ORDER — METOCLOPRAMIDE HCL 5 MG/ML IJ SOLN: 5.0000 mg | Freq: Three times a day (TID) | INTRAMUSCULAR | Status: DC | PRN

## 2018-03-07 MED ORDER — CEFAZOLIN SODIUM-DEXTROSE 1-4 GM/50ML-% IV SOLN
1.0000 g | Freq: Three times a day (TID) | INTRAVENOUS | Status: AC
  Administered 2018-03-07 (×2): 1 g via INTRAVENOUS
  Filled 2018-03-07 (×3): qty 50

## 2018-03-07 MED ORDER — ROCURONIUM BROMIDE 100 MG/10ML IV SOLN
INTRAVENOUS | Status: DC | PRN
  Administered 2018-03-07: 60 mg via INTRAVENOUS
  Administered 2018-03-07: 10 mg via INTRAVENOUS
  Administered 2018-03-07: 20 mg via INTRAVENOUS

## 2018-03-07 MED ORDER — MEPERIDINE HCL 50 MG/ML IJ SOLN: 6.2500 mg | INTRAMUSCULAR | Status: DC | PRN

## 2018-03-07 MED ORDER — BUPIVACAINE-EPINEPHRINE (PF) 0.25% -1:200000 IJ SOLN
INTRAMUSCULAR | Status: AC
  Filled 2018-03-07: qty 30

## 2018-03-07 MED ORDER — MAGNESIUM 200 MG PO TABS
1.0000 | ORAL_TABLET | Freq: Every day | ORAL | Status: DC
  Administered 2018-03-07: 200 mg via ORAL
  Filled 2018-03-07 (×3): qty 1

## 2018-03-07 MED ORDER — FENTANYL CITRATE (PF) 100 MCG/2ML IJ SOLN
25.0000 ug | INTRAMUSCULAR | Status: DC | PRN
  Administered 2018-03-07 (×3): 50 ug via INTRAVENOUS

## 2018-03-07 MED ORDER — MENTHOL 3 MG MT LOZG: 1.0000 | LOZENGE | OROMUCOSAL | Status: DC | PRN

## 2018-03-07 MED ORDER — FENTANYL CITRATE (PF) 100 MCG/2ML IJ SOLN
INTRAMUSCULAR | Status: AC
  Administered 2018-03-07: 50 ug via INTRAVENOUS
  Filled 2018-03-07: qty 2

## 2018-03-07 MED ORDER — ONDANSETRON HCL 4 MG/2ML IJ SOLN
INTRAMUSCULAR | Status: DC | PRN
  Administered 2018-03-07: 4 mg via INTRAVENOUS

## 2018-03-07 MED ORDER — EPHEDRINE SULFATE 50 MG/ML IJ SOLN
INTRAMUSCULAR | Status: DC | PRN
  Administered 2018-03-07: 5 mg via INTRAVENOUS
  Administered 2018-03-07: 10 mg via INTRAVENOUS

## 2018-03-07 MED ORDER — FINASTERIDE 5 MG PO TABS
5.0000 mg | ORAL_TABLET | Freq: Every day | ORAL | Status: DC
  Administered 2018-03-07 – 2018-03-11 (×5): 5 mg via ORAL
  Filled 2018-03-07 (×5): qty 1

## 2018-03-07 MED ORDER — METOCLOPRAMIDE HCL 5 MG/ML IJ SOLN: 10.0000 mg | Freq: Once | INTRAMUSCULAR | Status: DC | PRN

## 2018-03-07 MED ORDER — POLYETHYLENE GLYCOL 3350 17 G PO PACK
17.0000 g | PACK | Freq: Every day | ORAL | Status: DC | PRN
  Administered 2018-03-08 – 2018-03-10 (×2): 17 g via ORAL
  Filled 2018-03-07 (×2): qty 1

## 2018-03-07 MED ORDER — DILTIAZEM HCL 60 MG PO TABS
60.0000 mg | ORAL_TABLET | Freq: Two times a day (BID) | ORAL | Status: DC
  Administered 2018-03-07 – 2018-03-11 (×8): 60 mg via ORAL
  Filled 2018-03-07 (×8): qty 1

## 2018-03-07 MED ORDER — ASPIRIN EC 325 MG PO TBEC
325.0000 mg | DELAYED_RELEASE_TABLET | Freq: Every day | ORAL | Status: DC
  Administered 2018-03-08 – 2018-03-11 (×4): 325 mg via ORAL
  Filled 2018-03-07 (×4): qty 1

## 2018-03-07 MED ORDER — SUGAMMADEX SODIUM 500 MG/5ML IV SOLN
INTRAVENOUS | Status: DC | PRN
  Administered 2018-03-07: 200 mg via INTRAVENOUS

## 2018-03-07 MED ORDER — HYDROCODONE-ACETAMINOPHEN 5-325 MG PO TABS
1.0000 | ORAL_TABLET | Freq: Four times a day (QID) | ORAL | Status: DC | PRN
  Administered 2018-03-07 – 2018-03-11 (×10): 1 via ORAL
  Filled 2018-03-07 (×11): qty 1

## 2018-03-07 MED ORDER — LACTATED RINGERS IV SOLN
INTRAVENOUS | Status: DC
  Administered 2018-03-07: 09:00:00 via INTRAVENOUS

## 2018-03-07 MED ORDER — FENTANYL CITRATE (PF) 100 MCG/2ML IJ SOLN
INTRAMUSCULAR | Status: DC | PRN
  Administered 2018-03-07: 50 ug via INTRAVENOUS
  Administered 2018-03-07: 200 ug via INTRAVENOUS
  Administered 2018-03-07 (×3): 50 ug via INTRAVENOUS

## 2018-03-07 MED ORDER — CITALOPRAM HYDROBROMIDE 20 MG PO TABS
20.0000 mg | ORAL_TABLET | Freq: Every day | ORAL | Status: DC
  Administered 2018-03-07 – 2018-03-11 (×5): 20 mg via ORAL
  Filled 2018-03-07 (×5): qty 1

## 2018-03-07 MED ORDER — ONDANSETRON HCL 4 MG PO TABS: 4.0000 mg | ORAL_TABLET | Freq: Four times a day (QID) | ORAL | Status: DC | PRN

## 2018-03-07 MED ORDER — CHLORHEXIDINE GLUCONATE 4 % EX LIQD: 60.0000 mL | Freq: Once | CUTANEOUS | Status: DC

## 2018-03-07 MED ORDER — DOCUSATE SODIUM 100 MG PO CAPS
100.0000 mg | ORAL_CAPSULE | Freq: Two times a day (BID) | ORAL | Status: DC
  Administered 2018-03-07 – 2018-03-11 (×8): 100 mg via ORAL
  Filled 2018-03-07 (×8): qty 1

## 2018-03-07 MED ORDER — SODIUM CHLORIDE 0.9 % IV SOLN
INTRAVENOUS | Status: DC
  Administered 2018-03-07: 19:00:00 via INTRAVENOUS

## 2018-03-07 MED ORDER — PHENYLEPHRINE HCL 10 MG/ML IJ SOLN
INTRAMUSCULAR | Status: DC | PRN
  Administered 2018-03-07: 40 ug via INTRAVENOUS
  Administered 2018-03-07: 80 ug via INTRAVENOUS
  Administered 2018-03-07: 160 ug via INTRAVENOUS
  Administered 2018-03-07: 40 ug via INTRAVENOUS
  Administered 2018-03-07: 120 ug via INTRAVENOUS
  Administered 2018-03-07: 40 ug via INTRAVENOUS

## 2018-03-07 MED ORDER — TRANEXAMIC ACID 1000 MG/10ML IV SOLN
1000.0000 mg | INTRAVENOUS | Status: AC
  Administered 2018-03-07: 1000 mg via INTRAVENOUS
  Filled 2018-03-07: qty 10

## 2018-03-07 MED ORDER — METOCLOPRAMIDE HCL 5 MG PO TABS: 5.0000 mg | ORAL_TABLET | Freq: Three times a day (TID) | ORAL | Status: DC | PRN

## 2018-03-07 MED ORDER — PROPOFOL 10 MG/ML IV BOLUS
INTRAVENOUS | Status: AC
  Filled 2018-03-07: qty 20

## 2018-03-07 SURGICAL SUPPLY — 46 items
BENZOIN TINCTURE PRP APPL 2/3 (GAUZE/BANDAGES/DRESSINGS) ×2 IMPLANT
BLADE CLIPPER SURG (BLADE) IMPLANT
BLADE SAW SGTL 18X1.27X75 (BLADE) ×2 IMPLANT
CAPT HIP TOTAL 2 ×2 IMPLANT
CELLS DAT CNTRL 66122 CELL SVR (MISCELLANEOUS) ×1 IMPLANT
COVER SURGICAL LIGHT HANDLE (MISCELLANEOUS) ×2 IMPLANT
DRAPE C-ARM 42X72 X-RAY (DRAPES) ×2 IMPLANT
DRAPE IMP U-DRAPE 54X76 (DRAPES) ×2 IMPLANT
DRAPE STERI IOBAN 125X83 (DRAPES) ×2 IMPLANT
DRAPE U-SHAPE 47X51 STRL (DRAPES) ×6 IMPLANT
DRSG MEPILEX BORDER 4X12 (GAUZE/BANDAGES/DRESSINGS) ×2 IMPLANT
DRSG MEPILEX BORDER 4X8 (GAUZE/BANDAGES/DRESSINGS) ×2 IMPLANT
DURAPREP 26ML APPLICATOR (WOUND CARE) ×2 IMPLANT
ELECT BLADE 4.0 EZ CLEAN MEGAD (MISCELLANEOUS)
ELECT CAUTERY BLADE 6.4 (BLADE) ×2 IMPLANT
ELECT REM PT RETURN 9FT ADLT (ELECTROSURGICAL) ×2
ELECTRODE BLDE 4.0 EZ CLN MEGD (MISCELLANEOUS) IMPLANT
ELECTRODE REM PT RTRN 9FT ADLT (ELECTROSURGICAL) ×1 IMPLANT
FACESHIELD WRAPAROUND (MASK) ×4 IMPLANT
GLOVE BIOGEL PI IND STRL 8 (GLOVE) ×2 IMPLANT
GLOVE BIOGEL PI INDICATOR 8 (GLOVE) ×2
GLOVE ORTHO TXT STRL SZ7.5 (GLOVE) ×4 IMPLANT
GOWN STRL REUS W/ TWL LRG LVL3 (GOWN DISPOSABLE) ×1 IMPLANT
GOWN STRL REUS W/ TWL XL LVL3 (GOWN DISPOSABLE) ×1 IMPLANT
GOWN STRL REUS W/TWL 2XL LVL3 (GOWN DISPOSABLE) ×2 IMPLANT
GOWN STRL REUS W/TWL LRG LVL3 (GOWN DISPOSABLE) ×1
GOWN STRL REUS W/TWL XL LVL3 (GOWN DISPOSABLE) ×1
KIT BASIN OR (CUSTOM PROCEDURE TRAY) ×2 IMPLANT
KIT TURNOVER KIT B (KITS) ×2 IMPLANT
MANIFOLD NEPTUNE II (INSTRUMENTS) ×2 IMPLANT
NS IRRIG 1000ML POUR BTL (IV SOLUTION) ×2 IMPLANT
PACK TOTAL JOINT (CUSTOM PROCEDURE TRAY) ×2 IMPLANT
PAD ARMBOARD 7.5X6 YLW CONV (MISCELLANEOUS) ×4 IMPLANT
RTRCTR WOUND ALEXIS 18CM MED (MISCELLANEOUS) ×2
STRIP CLOSURE SKIN 1/2X4 (GAUZE/BANDAGES/DRESSINGS) ×2 IMPLANT
SUT VIC AB 0 CT1 27 (SUTURE) ×1
SUT VIC AB 0 CT1 27XBRD ANBCTR (SUTURE) ×1 IMPLANT
SUT VIC AB 2-0 CT1 27 (SUTURE) ×1
SUT VIC AB 2-0 CT1 TAPERPNT 27 (SUTURE) ×1 IMPLANT
SUT VICRYL 4-0 PS2 18IN ABS (SUTURE) ×2 IMPLANT
SUT VLOC 180 0 24IN GS25 (SUTURE) ×2 IMPLANT
TOWEL OR 17X24 6PK STRL BLUE (TOWEL DISPOSABLE) ×2 IMPLANT
TOWEL OR 17X26 10 PK STRL BLUE (TOWEL DISPOSABLE) ×4 IMPLANT
TRAY CATH 16FR W/PLASTIC CATH (SET/KITS/TRAYS/PACK) IMPLANT
TRAY FOLEY MTR SLVR 16FR STAT (SET/KITS/TRAYS/PACK) IMPLANT
WATER STERILE IRR 1000ML POUR (IV SOLUTION) ×4 IMPLANT

## 2018-03-07 NOTE — Interval H&P Note (Signed)
History and Physical Interval Note:  03/07/2018 10:05 AM  [name] Yu  has presented today for surgery, with the diagnosis of RIGHT HIP OSTEOARTHRITIS  The various methods of treatment have been discussed with the patient and family. After consideration of risks, benefits and other options for treatment, the patient has consented to  Procedure(s): RIGHT TOTAL HIP ARTHROPLASTY DIRECT ANTERIOR (Right) as a surgical intervention .  The patient's history has been reviewed, patient examined, no change in status, stable for surgery.  I have reviewed the patient's chart and labs.  Questions were answered to the patient's satisfaction.     [name]

## 2018-03-07 NOTE — Progress Notes (Signed)
Orthopedic Tech Progress Note Patient Details:  [name] Yu Mar 20, 1927 [id number]  Patient ID: [name] Yu, male   DOB: 1927-08-13, 84 y.o.   MRN: [id number] Pt cant have ohf due to age restrictions.  Karolee Stamps 03/07/2018, 6:56 PM

## 2018-03-07 NOTE — Op Note (Signed)
Preop diagnosis: Right hip osteoarthritis  Postop diagnosis: Right hip primary osteoarthritis.  Procedure: Right total hip arthroplasty direct anterior approach.Depuy gription series 100 56 mm cup, apex hole eliminator, +4 poly-neutral liner.  36 mm ball +1.5 mm neck. Actis 12/14 Duofix stem.  Surgeon: Rodell Perna, MD  Assistant: Benjiman Core, PA-C medically necessary and present for the entire procedure  Anesthesia: General plus Marcaine local  EBL see anesthetic record  Procedure after induction of general anesthesia standard prepping and draping with patient on the [place] table C-arm was brought in to make sure good visualization of both hips were seen.  Hip was prepped after 1015 drapes x2 were applied with DuraPrep after dried timeout procedure completed large shower curtain Betadine Steri-Drape was applied after sterile skin marker and half sheet above and crosstable.  Ancef was given prophylactically.  Direct anterior oblique incision was made from ASIS down to the trochanter.  Fascia was split developed medially transverse bleeders were coagulated.  Dull cover was placed gently over the capsule anterior capsule was opened and then second toe cup was placed over the top of the neck.  Neck was divided under C arm visualization completing the superior aspect with a stump protector trochanter.  Head was removed with a corkscrew sequential reaming was performed to size 55 and then 56 cup was selected no hole and then after securement under C arm to make sure cup flexion and abduction was appropriate it was impacted into place.  Apex hole eliminator +4 neutral liner.  Hydraulic hook applied leg was taken down and under Mueller retractor medially.  Long Hohmann retractor was placed behind the trochanter.  Posterior capsule was divided for mobilization of the femur sequential progression and sizes after cookie-cutter, curette rondure and then sequential broaching until there was excellent tight fit.   Trials were placed.  Initially was a little bit long we reamed the neck down to where there is an 11 mm neck.  In this position the hip was a little bit loose slightly more toggle  with shucking.  With the external rotation 8090 degrees it would begin to sublux anteriorly.  Stammers was removed cup was checked under fluoroscopy stem was replaced putting in it is much internal rotation as possible with the GEO metric shape of the stem limitations.  Collar was done on the calcar.  Repeat stability testing showed there is some improvement.  With extension of 45 degrees external rotation to 70 degrees hip would begin to sublux anteriorly.  After repeat irrigation checking for arterial bleeders the lot closure of the fascia to on subtenons tissue skin staple closure and a postop dressing instrument count needle count was correct.

## 2018-03-07 NOTE — Transfer of Care (Signed)
Immediate Anesthesia Transfer of Care Note  Patient: [name] Yu  Procedure(s) Performed: RIGHT TOTAL HIP ARTHROPLASTY DIRECT ANTERIOR (Right Hip)  Patient Location: PACU  Anesthesia Type:General  Level of Consciousness: awake, drowsy, pateint uncooperative, confused, lethargic and responds to stimulation  Airway & Oxygen Therapy: Patient Spontanous Breathing and Patient connected to face mask oxygen  Post-op Assessment: Report given to RN, Post -op Vital signs reviewed and stable and Patient moving all extremities  Post vital signs: Reviewed and stable  Last Vitals:  Vitals Value Taken Time  BP 123/76 03/07/2018  1:52 PM  Temp 36.5 C 03/07/2018  1:53 PM  Pulse 79 03/07/2018  1:54 PM  Resp 13 03/07/2018  1:54 PM  SpO2 99 % 03/07/2018  1:54 PM  Vitals shown include unvalidated device data.  Last Pain:  Vitals:   03/07/18 1353  TempSrc:   PainSc: Asleep         Complications: No apparent anesthesia complications

## 2018-03-07 NOTE — Anesthesia Procedure Notes (Signed)
Procedure Name: Intubation Date/Time: 03/07/2018 10:57 AM Performed by: [name], MD Pre-anesthesia Checklist: Patient identified, Emergency Drugs available, Suction available, Patient being monitored and Timeout performed Patient Re-evaluated:Patient Re-evaluated prior to induction Oxygen Delivery Method: Circle system utilized Preoxygenation: Pre-oxygenation with 100% oxygen Induction Type: IV induction and Cricoid Pressure applied Ventilation: Mask ventilation without difficulty Laryngoscope Size: Mac and 4 Grade View: Grade II Tube type: Oral Tube size: 7.5 mm Number of attempts: 1 Airway Equipment and Method: Stylet Placement Confirmation: ETT inserted through vocal cords under direct vision,  positive ETCO2 and breath sounds checked- equal and bilateral Secured at: 23 cm Tube secured with: Tape Dental Injury: Teeth and Oropharynx as per pre-operative assessment  Difficulty Due To: Difficulty was anticipated, Difficult Airway- due to limited oral opening and Difficult Airway- due to anterior larynx Future Recommendations: Recommend- induction with short-acting agent, and alternative techniques readily available

## 2018-03-07 NOTE — Anesthesia Preprocedure Evaluation (Signed)
Anesthesia Evaluation  Patient identified by MRN, date of birth, ID band Patient awake    Reviewed: Allergy & Precautions, NPO status , Patient's Chart, lab work & pertinent test results  Airway Mallampati: II  TM Distance: >3 FB Neck ROM: Full    Dental no notable dental hx.    Pulmonary sleep apnea , former smoker,    Pulmonary exam normal breath sounds clear to auscultation       Cardiovascular hypertension, Pt. on medications Normal cardiovascular exam+ dysrhythmias Atrial Fibrillation  Rhythm:Regular Rate:Normal     Neuro/Psych Anxiety Depression negative neurological ROS     GI/Hepatic negative GI ROS, Neg liver ROS,   Endo/Other  negative endocrine ROS  Renal/GU negative Renal ROS  negative genitourinary   Musculoskeletal negative musculoskeletal ROS (+)   Abdominal   Peds negative pediatric ROS (+)  Hematology negative hematology ROS (+)   Anesthesia Other Findings   Reproductive/Obstetrics negative OB ROS                             Anesthesia Physical Anesthesia Plan  ASA: II  Anesthesia Plan: General   Post-op Pain Management:    Induction: Intravenous  PONV Risk Score and Plan: 1 and Ondansetron  Airway Management Planned: Simple Face Mask  Additional Equipment:   Intra-op Plan:   Post-operative Plan:   Informed Consent: I have reviewed the patients History and Physical, chart, labs and discussed the procedure including the risks, benefits and alternatives for the proposed anesthesia with the patient or authorized representative who has indicated his/her understanding and acceptance.   Dental advisory given  Plan Discussed with: CRNA  Anesthesia Plan Comments: (Spinal stenosis. Not a candidate for SAB.)        Anesthesia Quick Evaluation

## 2018-03-08 LAB — CBC
HEMATOCRIT: 35.3 % — AB (ref 39.0–52.0)
HEMOGLOBIN: 11.6 g/dL — AB (ref 13.0–17.0)
MCH: 31.5 pg (ref 26.0–34.0)
MCHC: 32.9 g/dL (ref 30.0–36.0)
MCV: 95.9 fL (ref 78.0–100.0)
Platelets: 246 10*3/uL (ref 150–400)
RBC: 3.68 MIL/uL — ABNORMAL LOW (ref 4.22–5.81)
RDW: 13.2 % (ref 11.5–15.5)
WBC: 14.5 10*3/uL — AB (ref 4.0–10.5)

## 2018-03-08 LAB — BASIC METABOLIC PANEL
Anion gap: 9 (ref 5–15)
BUN: 9 mg/dL (ref 6–20)
CHLORIDE: 102 mmol/L (ref 101–111)
CO2: 26 mmol/L (ref 22–32)
Calcium: 8.6 mg/dL — ABNORMAL LOW (ref 8.9–10.3)
Creatinine, Ser: 0.86 mg/dL (ref 0.61–1.24)
GFR calc non Af Amer: >60 mL/min (ref >60)
Glucose, Bld: 134 mg/dL — ABNORMAL HIGH (ref 65–99)
POTASSIUM: 3.6 mmol/L (ref 3.5–5.1)
Sodium: 137 mmol/L (ref 135–145)

## 2018-03-08 MED ORDER — MAGNESIUM OXIDE 400 (241.3 MG) MG PO TABS
200.0000 mg | ORAL_TABLET | Freq: Every day | ORAL | Status: DC
Start: 2018-03-08 — End: 2018-03-11
  Administered 2018-03-08 – 2018-03-11 (×4): 200 mg via ORAL
  Filled 2018-03-08 (×4): qty 1

## 2018-03-08 MED ORDER — VITAMIN D 1000 UNITS PO TABS
5000.0000 [IU] | ORAL_TABLET | Freq: Every day | ORAL | Status: DC
Start: 2018-03-08 — End: 2018-03-11
  Administered 2018-03-08 – 2018-03-11 (×4): 5000 [IU] via ORAL
  Filled 2018-03-08 (×4): qty 5

## 2018-03-08 NOTE — Anesthesia Postprocedure Evaluation (Signed)
Anesthesia Post Note  Patient: [name] Yu  Procedure(s) Performed: RIGHT TOTAL HIP ARTHROPLASTY DIRECT ANTERIOR (Right Hip)     Patient location during evaluation: PACU Anesthesia Type: General Level of consciousness: awake and alert Pain management: pain level controlled Vital Signs Assessment: post-procedure vital signs reviewed and stable Respiratory status: spontaneous breathing, nonlabored ventilation, respiratory function stable and patient connected to nasal cannula oxygen Cardiovascular status: blood pressure returned to baseline and stable Postop Assessment: no apparent nausea or vomiting Anesthetic complications: no    Last Vitals:  Vitals:   03/07/18 2255 03/08/18 0338  BP: 128/71 110/64  Pulse: 92 (!) 105  Resp: 16   Temp: 37 C 36.9 C  SpO2: 100% 93%    Last Pain:  Vitals:   03/08/18 0730  TempSrc:   PainSc: 0-No pain                 [name]

## 2018-03-08 NOTE — Evaluation (Signed)
Physical Therapy Evaluation Patient Details Name: [name] Yu MRN: [id number] DOB: 09/03/1927 Today's Date: 03/08/2018   History of Present Illness  84 y.o. male admitted for elective R direct anterior THA.  Pt with significant PMH ofpre diabetes, HTN, dysrhythmia, and Afib/flutter.  Clinical Impression  Pt needed significant help getting OOB to chair and ultimately we used the standing frame as, once standing he was unable to move either foot forward with RW.  Two people assist to the chair in standing frame.  Pt will likely need extensive post acute rehab before returning home.   PT to follow acutely for deficits listed below.       Follow Up Recommendations SNF;Follow surgeon's recommendation for DC plan and follow-up therapies;Supervision/Assistance - 24 hour    Equipment Recommendations  Hospital bed    Recommendations for Other Services   NA    Precautions / Restrictions Precautions Precautions: Fall Restrictions Weight Bearing Restrictions: Yes RLE Weight Bearing: Weight bearing as tolerated      Mobility  Bed Mobility Overal bed mobility: Needs Assistance Bed Mobility: Supine to Sit     Supine to sit: Mod assist;HOB elevated     General bed mobility comments: Heavy mod assist to help progress bil legs to EOB, move trunk and get pt in a position where he can pull wiht both hands against the rail with bed HOB maximally elevated.  Pt needed significant extra time to complete this move out of the R side of the bed (simulating home).   Transfers Overall transfer level: Needs assistance Equipment used: Rolling walker (2 wheeled) Transfers: Sit to/from [facility] Sit to Stand: Mod assist;From elevated surface;+2 physical assistance         General transfer comment: Mod assist to stand twice from maximally elevated bed.  x1 with one person assist and RW and x1 with two person assist and standing frame.  Used standing frame to transfer to the chair.   Two people needed to do this safely and to help control descent to low chair.   Ambulation/Gait             General Gait Details: attempted pre gait EOB, but pt unable to move right leg forward or bear weight on R leg to move L leg forward.          Balance Overall balance assessment: Needs assistance Sitting-balance support: Feet supported;Bilateral upper extremity supported Sitting balance-Leahy Scale: Fair Sitting balance - Comments: supervision EOB with bil hand prop.   Standing balance support: Bilateral upper extremity supported Standing balance-Leahy Scale: Poor Standing balance comment: mod assist with external support from RW and/or standing frame.                              Pertinent Vitals/Pain Pain Assessment: Faces Pain Score: 4 (at rest) Faces Pain Scale: Hurts worst Pain Location: right thigh Pain Descriptors / Indicators: Aching;Burning Pain Intervention(s): Limited activity within patient's tolerance;Monitored during session;Repositioned;Premedicated before session;Ice applied    Home Living Family/patient expects to be discharged to:: Private residence Living Arrangements: Spouse/significant other Available Help at Discharge: Family;Available 24 hours/day Type of Home: House Home Access: Stairs to enter Entrance Stairs-Rails: Right Entrance Stairs-Number of Steps: 2 Home Layout: One level Home Equipment: Walker - 2 wheels;Cane - single point;Bedside commode;Grab bars - tub/shower;Grab bars - toilet      Prior Function Level of Independence: Needs assistance   Gait / Transfers Assistance Needed: walked with  a cane           Hand Dominance   Dominant Hand: Right    Extremity/Trunk Assessment   Upper Extremity Assessment Upper Extremity Assessment: Overall WFL for tasks assessed    Lower Extremity Assessment Lower Extremity Assessment: RLE deficits/detail RLE Deficits / Details: right leg externally rotated and knee  flexed at rest.  Pt able to wiggle ankle at least 3/5, knee 2+/5, hip 2/5 per gross functional assessment.  RLE: Unable to fully assess due to pain RLE Sensation: WNL    Cervical / Trunk Assessment Cervical / Trunk Assessment: Normal  Communication      Cognition Arousal/Alertness: Awake/alert Behavior During Therapy: WFL for tasks assessed/performed Overall Cognitive Status: Within Functional Limits for tasks assessed                                           Exercises Total Joint Exercises Ankle Circles/Pumps: AAROM;Both;20 reps Quad Sets: AROM;Right;10 reps Heel Slides: AAROM;Right;10 reps Hip ABduction/ADduction: AAROM;Right;10 reps   Assessment/Plan    PT Assessment Patient needs continued PT services  PT Problem List Decreased strength;Decreased range of motion;Decreased activity tolerance;Decreased balance;Decreased mobility;Decreased knowledge of use of DME;Pain       PT Treatment Interventions DME instruction;Gait training;Stair training;Therapeutic activities;Functional mobility training;Therapeutic exercise;Balance training;Patient/family education;Manual techniques;Modalities    PT Goals (Current goals can be found in the Care Plan section)  Acute Rehab PT Goals Patient Stated Goal: to decrease the pain PT Goal Formulation: With patient/family Time For Goal Achievement: 03/15/18 Potential to Achieve Goals: Fair    Frequency 7X/week    AM-PAC PT "6 Clicks" Daily Activity  Outcome Measure Difficulty turning over in bed (including adjusting bedclothes, sheets and blankets)?: Unable Difficulty moving from lying on back to sitting on the side of the bed? : Unable Difficulty sitting down on and standing up from a chair with arms (e.g., wheelchair, bedside commode, etc,.)?: Unable Help needed moving to and from a bed to chair (including a wheelchair)?: A Lot Help needed walking in hospital room?: Total Help needed climbing 3-5 steps with a  railing? : Total 6 Click Score: 7    End of Session   Activity Tolerance: Patient limited by pain Patient left: in chair;with call bell/phone within reach Nurse Communication: Mobility status;Need for lift equipment(to RN tech) PT Visit Diagnosis: Muscle weakness (generalized) (M62.81);Difficulty in walking, not elsewhere classified (R26.2);Pain Pain - Right/Left: Right Pain - part of body: Hip    Time: 9450-3888(KC taken [phone] and came back to mobilize [phone]) PT Time Calculation (min) (ACUTE ONLY): 114 min   Charges:          Wells Guiles B. Timur Nibert, PT, DPT [phone]   PT Evaluation $PT Eval Moderate Complexity: 1 Mod PT Treatments $Therapeutic Exercise: 8-22 mins $Therapeutic Activity: 8-22 mins   03/08/2018, 9:41 AM

## 2018-03-08 NOTE — Progress Notes (Signed)
Subjective: 1 Day Post-Op Procedure(s) (LRB): RIGHT TOTAL HIP ARTHROPLASTY DIRECT ANTERIOR (Right) Patient reports pain as moderate.    Objective: Vital signs in last 24 hours: Temp:  [97.6 F (36.4 C)-98.6 F (37 C)] 98.4 F (36.9 C) (05/25 0338) Pulse Rate:  [76-105] 105 (05/25 0338) Resp:  [13-23] 16 (05/24 2255) BP: (102-157)/(56-85) 110/64 (05/25 0338) SpO2:  [90 %-100 %] 93 % (05/25 0338)  Intake/Output from previous day: 05/24 0701 - 05/25 0700 In: 2750 [I.V.:2500; IV Piggyback:250] Out: 3220 [Urine:2220; Blood:1000] Intake/Output this shift: No intake/output data recorded.  Recent Labs    03/08/18 0438  HGB 11.6*   Recent Labs    03/08/18 0438  WBC 14.5*  RBC 3.68*  HCT 35.3*  PLT 246   Recent Labs    03/08/18 0438  NA 137  K 3.6  CL 102  CO2 26  BUN 9  CREATININE 0.86  GLUCOSE 134*  CALCIUM 8.6*   No results for input(s): LABPT, INR in the last 72 hours.  Sensation intact distally Intact pulses distally Dorsiflexion/Plantar flexion intact Incision: dressing C/D/I  Assessment/Plan: 1 Day Post-Op Procedure(s) (LRB): RIGHT TOTAL HIP ARTHROPLASTY DIRECT ANTERIOR (Right) Up with therapy  Discharge pending his comfort as well as safety and mobility with therapy    Mcarthur Rossetti 03/08/2018, 8:50 AM

## 2018-03-08 NOTE — Anesthesia Postprocedure Evaluation (Deleted)
Anesthesia Post Note  Patient: [name] Yu  Procedure(s) Performed: RIGHT TOTAL HIP ARTHROPLASTY DIRECT ANTERIOR (Right Hip)     Anesthesia Type: General    Last Vitals:  Vitals:   03/07/18 2255 03/08/18 0338  BP: 128/71 110/64  Pulse: 92 (!) 105  Resp: 16   Temp: 37 C 36.9 C  SpO2: 100% 93%    Last Pain:  Vitals:   03/08/18 0730  TempSrc:   PainSc: 0-No pain                 [name]

## 2018-03-08 NOTE — Care Management Note (Signed)
Case Management Note  Patient Details  Name: RAJESH WYSS MRN: [id number] Date of Birth: 1927/06/02  Subjective/Objective:   84 yr old gentleman s/p right total hip arthroplasty.                 Action/Plan: Case manager spoke with patient and wife concerning discharge plan. Presently waiting to be evaluated by physical therapy. Patient has RW and 3in1 at home. CM will continue to follow for [facility] needs, if any,    Expected Discharge Date:    pending              Expected Discharge Plan:     In-House Referral:  NA  Discharge planning Services  CM Consult  Post Acute Care Choice:    Choice offered to:  Spouse, Patient  DME Arranged:  N/A(Has RW and 3in1) DME Agency:  NA  HH Arranged:    [place] Agency:     Status of Service:  In process, will continue to follow  If discussed at Long Length of Stay Meetings, dates discussed:    Additional Comments:  Ninfa Meeker, RN 03/08/2018, 9:06 AM

## 2018-03-09 LAB — CBC
HCT: 32.6 % — ABNORMAL LOW (ref 39.0–52.0)
HEMOGLOBIN: 10.4 g/dL — AB (ref 13.0–17.0)
MCH: 30.9 pg (ref 26.0–34.0)
MCHC: 31.9 g/dL (ref 30.0–36.0)
MCV: 96.7 fL (ref 78.0–100.0)
PLATELETS: 220 10*3/uL (ref 150–400)
RBC: 3.37 MIL/uL — AB (ref 4.22–5.81)
RDW: 13.3 % (ref 11.5–15.5)
WBC: 15.6 10*3/uL — AB (ref 4.0–10.5)

## 2018-03-09 NOTE — Progress Notes (Signed)
Subjective: 2 Days Post-Op Procedure(s) (LRB): RIGHT TOTAL HIP ARTHROPLASTY DIRECT ANTERIOR (Right) Patient reports pain as moderate.  No complaints.   Objective: Vital signs in last 24 hours: Temp:  [98.1 F (36.7 C)-99.4 F (37.4 C)] 98.1 F (36.7 C) (05/26 0555) Pulse Rate:  [77-81] 81 (05/26 0555) BP: (106-112)/(55-60) 112/55 (05/26 0555) SpO2:  [91 %-94 %] 94 % (05/26 0555)  Intake/Output from previous day: 05/25 0701 - 05/26 0700 In: 1725.5 [I.V.:1725.5] Out: -  Intake/Output this shift: No intake/output data recorded.  Recent Labs    03/08/18 0438 03/09/18 0525  HGB 11.6* 10.4*   Recent Labs    03/08/18 0438 03/09/18 0525  WBC 14.5* 15.6*  RBC 3.68* 3.37*  HCT 35.3* 32.6*  PLT 246 220   Recent Labs    03/08/18 0438  NA 137  K 3.6  CL 102  CO2 26  BUN 9  CREATININE 0.86  GLUCOSE 134*  CALCIUM 8.6*   No results for input(s): LABPT, INR in the last 72 hours.  Sensation intact distally Intact pulses distally Dorsiflexion/Plantar flexion intact Incision: dressing C/D/I Compartment soft    Assessment/Plan: 2 Days Post-Op Procedure(s) (LRB): RIGHT TOTAL HIP ARTHROPLASTY DIRECT ANTERIOR (Right) Up with therapy slow progress.      GILBERT Bordley 03/09/2018, 8:04 AM

## 2018-03-09 NOTE — Progress Notes (Signed)
Per PA at bedside this am, foley is to remain in place till pt is re-evaluated tomorrow morning 03/10/2018. Will continue to monitor.

## 2018-03-09 NOTE — Clinical Social Work Note (Signed)
Clinical Social Work Assessment  Patient Details  Name: [name] Yu MRN: [id number] Date of Birth: 02-May-1927  Date of referral:  03/09/18               Reason for consult:  Facility Placement                Permission sought to share information with:  Family Supports Permission granted to share information::  Yes, Release of Information Signed  Name::     [name] Yu  Agency::     Relationship::  Wife  Contact Information:  [phone]  Housing/Transportation Living arrangements for the past 2 months:  Single Family Home Source of Information:  Spouse Patient Interpreter Needed:  None Criminal Activity/Legal Involvement Pertinent to Current Situation/Hospitalization:  No - Comment as needed Significant Relationships:  Spouse, Adult Children Lives with:  Spouse Do you feel safe going back to the place where you live?  No Need for family participation in patient care:  No (Coment)  Care giving concerns:  Pt is only alert to self and place. CSW spoke with pt's spouse via telephone.   Social Worker assessment / plan:  CSW spoke with pt's spouse via telephone. Pt was living at home with spouse prior to admission. Pt's wife states pt has VA benefits and would like for CSW to look into that. Pt's wife would prefer pt to go to SNF in [place]; [place]. CSW explained the VA is contracted to only certain facility. CSW to follow up with spouse.  Employment status:  Retired Nurse, adult, VA Benefit PT Recommendations:  [place] / Referral to community resources:  [place]  Patient/Family's Response to care:  Pt's spouse verbalized understanding of CSW role and expressed appreciation for support. Pt's spouse denies any concern regarding pt care at this time.   Patient/Family's Understanding of and Emotional Response to Diagnosis, Current Treatment, and Prognosis:  Pt's spouse understanding and realistic regarding  pt's physical limitations. Pt's spouse understands the need for pt to go to SNF placement at d/c. Pt's wife agreeable to SNF placement for pt at d/c, at this time.  Pt's spouse denies any concern regarding pt's treatment plan at this time. CSW will continue to provide support and facilitate d/c needs.   Emotional Assessment Appearance:  Appears stated age Attitude/Demeanor/Rapport:  Unable to Assess Affect (typically observed):  Unable to Assess Orientation:  Oriented to Self, Oriented to Place Alcohol / Substance use:  Not Applicable Psych involvement (Current and /or in the community):  No (Comment)  Discharge Needs  Concerns to be addressed:  Basic Needs, Care Coordination Readmission within the last 30 days:  No Current discharge risk:  Dependent with Mobility Barriers to Discharge:  Continued Medical Work up, [employer], LCSW 03/09/2018, 11:47 AM

## 2018-03-09 NOTE — Progress Notes (Signed)
Physical Therapy Treatment Patient Details Name: [name] Yu MRN: [id number] DOB: [birth year] Today's Date: 03/09/2018    History of Present Illness 84 y.o. male admitted for elective R direct anterior THA.  Pt with significant PMH ofpre diabetes, HTN, dysrhythmia, and Afib/flutter.    PT Comments    Pt is progressing with his gait today and able to make it with mod assist and chair to follow to the hallway.  Wife encouraged [place] to explain to him that post surgical pain is normal and we spent a bit of time explaining that the things he is feeling is normal.  He did better today compared to yesterday, but remains appropriate for SNF level rehab at discharge.   Follow Up Recommendations  SNF;Follow surgeon's recommendation for DC plan and follow-up therapies;Supervision/Assistance - 24 hour     Equipment Recommendations  Hospital bed    Recommendations for Other Services   NA     Precautions / Restrictions Precautions Precautions: Fall Restrictions Weight Bearing Restrictions: Yes RLE Weight Bearing: Weight bearing as tolerated    Mobility  Bed Mobility Overal bed mobility: Needs Assistance Bed Mobility: Supine to Sit     Supine to sit: Mod assist;HOB elevated     General bed mobility comments: Mod assist to help pt move to EOB progressing bil legs and basic verbal cues for 1/2 bridge.  HOB elevated and heavy use of rails.  We came out of the right side of the bed as this is the side he uses at home.    Transfers Overall transfer level: Needs assistance Equipment used: Rolling walker (2 wheeled) Transfers: Sit to/from Stand Sit to Stand: Min assist;From elevated surface         General transfer comment: Min assist to come to standing, pt cued for safe hand placement when going to sit, but likes to put both hands on RW to stand (will need to work on this).    Ambulation/Gait Ambulation/Gait assistance: Mod assist;+2 safety/equipment Ambulation Distance (Feet): 10  Feet Assistive device: Rolling walker (2 wheeled) Gait Pattern/deviations: Step-to pattern;Antalgic;Trunk flexed     General Gait Details: Pt was able to progress gait to the hallway today with mod assist to support trunk for balance and to keep RW from going too far forward.  Initially needed assist to help progress right leg forward in the RW, but then after a few steps pt was doing this without help.           Balance Overall balance assessment: Needs assistance Sitting-balance support: Feet supported;No upper extremity supported;Bilateral upper extremity supported;Single extremity supported Sitting balance-Leahy Scale: Fair   Postural control: Posterior lean Standing balance support: Bilateral upper extremity supported Standing balance-Leahy Scale: Poor Standing balance comment: needs external support from PT and RW                            Cognition Arousal/Alertness: Awake/alert Behavior During Therapy: WFL for tasks assessed/performed Overall Cognitive Status: Impaired/Different from baseline Area of Impairment: Memory                     Memory: Decreased short-term memory                Exercises Total Joint Exercises Ankle Circles/Pumps: AAROM;Both;20 reps Quad Sets: AROM;Right;10 reps Heel Slides: AAROM;Right;10 reps Hip ABduction/ADduction: AAROM;Right;10 reps        Pertinent Vitals/Pain Pain Assessment: Faces Faces Pain Scale: Hurts whole lot Pain Location:  right lateral thigh Pain Descriptors / Indicators: Grimacing;Guarding Pain Intervention(s): Limited activity within patient's tolerance;Premedicated before session;Monitored during session;Repositioned;Ice applied           PT Goals (current goals can now be found in the care plan section) Acute Rehab PT Goals Patient Stated Goal: to decrease the pain Progress towards PT goals: Progressing toward goals    Frequency    7X/week      PT Plan Current plan remains  appropriate       AM-PAC PT "6 Clicks" Daily Activity  Outcome Measure  Difficulty turning over in bed (including adjusting bedclothes, sheets and blankets)?: Unable Difficulty moving from lying on back to sitting on the side of the bed? : Unable Difficulty sitting down on and standing up from a chair with arms (e.g., wheelchair, bedside commode, etc,.)?: Unable Help needed moving to and from a bed to chair (including a wheelchair)?: A Little Help needed walking in hospital room?: A Lot Help needed climbing 3-5 steps with a railing? : Total 6 Click Score: 9    End of Session   Activity Tolerance: Patient limited by pain Patient left: in chair;with family/visitor present;with call bell/phone within reach Nurse Communication: Mobility status(to RN tech) PT Visit Diagnosis: Muscle weakness (generalized) (M62.81);Difficulty in walking, not elsewhere classified (R26.2);Pain Pain - Right/Left: Right Pain - part of body: Hip     Time: 7253-6644 PT Time Calculation (min) (ACUTE ONLY): 41 min  Charges:  $Therapeutic Exercise: 8-22 mins $Therapeutic Activity: 23-37 mins          Admiral Marcucci B. [place]row, [place], DPT [phone]            03/09/2018, 11:42 AM

## 2018-03-09 NOTE — NC FL2 (Signed)
[place] LEVEL OF CARE SCREENING TOOL     IDENTIFICATION  Patient Name: [name] Yu Birthdate: April 29, 1927 Sex: male Admission Date (Current Location): 03/07/2018  [facility] and [place] Number:  Herbalist and Address:  [facility], [place] [address] [place], [place] [address]      Provider Number: [id number]  Attending Physician Name and Address:  [name], MD  Relative Name and Phone Number:       Current Level of Care: Hospital Recommended Level of Care: [place] Prior Approval Number:    Date Approved/Denied:   PASRR Number: [id number]  Discharge Plan: SNF    Current Diagnoses: Patient Active Problem List   Diagnosis Date Noted  . Unilateral primary osteoarthritis, right hip 03/07/2018  . Arthritis of right hip 03/07/2018  . BPH (benign prostatic hyperplasia) 02/26/2018  . Anxiety 10/16/2017  . CAD (coronary artery disease) 12/28/2015  . BMI 26.0-26.9,adult 08/21/2015  . Encounter for Medicare annual wellness exam 04/27/2015  . Vitamin D deficiency 03/04/2014  . Medication management 02/17/2014  . ASHD (arteriosclerotic heart disease)   . Depression, major, in remission ([place])   . Diverticulosis   . Personal history of colonic polyps 12/29/2012  . Hypertension   . Other abnormal glucose   . Mixed hyperlipidemia   . OSA (obstructive sleep apnea)   . Atrial fibrillation ([place])     Orientation RESPIRATION BLADDER Height & Weight     Self, Place  Normal Continent Weight:   Height:     BEHAVIORAL SYMPTOMS/MOOD NEUROLOGICAL BOWEL NUTRITION STATUS      Continent Diet(regular diet, thin liquids)  AMBULATORY STATUS COMMUNICATION OF NEEDS Skin   Limited Assist Verbally Surgical wounds(Closed incision right hip, Hydrocolloid dressing)                       Personal Care Assistance Level of Assistance  Bathing, Feeding, Dressing Bathing Assistance: Limited assistance Feeding assistance:  Independent Dressing Assistance: Limited assistance     Functional Limitations Info  Hearing, Speech, Sight Sight Info: Adequate Hearing Info: Adequate Speech Info: Adequate    SPECIAL CARE FACTORS FREQUENCY  PT (By licensed PT), OT (By licensed OT)     PT Frequency: 7x OT Frequency: 7x            Contractures Contractures Info: Not present    Additional Factors Info  Code Status, Allergies Code Status Info: Full Code Allergies Info: Gabapentin, Ciprofloxacin, Novocain Procaine Hcl, Prednisone           Current Medications (03/09/2018):  This is the current hospital active medication list Current Facility-Administered Medications  Medication Dose Route Frequency Provider Last Rate Last Dose  . 0.9 %  sodium chloride infusion   Intravenous Continuous Lanae Crumbly, PA-C 85 mL/hr at 03/07/18 1842    . aspirin EC tablet 325 mg  325 mg Oral Q breakfast Lanae Crumbly, PA-C   325 mg at 03/09/18 9030  . cholecalciferol (VITAMIN D) tablet 5,000 Units  5,000 Units Oral Daily [name], MD   5,000 Units at 03/09/18 [phone]  . citalopram (CELEXA) tablet 20 mg  20 mg Oral Daily Lanae Crumbly, PA-C   20 mg at 03/09/18 3007  . diltiazem (CARDIZEM) tablet 60 mg  60 mg Oral BID Lanae Crumbly, PA-C   60 mg at 03/09/18 6226  . docusate sodium (COLACE) capsule 100 mg  100 mg Oral BID Lanae Crumbly,  PA-C   100 mg at 03/09/18 0953  . finasteride (PROSCAR) tablet 5 mg  5 mg Oral Daily Lanae Crumbly, PA-C   5 mg at 03/09/18 0370  . HYDROcodone-acetaminophen (NORCO/VICODIN) 5-325 MG per tablet 1 tablet  1 tablet Oral Q6H PRN Lanae Crumbly, PA-C   1 tablet at 03/09/18 0955  . LORazepam (ATIVAN) tablet 1 mg  1 mg Oral QHS PRN Lanae Crumbly, PA-C   1 mg at 03/08/18 0100  . magnesium oxide (MAG-OX) tablet 200 mg  200 mg Oral Daily [name], MD   200 mg at 03/09/18 0953  . menthol-cetylpyridinium (CEPACOL) lozenge 3 mg  1 lozenge Oral PRN Lanae Crumbly, PA-C       Or  . phenol  (CHLORASEPTIC) mouth spray 1 spray  1 spray Mouth/Throat PRN Lanae Crumbly, PA-C      . metoCLOPramide (REGLAN) tablet 5-10 mg  5-10 mg Oral Q8H PRN Lanae Crumbly, PA-C       Or  . metoCLOPramide (REGLAN) injection 5-10 mg  5-10 mg Intravenous Q8H PRN Benjiman Core M, PA-C      . ondansetron [facility]) tablet 4 mg  4 mg Oral Q6H PRN Lanae Crumbly, PA-C       Or  . ondansetron [facility]) injection 4 mg  4 mg Intravenous Q6H PRN Benjiman Core M, PA-C      . polyethylene glycol (MIRALAX / GLYCOLAX) packet 17 g  17 g Oral Daily PRN Lanae Crumbly, PA-C   17 g at 03/08/18 4888     Discharge Medications: Please see discharge summary for a list of discharge medications.  Relevant Imaging Results:  Relevant Lab Results:   Additional Information SSN: [id number]  Eileen Stanford, LCSW

## 2018-03-10 ENCOUNTER — Encounter (HOSPITAL_COMMUNITY): Payer: Self-pay | Admitting: Orthopaedic Surgery

## 2018-03-10 LAB — CBC
HEMATOCRIT: 30.6 % — AB (ref 39.0–52.0)
Hemoglobin: 9.9 g/dL — ABNORMAL LOW (ref 13.0–17.0)
MCH: 30.9 pg (ref 26.0–34.0)
MCHC: 32.4 g/dL (ref 30.0–36.0)
MCV: 95.6 fL (ref 78.0–100.0)
Platelets: 239 10*3/uL (ref 150–400)
RBC: 3.2 MIL/uL — AB (ref 4.22–5.81)
RDW: 13.2 % (ref 11.5–15.5)
WBC: 12.9 10*3/uL — AB (ref 4.0–10.5)

## 2018-03-10 MED ORDER — TAMSULOSIN HCL 0.4 MG PO CAPS
0.4000 mg | ORAL_CAPSULE | Freq: Every day | ORAL | Status: DC
  Administered 2018-03-10 – 2018-03-11 (×2): 0.4 mg via ORAL
  Filled 2018-03-10 (×2): qty 1

## 2018-03-10 NOTE — Progress Notes (Signed)
03/10/2018 Pt was seen for a second session this PM as he has turned a corner and is progressing faster than his first few sessions.  He was fatigued this PM, however, and did not walk as far, still only requiring min assist overall.  He remains appropriate for SNF level rehab at discharge and wife/pt are interested in [facility].  PT will continue to follow acutely to help progress safe mobility and exercises.  Barbarann Ehlers Ashaki Frosch, PT, [place] #[phone]      03/10/18 1631  PT Visit Information  Last PT Received On 03/10/18  Assistance Needed +2 (for chair to follow- wife will assist if present)  History of Present Illness 84 y.o. male admitted for elective R direct anterior THA.  Pt with significant PMH ofpre diabetes, HTN, dysrhythmia, and Afib/flutter.  Subjective Data  Patient Stated Goal to decrease the pain  Precautions  Precautions Fall  Restrictions  RLE Weight Bearing WBAT  Pain Assessment  Pain Assessment Faces  Faces Pain Scale 8  Pain Location right hip  Pain Descriptors / Indicators Grimacing;Guarding  Pain Intervention(s) Limited activity within patient's tolerance;Monitored during session;Repositioned;RN gave pain meds during session;Patient requesting pain meds-RN notified;Ice applied  Cognition  Arousal/Alertness Awake/alert  Behavior During Therapy WFL for tasks assessed/performed  Overall Cognitive Status Impaired/Different from baseline  Area of Impairment Memory  Memory Decreased short-term memory  Bed Mobility  Overal bed mobility Needs Assistance  Bed Mobility Supine to Sit  Supine to sit Min assist;HOB elevated  General bed mobility comments Min assist to help progress his right leg to EOB and support trunk/provide a hand to pull up against to get to sitting.   Transfers  Overall transfer level Needs assistance  Equipment used Rolling walker (2 wheeled)  Transfers Sit to/from Stand  Sit to Stand Min assist;From elevated surface  General transfer comment  Min assit to power up to stand from elevated bed.  Verbal cues for safe hand placement.   Ambulation/Gait  Ambulation/Gait assistance Min assist;+2 safety/equipment  Ambulation Distance (Feet) 40 Feet  Assistive device Rolling walker (2 wheeled)  Gait Pattern/deviations Step-through pattern;Antalgic;Trunk flexed  General Gait Details Pt is more fatigued and sore this PM than during his AM session.  he was not able to walk as far even with the chair to follow.  Min assist for balance and to help control the RW as he likes to push it too far ahead.   Balance  Overall balance assessment Needs assistance  Sitting-balance support Feet supported;No upper extremity supported  Sitting balance-Leahy Scale Fair  Standing balance support No upper extremity supported;Bilateral upper extremity supported;Single extremity supported  Standing balance-Leahy Scale Poor  Exercises  Exercises Total Joint  Total Joint Exercises  Ankle Circles/Pumps AROM;Both;20 reps  Quad Sets AROM;Right;10 reps  Heel Slides AAROM;Right;10 reps  Hip ABduction/ADduction AAROM;Right;10 reps  Short Arc Quad AROM;Right;10 reps  Long Arc Quad AROM;Right;10 reps  PT - End of Session  Activity Tolerance Patient limited by pain;Patient limited by fatigue  Patient left in bed;with call bell/phone within reach;with family/visitor present   PT - Assessment/Plan  PT Plan Current plan remains appropriate  PT Visit Diagnosis Muscle weakness (generalized) (M62.81);Difficulty in walking, not elsewhere classified (R26.2);Pain  Pain - Right/Left Right  Pain - part of body Hip  PT Frequency (ACUTE ONLY) 7X/week  Follow Up Recommendations SNF;Follow surgeon's recommendation for DC plan and follow-up therapies;Supervision/Assistance - 24 hour  PT equipment Hospital bed  AM-PAC PT "6 Clicks" Daily Activity Outcome Measure  Difficulty  turning over in bed (including adjusting bedclothes, sheets and blankets)? 1  Difficulty moving from lying  on back to sitting on the side of the bed?  1  Difficulty sitting down on and standing up from a chair with arms (e.g., wheelchair, bedside commode, etc,.)? 1  Help needed moving to and from a bed to chair (including a wheelchair)? 3  Help needed walking in hospital room? 3  Help needed climbing 3-5 steps with a railing?  2  6 Click Score 11  Mobility G Code  CL  PT Goal Progression  Progress towards PT goals Progressing toward goals  PT Time Calculation  PT Start Time (ACUTE ONLY) 1500  PT Stop Time (ACUTE ONLY) 1537  PT Time Calculation (min) (ACUTE ONLY) 37 min  PT General Charges  $$ ACUTE PT VISIT 1 Visit  PT Treatments  $Gait Training 8-22 mins  $Therapeutic Exercise 8-22 mins

## 2018-03-10 NOTE — Progress Notes (Signed)
Foley catheter removed per order. 1500cc documented. Patient tolerated well. Assisted to chair, pt ambulated well.

## 2018-03-10 NOTE — Progress Notes (Signed)
Physical Therapy Treatment Patient Details Name: [name] Yu MRN: [id number] DOB: 11/06/1926 Today's Date: 03/10/2018    History of Present Illness 84 y.o. male admitted for elective R direct anterior THA.  Pt with significant PMH ofpre diabetes, HTN, dysrhythmia, and Afib/flutter.    PT Comments    Pt is progressing nicely, able to walk down the hallway with second person following with the chair to encourage safety and increased gait distance.  He is still min assist overall and continues to benefit from post acute rehab to decrease his fall risk to return safely home with his wife.     Follow Up Recommendations  SNF;Follow surgeon's recommendation for DC plan and follow-up therapies;Supervision/Assistance - 24 hour     Equipment Recommendations  Hospital bed    Recommendations for Other Services   NA     Precautions / Restrictions Precautions Precautions: Fall Restrictions RLE Weight Bearing: Weight bearing as tolerated    Mobility  Bed Mobility               General bed mobility comments: Pt was OOB in the recliner chair.   Transfers Overall transfer level: Needs assistance Equipment used: Rolling walker (2 wheeled) Transfers: Sit to/from Stand Sit to Stand: Min assist;From elevated surface         General transfer comment: Min assist to help power up from low recliner chair.  Verbal cues for safe hand placement and slow descent to sit.   Ambulation/Gait Ambulation/Gait assistance: Min assist;+2 safety/equipment Ambulation Distance (Feet): 65 Feet Assistive device: Rolling walker (2 wheeled) Gait Pattern/deviations: Step-to pattern;Antalgic;Trunk flexed     General Gait Details: Pt needed min assist for balance and to help manually keep RW closer to him during gait.  Chair to follow for safety and to encourage increased gait distance.            Balance Overall balance assessment: Needs assistance Sitting-balance support: Feet  supported;Bilateral upper extremity supported Sitting balance-Leahy Scale: Fair     Standing balance support: Bilateral upper extremity supported Standing balance-Leahy Scale: Poor                              Cognition Arousal/Alertness: Awake/alert Behavior During Therapy: WFL for tasks assessed/performed Overall Cognitive Status: Impaired/Different from baseline Area of Impairment: Memory                     Memory: Decreased short-term memory                Exercises Total Joint Exercises Ankle Circles/Pumps: AROM;Both;20 reps Quad Sets: AROM;Right;10 reps Short Arc Quad: AROM;Right;10 reps Heel Slides: AAROM;Right;10 reps Hip ABduction/ADduction: AAROM;Right;10 reps Long Arc Quad: AROM;Right;10 reps        Pertinent Vitals/Pain Pain Assessment: Faces Faces Pain Scale: Hurts even more Pain Location: right hip Pain Descriptors / Indicators: Grimacing;Guarding Pain Intervention(s): Limited activity within patient's tolerance;Monitored during session;Repositioned;Premedicated before session;Ice applied           PT Goals (current goals can now be found in the care plan section) Acute Rehab PT Goals Patient Stated Goal: to decrease the pain Progress towards PT goals: Progressing toward goals    Frequency    7X/week      PT Plan Current plan remains appropriate       AM-PAC PT "6 Clicks" Daily Activity  Outcome Measure  Difficulty turning over in bed (including adjusting bedclothes, sheets and blankets)?: Unable Difficulty  moving from lying on back to sitting on the side of the bed? : Unable Difficulty sitting down on and standing up from a chair with arms (e.g., wheelchair, bedside commode, etc,.)?: Unable Help needed moving to and from a bed to chair (including a wheelchair)?: A Little Help needed walking in hospital room?: A Little Help needed climbing 3-5 steps with a railing? : A Lot 6 Click Score: 11    End of Session    Activity Tolerance: Patient limited by fatigue;Patient limited by pain Patient left: in chair;with call bell/phone within reach;with family/visitor present   PT Visit Diagnosis: Muscle weakness (generalized) (M62.81);Difficulty in walking, not elsewhere classified (R26.2);Pain Pain - Right/Left: Right Pain - part of body: Hip     Time: 1010-1026 PT Time Calculation (min) (ACUTE ONLY): 16 min  Charges:  $Gait Training: 8-22 mins          Taneisha Fuson B. [place], [place], DPT [phone]            03/10/2018, 4:29 PM

## 2018-03-10 NOTE — Social Work (Signed)
CSW called [facility] to see if they can make a bed offers. CSW spoke with Rolla Plate in admission who will review and get back to CSW.  Pt has [employer] and desired SNF will need to obtain [facility] for placement.  CSW f/u for disposition.  Elissa Hefty, LCSW Clinical Social Worker [phone]

## 2018-03-10 NOTE — Progress Notes (Signed)
Patient ID: [name] Yu, male   DOB: 04-02-1927, 84 y.o.   MRN: [id number] Slow progress with therapy.  Skilled nursing has been recommended.  Foley is still in - will have it removed today and also try Flomax.  Social Work has been consulted.  Likely discharge tomorrow.

## 2018-03-10 NOTE — Care Management Important Message (Signed)
Important Message  Patient Details  Name: [name] Yu MRN: [id number] Date of Birth: 05/17/1927   Medicare Important Message Given:  Yes    Taelar Gronewold P Devri Kreher 03/10/2018, 12:49 PM

## 2018-03-11 DIAGNOSIS — R338 Other retention of urine: Secondary | ICD-10-CM

## 2018-03-11 DIAGNOSIS — N9989 Other postprocedural complications and disorders of genitourinary system: Secondary | ICD-10-CM

## 2018-03-11 MED ORDER — HYDROCODONE-ACETAMINOPHEN 5-325 MG PO TABS: 1.0000 | ORAL_TABLET | Freq: Four times a day (QID) | ORAL | 0 refills | Status: DC | PRN

## 2018-03-11 MED ORDER — ASPIRIN 325 MG PO TBEC: 325.0000 mg | DELAYED_RELEASE_TABLET | Freq: Every day | ORAL | 0 refills | Status: DC

## 2018-03-11 MED ORDER — TAMSULOSIN HCL 0.4 MG PO CAPS: 0.4000 mg | ORAL_CAPSULE | Freq: Every day | ORAL | 0 refills | Status: DC

## 2018-03-11 NOTE — Progress Notes (Signed)
AVS given and reviewed with pt and pt's wife. Printed prescriptions provided. Pt's wife said they had BSC and walker at home. All questions answered to satisfaction. Pt to be escorted off the unit via wheelchair by volunteer services.

## 2018-03-11 NOTE — Care Management Note (Signed)
Case Management Note  Patient Details  Name: [name] Yu MRN: [id number] Date of Birth: [birth year]  Subjective/Objective:  84 yr old gentleman s/p right total hip arthroplasty.                  Action/Plan: Case manager previously spoke with patient and wife concerning discharge plan. They have necessary DME at home. Per Dr. [name], patient will continue to mobilize and will not need Home Health therapy.    Expected Discharge Date:  03/11/18               Expected Discharge Plan:  Home/Self Care  In-House Referral:  NA  Discharge planning Services  CM Consult  Post Acute Care Choice:  NA Choice offered to:     DME Arranged:  N/A(Has RW and 3in1) DME Agency:  NA  HH Arranged:  NA HH Agency:  NA  Status of Service:  Completed, signed off  If discussed at [place] of Stay Meetings, dates discussed:    Additional Comments:  Ninfa Meeker, RN 03/11/2018, 10:42 AM

## 2018-03-11 NOTE — Progress Notes (Addendum)
   Subjective: 4 Days Post-Op Procedure(s) (LRB): RIGHT TOTAL HIP ARTHROPLASTY DIRECT ANTERIOR (Right) Patient reports pain as mild.  Doing better. Had BM times 2. Walked in halls. SNF vs Home and wife and pt want to go home. Voiding on his own now.   Objective: Vital signs in last 24 hours: Temp:  [98.2 F (36.8 C)-98.6 F (37 C)] 98.4 F (36.9 C) (05/28 0440) Pulse Rate:  [79-126] 79 (05/28 0440) Resp:  [15-16] 16 (05/28 0440) BP: (102-113)/(61-77) 112/67 (05/28 0440) SpO2:  [98 %-99 %] 99 % (05/28 0440)  Intake/Output from previous day: 05/27 0701 - 05/28 0700 In: 836 [P.O.:836] Out: 1500 [Urine:1500] Intake/Output this shift: No intake/output data recorded.  Recent Labs    03/09/18 0525 03/10/18 0410  HGB 10.4* 9.9*   Recent Labs    03/09/18 0525 03/10/18 0410  WBC 15.6* 12.9*  RBC 3.37* 3.20*  HCT 32.6* 30.6*  PLT 220 239   No results for input(s): NA, K, CL, CO2, BUN, CREATININE, GLUCOSE, CALCIUM in the last 72 hours. No results for input(s): LABPT, INR in the last 72 hours.  Neurologically intact No results found.  Assessment/Plan: 4 Days Post-Op Procedure(s) (LRB): RIGHT TOTAL HIP ARTHROPLASTY DIRECT ANTERIOR (Right) with post op urinary retention now resolved.  Plan:  Discharge home. Office one week.   [name] 03/11/2018, 7:19 AM

## 2018-03-11 NOTE — Discharge Instructions (Signed)
Walk daily. See Dr. [name] in one week. OK to shower. Use walker. Gradually increase daily walking distance.

## 2018-03-11 NOTE — Progress Notes (Signed)
Physical Therapy Treatment Patient Details Name: [name] Yu MRN: [id number] DOB: 1927/03/08 Today's Date: 03/11/2018    History of Present Illness 84 y.o. male admitted for elective R direct anterior THA.  Pt with significant PMH ofpre diabetes, HTN, dysrhythmia, and Afib/flutter.    PT Comments    Pt continues to progress well but continues to require min guard assistance for gait and stair training.  Pt reports he has necessary equipment at home for d/c.  Plan per MD to return home without HHPT.  Continue to recommend short term SNF placement per PT recs based on safety and balance deficits.  Reviewed stair training and supine/seated HEP with patient and spouse.  Deferred standing exercises at this time due to balance.  Informed nursing PT session was complete.      Follow Up Recommendations  SNF;Follow surgeon's recommendation for DC plan and follow-up therapies;Supervision/Assistance - 24 hour     Equipment Recommendations  Hospital bed    Recommendations for Other Services       Precautions / Restrictions Precautions Precautions: Fall Restrictions Weight Bearing Restrictions: Yes RLE Weight Bearing: Weight bearing as tolerated    Mobility  Bed Mobility               General bed mobility comments: Pt in recliner on arrival.    Transfers Overall transfer level: Needs assistance Equipment used: Rolling walker (2 wheeled) Transfers: Sit to/from Stand Sit to Stand: Supervision         General transfer comment: Cues for hand placement but no assistance needed to achieve standing.    Ambulation/Gait Ambulation/Gait assistance: +2 safety/equipment;Min guard(for chair follow) Ambulation Distance (Feet): 80 Feet Assistive device: Rolling walker (2 wheeled) Gait Pattern/deviations: Step-through pattern;Antalgic;Trunk flexed     General Gait Details: Cues for closer proxmity to RW, cues for upper trunk control and forward gaze.     Stairs Stairs:  Yes Stairs assistance: Min guard Stair Management: One rail Right;Step to pattern;Forwards Number of Stairs: 2 General stair comments: Cues for sequencing and hand placement on R hand railing.  Pt required cues for safety.  Educated wife on placing RW at the top or bottom so it's waiting for patient.  Educated how to use gait belt and to guard patient on the downward slope.     Wheelchair Mobility    Modified Rankin (Stroke Patients Only)       Balance Overall balance assessment: Needs assistance Sitting-balance support: Feet supported;No upper extremity supported Sitting balance-Leahy Scale: Good Sitting balance - Comments: supervision EOB with bil hand prop.     Standing balance-Leahy Scale: Fair                              Cognition Arousal/Alertness: Awake/alert Behavior During Therapy: WFL for tasks assessed/performed Overall Cognitive Status: Impaired/Different from baseline Area of Impairment: Memory                     Memory: Decreased short-term memory                Exercises Total Joint Exercises Ankle Circles/Pumps: AROM;Both;20 reps;Supine Quad Sets: AROM;10 reps;Both;Supine Short Arc Quad: AROM;Right;10 reps;Supine Heel Slides: AAROM;Right;10 reps;Supine Hip ABduction/ADduction: AAROM;Right;10 reps;Supine Long Arc Quad: AROM;Right;10 reps;Seated    General Comments        Pertinent Vitals/Pain Pain Assessment: Faces Pain Score: 4  Pain Location: right hip Pain Descriptors / Indicators: Grimacing;Guarding Pain Intervention(s): Monitored during session;Repositioned;Ice applied  Home Living                      Prior Function            PT Goals (current goals can now be found in the care plan section) Acute Rehab PT Goals Patient Stated Goal: to decrease the pain Potential to Achieve Goals: Fair Progress towards PT goals: Progressing toward goals    Frequency    7X/week      PT Plan Current  plan remains appropriate    Co-evaluation              AM-PAC PT "6 Clicks" Daily Activity  Outcome Measure  Difficulty turning over in bed (including adjusting bedclothes, sheets and blankets)?: Unable Difficulty moving from lying on back to sitting on the side of the bed? : Unable Difficulty sitting down on and standing up from a chair with arms (e.g., wheelchair, bedside commode, etc,.)?: Unable Help needed moving to and from a bed to chair (including a wheelchair)?: A Little Help needed walking in hospital room?: A Little Help needed climbing 3-5 steps with a railing? : A Lot 6 Click Score: 11    End of Session Equipment Utilized During Treatment: Gait belt Activity Tolerance: Patient limited by pain;Patient limited by fatigue Patient left: with call bell/phone within reach;with family/visitor present;in chair Nurse Communication: Mobility status(informed nursing PT tx is complete and patient is ready for d/c.  ) PT Visit Diagnosis: Muscle weakness (generalized) (M62.81);Difficulty in walking, not elsewhere classified (R26.2);Pain Pain - Right/Left: Right Pain - part of body: Hip     Time: 9892-1194 PT Time Calculation (min) (ACUTE ONLY): 24 min  Charges:  $Gait Training: 8-22 mins $Therapeutic Exercise: 8-22 mins                    G Codes:       [name] Yu, PTA pager [phone]    [name] Yu 03/11/2018, 10:45 AM

## 2018-03-14 ENCOUNTER — Ambulatory Visit (HOSPITAL_COMMUNITY)
Admission: RE | Admit: 2018-03-14 | Discharge: 2018-03-14 | Disposition: A | Discharge status: Home / Self Care | Payer: Medicare HMO | Source: Ambulatory Visit | Attending: Orthopaedic Surgery | Admitting: Orthopaedic Surgery

## 2018-03-14 ENCOUNTER — Other Ambulatory Visit (INDEPENDENT_AMBULATORY_CARE_PROVIDER_SITE_OTHER): Payer: Self-pay

## 2018-03-14 ENCOUNTER — Encounter (INDEPENDENT_AMBULATORY_CARE_PROVIDER_SITE_OTHER): Payer: Self-pay | Admitting: Orthopaedic Surgery

## 2018-03-14 ENCOUNTER — Telehealth (INDEPENDENT_AMBULATORY_CARE_PROVIDER_SITE_OTHER): Payer: Self-pay | Admitting: Radiology

## 2018-03-14 ENCOUNTER — Ambulatory Visit (INDEPENDENT_AMBULATORY_CARE_PROVIDER_SITE_OTHER): Payer: Medicare HMO | Admitting: Orthopaedic Surgery

## 2018-03-14 VITALS — BP 154/69 | HR 88 | Ht 70.0 in | Wt 194.0 lb

## 2018-03-14 DIAGNOSIS — Z96641 Presence of right artificial hip joint: Secondary | ICD-10-CM

## 2018-03-14 DIAGNOSIS — M7989 Other specified soft tissue disorders: Secondary | ICD-10-CM

## 2018-03-14 NOTE — Progress Notes (Signed)
Preliminary results by tech - Right Lower Ext. Venous Duplex Completed. Negative for deep vein thrombosis. Results given to [facility]. Oda Cogan, BS, RDMS, RVT

## 2018-03-14 NOTE — Telephone Encounter (Signed)
Patient's doppler was negative for DVT. Patient coming back to office to be seen.

## 2018-03-14 NOTE — Discharge Summary (Signed)
Patient ID: [name] Yu MRN: [id number] DOB/AGE: 82-May-1928 82 y.o.  Admit date: 03/07/2018 Discharge date: 03/14/2018  Admission Diagnoses:  Active Problems:   Unilateral primary osteoarthritis, right hip   Arthritis of right hip   Postoperative urinary retention   Discharge Diagnoses:  Active Problems:   Unilateral primary osteoarthritis, right hip   Arthritis of right hip   Postoperative urinary retention  status post Procedure(s): RIGHT TOTAL HIP ARTHROPLASTY DIRECT ANTERIOR  Past Medical History:  Diagnosis Date  . Arthritis   . ASHD (arteriosclerotic heart disease)   . Atrial fib/flutter, transient    off coumadin since 11/2013- CHADSVAC of 2  . Cancer (HCC)    BASAL CELL   . Colon polyps    2003 hyperplastic and tubuler adenoma  . Depression   . Diverticulosis   . Dyslipidemia   . Dysrhythmia   . History of kidney stones   . Hypertension   . OSA (obstructive sleep apnea)    NO CPAP IN 10+ YEARS  . Pre-diabetes   . Rectus sheath hematoma 11/21/2013   Dr. Deatra Ina     Surgeries: Procedure(s): RIGHT TOTAL HIP ARTHROPLASTY DIRECT ANTERIOR on 03/07/2018   Consultants:   Discharged Condition: Improved  Hospital Course: [name] Yu is an 84 y.o. male who was admitted 03/07/2018 for operative treatment of hip arthritis. Patient failed conservative treatments (please see the history and physical for the specifics) and had severe unremitting pain that affects sleep, daily activities and work/hobbies. After pre-op clearance, the patient was taken to the operating room on 03/07/2018 and underwent  Procedure(s): RIGHT TOTAL HIP ARTHROPLASTY DIRECT ANTERIOR.    Patient was given perioperative antibiotics:  Anti-infectives (From admission, onward)   Start     Dose/Rate Route Frequency Ordered Stop   03/07/18 1700  ceFAZolin (ANCEF) IVPB 1 g/50 mL premix     1 g 100 mL/hr over 30 Minutes Intravenous Every 8 hours 03/07/18 1652 03/07/18 2217   03/07/18 0845   ceFAZolin (ANCEF) IVPB 2g/100 mL premix     2 g 200 mL/hr over 30 Minutes Intravenous On call to O.R. 03/07/18 0836 03/07/18 1057   03/07/18 0837  ceFAZolin (ANCEF) 2-4 GM/100ML-% IVPB    Note to Pharmacy:  Laurita Quint   : cabinet override      03/07/18 0837 03/07/18 1057       Patient was given sequential compression devices and early ambulation to prevent DVT.   Patient benefited maximally from hospital stay and there were no complications. At the time of discharge, the patient was urinating/moving their bowels without difficulty, tolerating a regular diet, pain is controlled with oral pain medications and they have been cleared by PT/OT.   Recent vital signs: No data found.   Recent laboratory studies: No results for input(s): WBC, HGB, HCT, PLT, NA, K, CL, CO2, BUN, CREATININE, GLUCOSE, INR, CALCIUM in the last 72 hours.  Invalid input(s): PT, 2   Discharge Medications:   Allergies as of 03/11/2018      Reactions   Gabapentin    Confusion   Ciprofloxacin    dysphoria   Novocain [procaine Hcl]    Prednisone    Nervousness      Medication List    TAKE these medications   aspirin 325 MG EC tablet Take 1 tablet (325 mg total) by mouth daily with breakfast. What changed:    medication strength  how much to take  when to take this   Cholecalciferol 5000 units Tabs Take  1 tablet by mouth daily.   citalopram 40 MG tablet Commonly known as:  CELEXA Take 1 tablet (40 mg total) by mouth daily. What changed:  how much to take   diltiazem 60 MG tablet Commonly known as:  CARDIZEM Take 1 tablet (60 mg total) by mouth 2 (two) times daily.   finasteride 5 MG tablet Commonly known as:  PROSCAR TAKE 1 TABLET BY MOUTH EVERY DAY   FISH OIL PO Take 1 capsule by mouth daily.   HYDROcodone-acetaminophen 5-325 MG tablet Commonly known as:  NORCO/VICODIN Take 1 tablet by mouth every 6 (six) hours as needed for moderate pain (pain score 4-6). What changed:    how much  to take  how to take this  when to take this  reasons to take this  additional instructions   LORazepam 2 MG tablet Commonly known as:  ATIVAN Take 1/2 to 1 tablet ONLY if needed at hour of sleep   Magnesium 200 MG Tabs Take 1 tablet by mouth daily.   multivitamin capsule Take 1 capsule by mouth daily.   tamsulosin 0.4 MG Caps capsule Commonly known as:  FLOMAX Take 1 capsule (0.4 mg total) by mouth daily after breakfast.   TUMS E-X PO Take 2 tablets by mouth daily.   vitamin C 500 MG tablet Commonly known as:  ASCORBIC ACID Take 1,000 mg by mouth daily.       Diagnostic Studies: Dg C-arm 1-60 Min  Result Date: 03/07/2018 CLINICAL DATA:  Right hip replacement EXAM: OPERATIVE RIGHT HIP WITH PELVIS; DG C-ARM 61-120 MIN COMPARISON:  None. FLUOROSCOPY TIME:  Radiation Exposure Index (as provided by the fluoroscopic device): Not available If the device does not provide the exposure index: Fluoroscopy Time:  30 seconds Number of Acquired Images:  2 FINDINGS: Two spot films obtained reveal a right hip prosthesis in satisfactory position. No acute bony or soft tissue abnormality is noted. IMPRESSION: Right hip replacement Electronically Signed   By: Inez Catalina M.D.   On: 03/07/2018 13:20   Dg C-arm 1-60 Min  Result Date: 03/07/2018 CLINICAL DATA:  Right hip replacement EXAM: OPERATIVE RIGHT HIP WITH PELVIS; DG C-ARM 61-120 MIN COMPARISON:  None. FLUOROSCOPY TIME:  Radiation Exposure Index (as provided by the fluoroscopic device): Not available If the device does not provide the exposure index: Fluoroscopy Time:  30 seconds Number of Acquired Images:  2 FINDINGS: Two spot films obtained reveal a right hip prosthesis in satisfactory position. No acute bony or soft tissue abnormality is noted. IMPRESSION: Right hip replacement Electronically Signed   By: Inez Catalina M.D.   On: 03/07/2018 13:20   Dg Hip Port Unilat With Pelvis 1v Right  Result Date: 03/07/2018 CLINICAL DATA:   Postop RIGHT hip replacement EXAM: DG HIP (WITH OR WITHOUT PELVIS) 1V PORT RIGHT COMPARISON:  Intraoperative images 5 02/01/2018 FINDINGS: Osseous demineralization. RIGHT hip prosthesis without fracture dislocation. Degenerative changes of LEFT hip joint. Visualized pelvis unremarkable. IMPRESSION: RIGHT hip prosthesis without acute complication. Osseous demineralization with degenerative changes LEFT hip joint. Electronically Signed   By: Lavonia Dana M.D.   On: 03/07/2018 15:12   Dg Hip Operative Unilat W Or W/o Pelvis Right  Result Date: 03/07/2018 CLINICAL DATA:  Right hip replacement EXAM: OPERATIVE RIGHT HIP WITH PELVIS; DG C-ARM 61-120 MIN COMPARISON:  None. FLUOROSCOPY TIME:  Radiation Exposure Index (as provided by the fluoroscopic device): Not available If the device does not provide the exposure index: Fluoroscopy Time:  30 seconds Number of Acquired  Images:  2 FINDINGS: Two spot films obtained reveal a right hip prosthesis in satisfactory position. No acute bony or soft tissue abnormality is noted. IMPRESSION: Right hip replacement Electronically Signed   By: Inez Catalina M.D.   On: 03/07/2018 13:20        Discharge Plan:  discharge to home  Disposition:     Signed: Benjiman Core  03/14/2018, 3:02 PM

## 2018-03-17 ENCOUNTER — Encounter (INDEPENDENT_AMBULATORY_CARE_PROVIDER_SITE_OTHER): Payer: Self-pay | Admitting: Orthopaedic Surgery

## 2018-03-17 DIAGNOSIS — Z96641 Presence of right artificial hip joint: Secondary | ICD-10-CM | POA: Insufficient documentation

## 2018-03-17 HISTORY — DX: Presence of right artificial hip joint: Z96.641

## 2018-03-17 NOTE — Progress Notes (Signed)
   Post-Op Visit Note   Patient: [name] Yu           Date of Birth: 06-21-1927           MRN: [id number] Visit Date: 03/14/2018 PCP: [name], MD   Assessment & Plan: Post right total hip arthroplasty anterior approach with right lower extremity swelling with Doppler test done today negative for DVT.  He will continue walking continue using ice.  He has hydrocodone for pain is trying to use it sparingly.  Chief Complaint:  Chief Complaint  Patient presents with  . Right Hip - Follow-up   Visit Diagnoses: Post right total hip arthroplasty  Plan: Continue walking laydown elevate his legs for the swelling.  Incision looks good there is mild to moderate postop thigh swelling after total hip arthroplasty anterior approach.  Follow-Up Instructions: No follow-ups on file.   Orders:  No orders of the defined types were placed in this encounter.  No orders of the defined types were placed in this encounter.   Imaging: No results found.  PMFS History: Patient Active Problem List   Diagnosis Date Noted  . Postoperative urinary retention 03/11/2018  . BPH (benign prostatic hyperplasia) 02/26/2018  . Anxiety 10/16/2017  . CAD (coronary artery disease) 12/28/2015  . BMI 26.0-26.9,adult 08/21/2015  . Encounter for Medicare annual wellness exam 04/27/2015  . Vitamin D deficiency 03/04/2014  . Medication management 02/17/2014  . ASHD (arteriosclerotic heart disease)   . Depression, major, in remission ([place])   . Diverticulosis   . Personal history of colonic polyps 12/29/2012  . Hypertension   . Other abnormal glucose   . Mixed hyperlipidemia   . OSA (obstructive sleep apnea)   . Atrial fibrillation [facility])    Past Medical History:  Diagnosis Date  . Arthritis   . ASHD (arteriosclerotic heart disease)   . Atrial fib/flutter, transient    off coumadin since 11/2013- [facility] of 2  . Cancer (HCC)    BASAL CELL   . Colon polyps    2003 hyperplastic and tubuler  adenoma  . Depression   . Diverticulosis   . Dyslipidemia   . Dysrhythmia   . History of kidney stones   . Hypertension   . OSA (obstructive sleep apnea)    NO CPAP IN 10+ YEARS  . Pre-diabetes   . Rectus sheath hematoma 11/21/2013   Dr. Deatra Ina     Family History  Problem Relation Age of Onset  . Alzheimer's disease Mother   . Kidney cancer Father   . Diabetes Brother   . Breast cancer Sister   . Rectal cancer Sister   . Breast cancer Daughter   . Ulcerative colitis Sister   . Heart disease Brother   . Heart disease Sister     Past Surgical History:  Procedure Laterality Date  . HERNIA REPAIR    . PROSTATE    . TOTAL HIP ARTHROPLASTY Right 03/07/2018   Procedure: RIGHT TOTAL HIP ARTHROPLASTY DIRECT ANTERIOR;  Surgeon: [name], MD;  Location: [place];  Service: Orthopedics;  Laterality: Right;   Social History   Occupational History  . Occupation: Retired  Tobacco Use  . Smoking status: Former Smoker    Last attempt to quit: 09/14/1993    Years since quitting: 24.5  . Smokeless tobacco: Never Used  Substance and Sexual Activity  . Alcohol use: No  . Drug use: No  . Sexual activity: Not on file

## 2018-03-19 ENCOUNTER — Ambulatory Visit (INDEPENDENT_AMBULATORY_CARE_PROVIDER_SITE_OTHER): Payer: Medicare HMO

## 2018-03-19 ENCOUNTER — Encounter (INDEPENDENT_AMBULATORY_CARE_PROVIDER_SITE_OTHER): Payer: Self-pay | Admitting: Orthopaedic Surgery

## 2018-03-19 ENCOUNTER — Ambulatory Visit (INDEPENDENT_AMBULATORY_CARE_PROVIDER_SITE_OTHER): Payer: Medicare HMO | Admitting: Orthopaedic Surgery

## 2018-03-19 VITALS — BP 118/67 | HR 69 | Temp 97.2°F

## 2018-03-19 DIAGNOSIS — Z96641 Presence of right artificial hip joint: Secondary | ICD-10-CM | POA: Diagnosis not present

## 2018-03-19 NOTE — Progress Notes (Signed)
Post-Op Visit Note   Patient: [name] Yu           Date of Birth: 01-Jul-1927           MRN: [id number] Visit Date: 03/19/2018 PCP: [name], MD   Assessment & Plan: Post right total hip arthroplasty.  He is weaning pain medication he been on Norco for many months but at a low dose.  Staples removed incision looks good return 1 month no x-ray needed on return.  Chief Complaint:  Chief Complaint  Patient presents with  . Right Hip - Routine Post Op   Visit Diagnoses:  1. Status post total hip replacement, right     Plan: Return 1 month for recheck.  Continue ambulation.  Follow-Up Instructions: Return in about 1 month (around 04/18/2018).   Orders:  Orders Placed This Encounter  Procedures  . XR HIP UNILAT W OR W/O PELVIS 2-3 VIEWS RIGHT   No orders of the defined types were placed in this encounter.   Imaging: Xr Hip Unilat W Or W/o Pelvis 2-3 Views Right  Result Date: 03/19/2018 AP pelvis show both hips and frog-leg right hip shows well-positioned total hip arthroplasty.  He has opposite left hip osteoarthritis. Impression: Satisfactory right total hip arthroplasty.   PMFS History: Patient Active Problem List   Diagnosis Date Noted  . Status post total replacement of right hip 03/17/2018  . Postoperative urinary retention 03/11/2018  . BPH (benign prostatic hyperplasia) 02/26/2018  . Anxiety 10/16/2017  . CAD (coronary artery disease) 12/28/2015  . BMI 26.0-26.9,adult 08/21/2015  . Encounter for Medicare annual wellness exam 04/27/2015  . Vitamin D deficiency 03/04/2014  . Medication management 02/17/2014  . ASHD (arteriosclerotic heart disease)   . Depression, major, in remission ([place])   . Diverticulosis   . Personal history of colonic polyps 12/29/2012  . Hypertension   . Other abnormal glucose   . Mixed hyperlipidemia   . OSA (obstructive sleep apnea)   . Atrial fibrillation [facility])    Past Medical History:  Diagnosis Date  . Arthritis   .  ASHD (arteriosclerotic heart disease)   . Atrial fib/flutter, transient    off coumadin since 11/2013- CHADSVAC of 2  . Cancer (HCC)    BASAL CELL   . Colon polyps    2003 hyperplastic and tubuler adenoma  . Depression   . Diverticulosis   . Dyslipidemia   . Dysrhythmia   . History of kidney stones   . Hypertension   . OSA (obstructive sleep apnea)    NO CPAP IN 10+ YEARS  . Pre-diabetes   . Rectus sheath hematoma 11/21/2013   Dr. Deatra Ina     Family History  Problem Relation Age of Onset  . Alzheimer's disease Mother   . Kidney cancer Father   . Diabetes Brother   . Breast cancer Sister   . Rectal cancer Sister   . Breast cancer Daughter   . Ulcerative colitis Sister   . Heart disease Brother   . Heart disease Sister     Past Surgical History:  Procedure Laterality Date  . HERNIA REPAIR    . PROSTATE    . TOTAL HIP ARTHROPLASTY Right 03/07/2018   Procedure: RIGHT TOTAL HIP ARTHROPLASTY DIRECT ANTERIOR;  Surgeon: [name], MD;  Location: [place];  Service: Orthopedics;  Laterality: Right;   Social History   Occupational History  . Occupation: Retired  Tobacco Use  . Smoking status: Former Smoker    Last attempt  to quit: 09/14/1993    Years since quitting: 24.5  . Smokeless tobacco: Never Used  Substance and Sexual Activity  . Alcohol use: No  . Drug use: No  . Sexual activity: Not on file

## 2018-04-21 ENCOUNTER — Other Ambulatory Visit: Payer: Self-pay | Admitting: Internal Medicine

## 2018-04-21 DIAGNOSIS — F411 Generalized anxiety disorder: Secondary | ICD-10-CM

## 2018-04-22 ENCOUNTER — Encounter (INDEPENDENT_AMBULATORY_CARE_PROVIDER_SITE_OTHER): Payer: Self-pay | Admitting: Orthopaedic Surgery

## 2018-04-22 ENCOUNTER — Ambulatory Visit (INDEPENDENT_AMBULATORY_CARE_PROVIDER_SITE_OTHER): Payer: Medicare HMO | Admitting: Orthopaedic Surgery

## 2018-04-22 VITALS — BP 127/82 | HR 74 | Ht 70.0 in | Wt 194.0 lb

## 2018-04-22 DIAGNOSIS — Z96641 Presence of right artificial hip joint: Secondary | ICD-10-CM

## 2018-04-22 NOTE — Progress Notes (Signed)
Post-Op Visit Note   Patient: [name] Yu           Date of Birth: 05-Jul-1927           MRN: [id number] Visit Date: 04/22/2018 PCP: [name], MD   Assessment & Plan: Post right total hip arthroplasty 03/07/2018.  He is off his narcotic medication which he did been on for several months before the surgery.  He is walking without his cane.  Occasionally the incision feels a little bit warm and he occasionally has some mild swelling at the incision site and some swelling in his foot is gradually decreasing.  He is walking better and states his preop pain is gone.  Patient does have some moderate narrowing at L3-4 and also severe foraminal narrowing L5-S1.  His ambulation is improved with hip arthroplasty and I will check him back again if he gets increased back symptoms.  Patient is happy with the surgical result.  Chief Complaint:  Chief Complaint  Patient presents with  . Right Hip - Follow-up, Routine Post Op   Visit Diagnoses:  1. Status post total replacement of right hip     Plan: Patient can return if he has increased back problems.  He is happy with the results of the total hip arthroplasty and is now walking without a cane and is off pain medicine.  Return PRN.  Follow-Up Instructions: No follow-ups on file.   Orders:  No orders of the defined types were placed in this encounter.  No orders of the defined types were placed in this encounter.   Imaging: No results found.  PMFS History: Patient Active Problem List   Diagnosis Date Noted  . Status post total replacement of right hip 03/17/2018  . Postoperative urinary retention 03/11/2018  . BPH (benign prostatic hyperplasia) 02/26/2018  . Anxiety 10/16/2017  . CAD (coronary artery disease) 12/28/2015  . BMI 26.0-26.9,adult 08/21/2015  . Encounter for Medicare annual wellness exam 04/27/2015  . Vitamin D deficiency 03/04/2014  . Medication management 02/17/2014  . ASHD (arteriosclerotic heart disease)   .  Depression, major, in remission ([place])   . Diverticulosis   . Personal history of colonic polyps 12/29/2012  . Hypertension   . Other abnormal glucose   . Mixed hyperlipidemia   . OSA (obstructive sleep apnea)   . Atrial fibrillation [facility])    Past Medical History:  Diagnosis Date  . Arthritis   . ASHD (arteriosclerotic heart disease)   . Atrial fib/flutter, transient    off coumadin since 11/2013- CHADSVAC of 2  . Cancer (HCC)    BASAL CELL   . Colon polyps    2003 hyperplastic and tubuler adenoma  . Depression   . Diverticulosis   . Dyslipidemia   . Dysrhythmia   . History of kidney stones   . Hypertension   . OSA (obstructive sleep apnea)    NO CPAP IN 10+ YEARS  . Pre-diabetes   . Rectus sheath hematoma 11/21/2013   Dr. Deatra Ina     Family History  Problem Relation Age of Onset  . Alzheimer's disease Mother   . Kidney cancer Father   . Diabetes Brother   . Breast cancer Sister   . Rectal cancer Sister   . Breast cancer Daughter   . Ulcerative colitis Sister   . Heart disease Brother   . Heart disease Sister     Past Surgical History:  Procedure Laterality Date  . HERNIA REPAIR    . PROSTATE    .  TOTAL HIP ARTHROPLASTY Right 03/07/2018   Procedure: RIGHT TOTAL HIP ARTHROPLASTY DIRECT ANTERIOR;  Surgeon: [name], MD;  Location: [place];  Service: Orthopedics;  Laterality: Right;   Social History   Occupational History  . Occupation: Retired  Tobacco Use  . Smoking status: Former Smoker    Last attempt to quit: 09/14/1993    Years since quitting: 24.6  . Smokeless tobacco: Never Used  Substance and Sexual Activity  . Alcohol use: No  . Drug use: No  . Sexual activity: Not on file

## 2018-05-15 ENCOUNTER — Ambulatory Visit: Payer: Self-pay | Admitting: Internal Medicine

## 2018-06-06 ENCOUNTER — Ambulatory Visit (INDEPENDENT_AMBULATORY_CARE_PROVIDER_SITE_OTHER): Payer: Medicare HMO | Admitting: Internal Medicine

## 2018-06-06 VITALS — BP 116/64 | HR 60 | Temp 97.7°F | Resp 16 | Ht 70.0 in | Wt 193.0 lb

## 2018-06-06 DIAGNOSIS — E559 Vitamin D deficiency, unspecified: Secondary | ICD-10-CM

## 2018-06-06 DIAGNOSIS — R7303 Prediabetes: Secondary | ICD-10-CM

## 2018-06-06 DIAGNOSIS — I2510 Atherosclerotic heart disease of native coronary artery without angina pectoris: Secondary | ICD-10-CM

## 2018-06-06 DIAGNOSIS — Z0001 Encounter for general adult medical examination with abnormal findings: Secondary | ICD-10-CM | POA: Diagnosis not present

## 2018-06-06 DIAGNOSIS — R6889 Other general symptoms and signs: Secondary | ICD-10-CM | POA: Diagnosis not present

## 2018-06-06 DIAGNOSIS — I10 Essential (primary) hypertension: Secondary | ICD-10-CM | POA: Diagnosis not present

## 2018-06-06 DIAGNOSIS — Z79899 Other long term (current) drug therapy: Secondary | ICD-10-CM

## 2018-06-06 DIAGNOSIS — E782 Mixed hyperlipidemia: Secondary | ICD-10-CM | POA: Diagnosis not present

## 2018-06-06 DIAGNOSIS — R7309 Other abnormal glucose: Secondary | ICD-10-CM | POA: Diagnosis not present

## 2018-06-06 DIAGNOSIS — I480 Paroxysmal atrial fibrillation: Secondary | ICD-10-CM

## 2018-06-06 NOTE — Patient Instructions (Signed)

## 2018-06-06 NOTE — Progress Notes (Signed)
This very nice 84 y.o. MWM presents for 6 month follow up with HTN, HLD, Pre-Diabetes and Vitamin D Deficiency. [name] Yu had Rt THR in May by Dr [name] & he's still recovering and using his Rollator for gait stability.      [name] Yu is treated for HTN  (2005) & BP has been controlled at home. Today's BP is at goal - 116/64.  [name] Yu has hx/o pAfib (2007)  & was on Coumadin until has a large unprovoked rectus sheath hematoma and now takes only LD bASA. [name] Yu had a negative heart Cath in 2003 and a negative Cardiolite x 2  in 2005 & 2007.  [name] Yu has had no complaints of any cardiac type chest pain, palpitations, dyspnea / orthopnea / PND, dizziness, claudication, or dependent edema.     Hyperlipidemia is not controlled with diet & meds. [name] Yu denies myalgias or other med SE's. Last Lipids were not at goal and not aggressively treated for age considerations.  Lab Results  Component Value Date   CHOL 191 10/29/2017   HDL 47 10/29/2017   LDLCALC 113 (H) 10/29/2017   TRIG 184 (H) 10/29/2017   CHOLHDL 4.1 10/29/2017      Also, the [name] Yu has history of PreDiabetes (A1c 5.7%/2014)  and has had no symptoms of reactive hypoglycemia, diabetic polys, paresthesias or visual blurring.  Last A1c was at goal: Lab Results  Component Value Date   HGBA1C 5.5 10/29/2017      Further, the [name] Yu also has history of Vitamin D Deficiency ("33"/2008)  and supplements vitamin D without any suspected side-effects. Last vitamin D was at goal: Lab Results  Component Value Date   VD25OH 67 10/29/2017   Current Outpatient Medications on File Prior to Visit  Medication Sig  . aspirin EC 81 MG tablet Take 81 mg by mouth daily.  . Calcium Carbonate Antacid (TUMS E-X PO) Take 2 tablets by mouth daily.  . Cholecalciferol 5000 UNITS TABS Take 1 tablet by mouth daily.  . citalopram (CELEXA) 40 MG tablet Take 1 tablet (40 mg total) by mouth daily. ([name] Yu taking differently: Take 20 mg by mouth daily. )  .  diltiazem (CARDIZEM) 60 MG tablet Take 1 tablet (60 mg total) by mouth 2 (two) times daily.  . finasteride (PROSCAR) 5 MG tablet TAKE ONE TABLET BY MOUTH DAILY  . LORazepam (ATIVAN) 2 MG tablet TAKE 1/2 TO 1 TABLET BY MOUTH ONLY IF NEEDED AT HOUR OF SLEEP  . Magnesium 200 MG TABS Take 1 tablet by mouth daily.  . Multiple Vitamin (MULTIVITAMIN) capsule Take 1 capsule by mouth daily.  . Omega-3 Fatty Acids (FISH OIL PO) Take 1 capsule by mouth daily.   . vitamin C (ASCORBIC ACID) 500 MG tablet Take 1,000 mg by mouth daily.   No current facility-administered medications on file prior to visit.    Allergies  Allergen Reactions  . Gabapentin     Confusion   . Ciprofloxacin     dysphoria  . Novocain [Procaine Hcl]   . Prednisone     Nervousness   PMHx:   Past Medical History:  Diagnosis Date  . Arthritis   . ASHD (arteriosclerotic heart disease)   . Atrial fib/flutter, transient    off coumadin since 11/2013- CHADSVAC of 2  . Cancer (HCC)    BASAL CELL   . Colon polyps    2003 hyperplastic and tubuler adenoma  . Depression   . Diverticulosis   . Dyslipidemia   . Dysrhythmia   .  History of kidney stones   . Hypertension   . OSA (obstructive sleep apnea)    NO CPAP IN 10+ YEARS  . Pre-diabetes   . Rectus sheath hematoma 11/21/2013   Dr. Deatra Ina    Immunization History  Administered Date(s) Administered  . DT 07/06/2014  . Influenza Split 07/20/2014, 07/18/2016  . Influenza, High Dose Seasonal PF 08/22/2015, 07/15/2017  . Pneumococcal Conjugate-13 07/06/2014  . Pneumococcal Polysaccharide-23 06/09/2013  . Td 12/15/2016  . Zoster 10/15/2005   Past Surgical History:  Procedure Laterality Date  . HERNIA REPAIR    . PROSTATE    . TOTAL HIP ARTHROPLASTY Right 03/07/2018   Procedure: RIGHT TOTAL HIP ARTHROPLASTY DIRECT ANTERIOR;  Surgeon: [name], MD;  Location: [facility];  Service: Orthopedics;  Laterality: Right;   FHx:    Reviewed / unchanged  SHx:    Reviewed /  unchanged   Systems Review:  Constitutional: Denies fever, chills, wt changes, headaches, insomnia, fatigue, night sweats, change in appetite. Eyes: Denies redness, blurred vision, diplopia, discharge, itchy, watery eyes.  ENT: Denies discharge, congestion, post nasal drip, epistaxis, sore throat, earache, hearing loss, dental pain, tinnitus, vertigo, sinus pain, snoring.  CV: Denies chest pain, palpitations, irregular heartbeat, syncope, dyspnea, diaphoresis, orthopnea, PND, claudication or edema. Respiratory: denies cough, dyspnea, DOE, pleurisy, hoarseness, laryngitis, wheezing.  Gastrointestinal: Denies dysphagia, odynophagia, heartburn, reflux, water brash, abdominal pain or cramps, nausea, vomiting, bloating, diarrhea, constipation, hematemesis, melena, hematochezia  or hemorrhoids. Genitourinary: Denies dysuria, frequency, urgency, nocturia, hesitancy, discharge, hematuria or flank pain. Musculoskeletal: Denies arthralgias, myalgias, stiffness, jt. swelling, pain, limping or strain/sprain.  Skin: Denies pruritus, rash, hives, warts, acne, eczema or change in skin lesion(s). Neuro: No weakness, tremor, incoordination, spasms, paresthesia or pain. Psychiatric: Denies confusion, memory loss or sensory loss. Endo: Denies change in weight, skin or hair change.  Heme/Lymph: No excessive bleeding, bruising or enlarged lymph nodes.  Physical Exam  BP 116/64   Pulse 60   Temp 97.7 F (36.5 C)   Resp 16   Ht 5\' 10"  (1.778 m)   Wt 193 lb (87.5 kg)   BMI 27.69 kg/m   Appears  well nourished, well groomed  and in no distress.  Eyes: PERRLA, EOMs, conjunctiva no swelling or erythema. Sinuses: No frontal/maxillary tenderness ENT/Mouth: EAC's clear, TM's nl w/o erythema, bulging. Nares clear w/o erythema, swelling, exudates. Oropharynx clear without erythema or exudates. Oral hygiene is good. Tongue normal, non obstructing. Hearing intact.  Neck: Supple. Thyroid not palpable. Car 2+/2+  without bruits, nodes or JVD. Chest: Respirations nl with BS clear & equal w/o rales, rhonchi, wheezing or stridor.  Cor: Heart soundssoft w/ sl irregular rate and rhythm without sig. Murmurs. Peripheral pulses normal and equal  without edema.  Abdomen: Soft & bowel sounds normal. Non-tender w/o guarding, rebound, hernias, masses or organomegaly.  Lymphatics: Unremarkable.  Musculoskeletal: Full ROM all peripheral extremities, joint stability, 5/5 strength and gait supported by a Rollator.  Skin: Warm, dry without exposed rashes, lesions or ecchymosis apparent.  Neuro: Cranial nerves intact, reflexes equal bilaterally. Sensory-motor testing grossly intact. Tendon reflexes grossly intact.  Pysch: Alert & oriented x 3.  Insight and judgement nl & appropriate. No ideations.  Assessment and Plan:  1. Essential hypertension  - Continue medication, monitor blood pressure at home.  - Continue DASH diet.  Reminder to go to the ER if any CP,  SOB, nausea, dizziness, severe HA, changes vision/speech.  - CBC with Differential/Platelet - COMPLETE METABOLIC PANEL WITH GFR -  Magnesium - TSH  2. Hyperlipidemia, mixed  - Continue diet/meds, exercise,& lifestyle modifications.  - Continue monitor periodic cholesterol/liver & renal functions   - Lipid panel - TSH  3. Abnormal glucose  - Continue diet, exercise, lifestyle modifications.  - Monitor appropriate labs.  - Hemoglobin A1c - Insulin, random  4. Vitamin D deficiency  - Continue supplementation.   - VITAMIN D 25 Hydroxyl  5. Pre-diabetes  - Hemoglobin A1c - Insulin, random  6. Paroxysmal atrial fibrillation (HCC)  - TSH  7. ASHD (arteriosclerotic heart disease)  8. Medication management  - CBC with Differential/Platelet - COMPLETE METABOLIC PANEL WITH GFR - Magnesium - Lipid panel - TSH - Hemoglobin A1c - Insulin, random - VITAMIN D 25 Hydroxyl       Discussed  regular exercise, BP monitoring, weight control to  achieve/maintain BMI less than 25 and discussed med and SE's. Recommended labs to assess and monitor clinical status with further disposition pending results of labs. Over 30 minutes of exam, counseling, chart review was performed.

## 2018-06-09 ENCOUNTER — Encounter: Payer: Self-pay | Admitting: Internal Medicine

## 2018-06-09 LAB — CBC WITH DIFFERENTIAL/PLATELET
BASOS PCT: 0.4 %
Basophils Absolute: 34 cells/uL (ref 0–200)
EOS ABS: 210 {cells}/uL (ref 15–500)
Eosinophils Relative: 2.5 %
HCT: 43.9 % (ref 38.5–50.0)
Hemoglobin: 14.6 g/dL (ref 13.2–17.1)
Lymphs Abs: 2184 cells/uL (ref 850–3900)
MCH: 29 pg (ref 27.0–33.0)
MCHC: 33.3 g/dL (ref 32.0–36.0)
MCV: 87.1 fL (ref 80.0–100.0)
MONOS PCT: 10.3 %
MPV: 9.1 fL (ref 7.5–12.5)
NEUTROS PCT: 60.8 %
Neutro Abs: 5107 cells/uL (ref 1500–7800)
PLATELETS: 271 10*3/uL (ref 140–400)
RBC: 5.04 10*6/uL (ref 4.20–5.80)
RDW: 12.9 % (ref 11.0–15.0)
TOTAL LYMPHOCYTE: 26 %
WBC mixed population: 865 cells/uL (ref 200–950)
WBC: 8.4 10*3/uL (ref 3.8–10.8)

## 2018-06-09 LAB — COMPLETE METABOLIC PANEL WITH GFR
AG Ratio: 1.7 (calc) (ref 1.0–2.5)
ALBUMIN MSPROF: 4.1 g/dL (ref 3.6–5.1)
ALT: 10 U/L (ref 9–46)
AST: 16 U/L (ref 10–35)
Alkaline phosphatase (APISO): 72 U/L (ref 40–115)
BUN: 19 mg/dL (ref 7–25)
CO2: 28 mmol/L (ref 20–32)
Calcium: 9.9 mg/dL (ref 8.6–10.3)
Chloride: 104 mmol/L (ref 98–110)
Creat: 0.93 mg/dL (ref 0.70–1.11)
GFR, EST AFRICAN AMERICAN: 84 mL/min/{1.73_m2} (ref >=60)
GFR, EST NON AFRICAN AMERICAN: 73 mL/min/{1.73_m2} (ref >=60)
GLOBULIN: 2.4 g/dL (ref 1.9–3.7)
Glucose, Bld: 79 mg/dL (ref 65–99)
Potassium: 4.4 mmol/L (ref 3.5–5.3)
SODIUM: 140 mmol/L (ref 135–146)
TOTAL PROTEIN: 6.5 g/dL (ref 6.1–8.1)
Total Bilirubin: 0.3 mg/dL (ref 0.2–1.2)

## 2018-06-09 LAB — VITAMIN D 25 HYDROXY (VIT D DEFICIENCY, FRACTURES): Vit D, 25-Hydroxy: 60 ng/mL (ref 30–100)

## 2018-06-09 LAB — LIPID PANEL
CHOL/HDL RATIO: 3.5 (calc) (ref <5.0)
Cholesterol: 209 mg/dL — ABNORMAL HIGH (ref <200)
HDL: 59 mg/dL (ref >40)
LDL CHOLESTEROL (CALC): 122 mg/dL — AB
NON-HDL CHOLESTEROL (CALC): 150 mg/dL — AB (ref <130)
TRIGLYCERIDES: 165 mg/dL — AB (ref <150)

## 2018-06-09 LAB — TSH: TSH: 2.61 mIU/L (ref 0.40–4.50)

## 2018-06-09 LAB — HEMOGLOBIN A1C
Hgb A1c MFr Bld: 5.7 % of total Hgb — ABNORMAL HIGH (ref <5.7)
MEAN PLASMA GLUCOSE: 117 (calc)
eAG (mmol/L): 6.5 (calc)

## 2018-06-09 LAB — INSULIN, RANDOM: INSULIN: 5.9 u[IU]/mL (ref 2.0–19.6)

## 2018-06-09 LAB — MAGNESIUM: Magnesium: 2.1 mg/dL (ref 1.5–2.5)

## 2018-07-20 ENCOUNTER — Other Ambulatory Visit: Payer: Self-pay | Admitting: Internal Medicine

## 2018-07-20 DIAGNOSIS — F325 Major depressive disorder, single episode, in full remission: Secondary | ICD-10-CM

## 2018-07-20 DIAGNOSIS — F411 Generalized anxiety disorder: Secondary | ICD-10-CM

## 2018-07-26 DIAGNOSIS — J069 Acute upper respiratory infection, unspecified: Secondary | ICD-10-CM | POA: Diagnosis not present

## 2018-07-26 DIAGNOSIS — R918 Other nonspecific abnormal finding of lung field: Secondary | ICD-10-CM | POA: Diagnosis not present

## 2018-07-26 DIAGNOSIS — R05 Cough: Secondary | ICD-10-CM | POA: Diagnosis not present

## 2018-07-26 DIAGNOSIS — J029 Acute pharyngitis, unspecified: Secondary | ICD-10-CM | POA: Diagnosis not present

## 2018-08-11 ENCOUNTER — Encounter: Payer: Self-pay | Admitting: Internal Medicine

## 2018-08-11 ENCOUNTER — Ambulatory Visit (INDEPENDENT_AMBULATORY_CARE_PROVIDER_SITE_OTHER): Payer: Medicare HMO | Admitting: Internal Medicine

## 2018-08-11 VITALS — BP 108/62 | HR 72 | Temp 97.8°F | Resp 18 | Ht 70.0 in | Wt 194.6 lb

## 2018-08-11 DIAGNOSIS — J0140 Acute pansinusitis, unspecified: Secondary | ICD-10-CM | POA: Diagnosis not present

## 2018-08-11 DIAGNOSIS — K0530 Chronic periodontitis, unspecified: Secondary | ICD-10-CM

## 2018-08-11 DIAGNOSIS — M26623 Arthralgia of bilateral temporomandibular joint: Secondary | ICD-10-CM | POA: Diagnosis not present

## 2018-08-11 DIAGNOSIS — Z8249 Family history of ischemic heart disease and other diseases of the circulatory system: Secondary | ICD-10-CM

## 2018-08-11 HISTORY — DX: Family history of ischemic heart disease and other diseases of the circulatory system: Z82.49

## 2018-08-11 MED ORDER — DEXAMETHASONE 0.5 MG PO TABS: ORAL_TABLET | ORAL | 0 refills | Status: DC

## 2018-08-11 MED ORDER — CEPHALEXIN 500 MG PO CAPS: 500.0000 mg | ORAL_CAPSULE | Freq: Four times a day (QID) | ORAL | 1 refills | Status: AC

## 2018-08-11 NOTE — Progress Notes (Signed)
Subjective:    Patient ID: [name] Yu, male    DOB: 1927/06/13, 84 y.o.   MRN: [id number]  HPI    This very nice 84 yo MWM was seen 2 weeks ago at Urgent Care for Rt hemi-cranial pains of 3-4 days duration. Described as a dull ache. Had CXR which was negative. No treatments/meds were recommended. Then about a week ago he developed Lt sided hemi cranial pains. Denies any neuro signs/sx's. Has had some sinus pressure, PND & congestion.  Medication Sig  . aspirin EC 81 MG Takedaily.  . TUMS E-X  Take 2 tablets  daily.  . Vit D  5000 UNITS  Take 1 tablet  daily.  . citalopram  40 MG TAKE ONE TABLET  DAILY  . diltiazem  60 MG  Take 1 tablet  2 x / daily.  . finasteride  5 MG  TAKE ONE TABLET  DAILY  . LORazepam  2 MG  TAKE 1/2-1 TABLET ONLY IF NEEDED   . Magnesium 200 MG  Take 1 tablet  daily.  . Multiple Vitamin  Take 1 capsule  daily.  . Omega-3 FISH OIL Take 1 capsule  daily.   . vitamin C  500 MG  Take 1,000 mg daily.   Allergies  Allergen Reactions  . Gabapentin     Confusion   . Ciprofloxacin     dysphoria  . Novocain [Procaine Hcl]   . Prednisone     Nervousness   Past Medical History:  Diagnosis Date  . Arthritis   . ASHD (arteriosclerotic heart disease)   . Atrial fib/flutter, transient    off coumadin since 11/2013- CHADSVAC of 2  . Cancer (HCC)    BASAL CELL   . Colon polyps    2003 hyperplastic and tubuler adenoma  . Depression   . Diverticulosis   . Dyslipidemia   . Dysrhythmia   . History of kidney stones   . Hypertension   . OSA (obstructive sleep apnea)    NO CPAP IN 10+ YEARS  . Pre-diabetes   . Rectus sheath hematoma 11/21/2013   Dr. Deatra Ina    Review of Systems   10 point systems review negative except as above.    Objective:   Physical Exam  BP 108/62   Pulse 72   Temp 97.8 F (36.6 C)   Resp 18   Ht 5\' 10"  (1.778 m)   Wt 194 lb 9.6 oz (88.3 kg)   BMI 27.92 kg/m   In No Distress  HEENT -  (+) tender fronto-maxillary areas. EAC's/  TM's - Nl. Exquisite tenderness Lt >> Rt TM jts.O/P shows mild/moderate pyorrhea. Neck - supple.  Chest - Clear equal BS. Cor - Nl HS. RRR w/o sig m.No edema. MS- FROM w/o deformities.  Gait Nl. Neuro -  Nl w/o focal abnormalities.    Assessment & Plan:   1. Subacute pansinusitis  - cephALEXin (KEFLEX) 500 MG capsule; Take 1 capsule (500 mg total) by mouth 4 (four) times daily for 10 days.  Dispense: 60 capsule; Refill: 1  2. Pyorrhea  - cephALEXin (KEFLEX) 500 MG capsule; Take 1 capsule (500 mg total) by mouth 4 (four) times daily for 10 days.  Dispense: 60 capsule; Refill: 1  - discussed oral hygiene   3. Bilateral temporomandibular joint pain  - dexamethasone (DECADRON) 0.5 MG tablet; Take 1 tab 3 x day - 3 days, then 2 x day - 3 days, then 1 tab daily  Dispense: 20 tablet

## 2018-08-11 NOTE — Patient Instructions (Signed)
Temporomandibular Joint Syndrome Temporomandibular joint (TMJ) syndrome is a condition that affects the joints between your jaw and your skull. The TMJs are located near your ears and allow your jaw to open and close. These joints and the nearby muscles are involved in all movements of the jaw. People with TMJ syndrome have pain in the area of these joints and muscles. Chewing, biting, or other movements of the jaw can be difficult or painful. TMJ syndrome can be caused by various things. In many cases, the condition is mild and goes away within a few weeks. For some people, the condition can become a long-term problem. What are the causes? Possible causes of TMJ syndrome include:  Grinding your teeth or clenching your jaw. Some people do this when they are under stress.  Arthritis.  Injury to the jaw.  Head or neck injury.  Teeth or dentures that are not aligned well.  In some cases, the cause of TMJ syndrome may not be known. What are the signs or symptoms? The most common symptom is an aching pain on the side of the head in the area of the TMJ. Other symptoms may include:  Pain when moving your jaw, such as when chewing or biting.  Being unable to open your jaw all the way.  Making a clicking sound when you open your mouth.  Headache.  Earache.  Neck or shoulder pain.  How is this diagnosed? Diagnosis can usually be made based on your symptoms, your medical history, and a physical exam. Your health care provider may check the range of motion of your jaw. Imaging tests, such as X-rays or an MRI, are sometimes done. You may need to see your dentist to determine if your teeth and jaw are lined up correctly. How is this treated? TMJ syndrome often goes away on its own. If treatment is needed, the options may include:  Eating soft foods and applying ice or heat.  Medicines to relieve pain or inflammation.  Medicines to relax the muscles.  A splint, bite plate, or mouthpiece  to prevent teeth grinding or jaw clenching.  Relaxation techniques or counseling to help reduce stress.  Transcutaneous electrical nerve stimulation (TENS). This helps to relieve pain by applying an electrical current through the skin.  Acupuncture. This is sometimes helpful to relieve pain.  Jaw surgery. This is rarely needed.  Follow these instructions at home:  Take medicines only as directed by your health care provider.  Eat a soft diet if you are having trouble chewing.  Apply ice to the painful area. ? Put ice in a plastic bag. ? Place a towel between your skin and the bag. ? Leave the ice on for 20 minutes, 2-3 times a day.  Apply a warm compress to the painful area as directed.  Massage your jaw area and perform any jaw stretching exercises as recommended by your health care provider.  If you were given a mouthpiece or bite plate, wear it as directed.  Avoid foods that require a lot of chewing. Do not chew gum.  Keep all follow-up visits as directed by your health care provider. This is important. Contact a health care provider if:  You are having trouble eating.  You have new or worsening symptoms. Get help right away if:  Your jaw locks open or closed. ++++++++++++++++++++++++++++++  Sinusitis, Adult Sinusitis is soreness and inflammation of your sinuses. Sinuses are hollow spaces in the bones around your face. Your sinuses are located:  Around your eyes.  In the middle of your forehead.  Behind your nose.  In your cheekbones.  Your sinuses and nasal passages are lined with a stringy fluid (mucus). Mucus normally drains out of your sinuses. When your nasal tissues become inflamed or swollen, the mucus can become trapped or blocked so air cannot flow through your sinuses. This allows bacteria, viruses, and funguses to grow, which leads to infection. Sinusitis can develop quickly and last for 7?10 days (acute) or for more than 12 weeks (chronic). Sinusitis  often develops after a cold. What are the causes? This condition is caused by anything that creates swelling in the sinuses or stops mucus from draining, including:  Allergies.  Asthma.  Bacterial or viral infection.  Abnormally shaped bones between the nasal passages.  Nasal growths that contain mucus (nasal polyps).  Narrow sinus openings.  Pollutants, such as chemicals or irritants in the air.  A foreign object stuck in the nose.  A fungal infection. This is rare.  What increases the risk? The following factors may make you more likely to develop this condition:  Having allergies or asthma.  Having had a recent cold or respiratory tract infection.  Having structural deformities or blockages in your nose or sinuses.  Having a weak immune system.  Doing a lot of swimming or diving.  Overusing nasal sprays.  Smoking.  What are the signs or symptoms? The main symptoms of this condition are pain and a feeling of pressure around the affected sinuses. Other symptoms include:  Upper toothache.  Earache.  Headache.  Bad breath.  Decreased sense of smell and taste.  A cough that may get worse at night.  Fatigue.  Fever.  Thick drainage from your nose. The drainage is often green and it may contain pus (purulent).  Stuffy nose or congestion.  Postnasal drip. This is when extra mucus collects in the throat or back of the nose.  Swelling and warmth over the affected sinuses.  Sore throat.  Sensitivity to light.  How is this diagnosed? This condition is diagnosed based on symptoms, a medical history, and a physical exam. To find out if your condition is acute or chronic, your health care provider may:  Look in your nose for signs of nasal polyps.  Tap over the affected sinus to check for signs of infection.  View the inside of your sinuses using an imaging device that has a light attached (endoscope).  If your health care provider suspects that you  have chronic sinusitis, you may also:  Be tested for allergies.  Have a sample of mucus taken from your nose (nasal culture) and checked for bacteria.  Have a mucus sample examined to see if your sinusitis is related to an allergy.  If your sinusitis does not respond to treatment and it lasts longer than 8 weeks, you may have an MRI or CT scan to check your sinuses. These scans also help to determine how severe your infection is. In rare cases, a bone biopsy may be done to rule out more serious types of fungal sinus disease. How is this treated? Treatment for sinusitis depends on the cause and whether your condition is chronic or acute. If a virus is causing your sinusitis, your symptoms will go away on their own within 10 days. You may be given medicines to relieve your symptoms, including:  Topical nasal decongestants. They shrink swollen nasal passages and let mucus drain from your sinuses.  Antihistamines. These drugs block inflammation that is triggered by allergies.  This can help to ease swelling in your nose and sinuses.  Topical nasal corticosteroids. These are nasal sprays that ease inflammation and swelling in your nose and sinuses.  Nasal saline washes. These rinses can help to get rid of thick mucus in your nose.  If your condition is caused by bacteria, you will be given an antibiotic medicine. If your condition is caused by a fungus, you will be given an antifungal medicine. Surgery may be needed to correct underlying conditions, such as narrow nasal passages. Surgery may also be needed to remove polyps. Follow these instructions at home: Medicines  Take, use, or apply over-the-counter and prescription medicines only as told by your health care provider. These may include nasal sprays.  If you were prescribed an antibiotic medicine, take it as told by your health care provider. Do not stop taking the antibiotic even if you start to feel better. Hydrate and Humidify  Drink  enough water to keep your urine clear or pale yellow. Staying hydrated will help to thin your mucus.  Use a cool mist humidifier to keep the humidity level in your home above 50%.  Inhale steam for 10-15 minutes, 3-4 times a day or as told by your health care provider. You can do this in the bathroom while a hot shower is running.  Limit your exposure to cool or dry air. Rest  Rest as much as possible.  Sleep with your head raised (elevated).  Make sure to get enough sleep each night. General instructions  Apply a warm, moist washcloth to your face 3-4 times a day or as told by your health care provider. This will help with discomfort.  Wash your hands often with soap and water to reduce your exposure to viruses and other germs. If soap and water are not available, use hand sanitizer.  Do not smoke. Avoid being around people who are smoking (secondhand smoke).  Keep all follow-up visits as told by your health care provider. This is important. Contact a health care provider if:  You have a fever.  Your symptoms get worse.  Your symptoms do not improve within 10 days. Get help right away if:  You have a severe headache.  You have persistent vomiting.  You have pain or swelling around your face or eyes.  You have vision problems.  You develop confusion.  Your neck is stiff.  You have trouble breathing.  ++++++++++++++++++++++++++++++  Periodontal Disease Periodontal disease, also called gum disease or gingivitis, is inflammation, infection, or both that affects the tissue that surrounds and supports the teeth (periodontal tissue). Periodontal tissue includes the gums, the tissues (ligaments) that hold the teeth in place, and the tooth sockets (alveolar bones). Periodontal disease can affect tissue around one tooth or many teeth. If this condition is not treated, it can cause you to lose a tooth. What are the causes? This condition is usually caused by plaque. Plaque  contains harmful bacteria that can cause gums to become swollen and infected. If periodontal disease gets worse (progresses), it can also damage other supporting tissues. Plaque can develop due to poor oral care, such as not brushing teeth enough, not visiting the dentist regularly, or eating and drinking too many sugary foods and beverages. What increases the risk? This condition is more likely to develop in people who:  Smoke.  Use tobacco.  Clench or grind their teeth.  Abuse substances.  Are going through the hormonal changes of puberty, menopause, or pregnancy.  Are under stress.  Are taking certain medicines, such as steroids, antiseizure medicines, or medicines to treat cancer.  Have diabetes.  Have poor nutrition.  Have a disease that interferes with the body's disease-fighting system (immune system).  Have a family history of periodontal disease.  What are the signs or symptoms? Symptoms of this condition include:  Red or swollen gums.  Bad breath that does not go away.  Gums that have pulled away from the teeth.  Gums that bleed easily.  Teeth that are loose or separating.  Pain when chewing.  Changes in the way your teeth fit together.  Sensitive teeth.  How is this diagnosed? This condition is diagnosed with an exam of the tissue around your teeth. Your health care provider may also take an X-ray of your teeth and ask about your medical history. How is this treated? Treatment for this condition depends on the extent of the disease, which is determined by a dental exam. Treatment may include:  Brushing and flossing regularly.  A deep dental cleaning that involves scraping the buildup of plaque and tartar from below the gum line (scaling and root planing). This may be needed if the disease progresses.  Antibiotic medicines. These may be taken by mouth (orally) or as a rinse.  Surgery, in some severe cases. This may involve a surgery to lift up the  gums and remove tartar deposits or to reduce a pocket of periodontal disease (flap surgery). Surgery might also involve procedures that replace damaged areas and help healthy bone and tissue to grow (bone and tissue grafting).  Follow these instructions at home:  Practice good oral hygiene: ? Brush your teeth two times a day with a soft toothbrush. ? Floss between your teeth every day. ? Get regular dental exams.  If you were prescribed antibiotic medicine or a rinse, take or use it as told by your health care provider. Do not stop taking or using the antibiotic even if your condition improves.  Take over-the-counter and prescription medicines only as told by your health care provider.  Do not use any products that contain nicotine or tobacco, such as cigarettes and e-cigarettes. If you need help quitting, ask your health care provider.  Eat a well-balanced diet that includes plenty of vegetables, fruits, whole grains, low-fat dairy products, and lean protein. ? Do not eat a lot of foods that are high in solid fats, added sugars, or salt. ? Avoid beverages that contain a lot of sugar. Contact a health care provider if:  Your symptoms do not improve. Get help right away if:  You have swelling in your face, neck, or jaw.  You have severe pain that does not get better with medicine.  You have a fever. Summary  Periodontal disease, also called gum disease or gingivitis, is inflammation, infection, or both that affects the tissue that surrounds and supports the teeth (periodontal tissue).  Practice good oral hygiene. Brush your teeth two times a day with a soft toothbrush. Floss between your teeth every day.  Do not use any products that contain nicotine or tobacco, such as cigarettes and e-cigarettes. If you need help quitting, ask your health care provider.  If you were prescribed antibiotic medicine or a rinse, take or use it as told by your health care provider. Do not stop taking  or using the antibiotic even if your condition improves.

## 2018-09-18 ENCOUNTER — Ambulatory Visit: Payer: Self-pay | Admitting: Internal Medicine

## 2018-10-14 ENCOUNTER — Other Ambulatory Visit: Payer: Self-pay | Admitting: Internal Medicine

## 2018-10-14 MED ORDER — AZITHROMYCIN 250 MG PO TABS: ORAL_TABLET | ORAL | 1 refills | Status: DC

## 2018-10-14 MED ORDER — DEXAMETHASONE 0.5 MG PO TABS: ORAL_TABLET | ORAL | 0 refills | Status: DC

## 2018-10-28 ENCOUNTER — Other Ambulatory Visit: Payer: Self-pay | Admitting: Internal Medicine

## 2018-10-28 DIAGNOSIS — F411 Generalized anxiety disorder: Secondary | ICD-10-CM

## 2018-10-28 DIAGNOSIS — N400 Enlarged prostate without lower urinary tract symptoms: Secondary | ICD-10-CM

## 2018-10-28 DIAGNOSIS — F325 Major depressive disorder, single episode, in full remission: Secondary | ICD-10-CM

## 2018-10-28 MED ORDER — CITALOPRAM HYDROBROMIDE 40 MG PO TABS: ORAL_TABLET | ORAL | 3 refills | Status: DC

## 2018-10-28 MED ORDER — LORAZEPAM 2 MG PO TABS: ORAL_TABLET | ORAL | 0 refills | Status: DC

## 2018-10-28 MED ORDER — FINASTERIDE 5 MG PO TABS: ORAL_TABLET | ORAL | 3 refills | Status: DC

## 2018-10-29 ENCOUNTER — Telehealth: Payer: Self-pay

## 2018-10-29 MED ORDER — DILTIAZEM HCL 60 MG PO TABS: 60.0000 mg | ORAL_TABLET | Freq: Two times a day (BID) | ORAL | 0 refills | Status: DC

## 2018-10-29 NOTE — Telephone Encounter (Signed)
[facility] is requesting refill of Diltiazem. Most recent OV states patient should be taking only 1/2 tab please review for refill.

## 2018-10-29 NOTE — Telephone Encounter (Signed)
I spoke with [name] Yu (ok per DPR) to clarify how [name] Yu is currently taking his diltiazem.   Per [name] Yu, the patient is still taking diltiazem 60 mg BID. I advised we will send a refill to the pharmacy for him. They would like this to go to [facility].

## 2018-11-20 ENCOUNTER — Encounter: Payer: Self-pay | Admitting: Internal Medicine

## 2018-11-20 NOTE — Patient Instructions (Signed)

## 2018-11-20 NOTE — Progress Notes (Signed)
[facility]   [name], M.D.     [facility]. [name], P.A.-C [name], [address]                [place] [address] Telephone [phone] Telefax [phone] Annual  Screening/Preventative Visit  & Comprehensive Evaluation & Examination     This very nice 85 y.o. MWM presents for a Screening /Preventative Visit & comprehensive evaluation and management of multiple medical co-morbidities.  Patient has been followed for HTN, HLD, T2_NIDDM  Prediabetes and Vitamin D Deficiency.     HTN predates since 2005. Patient's BP has been controlled at home. Today's BP is at goal - 134/82. Patient has hx/o pAfib circa 2007 and was on Coumadin til he sustained a large abdominal rectus sheath hematoma & his coumadin was stopped by his [facility] cardiologist. In 2003, patient had a negative Heart Cath and in 2005 & 2007 he had negative Cardiolite's  x 2.  Patient denies any cardiac symptoms as chest pain, palpitations, shortness of breath, dizziness or ankle swelling.     Patient's hyperlipidemia is controlled with diet and medications. Patient denies myalgias or other medication SE's. Last lipids were not at goal: Lab Results  Component Value Date   CHOL 209 (H) 06/06/2018   HDL 59 06/06/2018   LDLCALC 122 (H) 06/06/2018   TRIG 165 (H) 06/06/2018   CHOLHDL 3.5 06/06/2018      Patient has hx/o prediabetes  (A1c 5.7% / 2014) and patient denies reactive hypoglycemic symptoms, visual blurring, diabetic polys or paresthesias. Last A1c was not at goal: Lab Results  Component Value Date   HGBA1C 5.7 (H) 06/06/2018       Finally, patient has history of Vitamin D Deficiency ("33" / 2008) and last vitamin D was at goal: Lab Results  Component Value Date   VD25OH 60 06/06/2018   Current Outpatient Medications on File Prior to Visit  Medication Sig  . aspirin EC 81 MG tablet Take 81 mg by  mouth daily.  . Calcium Carbonate Antacid (TUMS E-X PO) Take 2 tablets by mouth daily.  . Cholecalciferol 5000 UNITS TABS Take 1 tablet by mouth daily.  . citalopram (CELEXA) 40 MG tablet Take 1 tablet daily for Mood  . diltiazem (CARDIZEM) 60 MG tablet Take 1 tablet (60 mg total) by mouth 2 (two) times daily.  . finasteride (PROSCAR) 5 MG tablet Take 1 tablet daily for Prostate  . LORazepam (ATIVAN) 2 MG tablet Take 1/2-1 tablet at hour of sleep ONLY if needed &  limit to 5 days /week to avoid addiction  . Magnesium 200 MG TABS Take 1 tablet by mouth daily.  . Multiple Vitamin (MULTIVITAMIN) capsule Take 1 capsule by mouth daily.  . Omega-3 Fatty Acids (FISH OIL PO) Take 1 capsule by mouth daily.   . vitamin C (ASCORBIC ACID) 500 MG tablet Take 1,000 mg by mouth daily.  Marland Kitchen azithromycin (ZITHROMAX) 250 MG tablet Take 2 tablets (500 mg) on  Day 1,  followed by 1 tablet (250 mg) once daily on Days 2 through 5.   No current facility-administered medications on file prior to visit.    Allergies  Allergen Reactions  . Gabapentin     Confusion   . Ciprofloxacin     dysphoria  . Novocain [Procaine Hcl]   . Prednisone  Nervousness   Past Medical History:  Diagnosis Date  . Arthritis   . ASHD (arteriosclerotic heart disease)   . Atrial fib/flutter, transient    off coumadin since 11/2013- CHADSVAC of 2  . Cancer (HCC)    BASAL CELL   . Colon polyps    2003 hyperplastic and tubuler adenoma  . Depression   . Diverticulosis   . Dyslipidemia   . Dysrhythmia   . History of kidney stones   . Hypertension   . OSA (obstructive sleep apnea)    NO CPAP IN 10+ YEARS  . Pre-diabetes   . Rectus sheath hematoma 11/21/2013   Dr. Deatra Ina    Health Maintenance  Topic Date Due  . INFLUENZA VACCINE  05/15/2018  . TETANUS/TDAP  12/16/2026  . PNA vac Low Risk Adult  Completed   Immunization History  Administered Date(s) Administered  . DT 07/06/2014  . Influenza Split 07/20/2014, 07/18/2016   . Influenza, High Dose Seasonal PF 08/22/2015, 07/15/2017  . Pneumococcal Conjugate-13 07/06/2014  . Pneumococcal Polysaccharide-23 06/09/2013  . Td 12/15/2016  . Zoster 10/15/2005   Last Colon - 03/11/2002 - Dr Deatra Ina recc f/u 10 year   Past Surgical History:  Procedure Laterality Date  . HERNIA REPAIR    . PROSTATE    . TOTAL HIP ARTHROPLASTY Right 03/07/2018   Procedure: RIGHT TOTAL HIP ARTHROPLASTY DIRECT ANTERIOR;  Surgeon: [name], MD;  Location: [facility];  Service: Orthopedics;  Laterality: Right;   Family History  Problem Relation Age of Onset  . Alzheimer's disease Mother   . Kidney cancer Father   . Diabetes Brother   . Breast cancer Sister   . Rectal cancer Sister   . Breast cancer Daughter   . Ulcerative colitis Sister   . Heart disease Brother   . Heart disease Sister    Social History   Socioeconomic History  . Marital status: Married    Spouse name: Not on file  . Number of children: 5  Occupational History  . 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 Smoking status: Former Smoker    Last attempt to quit: 09/14/1993    Years since quitting: 25.2  . Smokeless tobacco: Never Used  Substance and Sexual Activity  . Alcohol use: No  . Drug use: No  . Sexual activity: Not on file    ROS Constitutional: Denies fever, chills, weight loss/gain, headaches, insomnia,  night sweats or change in appetite. Does c/o fatigue. Eyes: Denies redness, blurred vision, diplopia, discharge, itchy or watery eyes.  ENT: Denies discharge, congestion, post nasal drip, epistaxis, sore throat, earache, hearing loss, dental pain, Tinnitus, Vertigo, Sinus pain or snoring.  Cardio: Denies chest pain, palpitations, irregular heartbeat, syncope, dyspnea, diaphoresis, orthopnea, PND, claudication or edema Respiratory: denies cough, dyspnea, DOE, pleurisy, hoarseness, laryngitis or wheezing.  Gastrointestinal: Denies dysphagia, heartburn, reflux, water  brash, pain, cramps, nausea, vomiting, bloating, diarrhea, constipation, hematemesis, melena, hematochezia, jaundice or hemorrhoids Genitourinary: Denies dysuria, frequency, urgency, nocturia, hesitancy, discharge, hematuria or flank pain Musculoskeletal: Denies arthralgia, myalgia, stiffness, Jt. Swelling, pain, limp or strain/sprain. Denies Falls. Skin: Denies puritis, rash, hives, warts, acne, eczema or change in skin lesion Neuro: No weakness, tremor, incoordination, spasms, paresthesia or pain Psychiatric: Denies confusion, memory loss or sensory loss. Denies Depression. Endocrine: Denies change in weight, skin, hair change, nocturia, and paresthesia, diabetic polys, visual blurring or hyper / hypo glycemic episodes.  Heme/Lymph: No excessive bleeding, bruising or enlarged lymph nodes.  Physical Exam  BP 134/82   Pulse 76   Temp 97.7 F (36.5 C)   Ht 5' 9.5" (1.765 m)   Wt 196 lb 9.6 oz (89.2 kg)   SpO2 95%   BMI 28.62 kg/m   General Appearance: Well nourished and well groomed and in no apparent distress.  Eyes: PERRLA, EOMs, conjunctiva no swelling or erythema, normal fundi and vessels. Sinuses: No frontal/maxillary tenderness ENT/Mouth: EACs patent / TMs  nl. Nares clear without erythema, swelling, mucoid exudates. Oral hygiene is good. No erythema, swelling, or exudate. Tongue normal, non-obstructing. Tonsils not swollen or erythematous. Hearing normal.  Neck: Supple, thyroid not palpable. No bruits, nodes or JVD. Respiratory: Respiratory effort normal.  BS equal and clear bilateral without rales, rhonci, wheezing or stridor. Cardio: Heart sounds are normal with regular rate and rhythm and no murmurs, rubs or gallops. Peripheral pulses are normal and equal bilaterally without edema. No aortic or femoral bruits. Chest: symmetric with normal excursions and percussion.  Abdomen: Soft, with Nl bowel sounds. Nontender, no guarding, rebound, hernias, masses, or organomegaly.   Lymphatics: Non tender without lymphadenopathy.  Musculoskeletal: Full ROM all peripheral extremities, joint stability, 5/5 strength, and normal gait. Skin: Warm and dry without rashes, lesions, cyanosis, clubbing or  ecchymosis.  Neuro: Cranial nerves intact, reflexes equal bilaterally. Normal muscle tone, no cerebellar symptoms. Sensation intact.  Pysch: Alert and oriented X 3 with normal affect, insight and judgment appropriate.   Assessment and Plan  1. Annual Preventative/Screening Exam   2. Essential hypertension  - EKG 12-Lead - [place], RETROPERITNL ABD,  LTD - Urinalysis, Routine w reflex microscopic - Microalbumin / creatinine urine ratio - CBC with Differential/Platelet - COMPLETE METABOLIC PANEL WITH GFR - Magnesium - TSH  3. Hyperlipidemia, mixed  - EKG 12-Lead - [place], RETROPERITNL ABD,  LTD - Lipid panel - TSH  4. Abnormal glucose  - EKG 12-Lead - [place], RETROPERITNL ABD,  LTD - Hemoglobin A1c - Insulin, random  5. Vitamin D deficiency  - VITAMIN D 25 Hydroxyl  6. Pre-diabetes  - Hemoglobin A1c - Insulin, random  7. Paroxysmal atrial fibrillation (HCC)  - EKG 12-Lead - TSH  8. ASHD (arteriosclerotic heart disease)  - EKG 12-Lead - [place], RETROPERITNL ABD,  LTD  9. Screening for colorectal cancer  - POC Hemoccult Bld/Stl   10. Screening for ischemic heart disease   11. Screening for AAA (aortic abdominal aneurysm)   12. 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 Prostate cancer screening  - PSA     Patient was counseled in prudent diet, weight control to achieve/maintain BMI less than 25, BP monitoring, regular exercise and medications as discussed.  Discussed med effects and SE's. Routine screening labs and tests as requested with regular follow-up as recommended. Over 40 minutes of exam, counseling, chart review  and high complex critical decision making was performed

## 2018-11-21 ENCOUNTER — Encounter: Payer: Self-pay | Admitting: Internal Medicine

## 2018-11-21 ENCOUNTER — Ambulatory Visit (INDEPENDENT_AMBULATORY_CARE_PROVIDER_SITE_OTHER): Payer: Medicare Other | Admitting: Internal Medicine

## 2018-11-21 VITALS — BP 134/82 | HR 76 | Temp 97.7°F | Ht 69.5 in | Wt 196.6 lb

## 2018-11-21 DIAGNOSIS — Z0001 Encounter for general adult medical examination with abnormal findings: Secondary | ICD-10-CM

## 2018-11-21 DIAGNOSIS — E559 Vitamin D deficiency, unspecified: Secondary | ICD-10-CM

## 2018-11-21 DIAGNOSIS — Z0000 Encounter for general adult medical examination without abnormal findings: Secondary | ICD-10-CM

## 2018-11-21 DIAGNOSIS — N401 Enlarged prostate with lower urinary tract symptoms: Secondary | ICD-10-CM

## 2018-11-21 DIAGNOSIS — Z1211 Encounter for screening for malignant neoplasm of colon: Secondary | ICD-10-CM

## 2018-11-21 DIAGNOSIS — R7303 Prediabetes: Secondary | ICD-10-CM

## 2018-11-21 DIAGNOSIS — E782 Mixed hyperlipidemia: Secondary | ICD-10-CM | POA: Diagnosis not present

## 2018-11-21 DIAGNOSIS — I2510 Atherosclerotic heart disease of native coronary artery without angina pectoris: Secondary | ICD-10-CM

## 2018-11-21 DIAGNOSIS — Z136 Encounter for screening for cardiovascular disorders: Secondary | ICD-10-CM | POA: Diagnosis not present

## 2018-11-21 DIAGNOSIS — I480 Paroxysmal atrial fibrillation: Secondary | ICD-10-CM | POA: Diagnosis not present

## 2018-11-21 DIAGNOSIS — R7309 Other abnormal glucose: Secondary | ICD-10-CM

## 2018-11-21 DIAGNOSIS — Z79899 Other long term (current) drug therapy: Secondary | ICD-10-CM

## 2018-11-21 DIAGNOSIS — I10 Essential (primary) hypertension: Secondary | ICD-10-CM | POA: Diagnosis not present

## 2018-11-21 DIAGNOSIS — Z8249 Family history of ischemic heart disease and other diseases of the circulatory system: Secondary | ICD-10-CM

## 2018-11-21 DIAGNOSIS — N138 Other obstructive and reflux uropathy: Secondary | ICD-10-CM

## 2018-11-21 DIAGNOSIS — Z125 Encounter for screening for malignant neoplasm of prostate: Secondary | ICD-10-CM

## 2018-11-21 DIAGNOSIS — Z1212 Encounter for screening for malignant neoplasm of rectum: Secondary | ICD-10-CM

## 2018-11-23 ENCOUNTER — Encounter: Payer: Self-pay | Admitting: Internal Medicine

## 2018-11-24 LAB — CBC WITH DIFFERENTIAL/PLATELET
Absolute Monocytes: 792 cells/uL (ref 200–950)
Basophils Absolute: 36 cells/uL (ref 0–200)
Basophils Relative: 0.4 %
Eosinophils Absolute: 126 cells/uL (ref 15–500)
Eosinophils Relative: 1.4 %
HEMATOCRIT: 48.7 % (ref 38.5–50.0)
Hemoglobin: 16.7 g/dL (ref 13.2–17.1)
Lymphs Abs: 2187 cells/uL (ref 850–3900)
MCH: 31.4 pg (ref 27.0–33.0)
MCHC: 34.3 g/dL (ref 32.0–36.0)
MCV: 91.5 fL (ref 80.0–100.0)
MPV: 9.4 fL (ref 7.5–12.5)
Monocytes Relative: 8.8 %
Neutro Abs: 5859 cells/uL (ref 1500–7800)
Neutrophils Relative %: 65.1 %
Platelets: 267 10*3/uL (ref 140–400)
RBC: 5.32 10*6/uL (ref 4.20–5.80)
RDW: 13.2 % (ref 11.0–15.0)
Total Lymphocyte: 24.3 %
WBC: 9 10*3/uL (ref 3.8–10.8)

## 2018-11-24 LAB — URINALYSIS, ROUTINE W REFLEX MICROSCOPIC
Bacteria, UA: NONE SEEN /HPF
Bilirubin Urine: NEGATIVE
GLUCOSE, UA: NEGATIVE
Hgb urine dipstick: NEGATIVE
Hyaline Cast: NONE SEEN /LPF
Ketones, ur: NEGATIVE
NITRITE: NEGATIVE
Protein, ur: NEGATIVE
RBC / HPF: NONE SEEN /HPF (ref 0–2)
Specific Gravity, Urine: 1.017 (ref 1.001–1.03)
Squamous Epithelial / HPF: NONE SEEN /HPF (ref <=5)
WBC, UA: NONE SEEN /HPF (ref 0–5)
pH: 7.5 (ref 5.0–8.0)

## 2018-11-24 LAB — MICROALBUMIN / CREATININE URINE RATIO
Creatinine, Urine: 94 mg/dL (ref 20–320)
Microalb Creat Ratio: 23 mcg/mg creat (ref <30)
Microalb, Ur: 2.2 mg/dL

## 2018-11-24 LAB — COMPLETE METABOLIC PANEL WITH GFR
AG Ratio: 2 (calc) (ref 1.0–2.5)
ALT: 13 U/L (ref 9–46)
AST: 16 U/L (ref 10–35)
Albumin: 4.3 g/dL (ref 3.6–5.1)
Alkaline phosphatase (APISO): 64 U/L (ref 35–144)
BUN: 13 mg/dL (ref 7–25)
CALCIUM: 9.9 mg/dL (ref 8.6–10.3)
CO2: 29 mmol/L (ref 20–32)
CREATININE: 0.89 mg/dL (ref 0.70–1.11)
Chloride: 101 mmol/L (ref 98–110)
GFR, EST NON AFRICAN AMERICAN: 76 mL/min/{1.73_m2} (ref >=60)
GFR, Est African American: 88 mL/min/{1.73_m2} (ref >=60)
Globulin: 2.2 g/dL (calc) (ref 1.9–3.7)
Glucose, Bld: 85 mg/dL (ref 65–99)
Potassium: 4 mmol/L (ref 3.5–5.3)
Sodium: 140 mmol/L (ref 135–146)
Total Bilirubin: 0.6 mg/dL (ref 0.2–1.2)
Total Protein: 6.5 g/dL (ref 6.1–8.1)

## 2018-11-24 LAB — VITAMIN D 25 HYDROXY (VIT D DEFICIENCY, FRACTURES): Vit D, 25-Hydroxy: 58 ng/mL (ref 30–100)

## 2018-11-24 LAB — LIPID PANEL
Cholesterol: 231 mg/dL — ABNORMAL HIGH (ref <200)
HDL: 59 mg/dL (ref >=40)
LDL Cholesterol (Calc): 140 mg/dL (calc) — ABNORMAL HIGH
Non-HDL Cholesterol (Calc): 172 mg/dL (calc) — ABNORMAL HIGH (ref <130)
Total CHOL/HDL Ratio: 3.9 (calc) (ref <5.0)
Triglycerides: 182 mg/dL — ABNORMAL HIGH (ref <150)

## 2018-11-24 LAB — HEMOGLOBIN A1C
Hgb A1c MFr Bld: 5.7 % of total Hgb — ABNORMAL HIGH (ref <5.7)
Mean Plasma Glucose: 117 (calc)
eAG (mmol/L): 6.5 (calc)

## 2018-11-24 LAB — INSULIN, RANDOM: Insulin: 4.3 u[IU]/mL (ref 2.0–19.6)

## 2018-11-24 LAB — TSH: TSH: 2.61 mIU/L (ref 0.40–4.50)

## 2018-11-24 LAB — PSA: PSA: 3.2 ng/mL (ref <=4.0)

## 2018-11-24 LAB — MAGNESIUM: Magnesium: 2.1 mg/dL (ref 1.5–2.5)

## 2018-12-04 ENCOUNTER — Other Ambulatory Visit: Payer: Self-pay

## 2018-12-04 DIAGNOSIS — Z1211 Encounter for screening for malignant neoplasm of colon: Secondary | ICD-10-CM

## 2018-12-04 DIAGNOSIS — Z1212 Encounter for screening for malignant neoplasm of rectum: Principal | ICD-10-CM

## 2018-12-04 LAB — POC HEMOCCULT BLD/STL (HOME/3-CARD/SCREEN)
Card #2 Fecal Occult Blod, POC: NEGATIVE
Card #3 Fecal Occult Blood, POC: NEGATIVE
Fecal Occult Blood, POC: NEGATIVE

## 2018-12-08 ENCOUNTER — Other Ambulatory Visit: Payer: Self-pay | Admitting: Internal Medicine

## 2018-12-08 ENCOUNTER — Other Ambulatory Visit: Payer: Self-pay | Admitting: Cardiovascular Disease

## 2018-12-08 DIAGNOSIS — F411 Generalized anxiety disorder: Secondary | ICD-10-CM

## 2018-12-22 NOTE — Progress Notes (Signed)
Assessment and Plan:  [name] Yu was seen today for acute visit for ulceration on left side of tongue.  Discussed oral hygiene and avoiding aggravating foods.  Discussed monitoring symptoms and contacting office with new or worsening symptoms.  Received in office dexamethasone IM, prednisone is an intolerance and makes patients' mood change negatively, anger and short tempered, can tolerate medication if necessary.  Diagnoses and all orders for this visit:  Ulcerated tongue -     dexamethasone (DECADRON) injection 10 mg -     cephALEXin (KEFLEX) 500 MG capsule; Take one capsule twice a day, every 12 hours for 10days. -     Diphenhyd-Hydrocort-Nystatin (FIRST-DUKES MOUTHWASH) SUSP; 1 tsp swish and swallow q 2 hours as needed  Gingival enlargement -     dexamethasone (DECADRON) injection 10 mg -     Diphenhyd-Hydrocort-Nystatin (FIRST-DUKES MOUTHWASH) SUSP; 1 tsp swish and swallow q 2 hours as needed  Bilateral temporomandibular joint pain Doing well with this Slightly sore, increased lcentching related to mouth discomfprt  Senile purpura (HCC) Taking ASA daily No S&S of bleeding Continue to monitor.   Discussed hospital precautions with patient and agrees with plan of care.   Call or return with new or worsening symptoms as discussed in appointment.  May contact office via phone [phone] or [place].   Further disposition pending results of labs. Discussed med's effects and SE's.   Over 30 minutes of exam, counseling, chart review, and critical decision making was performed.   Future Appointments  Date Time Provider [place]  12/23/2018 11:15 AM Garnet Sierras, NP [facility] None  01/02/2019 11:00 AM Minna Merritts, MD [facility]  02/23/2019 11:15 AM [name], NP [facility] None  06/08/2019 10:30 AM [name], MD [facility] None  12/16/2019 10:00 AM [name], MD [facility] None     ------------------------------------------------------------------------------------------------------------------   HPI 85 y.o.male presents for sore  Under his tounge that started two weeks ago.  He reports it started on the tip of his tounge and went down the right side of his check.  Now it has progressived to a large sore on the underside of his tounge and also on the gums.  Sliht cough with some phlem . Denies and fever or chills.  He has been eating soft foods and drinking with a straw.    Past Medical History:  Diagnosis Date  . Arthritis   . ASHD (arteriosclerotic heart disease)   . Atrial fib/flutter, transient    off coumadin since 11/2013- CHADSVAC of 2  . Cancer (HCC)    BASAL CELL   . Colon polyps    2003 hyperplastic and tubuler adenoma  . Depression   . Diverticulosis   . Dyslipidemia   . Dysrhythmia   . History of kidney stones   . Hypertension   . OSA (obstructive sleep apnea)    NO CPAP IN 10+ YEARS  . Pre-diabetes   . Rectus sheath hematoma 11/21/2013   Dr. Deatra Ina      Allergies  Allergen Reactions  . Gabapentin     Confusion   . Ciprofloxacin     dysphoria  . Novocain [Procaine Hcl]   . Prednisone     Nervousness    Current Outpatient Medications on File Prior to Visit  Medication Sig  . aspirin EC 81 MG tablet Take 81 mg by mouth daily.  Marland Kitchen azithromycin (ZITHROMAX) 250 MG tablet Take 2 tablets (500 mg) on  Day 1,  followed by 1 tablet (250 mg) once daily on Days  2 through 5.  . Calcium Carbonate Antacid (TUMS E-X PO) Take 2 tablets by mouth daily.  . Cholecalciferol 5000 UNITS TABS Take 1 tablet by mouth daily.  . citalopram (CELEXA) 40 MG tablet Take 1 tablet daily for Mood  . diltiazem (CARDIZEM) 60 MG tablet TAKE 1 TABLET BY MOUTH TWO  TIMES DAILY  . finasteride (PROSCAR) 5 MG tablet Take 1 tablet daily for Prostate  . LORazepam (ATIVAN) 2 MG tablet Take 1/2-1 tablet at Bedtime ONLY if needed &  limit to 5 days /week to avoid addiction  .  Magnesium 200 MG TABS Take 1 tablet by mouth daily.  . Multiple Vitamin (MULTIVITAMIN) capsule Take 1 capsule by mouth daily.  . Omega-3 Fatty Acids (FISH OIL PO) Take 1 capsule by mouth daily.   . vitamin C (ASCORBIC ACID) 500 MG tablet Take 1,000 mg by mouth daily.   No current facility-administered medications on file prior to visit.     ROS: Review of Systems  Constitutional: Negative for chills, diaphoresis, fever, malaise/fatigue and weight loss.  HENT: Negative for congestion, ear discharge, ear pain, hearing loss, nosebleeds, sinus pain, sore throat and tinnitus.        Soreness to gums and sore under tongue.  Eyes: Negative for blurred vision, double vision, photophobia, pain, discharge and redness.  Respiratory: Negative for cough, hemoptysis, sputum production, shortness of breath, wheezing and stridor.   Cardiovascular: Negative for chest pain, palpitations, orthopnea, claudication, leg swelling and PND.  Gastrointestinal: Negative for abdominal pain, blood in stool, constipation, diarrhea, heartburn, melena, nausea and vomiting.       Abdominal bloating  Skin: Negative for itching and rash.    Physical Exam:  There were no vitals taken for this visit.  General Appearance: Well nourished, in no apparent distress. Eyes: PERRLA, EOMs, conjunctiva no swelling or erythema Sinuses: No Frontal/maxillary tenderness ENT/Mouth: Ext aud canals clear, TMs without erythema, bulging. No erythema, swelling, or exudate on post pharynx.  Tonsils not swollen or erythematous. Hearing normal.  Erythema noted to tip of tongue extending 2cm with yellow central ulceration. Gingival enlargement upper and lower. Neck: Supple, thyroid normal.  Respiratory: Respiratory effort normal, BS equal bilaterally without rales, rhonchi, wheezing or stridor.  Cardio: RRR with no MRGs. Brisk peripheral pulses without edema.  Abdomen: Soft, + BS.  Non tender, no guarding, rebound, hernias, masses. Lymphatics:  Submandibular tenderness and lymphadenopathy.  Remaining lymph non tender without lymphadenopathy.  Musculoskeletal: Full ROM, 5/5 strength, normal gait.  Skin: Warm, dry without rashes, lesions, ecchymosis. Pupura noted bilateral arms. Neuro: Cranial nerves intact. Normal muscle tone, no cerebellar symptoms. Sensation intact.  Psych: Awake and oriented X 3, normal affect, Insight and Judgment appropriate.     Garnet Sierras, NP 10:35 PM [facility]

## 2018-12-23 ENCOUNTER — Ambulatory Visit (INDEPENDENT_AMBULATORY_CARE_PROVIDER_SITE_OTHER): Payer: Medicare Other | Admitting: Adult Health Nurse Practitioner

## 2018-12-23 ENCOUNTER — Encounter: Payer: Self-pay | Admitting: Adult Health Nurse Practitioner

## 2018-12-23 VITALS — BP 132/82 | HR 69 | Temp 97.5°F | Ht 69.5 in | Wt 201.0 lb

## 2018-12-23 DIAGNOSIS — K148 Other diseases of tongue: Secondary | ICD-10-CM | POA: Insufficient documentation

## 2018-12-23 DIAGNOSIS — K140 Glossitis: Secondary | ICD-10-CM

## 2018-12-23 DIAGNOSIS — K061 Gingival enlargement: Secondary | ICD-10-CM | POA: Diagnosis not present

## 2018-12-23 DIAGNOSIS — D692 Other nonthrombocytopenic purpura: Secondary | ICD-10-CM

## 2018-12-23 DIAGNOSIS — M26623 Arthralgia of bilateral temporomandibular joint: Secondary | ICD-10-CM | POA: Diagnosis not present

## 2018-12-23 MED ORDER — FIRST-DUKES MOUTHWASH MT SUSP: OROMUCOSAL | 1 refills | Status: DC

## 2018-12-23 MED ORDER — CEPHALEXIN 500 MG PO CAPS: ORAL_CAPSULE | ORAL | 1 refills | Status: DC

## 2018-12-23 MED ORDER — DEXAMETHASONE SODIUM PHOSPHATE 10 MG/ML IJ SOLN
10.0000 mg | Freq: Once | INTRAMUSCULAR | Status: AC
  Administered 2018-12-23: 10 mg via INTRAMUSCULAR

## 2018-12-23 NOTE — Patient Instructions (Addendum)
We have sent in two prescriptions for you.  Magic Mouth wash, may use every two hours swish for 1 min and spit.   You may also mix half bottle of mouth wash from pharmacy with peroxide.  Swish this in your mouth for 1-3 min. Every morning.    We have sent in Keflex for you to take twice a day for 10 days.    Oral Ulcers Oral ulcers are small sores inside the mouth or near the mouth. They may occur on or inside the lips, inside the cheeks, on the tongue, or anywhere else inside or near the mouth. They may be called canker sores or cold sores, which are two types of oral ulcers. Many oral ulcers are harmless and go away on their own. In some cases, oral ulcers may require medical care to determine the cause and proper treatment. What are the causes? Common causes of this condition include:  Infections caused by viruses, bacteria, or fungi.  Emotional stress.  Foods or chemicals that irritate the mouth.  Injury or physical irritation of the mouth.  Medicines.  Allergies.  Tobacco use. Less common causes include:  Skin disease.  A type of herpes virus infection (herpes simplexor herpes zoster).  Oral cancer. In some cases, the cause may not be known. What increases the risk? You are more likely to develop this condition if:  You wear dental braces, dentures, or retainers.  You have poor oral hygiene.  You have sensitive skin.  You have a condition that affects the entire body (systemic condition), such as an immune disorder. What are the signs or symptoms? The main symptom of this condition is having one or more oval-shaped or round ulcers that have red borders. Symptoms may vary depending on the cause. This includes:  Location of the ulcers. Ulcers may be found inside the mouth, on the gums, or on the insides of the lips or cheeks. They may also be found on the lips or on skin that is near the mouth, such as the cheeks or chin.  Pain. Ulcers can be painful and  uncomfortable, or they can be painless.  Appearance of the ulcers. They may look like red blisters and be filled with fluid, or they may be white or yellow patches.  Frequency of outbreaks. Ulcers may go away permanently after one outbreak, or they may come back (recur) often or rarely. How is this diagnosed? This condition is diagnosed with a physical exam. Your health care provider may ask you questions about your lifestyle and your medical history. You may have tests, including:  Blood tests.  Removal of a small number of cells from an ulcer to be examined under a microscope (biopsy). How is this treated? Treatment depends on the severity and cause of the condition. Oral ulcers often go away on their own in 1-2 weeks. Treatment may include medicines, such as:  Medicines to treat a viral infection (antivirals), a bacterial infection (antibiotics), or a fungal infection (antifungals).  Medicines to help control pain. This may include: ? Over-the-counter pain medicines. ? Gel, cream, or spray to numb the area (topical anesthetic) if you have severe pain. ? Other medicines to coat or numb your mouth. Follow these instructions at home: Medicines  Take or use over-the-counter and prescription medicines only as told by your health care provider.  If you were prescribed an antibiotic medicine, take it as told by your health care provider. Do not stop taking the antibiotic even if you start to  feel better.  Do not use products that contain benzocaine (including numbing gels) to treat teething or mouth pain in children who are younger than 2 years. These products may cause a rare but serious blood condition. Eating and drinking  Eat a balanced diet. Do not eat: ? Spicy foods. ? Citrus, such as oranges. ? Other foods and drinks that you think may cause or irritate your ulcers.  Drink enough fluid to keep your urine pale yellow.  Avoid drinking alcohol. Lifestyle   Practice good oral  hygiene: ? Gently brush your teeth with a soft toothbrush two times a day. ? Floss your teeth every day. ? Get regular dental cleanings and checkups.  Do not use any products that contain nicotine or tobacco, such as cigarettes and e-cigarettes. If you need help quitting, ask your health care provider. Managing pain  If directed, put ice on your face in the affected area to help reduce pain. ? Put ice in a plastic bag. ? Place a towel between your skin and the bag. ? Leave the ice on for 20 minutes, 2-3 times a day.  Avoid physical or chemical irritants that may have caused the ulcers or made them worse, such as mouthwashes that contain alcohol (ethanol). If you wear dental braces, dentures, or retainers, work with your health care provider to make sure these devices are fitted correctly.  If you were prescribed a prescription mouthwash to help reduce pain in your mouth, use it as told by your health care provider. General instructions  Rinse with a salt-water mixture 3-4 times a day or as told by your health care provider. To make a salt-water mixture, completely dissolve -1 tsp (3-6 g) of salt in 1 cup (237 mL) of warm water.  Keep all follow-up visits as told by your health care provider. This is important. Contact a health care provider if:  You have: ? Pain that gets worse or does not get better with medicine. ? Four or more ulcers at one time. ? A fever. ? New ulcers that look or feel different from other ulcers you have. ? Inflammation in one eye or both eyes. ? Ulcers that do not go away after 10 days.  You develop new symptoms in your mouth, such as: ? Bleeding or crusting around your lips or gums. ? Tooth pain. ? Difficulty swallowing.  You develop symptoms on your skin or genitals, such as: ? A rash or blisters. ? Burning or itching sensations.  Your ulcers begin or get worse after you start a new medicine. Get help right away if you have:  Difficulty  breathing.  Swelling in your face or neck.  Excessive bleeding from your mouth.  Severe pain. Summary  Oral ulcers may occur anywhere inside or near the mouth.  They can be caused by many things, such as infections, stress, injury or irritation, or tobacco use.  Oral ulcers can be painful or painless.  Treatment may include medicines to relieve pain or to treat an infection (if appropriate).  Most oral ulcers go away in 1-2 weeks. This information is not intended to replace advice given to you by your health care provider. Make sure you discuss any questions you have with your health care provider. Document Released: 11/08/2004 Document Revised: 02/13/2018 Document Reviewed: 02/13/2018 Elsevier Interactive Patient Education  2019 [employer].

## 2018-12-31 ENCOUNTER — Telehealth: Payer: Self-pay | Admitting: Cardiovascular Disease

## 2018-12-31 NOTE — Telephone Encounter (Signed)

## 2019-01-02 ENCOUNTER — Ambulatory Visit: Payer: Self-pay | Admitting: Cardiovascular Disease

## 2019-01-15 NOTE — Telephone Encounter (Signed)
Spoke with patient and his wife. He is doing well and would prefer to wait and be seen in person once social distancing guidelines have been lifted. He will call back if he has any concerns.

## 2019-01-22 NOTE — Telephone Encounter (Signed)
Recall placed for august  

## 2019-01-27 ENCOUNTER — Other Ambulatory Visit: Payer: Self-pay

## 2019-01-27 ENCOUNTER — Encounter: Payer: Self-pay | Admitting: Adult Health Nurse Practitioner

## 2019-01-27 ENCOUNTER — Telehealth: Payer: Medicare Other | Admitting: Adult Health Nurse Practitioner

## 2019-01-27 VITALS — BP 124/67 | HR 75 | Temp 98.7°F | Wt 200.0 lb

## 2019-01-27 DIAGNOSIS — B370 Candidal stomatitis: Secondary | ICD-10-CM | POA: Diagnosis not present

## 2019-01-27 MED ORDER — FLUCONAZOLE 150 MG PO TABS: ORAL_TABLET | ORAL | 0 refills | Status: DC

## 2019-01-27 NOTE — Progress Notes (Signed)
PATIENT REPORTS-MTHS-mouth sores Went away but came back after finishing ABX. Some mouth pain today-8

## 2019-01-27 NOTE — Progress Notes (Signed)
THIS ENCOUNTER IS A VIRTUAL VISIT DUE TO COVID-19 - PATIENT WAS NOT SEEN IN THE OFFICE.  PATIENT HAS CONSENTED TO VIRTUAL VISIT / TELEMEDICINE VISIT   Virtual Visit via Telephone Note  I connected with [name] Yu on 01/29/19 at  1:00 PM EDT by telephone and verified that I am speaking with the correct person using two identifiers.   I discussed the limitations, risks, security and privacy concerns of performing an evaluation and management service by telephone and the availability of in person appointments. I also discussed with the patient that there may be a patient responsible charge related to this service. The patient expressed understanding and agreed to proceed.    Assessment and Plan:  Diagnoses and all orders for this visit:  Candida infection of mouth -     fluconazole (DIFLUCAN) 150 MG tablet; Take one tablet by mouth at onset of yeast symptoms.  Take second tablet on day three if symptoms persist. -Rx Duke's Mouth wash Q2 hours. Faxed to [facility] [phone]. Discussed oral hygiene Continue soft foods Continue to monitor symptoms Contact office if no improvement or worsening symptoms      Follow Up Instructions:    I discussed the assessment and treatment plan with the patient. The patient was provided an opportunity to ask questions and all were answered. The patient agreed with the plan and demonstrated an understanding of the instructions.   The patient was advised to call back or seek an in-person evaluation if the symptoms worsen or if the condition fails to improve as anticipated.  I provided 15 minutes of non-face-to-face time during this encounter including counseling, chart review, and critical decision making was performed.   Future Appointments  Date Time Provider [place]  02/23/2019 11:15 AM [name], NP [facility] None  06/08/2019 10:30 AM [name], MD [facility] None  12/16/2019 10:00 AM [name], MD  [facility] None    ------------------------------------------------------------------------------------------------------------------   HPI 85 y.o.male presents for evaluation for acute mout sores.  Reports that this has been going on since last visit 12/23/18.  Reports he has sore to inside of his lower lip mucosa as well as right cheek area.  Reports ulcerations minimally heal and appear on different areas of his mouth. Reports his mouth is sore and there is a white coating on his tongue.  His wive is present on the phone and communicating for the patient  He reports it is difficult for him to eat solid foods and he is not been able to open his jaw very wide as this is also sore.  He denies any teeth grinding at night and wife has not observed this.  They have been using Respinole, Peroxyl mouth wash three times a day.  He has been eating soft food because it aggravates his who mouth.   He was unable to get the Dukes mouth wash prescribed related to the corona virus on no compounding at [facility] at this time.  Patient or family did not notify office of this.  So treatment was not completed from previous visit.  He denies headache, change in vision, chest pain, shortness of breath, dizziness, increased sinus symptoms, any known dental caries, tenderness to scalp or temples.  Past Medical History:  Diagnosis Date  . Arthritis   . ASHD (arteriosclerotic heart disease)   . Atrial fib/flutter, transient    off coumadin since 11/2013- CHADSVAC of 2  . Cancer (HCC)    BASAL CELL   . Colon polyps  2003 hyperplastic and tubuler adenoma  . Depression   . Diverticulosis   . Dyslipidemia   . Dysrhythmia   . History of kidney stones   . Hypertension   . OSA (obstructive sleep apnea)    NO CPAP IN 10+ YEARS  . Pre-diabetes   . Rectus sheath hematoma 11/21/2013   Dr. Deatra Ina      Allergies  Allergen Reactions  . Gabapentin     Confusion   . Ciprofloxacin     dysphoria  . Novocain [Procaine  Hcl]   . Prednisone     Nervousness    Current Outpatient Medications on File Prior to Visit  Medication Sig  . aspirin EC 81 MG tablet Take 81 mg by mouth daily.  . Calcium Carbonate Antacid (TUMS E-X PO) Take 2 tablets by mouth daily.  . Cholecalciferol 5000 UNITS TABS Take 1 tablet by mouth daily.  . citalopram (CELEXA) 40 MG tablet Take 1 tablet daily for Mood  . diltiazem (CARDIZEM) 60 MG tablet TAKE 1 TABLET BY MOUTH TWO  TIMES DAILY  . Diphenhyd-Hydrocort-Nystatin (FIRST-DUKES MOUTHWASH) SUSP 1 tsp swish and swallow q 2 hours as needed  . finasteride (PROSCAR) 5 MG tablet Take 1 tablet daily for Prostate  . LORazepam (ATIVAN) 2 MG tablet Take 1/2-1 tablet at Bedtime ONLY if needed &  limit to 5 days /week to avoid addiction  . Magnesium 200 MG TABS Take 1 tablet by mouth daily.  . Multiple Vitamin (MULTIVITAMIN) capsule Take 1 capsule by mouth daily.  . Omega-3 Fatty Acids (FISH OIL PO) Take 1 capsule by mouth daily.   . vitamin C (ASCORBIC ACID) 500 MG tablet Take 1,000 mg by mouth daily.   No current facility-administered medications on file prior to visit.     ROS: all negative except above.    Observations/Objective:  BP 124/67   Pulse 75   Temp 98.7 F (37.1 C)   Wt 200 lb (90.7 kg)   SpO2 95%   BMI 29.11 kg/m  Vitals obtained by patient with home equipment.  General : Well sounding patient in no apparent distress HEENT: no hoarseness, no cough for duration of visit Lungs: speaks in complete sentences, no audible wheezing, no apparent distress Neurological: alert, oriented x 3 Psychiatric: pleasant, judgement appropriate    Garnet Sierras, NP 2:06 PM [facility]

## 2019-01-29 MED ORDER — LIDOCAINE VISCOUS HCL 2 % MT SOLN: 15.0000 mL | OROMUCOSAL | 0 refills | Status: DC | PRN

## 2019-01-29 NOTE — Patient Instructions (Signed)
Today you had a video visit with Garnet Sierras, DNP on 01/27/19.  Below is a summary of your visit.   Oral candidia / Oral ulcers:  Use Duke's mouth rinse as directed.  We have faxed the prescription to [facility].  A prescription for fluconazole has been sent to [facility].  We have also sent in an Oral Lidocaine liquid for mouth pain.  This a numbing medication. Swish in mouth and spit out.  If you have throat soreness you may gargle and swallow the liquid.  Continue to eat soft foods and increase your water intake. Monitor symptoms and contact office with new or worsening symptoms.  You may continue the peroxyl mouth rinse as well.   May contact office via phone [phone] or [place].    Below is some general information :   Coronavirus (COVID-19) Are you at risk?  Are you at risk for the Coronavirus (COVID-19)?  To be considered HIGH RISK for Coronavirus (COVID-19), you have to meet the following criteria:  . Traveled to [place], [place], [place], [place] or [place]; or in the [place] to [place], [place], [place], or [place]; and have fever, cough, and shortness of breath within the last 2 weeks of travel OR . Been in close contact with a person diagnosed with COVID-19 within the last 2 weeks and have fever, cough, and shortness of breath . IF YOU DO NOT MEET THESE CRITERIA, YOU ARE CONSIDERED LOW RISK FOR COVID-19.  What to do if you are HIGH RISK for COVID-19?  Marland Kitchen If you are having a medical emergency, call 911. . Seek medical care right away. Before you go to a doctor's office, urgent care or emergency department, call ahead and tell them about your recent travel, contact with someone diagnosed with COVID-19, and your symptoms. You should receive instructions from your physician's office regarding next steps of care.  . When you arrive at healthcare provider, tell the healthcare staff immediately you have returned from visiting [place], [place], [place], [place]  or [place]; or traveled in the [place] to [place], [place], [place], or [place]; in the last two weeks or you have been in close contact with a person diagnosed with COVID-19 in the last 2 weeks.   . Tell the health care staff about your symptoms: fever, cough and shortness of breath. . After you have been seen by a medical provider, you will be either: o Tested for (COVID-19) and discharged home on quarantine except to seek medical care if symptoms worsen, and asked to  - Stay home and avoid contact with others until you get your results (4-5 days)  - Avoid travel on public transportation if possible (such as bus, train, or airplane) or o Sent to the Emergency Department by EMS for evaluation, COVID-19 testing, and possible admission depending on your condition and test results.  What to do if you are LOW RISK for COVID-19?  Reduce your risk of any infection by using the same precautions used for avoiding the common cold or flu:  Marland Kitchen Wash your hands often with soap and warm water for at least 20 seconds.  If soap and water are not readily available, use an alcohol-based hand sanitizer with at least 60% alcohol.  . If coughing or sneezing, cover your mouth and nose by coughing or sneezing into the elbow areas of your shirt or coat, into a tissue or into your sleeve (not your hands). . Avoid shaking hands with others  and consider head nods or verbal greetings only. . Avoid touching your eyes, nose, or mouth with unwashed hands.  . Avoid close contact with people who are sick. . Avoid places or events with large numbers of people in one location, like concerts or sporting events. . Carefully consider travel plans you have or are making. . If you are planning any travel outside or inside the [place], visit the CDC's Travelers' Health webpage for the latest health notices. . If you have some symptoms but not all symptoms, continue to monitor at home and seek medical attention if your  symptoms worsen. . If you are having a medical emergency, call 911.   [place] / e-Visit: [email or web address]         [facility]: [facility]: [phone]                   [facility]: [phone]

## 2019-01-31 ENCOUNTER — Other Ambulatory Visit: Payer: Self-pay | Admitting: Adult Health Nurse Practitioner

## 2019-01-31 DIAGNOSIS — B370 Candidal stomatitis: Secondary | ICD-10-CM

## 2019-02-01 MED ORDER — LIDOCAINE VISCOUS HCL 2 % MT SOLN: 15.0000 mL | OROMUCOSAL | 0 refills | Status: DC | PRN

## 2019-02-03 ENCOUNTER — Telehealth: Payer: Self-pay

## 2019-02-03 ENCOUNTER — Other Ambulatory Visit: Payer: Self-pay | Admitting: Adult Health Nurse Practitioner

## 2019-02-03 DIAGNOSIS — K061 Gingival enlargement: Secondary | ICD-10-CM

## 2019-02-03 DIAGNOSIS — A691 Other Vincent's infections: Secondary | ICD-10-CM

## 2019-02-03 DIAGNOSIS — B379 Candidiasis, unspecified: Secondary | ICD-10-CM

## 2019-02-03 MED ORDER — FLUCONAZOLE 150 MG PO TABS: ORAL_TABLET | ORAL | 0 refills | Status: DC

## 2019-02-03 MED ORDER — CEPHALEXIN 500 MG PO CAPS: 500.0000 mg | ORAL_CAPSULE | Freq: Two times a day (BID) | ORAL | 0 refills | Status: AC

## 2019-02-03 NOTE — Telephone Encounter (Signed)
Spoke with patient & relayed all this information to Mrs. [name] Yu......(Caregiver)  After speaking with [name] & Dr. Crissie Sickles    (From the continued symptoms and images it looks like [place] Angina. This is an oral infection. Please let patient know, we have sent in Keflex 500mg  to take twice a day for 10 days. This is an antibiotic. Also Fluconazole to take once a day for 14 days.   Use peroxide mouth wash from any pharmacy. Use this every hour while awake. Swish in mouth for 30seconds or longer then spit out.   Continue nystatin mouth wash, rinse and spit.  Continue lidocaine mouth rinse for pain. Please let us know if he needs a refill. I will follow up on Friday. )   Mrs. [name] Yu voiced understanding & requested this information be sent to [name] MyChart acct. Which was done at the time of the call.

## 2019-02-12 ENCOUNTER — Other Ambulatory Visit: Payer: Self-pay | Admitting: Physician Assistant

## 2019-02-19 ENCOUNTER — Encounter: Payer: Self-pay | Admitting: Adult Health

## 2019-02-19 NOTE — Progress Notes (Deleted)
MEDICARE ANNUAL WELLNESS VISIT AND FOLLOW UP Assessment:   Diagnoses and all orders for this visit:  Encounter for Medicare annual wellness exam  Essential hypertension Continue medications Monitor blood pressure at home; call if consistently over 130/80 Continue DASH diet.   Reminder to go to the ER if any CP, SOB, nausea, dizziness, severe HA, changes vision/speech, left arm numbness and tingling and jaw pain.  Coronary artery disease due to lipid rich plaque Control blood pressure, cholesterol, glucose, increase exercise.  Followed by Dr. Rockey Situ  Paroxysmal atrial fibrillation ([place]) Rate controlled today; continue ASA Followed by Dr. Rockey Situ  ASHD (arteriosclerotic heart disease) Control blood pressure, cholesterol, glucose, increase exercise.  Followed by Dr. Rockey Situ  OSA (obstructive sleep apnea) Continue with CPAP  Diverticulosis Add soluble fiber   Vitamin D deficiency At goal at recent check; continue to recommend supplementation for goal of 70-100 Defer vitamin D level  Other abnormal glucose Recent A1Cs at goal Discussed diet/exercise, weight management  Defer A1C; check BMP  Personal history of colonic polyps No more colonoscopies due to age  Anxiety Well managed by current regimen; continue medications- reminded risks of benzos and to limit use Stress management techniques discussed, increase water, good sleep hygiene discussed, increase exercise, and increase veggies.   Mixed hyperlipidemia Continue low cholesterol diet and exercise.  Check lipid panel.   Medication management CBC, CMP/GFR  Depression, major, in remission ([place]) Continue medications  Lifestyle discussed: diet/exerise, sleep hygiene, stress management, hydration  BMI 26.0-26.9,adult Continue to monitor  Benign prostatic hyperplasia, unspecified whether lower urinary tract symptoms present Continue with medications  Gingival enlargement/erythema of tongue ***  Over 30 minutes  of exam, counseling, chart review, and critical decision making was performed  Future Appointments  Date Time Provider [place]  02/23/2019 11:15 AM [name], NP [facility] None  06/08/2019 10:30 AM [name], MD [facility] None  12/16/2019 10:00 AM [name], MD [facility] None     Plan:   During the course of the visit the patient was educated and counseled about appropriate screening and preventive services including:    Pneumococcal vaccine   Influenza vaccine  Prevnar 13  Td vaccine  Screening electrocardiogram  Colorectal cancer screening  Diabetes screening  Glaucoma screening  Nutrition counseling    Subjective:  [name] Yu is a 85 y.o. male who presents for Medicare Annual Wellness Visit and 3 month follow up for HTN, hyperlipidemia, glucose managment, and vitamin D Def.    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 Heart cath in 2003 was Negative. Cardiolite in 2007 was also Negative.   he has a diagnosis of depression/anxiety and is currently on celexa 40 mg daily and lorazepan 1-2 mg PRN anxiety, reports symptoms are well controlled on current regimen. he currently takes 0.5 mg 5/7 nights of the week.   BMI is There is no height or weight on file to calculate BMI., he has not been working on diet and exercise. Wt Readings from Last 3 Encounters:  01/27/19 200 lb (90.7 kg)  12/23/18 201 lb (91.2 kg)  11/21/18 196 lb 9.6 oz (89.2 kg)  His blood pressure has been controlled at home, today their BP is   He does not workout. He denies chest pain, shortness of breath,  dizziness.   He is not on cholesterol medication due to age. His cholesterol is not at goal. The cholesterol last visit was:   Lab Results  Component Value Date   CHOL 231 (H) 11/21/2018   HDL 59 11/21/2018   LDLCALC 140 (H) 11/21/2018   TRIG 182 (H) 11/21/2018   CHOLHDL 3.9 11/21/2018   He has been working on diet and exercise for glucose management, and denies foot ulcerations, increased appetite, nausea, paresthesia of the feet, polydipsia, polyuria, visual disturbances, vomiting and weight loss. Last A1C in the office was:  Lab Results  Component Value Date   HGBA1C 5.7 (H) 11/21/2018   Last GFR Lab Results  Component Value Date   GFRNONAA 76 11/21/2018    Patient is on Vitamin D supplement and near goal of 60 at recent check:    Lab Results  Component Value Date   VD25OH 58 11/21/2018      Medication Review:    Current Outpatient Medications (Cardiovascular):  .  diltiazem (CARDIZEM) 60 MG tablet, TAKE 1 TABLET BY MOUTH TWO  TIMES DAILY   Current Outpatient Medications (Analgesics):  .  aspirin EC 81 MG tablet, Take 81 mg by mouth daily.   Current Outpatient Medications (Other):  Marland Kitchen  Calcium Carbonate Antacid (TUMS E-X PO), Take 2 tablets by mouth daily. .  Cholecalciferol 5000 UNITS TABS, Take 1 tablet by mouth daily. .  citalopram (CELEXA) 40 MG tablet, Take 1 tablet daily for Mood .  Diphenhyd-Hydrocort-Nystatin (FIRST-DUKES MOUTHWASH) SUSP, 1 tsp swish and swallow q 2 hours as needed .  finasteride (PROSCAR) 5 MG tablet, Take 1 tablet daily for Prostate .  fluconazole (DIFLUCAN) 150 MG tablet, Take one tablet by mouth at onset of yeast symptoms.  Take second tablet on day three if symptoms persist. .  fluconazole (DIFLUCAN) 150 MG tablet, Take one tablet by mouth at onset of yeast symptoms.  Take second tablet on day three. .  lidocaine (XYLOCAINE) 2 % solution, USE AS DIRECTED 15ML IN THE MOUTH OR THROAT AS NEEDED FOR MOUTH PAIN .  LORazepam (ATIVAN) 2 MG  tablet, Take 1/2-1 tablet at Bedtime ONLY if needed &  limit to 5 days /week to avoid addiction .  Magnesium 200 MG TABS, Take 1 tablet by mouth daily. .  Multiple Vitamin (MULTIVITAMIN) capsule, Take 1 capsule by mouth daily. .  Omega-3 Fatty Acids (FISH OIL PO), Take 1 capsule by mouth daily.  .  vitamin C (ASCORBIC ACID) 500 MG tablet, Take 1,000 mg by mouth daily.  Allergies: Allergies  Allergen Reactions  . Gabapentin     Confusion   . Ciprofloxacin     dysphoria  . Novocain [Procaine Hcl]   . Prednisone     Nervousness    Current Problems (verified) has Essential hypertension; Abnormal glucose (prediabetes); Hyperlipidemia, mixed; OSA (obstructive sleep apnea); Atrial fibrillation ([place]); Personal history of colonic polyps; ASHD (arteriosclerotic heart disease); Depression, major, in remission ([place]); Diverticulosis; Medication management; Vitamin D deficiency; Encounter for Medicare annual wellness exam; BMI 26.0-26.9,adult; CAD (coronary artery disease); Anxiety; BPH (benign prostatic hyperplasia); Postoperative urinary retention; Status post total replacement of right hip; FHx: heart disease; Bilateral temporomandibular joint pain; Gingival enlargement; and Erythema of tongue on their problem list.  Screening Tests Immunization History  Administered Date(s) Administered  . DT 07/06/2014  . Influenza Split 07/20/2014,  07/18/2016  . Influenza, High Dose Seasonal PF 08/22/2015, 07/15/2017  . Pneumococcal Conjugate-13 07/06/2014  . Pneumococcal Polysaccharide-23 06/09/2013  . Td 12/15/2016  . Zoster 10/15/2005    Preventative care: Last colonoscopy: 2003, done due to age  Prior vaccinations: TD or Tdap: 2018  Influenza: 2018 ***  Pneumococcal: 2014 Prevnar13: 2015 Shingles/Zostavax: 2007  Names of Other Physician/Practitioners you currently use: 1. [facility] 3.  Dr Olena Heckle, dentist, last visit 2017   Patient Care Team: [name], MD as PCP - General (Internal Medicine) Benito Mccreedy, MD as Referring Physician (Family Medicine) Minna Merritts, MD as Consulting Physician (Cardiology) Inda Castle, MD (Inactive) as Consulting Physician (Gastroenterology)  Surgical: He  has a past surgical history that includes Hernia repair; PROSTATE; and Total hip arthroplasty (Right, 03/07/2018). Family His family history includes Alzheimer's disease in his mother; Breast cancer in his daughter and sister; Diabetes in his brother; Heart disease in his brother and sister; Kidney cancer in his father; Rectal cancer in his sister; Ulcerative colitis in his sister. Social history  He reports that he quit smoking about 25 years ago. He has never used smokeless tobacco. He reports that he does not drink alcohol or use drugs.  MEDICARE WELLNESS OBJECTIVES: Physical activity:   Cardiac risk factors:   Depression/mood screen:   Depression screen [facility] 2/9 11/20/2018  Decreased Interest 0  Down, Depressed, Hopeless 0  PHQ - 2 Score 0    ADLs:  In your present state of health, do you have any difficulty performing the following activities: 11/20/2018 08/11/2018  Hearing? N N  Comment - -  Vision? N N  Difficulty concentrating or making decisions? N N  Walking or climbing stairs? N N  Comment - -  Dressing or bathing? N N  Comment - -  Doing errands, shopping? N [place] and eating ? - -  Using the Toilet? - -  In the past six months, have you accidently leaked urine? - -  Do you have problems with loss of bowel control? - -  Comment - -  Managing your Medications? - -  Managing your Finances? - -  Housekeeping or managing your Housekeeping? - -  Comment - -  Some recent data might be hidden     Cognitive Testing  Alert? Yes  Normal Appearance?Yes  Oriented to person? Yes  Place? Yes   Time? Yes  Recall of three objects?   Yes  Can perform simple calculations? Yes  Displays appropriate judgment?Yes  Can read the correct time from a watch face?Yes  EOL planning:     Objective:   There were no vitals filed for this visit. There is no height or weight on file to calculate BMI.  General appearance: alert, no distress, WD/WN, male HEENT: normocephalic, sclerae anicteric, TMs pearly, nares patent, no discharge or erythema, pharynx normal Oral cavity: MMM, no lesions Neck: supple, no lymphadenopathy, no thyromegaly, no masses Heart: RRR, normal S1, S2, no murmurs Lungs: CTA bilaterally, no wheezes, rhonchi, or rales Abdomen: +bs, soft, non tender, non distended, no masses, no hepatomegaly, no splenomegaly Musculoskeletal: nontender, no swelling, no obvious deformity Extremities: no edema, no cyanosis, no clubbing Pulses: 1+ symmetric, upper and lower extremities, normal cap refill Neurological: alert, oriented x 3, CN2-12 intact, strength normal upper extremities and lower extremities, sensation normal throughout, gait slow, antalgic Psychiatric: normal affect,  behavior normal, pleasant   Medicare Attestation I have personally reviewed: The patient's medical and social history Their use of alcohol, tobacco or illicit drugs Their current medications and supplements The patient's functional ability including ADLs,fall risks, home safety risks, cognitive, and hearing and visual impairment Diet and physical activities Evidence for depression or mood disorders  The patient's weight, height, BMI, and visual acuity have been recorded in the chart.  I have made referrals, counseling, and provided education to the patient based on review of the above and I have provided the patient with a written personalized care plan for preventive services.     Izora Ribas, NP   02/19/2019

## 2019-02-23 ENCOUNTER — Ambulatory Visit: Payer: Self-pay | Admitting: Adult Health

## 2019-02-23 ENCOUNTER — Other Ambulatory Visit: Payer: Self-pay | Admitting: Cardiovascular Disease

## 2019-02-23 ENCOUNTER — Other Ambulatory Visit: Payer: Self-pay | Admitting: Internal Medicine

## 2019-02-23 ENCOUNTER — Other Ambulatory Visit: Payer: Self-pay | Admitting: Physician Assistant

## 2019-02-23 DIAGNOSIS — F411 Generalized anxiety disorder: Secondary | ICD-10-CM

## 2019-02-23 MED ORDER — LORAZEPAM 2 MG PO TABS: ORAL_TABLET | ORAL | 1 refills | Status: DC

## 2019-02-24 ENCOUNTER — Telehealth: Payer: Self-pay

## 2019-02-24 ENCOUNTER — Other Ambulatory Visit: Payer: Self-pay | Admitting: Adult Health Nurse Practitioner

## 2019-02-24 DIAGNOSIS — F411 Generalized anxiety disorder: Secondary | ICD-10-CM

## 2019-02-24 MED ORDER — LORAZEPAM 2 MG PO TABS: ORAL_TABLET | ORAL | 1 refills | Status: DC

## 2019-02-24 NOTE — Telephone Encounter (Signed)
Wife was made aware of Med Rx that was sent to pharmacy  Mrs. Marik states that Mr. [place] mouth has gotten much better.

## 2019-02-24 NOTE — Telephone Encounter (Signed)
-----   Message from Garnet Sierras, NP sent at 02/24/2019  4:04 PM EDT ----- Regarding: RE: med refill - complete Contact: [phone] Sent Rx to the [facility] mail order per request.  Thanks, Danton Sewer ----- Message ----- From: Elenor Quinones, CMA Sent: 02/24/2019   2:15 PM EDT To: Garnet Sierras, NP Subject: med refill                                     Collie Siad patient's wife reports   Rx was sent to the wrong pharmacy.  Should have been sent to [facility] mail order pharmacy.   LORAZEPAM  Please & thank you

## 2019-02-25 ENCOUNTER — Encounter: Payer: Self-pay | Admitting: Adult Health

## 2019-02-25 NOTE — Progress Notes (Signed)
MEDICARE ANNUAL WELLNESS VISIT AND FOLLOW UP Assessment:   Diagnoses and all orders for this visit:  Encounter for Medicare annual wellness exam  Essential hypertension Continue medications Monitor blood pressure at home; call if consistently over 130/80 Continue DASH diet.   Reminder to go to the ER if any CP, SOB, nausea, dizziness, severe HA, changes vision/speech, left arm numbness and tingling and jaw pain.  Coronary artery disease due to lipid rich plaque Control blood pressure, cholesterol, glucose, increase exercise.  Followed by Dr. Rockey Situ  Paroxysmal atrial fibrillation ([place]) Rate controlled today; continue ASA Followed by Dr. Rockey Situ  ASHD (arteriosclerotic heart disease) Control blood pressure, cholesterol, glucose, increase exercise.  Followed by Dr. Rockey Situ  OSA (obstructive sleep apnea) Continue with CPAP  Diverticulosis Add soluble fiber   Vitamin D deficiency At goal at recent check; continue to recommend supplementation for goal of 70-100 Defer vitamin D level  Other abnormal glucose Recent A1Cs at goal Discussed diet/exercise, weight management  Defer A1C; check CMP  Personal history of colonic polyps No more colonoscopies due to age  Anxiety Well managed by current regimen; continue medications- reminded risks of benzos and to limit use Stress management techniques discussed, increase water, good sleep hygiene discussed, increase exercise, and increase veggies.   Mixed hyperlipidemia Continue low cholesterol diet and exercise.  Check lipid panel.   Medication management CBC, CMP/GFR  Depression, major, in remission ([place]) Continue medications  Lifestyle discussed: diet/exerise, sleep hygiene, stress management, hydration  Overweight (BMI 26-29) Continue to recommend diet heavy in fruits and veggies and low in animal meats, cheeses, and dairy products, appropriate calorie intake Discuss exercise recommendations routinely Continue to monitor  weight at each visit  Benign prostatic hyperplasia, unspecified whether lower urinary tract symptoms present Continue with medications  Arthritis of left hip Followed by ortho; using OTC analgesics for mild intermittent pain Encouraged continue exercise/activity  Gingival enlargement/erythema of tongue/thrush Significantly improved; nearly resolved; is following up with the [facility] for this Getting ? Autoimmune lab workup, biopsy was reportedly negative  Over 30 minutes of exam, counseling, chart review, and critical decision making was performed  Future Appointments  Date Time Provider [place]  06/08/2019 10:30 AM [name], MD [facility] None  12/16/2019 10:00 AM [name], MD [facility] None     Plan:   During the course of the visit the patient was educated and counseled about appropriate screening and preventive services including:    Pneumococcal vaccine   Influenza vaccine  Prevnar 13  Td vaccine  Screening electrocardiogram  Colorectal cancer screening  Diabetes screening  Glaucoma screening  Nutrition counseling    Subjective:  [name] Yu is a 85 y.o. male who presents for Medicare Annual Wellness Visit and 3 month follow up for HTN, hyperlipidemia, glucose managment, and vitamin D Def.    He has had persistent oral infection reported via phone and photo by family, had not been seen in office due to high risk status with covid 19, didn't respond to [facility]'s magic mouthwash, nystatin mouth wash, and was initiated on keflex 500 mg BID and oral diflucan course for possible Vincent's angina, which he reports didn't help. [facility] of mouth lesion which was negative, was apparently prescribed alternate "Duke's" combination which helped significantly and now nearly resolved.   Patient has hx/o pAfib since d/c'd consequent of a large rectus sheath hematoma & is only on LD bASA. Heart cath in 2003 was Negative. Cardiolite  in 2007 was  also Negative.   he has a diagnosis of depression/anxiety and is currently on celexa 40 mg daily and lorazepan 1-2 mg PRN anxiety, reports symptoms are well controlled on current regimen. he currently takes 0.5 mg 5/7 nights of the week.   BMI is Body mass index is 28.82 kg/m., he has not been working on diet and exercise. He is followed by ortho for hip pain limiting activity.  Wt Readings from Last 3 Encounters:  02/26/19 198 lb (89.8 kg)  01/27/19 200 lb (90.7 kg)  12/23/18 201 lb (91.2 kg)   His blood pressure has been controlled at home, today their BP is BP: 110/64 He does not workout. He denies chest pain, shortness of breath, dizziness.   He is not on cholesterol medication due to age. His cholesterol is not at goal. The cholesterol last visit was:   Lab Results  Component Value Date   CHOL 231 (H) 11/21/2018   HDL 59 11/21/2018   LDLCALC 140 (H) 11/21/2018   TRIG 182 (H) 11/21/2018   CHOLHDL 3.9 11/21/2018   He has been working on diet and exercise for glucose management, and denies foot ulcerations, increased appetite, nausea, paresthesia of the feet, polydipsia, polyuria, visual disturbances, vomiting and weight loss. Last A1C in the office was:  Lab Results  Component Value Date   HGBA1C 5.7 (H) 11/21/2018   Last GFR Lab Results  Component Value Date   GFRNONAA 76 11/21/2018    Patient is on Vitamin D supplement and near goal of 60 at recent check:    Lab Results  Component Value Date   VD25OH 58 11/21/2018      Medication Review:    Current Outpatient Medications (Cardiovascular):  .  diltiazem (CARDIZEM) 60 MG tablet, TAKE 1 TABLET BY MOUTH TWO  TIMES DAILY   Current Outpatient Medications (Analgesics):  .  aspirin EC 81 MG tablet, Take 81 mg by mouth daily.   Current Outpatient Medications (Other):  Marland Kitchen  Calcium Carbonate Antacid (TUMS E-X PO), Take 2 tablets by mouth daily. .  Cholecalciferol 5000 UNITS TABS, Take 1 tablet by mouth  daily. .  citalopram (CELEXA) 40 MG tablet, Take 1 tablet daily for Mood .  finasteride (PROSCAR) 5 MG tablet, Take 1 tablet daily for Prostate .  lidocaine (XYLOCAINE) 2 % solution, USE AS DIRECTED 15ML IN THE MOUTH OR THROAT AS NEEDED FOR MOUTH PAIN .  LORazepam (ATIVAN) 2 MG tablet, TAKE 1/2 TO 1 TABLET BY  MOUTH AT BEDTIME ONLY IF  NEEDED AND LIMIT TO 5  DAYS/WEEK TO AVOID  ADDICTION .  Magnesium 200 MG TABS, Take 1 tablet by mouth daily. .  Multiple Vitamin (MULTIVITAMIN) capsule, Take 1 capsule by mouth daily. .  Omega-3 Fatty Acids (FISH OIL PO), Take 1 capsule by mouth daily.  .  vitamin C (ASCORBIC ACID) 500 MG tablet, Take 1,000 mg by mouth daily. .  Diphenhyd-Hydrocort-Nystatin (FIRST-DUKES MOUTHWASH) SUSP, 1 tsp swish and swallow q 2 hours as needed .  fluconazole (DIFLUCAN) 150 MG tablet, Take one tablet by mouth at onset of yeast symptoms.  Take second tablet on day three if symptoms persist. .  fluconazole (DIFLUCAN) 150 MG tablet, Take one tablet by mouth at onset of yeast symptoms.  Take second tablet on day three.  Allergies: Allergies  Allergen Reactions  . Gabapentin     Confusion   . Ciprofloxacin     dysphoria  . Novocain [Procaine Hcl]   . Prednisone     Nervousness  Current Problems (verified) has Essential hypertension; Abnormal glucose (prediabetes); Hyperlipidemia, mixed; OSA (obstructive sleep apnea); Atrial fibrillation ([place]); Personal history of colonic polyps; ASHD (arteriosclerotic heart disease); Depression, major, in remission ([place]); Diverticulosis; Medication management; Vitamin D deficiency; Encounter for Medicare annual wellness exam; BMI 26.0-26.9,adult; CAD (coronary artery disease); Anxiety; BPH (benign prostatic hyperplasia); Bilateral temporomandibular joint pain; Gingival enlargement; and Erythema of tongue on their problem list.  Screening Tests Immunization History  Administered Date(s) Administered  . DT 07/06/2014  . Influenza Split  07/20/2014, 07/18/2016  . Influenza, High Dose Seasonal PF 08/22/2015, 07/15/2017  . Pneumococcal Conjugate-13 07/06/2014  . Pneumococcal Polysaccharide-23 06/09/2013  . Td 12/15/2016  . Zoster 10/15/2005    Preventative care: Last colonoscopy: 2003, done due to age  Prior vaccinations: TD or Tdap: 2018  Influenza: 2019 at [facility]  Pneumococcal: 2014 Prevnar13: 2015 Shingles/Zostavax: 2007  Names of Other Physician/Practitioners you currently use: 1. [facility], had cataracts removed 3. Dr. Payton Spark, [facility] dentistry, last visit 2020    Patient Care Team: [name], MD as PCP - General (Internal Medicine) Benito Mccreedy, MD as Referring Physician (Family Medicine) Minna Merritts, MD as Consulting Physician (Cardiology) Inda Castle, MD (Inactive) as Consulting Physician (Gastroenterology)  Surgical: He  has a past surgical history that includes Hernia repair; PROSTATE; and Total hip arthroplasty (Right, 03/07/2018). Family His family history includes Alzheimer's disease in his mother; Breast cancer in his daughter and sister; Diabetes in his brother; Heart disease in his brother and sister; Kidney cancer in his father; Rectal cancer in his sister; Ulcerative colitis in his sister. Social history  He reports that he quit smoking about 25 years ago. He has never used smokeless tobacco. He reports that he does not drink alcohol or use drugs.  MEDICARE WELLNESS OBJECTIVES: Physical activity: Current Exercise Habits: The patient does not participate in regular exercise at present, Exercise limited by: orthopedic condition(s) Cardiac risk factors: Cardiac Risk Factors include: advanced age (>56men, >91 women);dyslipidemia;male gender;hypertension;sedentary lifestyle;smoking/ tobacco exposure Depression/mood screen:   Depression screen [facility] 2/9 02/26/2019   Decreased Interest 0  Down, Depressed, Hopeless 0  PHQ - 2 Score 0    ADLs:  In your present state of health, do you have any difficulty performing the following activities: 02/26/2019 11/20/2018  Hearing? N N  Comment - -  Vision? N N  Difficulty concentrating or making decisions? N N  Walking or climbing stairs? N N  Comment - -  Dressing or bathing? N N  Comment - -  Doing errands, shopping? N [place] and eating ? - -  Using the Toilet? - -  In the past six months, have you accidently leaked urine? - -  Do you have problems with loss of bowel control? - -  Comment - -  Managing your Medications? - -  Managing your Finances? - -  Housekeeping or managing your Housekeeping? - -  Comment - -  Some recent data might be hidden     Cognitive Testing  Alert? Yes  Normal Appearance?Yes  Oriented to person? Yes  Place? Yes   Time? Yes  Recall of three objects?  Yes  Can perform simple calculations? Yes  Displays appropriate judgment?Yes  Can read the correct time from a watch face?Yes  EOL planning: Does Patient Have a Medical Advance Directive?: Yes Type of Advance Directive: Healthcare Power  of Attorney, Living will Does patient want to make changes to medical advance directive?: No - Patient declined Copy of [place] in Chart?: No - copy requested   Objective:   Today's Vitals   02/26/19 1055  BP: 110/64  Pulse: (!) 56  Temp: (!) 97.3 F (36.3 C)  SpO2: 99%  Weight: 198 lb (89.8 kg)  Height: 5' 9.5" (1.765 m)   Body mass index is 28.82 kg/m.  General appearance: alert, no distress, WD/WN, male HEENT: normocephalic, sclerae anicteric, TMs pearly, nares patent, no discharge or erythema, pharynx normal Oral cavity: MMM, no lesions, he does have white coating to posterior half of tongue Neck: supple, no lymphadenopathy, no thyromegaly, no masses Heart: RRR, normal S1, S2, no murmurs Lungs: CTA bilaterally, no wheezes,  rhonchi, or rales Abdomen: +bs, soft, non tender, non distended, no masses, no hepatomegaly, no splenomegaly Musculoskeletal: nontender, no swelling, no obvious deformity Extremities: no edema, no cyanosis, no clubbing Pulses: 1+ symmetric, upper and lower extremities, normal cap refill Neurological: alert, oriented x 3, CN2-12 intact, strength normal upper extremities and lower extremities, sensation normal throughout, gait slow, antalgic Psychiatric: normal affect, behavior normal, pleasant   Medicare Attestation I have personally reviewed: The patient's medical and social history Their use of alcohol, tobacco or illicit drugs Their current medications and supplements The patient's functional ability including ADLs,fall risks, home safety risks, cognitive, and hearing and visual impairment Diet and physical activities Evidence for depression or mood disorders  The patient's weight, height, BMI, and visual acuity have been recorded in the chart.  I have made referrals, counseling, and provided education to the patient based on review of the above and I have provided the patient with a written personalized care plan for preventive services.     Izora Ribas, NP   02/26/2019

## 2019-02-26 ENCOUNTER — Encounter: Payer: Self-pay | Admitting: Adult Health

## 2019-02-26 ENCOUNTER — Ambulatory Visit (INDEPENDENT_AMBULATORY_CARE_PROVIDER_SITE_OTHER): Payer: Medicare Other | Admitting: Adult Health

## 2019-02-26 ENCOUNTER — Other Ambulatory Visit: Payer: Self-pay

## 2019-02-26 VITALS — BP 110/64 | HR 56 | Temp 97.3°F | Ht 69.5 in | Wt 198.0 lb

## 2019-02-26 DIAGNOSIS — G4733 Obstructive sleep apnea (adult) (pediatric): Secondary | ICD-10-CM | POA: Diagnosis not present

## 2019-02-26 DIAGNOSIS — I10 Essential (primary) hypertension: Secondary | ICD-10-CM | POA: Diagnosis not present

## 2019-02-26 DIAGNOSIS — K5790 Diverticulosis of intestine, part unspecified, without perforation or abscess without bleeding: Secondary | ICD-10-CM

## 2019-02-26 DIAGNOSIS — I480 Paroxysmal atrial fibrillation: Secondary | ICD-10-CM | POA: Diagnosis not present

## 2019-02-26 DIAGNOSIS — R6889 Other general symptoms and signs: Secondary | ICD-10-CM | POA: Diagnosis not present

## 2019-02-26 DIAGNOSIS — N400 Enlarged prostate without lower urinary tract symptoms: Secondary | ICD-10-CM

## 2019-02-26 DIAGNOSIS — Z0001 Encounter for general adult medical examination with abnormal findings: Secondary | ICD-10-CM | POA: Diagnosis not present

## 2019-02-26 DIAGNOSIS — K148 Other diseases of tongue: Secondary | ICD-10-CM

## 2019-02-26 DIAGNOSIS — I2510 Atherosclerotic heart disease of native coronary artery without angina pectoris: Secondary | ICD-10-CM

## 2019-02-26 DIAGNOSIS — K061 Gingival enlargement: Secondary | ICD-10-CM | POA: Diagnosis not present

## 2019-02-26 DIAGNOSIS — M1612 Unilateral primary osteoarthritis, left hip: Secondary | ICD-10-CM | POA: Insufficient documentation

## 2019-02-26 DIAGNOSIS — E559 Vitamin D deficiency, unspecified: Secondary | ICD-10-CM

## 2019-02-26 DIAGNOSIS — E782 Mixed hyperlipidemia: Secondary | ICD-10-CM

## 2019-02-26 DIAGNOSIS — F325 Major depressive disorder, single episode, in full remission: Secondary | ICD-10-CM

## 2019-02-26 DIAGNOSIS — E663 Overweight: Secondary | ICD-10-CM

## 2019-02-26 DIAGNOSIS — Z79899 Other long term (current) drug therapy: Secondary | ICD-10-CM

## 2019-02-26 DIAGNOSIS — R7309 Other abnormal glucose: Secondary | ICD-10-CM

## 2019-02-26 DIAGNOSIS — Z860100 Personal history of colon polyps, unspecified: Secondary | ICD-10-CM

## 2019-02-26 DIAGNOSIS — Z0000 Encounter for general adult medical examination without abnormal findings: Secondary | ICD-10-CM

## 2019-02-26 DIAGNOSIS — F419 Anxiety disorder, unspecified: Secondary | ICD-10-CM

## 2019-02-26 DIAGNOSIS — M26623 Arthralgia of bilateral temporomandibular joint: Secondary | ICD-10-CM

## 2019-02-26 DIAGNOSIS — I2583 Coronary atherosclerosis due to lipid rich plaque: Secondary | ICD-10-CM

## 2019-02-26 NOTE — Patient Instructions (Addendum)
[name] Yu , Thank you for taking time to come for your Medicare Wellness Visit. I appreciate your ongoing commitment to your health goals. Please review the following plan we discussed and let me know if I can assist you in the future.   These are the goals we discussed: Goals    . Exercise 150 min/wk Moderate Activity     Start with 15 min of walking per day and gradually increase up to 30+ min    . LDL CALC < 130       This is a list of the screening recommended for you and due dates:  Health Maintenance  Topic Date Due  . Flu Shot  05/16/2019  . Tetanus Vaccine  12/16/2026  . Pneumonia vaccines  Completed      Exercise for Older Adults Staying physically active is important as you age. The four types of exercises that are best for older adults are endurance, strength, balance, and flexibility. Contact your health care provider before you start any exercise routine. Ask your health care provider what activities are safe for you. What are the risks? Risks associated with exercising include:  Overdoing it. This may lead to sore muscles or fatigue.  Falls.  Injuries.  Dehydration. How to do these exercises Endurance exercises Endurance (aerobic) exercises raise your breathing rate and heart rate. Increasing your endurance helps you to do everyday tasks and stay healthy. By improving the health of your body system that includes your heart, lungs, and blood vessels (circulatory system), you may also delay or prevent diseases such as heart disease, diabetes, and bone loss (osteoporosis). Types of endurance exercises include:  Sports.  Indoor activities, such as using gym equipment, doing water aerobics, or dancing.  Outdoor activities, such as biking or jogging.  Tasks around the house, such as gardening, yard work, and heavy household chores like cleaning.  Walking, such as hiking or walking around your neighborhood. When doing endurance exercises, make sure you:  Are  aware of your surroundings.  Use safety equipment as directed.  Dress in layers when exercising outdoors.  Drink plenty of water to stay well hydrated. Build up endurance slowly. Start with 10 minutes at a time, and gradually build up to doing 30 minutes at a time. Unless your health care provider gave you different instructions, aim to exercise for a total of 150 minutes a week. Spread out that time so you are working on endurance on 3 or more days a week. Strength exercises Lifting, pulling, or pushing weights helps to strengthen muscles. Having stronger muscles makes it easier to do everyday activities, such as getting up from a chair, climbing stairs, carrying groceries, and playing with grandchildren. Strength exercises include arm and leg exercises that may be done:  With weights.  Without weights (using your own body weight).  With a resistance band. When doing strength exercises:  Move smoothly and steadily. Do not suddenly thrust or jerk the weights, the resistance band, or your body.  Start with no weights or with light weights, and gradually add more weight over time. Eventually, aim to use weights that are hard or very hard for you to lift. This means that you are able to do 8 repetitions with the weight, and the last few repetitions are very challenging.  Lift or push weights into position for 3 seconds, hold the position for 1 second, and then take 3 seconds to return to your starting position.  Breathe out (exhale) during difficult movements, like lifting  or pushing weights. Breathe in (inhale) to relax your muscles before the next repetition.  Consider alternating arms or legs, especially when you first start strength exercises.  Expect some slight muscle soreness after each session. Do strength exercises on 2 or more days a week, for 30 minutes at a time. Avoid exercising the same muscle groups two days in a row. For example, if you work on your leg muscles one day, work  on your arm muscles the next day. When you can do two sets of 10-15 repetitions with a certain weight, increase the amount of weight. Balance Balance exercises can help to prevent falls. Balance exercises include:  Standing on one foot.  Heel-to-toe walk.  Balance walk.  Tai chi. Make sure you have something sturdy to hold onto while doing balance exercises, such as a sturdy chair. As your balance improves, challenge yourself by holding onto the chair with one hand instead of two, and then with no hands. Trying exercises with your eyes closed also challenges your balance, but be sure to have a sturdy surface (like a countertop) close by in case you need it. Do balance exercises as often as you want, or as often as directed by your health care provider. Strength exercises for the lower body also help to improve balance. Flexibility Flexibility exercises improve how far you can bend, straighten, move, or rotate parts of your body (range of motion). These exercises also help you to do everyday activities such as getting dressed or reaching for objects. Flexibility exercises include stretching different parts of the body, and they may be done in a standing or seated position or on the floor. When stretching, make sure you:  Keep a slight bend in your arms and legs. Avoid completely straightening ("locking") your joints.  Do not stretch so far that you feel pain. You should feel a mild stretching feeling. You may try stretching farther as you become more flexible over time.  Relax and breathe between stretches.  Hold onto something sturdy for balance as needed. Hold each stretch for 10-30 seconds. Repeat each stretch 3-5 times. General safety tips  Exercise in well-lit areas.  Do not hold your breath during exercises or stretches.  Warm up before exercising, and cool down after exercising. This can help prevent injury.  Drink plenty of water during exercise or any activity that makes you  sweat.  Use smooth, steady movements. Do not use sudden, jerking movements, especially when lifting weights or doing flexibility exercises.  If you are not sure if an exercise is safe for you, or you are not sure how to do an exercise, talk with your health care provider. This is especially important if you have had surgery on muscles, bones, or joints (orthopedic surgery). Where to find more information You can find more information about exercise for older adults from:  Your local health department, fitness center, or community center. These facilities may have programs for aging adults.  [employer] on Aging: http://kim-miller.com/  National Council on Aging: www.ncoa.org Summary  Staying physically active is important as you age.  Make sure to contact your health care provider before you start any exercise routine. Ask your health care provider what activities are safe for you.  Doing endurance, strength, balance, and flexibility exercises can help to delay or prevent certain diseases, such as heart disease, diabetes, and bone loss (osteoporosis). This information is not intended to replace advice given to you by your health care provider. Make sure you discuss any  questions you have with your health care provider. Document Released: 02/20/2017 Document Revised: 02/20/2017 Document Reviewed: 02/20/2017 Elsevier Interactive Patient Education  2019 [employer].

## 2019-02-27 LAB — LIPID PANEL
Cholesterol: 182 mg/dL (ref <200)
HDL: 53 mg/dL (ref >=40)
LDL Cholesterol (Calc): 112 mg/dL (calc) — ABNORMAL HIGH
Non-HDL Cholesterol (Calc): 129 mg/dL (calc) (ref <130)
Total CHOL/HDL Ratio: 3.4 (calc) (ref <5.0)
Triglycerides: 81 mg/dL (ref <150)

## 2019-02-27 LAB — CBC WITH DIFFERENTIAL/PLATELET
Absolute Monocytes: 844 cells/uL (ref 200–950)
Basophils Absolute: 39 cells/uL (ref 0–200)
Basophils Relative: 0.4 %
Eosinophils Absolute: 175 cells/uL (ref 15–500)
Eosinophils Relative: 1.8 %
HCT: 45.9 % (ref 38.5–50.0)
Hemoglobin: 15.8 g/dL (ref 13.2–17.1)
Lymphs Abs: 1892 cells/uL (ref 850–3900)
MCH: 32.2 pg (ref 27.0–33.0)
MCHC: 34.4 g/dL (ref 32.0–36.0)
MCV: 93.5 fL (ref 80.0–100.0)
MPV: 9.3 fL (ref 7.5–12.5)
Monocytes Relative: 8.7 %
Neutro Abs: 6751 cells/uL (ref 1500–7800)
Neutrophils Relative %: 69.6 %
Platelets: 259 10*3/uL (ref 140–400)
RBC: 4.91 10*6/uL (ref 4.20–5.80)
RDW: 12.7 % (ref 11.0–15.0)
Total Lymphocyte: 19.5 %
WBC: 9.7 10*3/uL (ref 3.8–10.8)

## 2019-02-27 LAB — COMPLETE METABOLIC PANEL WITH GFR
AG Ratio: 1.8 (calc) (ref 1.0–2.5)
ALT: 13 U/L (ref 9–46)
AST: 17 U/L (ref 10–35)
Albumin: 3.8 g/dL (ref 3.6–5.1)
Alkaline phosphatase (APISO): 68 U/L (ref 35–144)
BUN: 14 mg/dL (ref 7–25)
CO2: 29 mmol/L (ref 20–32)
Calcium: 9.2 mg/dL (ref 8.6–10.3)
Chloride: 104 mmol/L (ref 98–110)
Creat: 0.83 mg/dL (ref 0.70–1.11)
GFR, Est African American: 90 mL/min/{1.73_m2} (ref >=60)
GFR, Est Non African American: 77 mL/min/{1.73_m2} (ref >=60)
Globulin: 2.1 g/dL (calc) (ref 1.9–3.7)
Glucose, Bld: 78 mg/dL (ref 65–99)
Potassium: 4.1 mmol/L (ref 3.5–5.3)
Sodium: 140 mmol/L (ref 135–146)
Total Bilirubin: 0.4 mg/dL (ref 0.2–1.2)
Total Protein: 5.9 g/dL — ABNORMAL LOW (ref 6.1–8.1)

## 2019-02-27 LAB — HEMOGLOBIN A1C
Hgb A1c MFr Bld: 5.6 % of total Hgb (ref <5.7)
Mean Plasma Glucose: 114 (calc)
eAG (mmol/L): 6.3 (calc)

## 2019-02-27 LAB — TSH: TSH: 1.92 mIU/L (ref 0.40–4.50)

## 2019-02-27 LAB — MAGNESIUM: Magnesium: 2.1 mg/dL (ref 1.5–2.5)

## 2019-03-03 ENCOUNTER — Telehealth: Payer: Self-pay

## 2019-03-03 NOTE — Telephone Encounter (Signed)
Called patient.  No answer. LMOV.  Would like to offer an in office appointment with Christell Faith, PA

## 2019-03-05 IMAGING — CR DG CHEST 2V
2 series · 2 of 2 positions shown · non-contrast
Comparison: 12/13/2015

CLINICAL DATA: Cough and congestion for several weeks. Non smoker.
Prior images 9564

EXAM:
CHEST  2 VIEW

[chest pa]
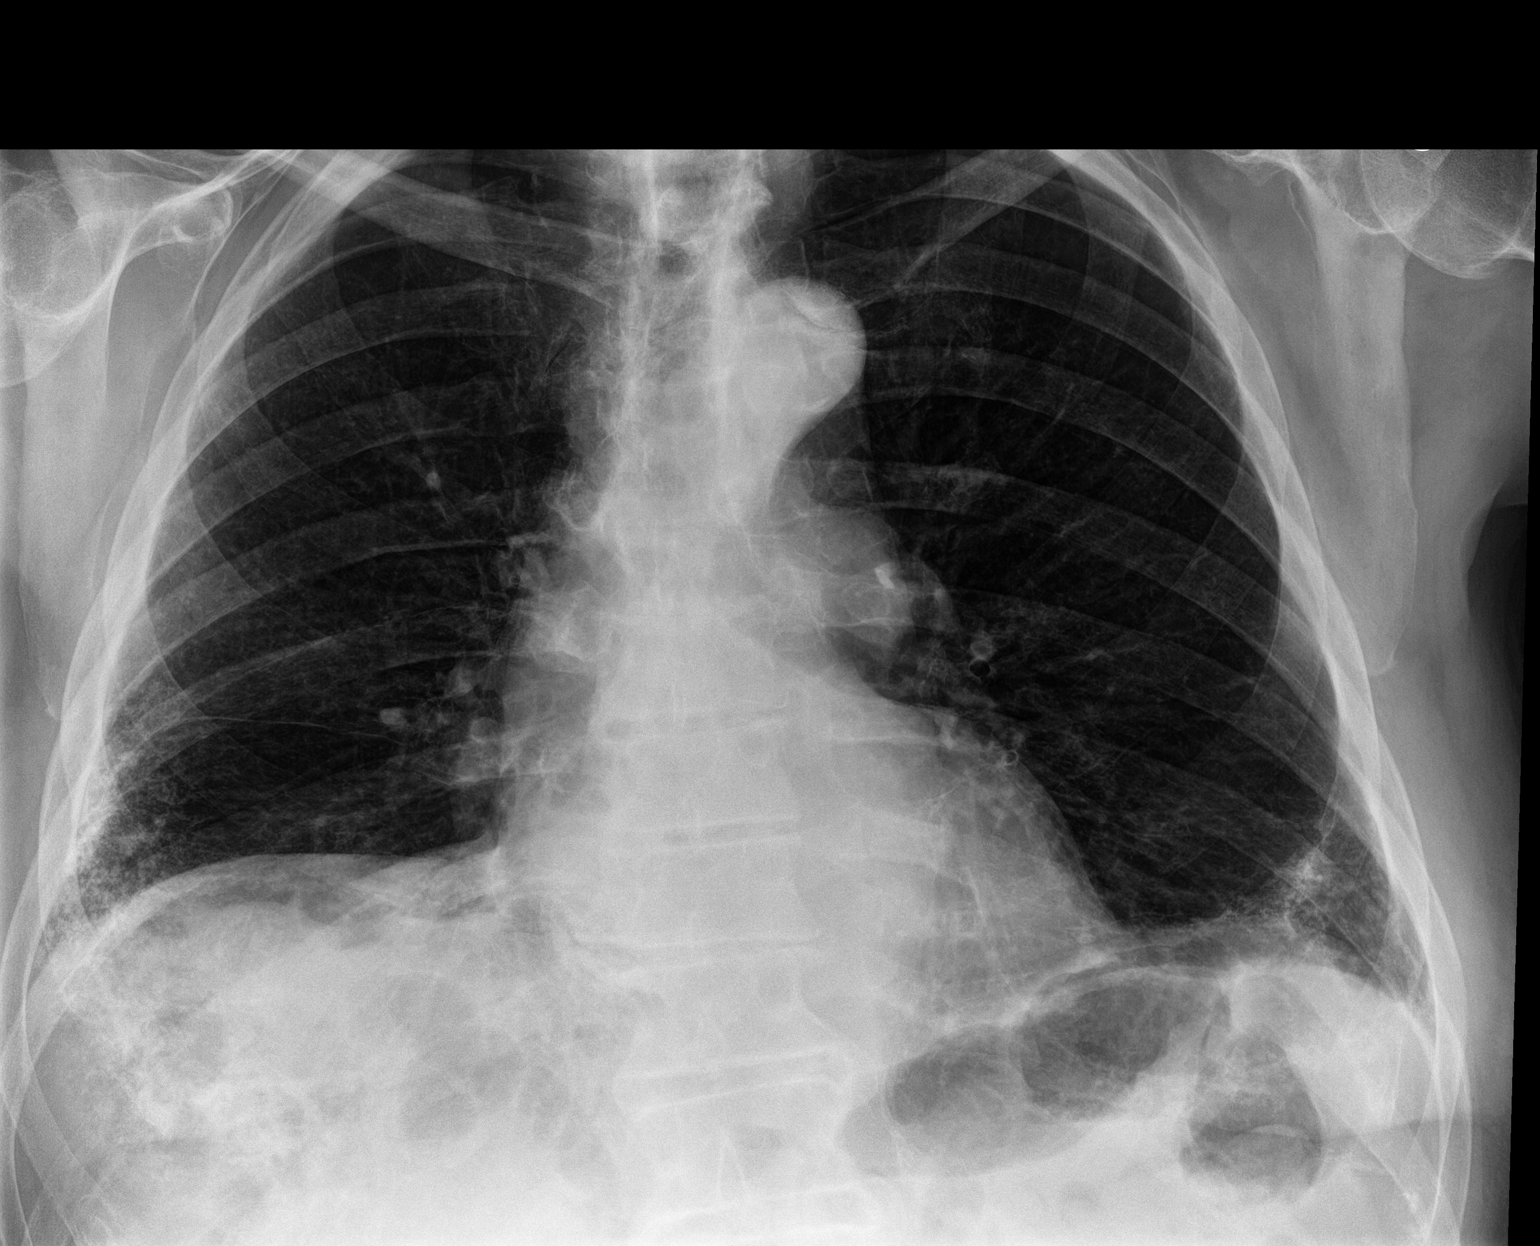

[chest lat]
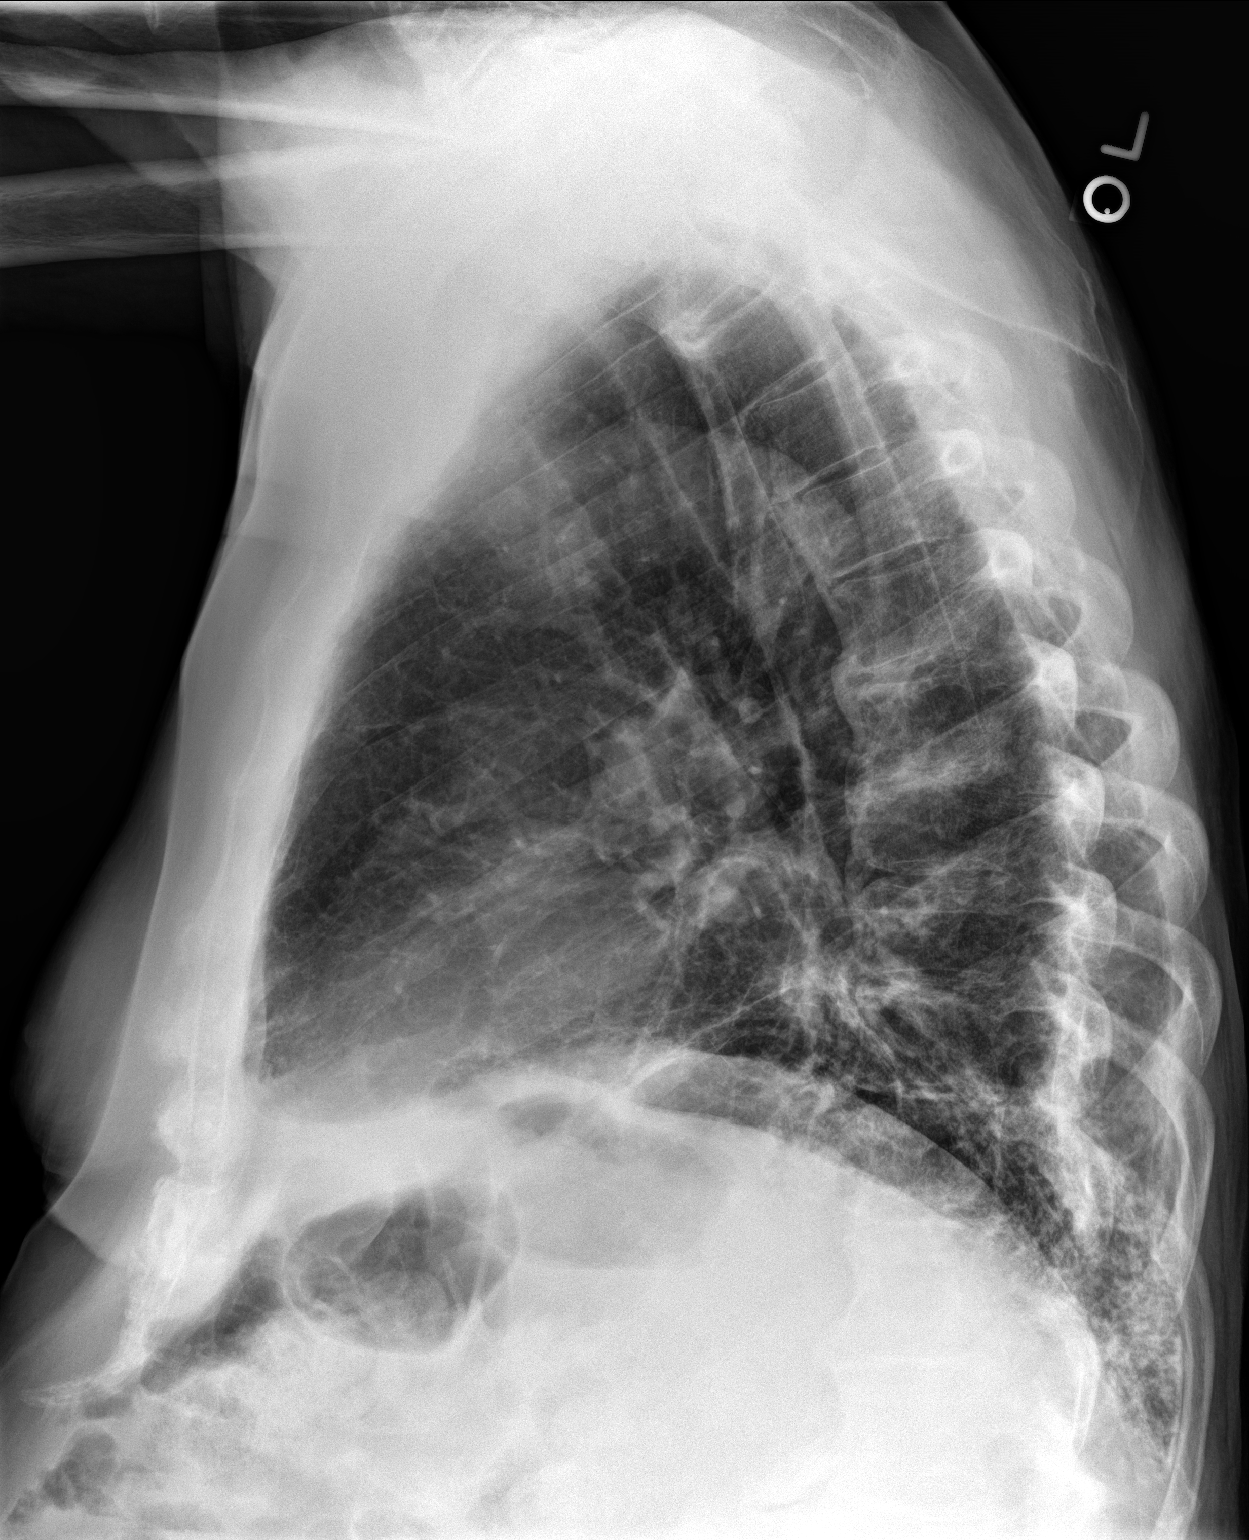

[2 of 2 positions shown; findings below may reference images not displayed]

FINDINGS: Heart size is mildly enlarged. The pulmonary artery segments are
chronically enlarged. Chronic changes are identified at the lung
bases, stable in appearance. No focal consolidations or pleural
effusions.
IMPRESSION: Stable mild cardiomegaly. Chronic fibrotic changes at the lung
bases.

## 2019-03-11 ENCOUNTER — Ambulatory Visit: Payer: Self-pay | Admitting: Adult Health

## 2019-03-19 ENCOUNTER — Telehealth: Payer: Self-pay

## 2019-03-19 NOTE — Telephone Encounter (Signed)
Called patient.  No answer. LMOV.  Need to change to Evisit Appt.

## 2019-03-24 NOTE — Telephone Encounter (Signed)
Called patient.  No answer. LMOV.  Awaiting Call Back.

## 2019-03-27 NOTE — Telephone Encounter (Signed)
Called patient.  No answer. LMOV.  Awaiting call back

## 2019-03-27 NOTE — Telephone Encounter (Signed)
Patient called back.  Made patient aware of appointment change.  Reviewed visitor restrictions with patient and his wife.  Made them aware patient will need to come through the [place] entrance and a wheel chair will be provided if needed

## 2019-03-31 ENCOUNTER — Telehealth: Payer: Self-pay

## 2019-03-31 ENCOUNTER — Ambulatory Visit: Payer: Medicare Other | Admitting: Cardiovascular Disease

## 2019-03-31 ENCOUNTER — Other Ambulatory Visit: Payer: Self-pay

## 2019-03-31 ENCOUNTER — Ambulatory Visit (INDEPENDENT_AMBULATORY_CARE_PROVIDER_SITE_OTHER): Payer: Medicare Other | Admitting: Cardiovascular Disease

## 2019-03-31 VITALS — BP 120/70 | HR 61 | Ht 70.5 in | Wt 200.2 lb

## 2019-03-31 DIAGNOSIS — E782 Mixed hyperlipidemia: Secondary | ICD-10-CM | POA: Diagnosis not present

## 2019-03-31 DIAGNOSIS — F17200 Nicotine dependence, unspecified, uncomplicated: Secondary | ICD-10-CM

## 2019-03-31 DIAGNOSIS — I2510 Atherosclerotic heart disease of native coronary artery without angina pectoris: Secondary | ICD-10-CM | POA: Diagnosis not present

## 2019-03-31 DIAGNOSIS — I10 Essential (primary) hypertension: Secondary | ICD-10-CM | POA: Diagnosis not present

## 2019-03-31 DIAGNOSIS — I2583 Coronary atherosclerosis due to lipid rich plaque: Secondary | ICD-10-CM

## 2019-03-31 DIAGNOSIS — Z87891 Personal history of nicotine dependence: Secondary | ICD-10-CM | POA: Insufficient documentation

## 2019-03-31 DIAGNOSIS — I480 Paroxysmal atrial fibrillation: Secondary | ICD-10-CM

## 2019-03-31 MED ORDER — DILTIAZEM HCL 60 MG PO TABS: 60.0000 mg | ORAL_TABLET | Freq: Two times a day (BID) | ORAL | 3 refills | Status: DC

## 2019-03-31 NOTE — Telephone Encounter (Signed)

## 2019-03-31 NOTE — Patient Instructions (Signed)

## 2019-03-31 NOTE — Progress Notes (Signed)
Cardiology Office Note  Date:  03/31/2019   ID:  [name] Yu, DOB 01-25-1927, MRN [id number]  PCP:  [name], MD   Chief Complaint  Patient presents with  . other    Patient denies chest pain and SOB. Meds reviewed verbally with patient.    HPI:  [name] Yu is a 85 yo gentleman with  Paroxysmal atrial fibrillation, 2007 at [facility], cardioversion CT scan  2008 showing coronary calcifications HTN,  Hyperlipidemia sleep apnea,  prior pipe smoking for 20 years. bradycardia on  beta blockers,  Prior history of falls Previously followed at the [facility].  presents for routine followup of his hypertension and atrial fibrillation.   On today's visit reports he is doing relatively well He is concerned about some varicose veins Wife who presents with him worries that he is not doing any exercise Very sedentary, legs are getting weaker  Denies any tachycardia concerning for arrhythmia No recent falls Takes diltiazem 60 twice daily Denies any orthostasis symptoms  Reports his weight is stable   right total hip replacement surgery with Dr. [name] Recovered well  EKG personally reviewed by myself on todays visit Shows normal sinus rhythm with  left axis deviation, poor R wave progression to the anterior precordial leads  Other past medical history reviewed (formerly on warfarin but d/c'd in 2014 for rectal sheath hematoma), -CT brain: Study is mildly limited by motion. There is a subcutaneous hematoma in the soft tissues of the left frontotemporal region.  XR L foot: Nondisplaced fracture of the distal phalanx of the great toe.  Diagnosed with post-concussive sx  Notes from the hospital indicatePer EMS, patient had a mechanical fall earlier today. Tripped on a sock. [place] and hit his head on the washer machine. Denied LOC. Was GCS 15 on EMS arrival and while en route, patient became more sluggish and confused. States he had a 5/10 headache and was worried about a head  injury. Denies any other injury.  Prior history of smoking for 30 years from age 40 to age  41 Previous CT scan reviewed with him from 2008 showing coronary calcifications   normal cath in 2003.  Normal stress test in 2005 at [facility].   07/2006 had Afib w/u at [facility].  [place] and on cardizem Dose decreased due to bradycardia Another normal stress test in 2007.    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  PSH:    Past Surgical History:  Procedure Laterality Date  . CATARACT EXTRACTION, BILATERAL Bilateral 2019   at the [facility]    . PROSTATE    . TOTAL HIP ARTHROPLASTY Right 03/07/2018   Procedure: RIGHT TOTAL HIP ARTHROPLASTY DIRECT ANTERIOR;  Surgeon: [name], MD;  Location: [place];  Service: Orthopedics;  Laterality: Right;    Current Outpatient Medications  Medication Sig Dispense Refill  . aspirin EC 81 MG tablet Take 81 mg by mouth daily.    . Calcium Carbonate Antacid (TUMS E-X PO) Take 2 tablets by mouth daily.    . Cholecalciferol 5000 UNITS TABS Take 1 tablet by mouth daily.    . citalopram (CELEXA) 40 MG tablet Take 1 tablet daily for Mood 90 tablet 3  .  diltiazem (CARDIZEM) 60 MG tablet TAKE 1 TABLET BY MOUTH TWO  TIMES DAILY 60 tablet 0  . finasteride (PROSCAR) 5 MG tablet Take 1 tablet daily for Prostate 90 tablet 3  . lidocaine (XYLOCAINE) 2 % solution USE AS DIRECTED 15ML IN THE MOUTH OR THROAT AS NEEDED FOR MOUTH PAIN 100 mL 0  . LORazepam (ATIVAN) 2 MG tablet TAKE 1/2 TO 1 TABLET BY  MOUTH AT BEDTIME ONLY IF  NEEDED AND LIMIT TO 5  DAYS/WEEK TO AVOID  ADDICTION 30 tablet 1   . Magnesium 200 MG TABS Take 1 tablet by mouth daily.    . Multiple Vitamin (MULTIVITAMIN) capsule Take 1 capsule by mouth daily.    . Omega-3 Fatty Acids (FISH OIL PO) Take 1 capsule by mouth daily.     . vitamin C (ASCORBIC ACID) 500 MG tablet Take 1,000 mg by mouth daily.     No current facility-administered medications for this visit.      Allergies:   Gabapentin, Ciprofloxacin, Novocain [procaine hcl], and Prednisone   Social History:  The patient  reports that he quit smoking about 25 years ago. He has never used smokeless tobacco. He reports that he does not drink alcohol or use drugs.   Family History:   family history includes Alzheimer's disease in his mother; Breast cancer in his daughter and sister; Diabetes in his brother; Heart disease in his brother and sister; Kidney cancer in his father; Rectal cancer in his sister; Ulcerative colitis in his sister.    Review of Systems: Review of Systems  Constitutional: Negative.   Respiratory: Negative.   Cardiovascular: Negative.   Gastrointestinal: Negative.   Musculoskeletal: Negative.        Gait instability  Neurological: Negative.   Psychiatric/Behavioral: Negative.   All other systems reviewed and are negative.     PHYSICAL EXAM: VS:  BP 120/70 (BP Location: Left Arm, Patient Position: Sitting, Cuff Size: Normal)   Pulse 61   Ht 5' 10.5" (1.791 m)   Wt 200 lb 4 oz (90.8 kg)   BMI 28.33 kg/m  , BMI Body mass index is 28.33 kg/m. Constitutional:  oriented to person, place, and time. No distress.  HENT:  Head: Normocephalic and atraumatic.  Eyes:  no discharge. No scleral icterus.  Neck: Normal range of motion. Neck supple. No JVD present.  Cardiovascular: Normal rate, regular rhythm, normal heart sounds and intact distal pulses. Exam reveals no gallop and no friction rub. No edema No murmur heard. Pulmonary/Chest: Effort normal and breath sounds normal. No stridor. No respiratory distress.  no wheezes.  no  rales.  no tenderness.  Abdominal: Soft.  no distension.  no tenderness.  Musculoskeletal: Normal range of motion.  no  tenderness or deformity.  Neurological:  normal muscle tone. Coordination normal. No atrophy Skin: Skin is warm and dry. No rash noted. not diaphoretic.  Psychiatric:  normal mood and affect. behavior is normal. Thought content normal.    Recent Labs: 02/26/2019: ALT 13; BUN 14; Creat 0.83; Hemoglobin 15.8; Magnesium 2.1; Platelets 259; Potassium 4.1; Sodium 140; TSH 1.92    Lipid Panel Lab Results  Component Value Date   CHOL 182 02/26/2019   HDL 53 02/26/2019   LDLCALC 112 (H) 02/26/2019   TRIG 81 02/26/2019      Wt Readings from Last 3 Encounters:  03/31/19 200 lb 4 oz (90.8 kg)  02/26/19 198 lb (89.8 kg)  01/27/19 200 lb (90.7 kg)       ASSESSMENT AND  PLAN:  Essential hypertension - Plan: EKG 12-Lead Blood pressure is well controlled on today's visit. No changes made to the medications.  Mixed hyperlipidemia - Plan: EKG 12-Lead Currently not on a statin Long discussion with him, he does not want a statin despite prior CT scan 2008 showing coronary calcification  Paroxysmal atrial fibrillation ([place]) - Plan: EKG 12-Lead  denies any tachycardia concerning for arrhythmia Currently on low-dose aspirin Previous fall with trauma to the left temporal region Also previous hematoma on warfarin Wife and patient did not want anticoagulation Atrial fibrillation back in 2007, no clear documentation of atrial fibrillation since that time Previously discussed risk of stroke, they declined anticoagulation  Coronary artery disease due to lipid rich plaque - Plan: EKG 12-Lead Currently with no symptoms of angina. No further workup at this time. Continue current medication regimen.  Stable  OSA (obstructive sleep apnea) - Plan: EKG 12-Lead Managed by primary care  Fall, subsequent encounter No recent falls   Disposition:   F/U  12 months   Total encounter  time more than 25 minutes  Greater than 50% was spent in counseling and coordination of care with the patient    No orders of the defined types were placed in this encounter.    Signed, Esmond Plants, M.D., Ph.D. 03/31/2019  [place], [place]

## 2019-04-15 ENCOUNTER — Telehealth: Payer: Self-pay | Admitting: Orthopaedic Surgery

## 2019-04-15 NOTE — Telephone Encounter (Signed)
I left voicemail asking for return call to [facility] office.

## 2019-04-15 NOTE — Telephone Encounter (Signed)
Offered patient appt tomorrow with another provider, but patient's wife would like to wait and see Dr. [name] next week since pain is not constant. Appt scheduled for 7/7.

## 2019-04-15 NOTE — Telephone Encounter (Signed)
I spoke with patient's wife Collie Siad.  Patient was lifting something to put in wheelbarrow and has occasional pain that grabs him in his left hip. Denies back pain.

## 2019-04-15 NOTE — Telephone Encounter (Signed)
Pt called in said he is having pain in his left hip and having trouble walking and getting around. Wanted to have a call back from dr.yates or betsy to discuss what they should do?   [phone]

## 2019-04-21 ENCOUNTER — Encounter: Payer: Self-pay | Admitting: Orthopaedic Surgery

## 2019-04-21 ENCOUNTER — Ambulatory Visit: Payer: Self-pay

## 2019-04-21 ENCOUNTER — Other Ambulatory Visit: Payer: Self-pay

## 2019-04-21 ENCOUNTER — Ambulatory Visit (INDEPENDENT_AMBULATORY_CARE_PROVIDER_SITE_OTHER): Payer: Medicare Other | Admitting: Orthopaedic Surgery

## 2019-04-21 VITALS — Ht 70.5 in | Wt 200.0 lb

## 2019-04-21 DIAGNOSIS — Z96641 Presence of right artificial hip joint: Secondary | ICD-10-CM | POA: Diagnosis not present

## 2019-04-21 DIAGNOSIS — M1612 Unilateral primary osteoarthritis, left hip: Secondary | ICD-10-CM

## 2019-04-21 DIAGNOSIS — M25552 Pain in left hip: Secondary | ICD-10-CM

## 2019-04-21 NOTE — Progress Notes (Signed)
Office Visit Note   Patient: [name] Yu           Date of Birth: 27-Aug-1927           MRN: [id number] Visit Date: 04/21/2019              Requested by: [name], [place],  [place] [id number] PCP: [name], MD   Assessment & Plan: Visit Diagnoses:  1. Pain of left hip joint   2. Arthritis of left hip   3. H/O total hip arthroplasty, right     Plan: Over the last years patient developed significant progressive left hip osteoarthritis with complete loss of joint space, marginal osteophytes.  He is used a cane and walker is having great difficulty ambulating.  We discussed right hip from last year's doing well but he has developed rapidly progressing left hip osteoarthritis.  He will require total hip arthroplasty.  We discussed spinal versus general anesthesia.  He had some confusion after general anesthesia.  Wife is present with him today and she like to be present with him in the hospital room postoperatively to help with his mental confusion.  She will talk to the hospital about that possibility with current restrictions with COVID.  They will call when they like to proceed with surgical scheduling.  Follow-Up Instructions: No follow-ups on file.   Orders:  Orders Placed This Encounter  Procedures  . XR HIP UNILAT W OR W/O PELVIS 2-3 VIEWS LEFT   No orders of the defined types were placed in this encounter.     Procedures: No procedures performed   Clinical Data: No additional findings.   Subjective: Chief Complaint  Patient presents with  . Left Hip - Pain    HPI 85 year old male seen with 4-month history of rapidly progressing left groin pain that radiates down to his knee with difficulty ambulating.  He states he was lifting a bag of soil to put a wheelbarrow and suddenly had sharp increase pain.  He states is similar to the pain that he had in his right hip when he had to have right hip arthroplasty 03/07/2018.   Review of Systems 14 point is systems positive for previous right total total hip arthroplasty May 2019 doing well.  History of hyperlipidemia sleep apnea, atrial fibrillation, coronary artery disease, depression, vitamin D deficiency, anxiety, hypertension otherwise negative is obtains HPI.   Objective: Vital Signs: Ht 5' 10.5" (1.791 m)   Wt 200 lb (90.7 kg)   BMI 28.29 kg/m   Physical Exam Constitutional:      Appearance: He is well-developed.  HENT:     Head: Normocephalic and atraumatic.  Eyes:     Pupils: Pupils are equal, round, and reactive to light.  Neck:     Thyroid: No thyromegaly.     Trachea: No tracheal deviation.  Cardiovascular:     Rate and Rhythm: Normal rate.  Pulmonary:     Effort: Pulmonary effort is normal.     Breath sounds: No wheezing.  Abdominal:     General: Bowel sounds are normal.     Palpations: Abdomen is soft.  Skin:    General: Skin is warm and dry.     Capillary Refill: Capillary refill takes less than 2 seconds.  Neurological:     Mental Status: He is alert and oriented to person, place, and time.  Psychiatric:        Behavior: Behavior normal.  Thought Content: Thought content normal.        Judgment: Judgment normal.     Ortho Exam patient has well-healed right total hip arthroplasty incision.  Distal pulses are intact.  Patient has sharp pain with internal rotation left hip at 15 degrees which reproduces pain and radiates down to his knee.  Positive Trendelenburg gait.  He is in a wheelchair coming from the front and has pronounced antalgic gait with left groin pain.  He gets relief with sitting.  Negative straight leg raising 90 degrees.  Specialty Comments:  No specialty comments available.  Imaging: No results found.   PMFS History: Patient Active Problem List   Diagnosis Date Noted  . Smoker 03/31/2019  . Arthritis of left hip 02/26/2019  . Gingival enlargement 12/23/2018  . H/O total hip arthroplasty, right  03/17/2018  . BPH (benign prostatic hyperplasia) 02/26/2018  . Anxiety 10/16/2017  . CAD (coronary artery disease) 12/28/2015  . Overweight (BMI 25.0-29.9) 08/21/2015  . Encounter for Medicare annual wellness exam 04/27/2015  . Vitamin D deficiency 03/04/2014  . Medication management 02/17/2014  . ASHD (arteriosclerotic heart disease)   . Depression, major, in remission ([place])   . Diverticulosis   . Personal history of colonic polyps 12/29/2012  . Essential hypertension   . Abnormal glucose (prediabetes)   . Mixed hyperlipidemia   . OSA (obstructive sleep apnea)   . Atrial fibrillation [facility])    Past Medical History:  Diagnosis Date  . Arthritis   . ASHD (arteriosclerotic heart disease)   . Atrial fib/flutter, transient    off coumadin since 11/2013- CHADSVAC of 2  . Cancer (HCC)    BASAL CELL   . Colon polyps    2003 hyperplastic and tubuler adenoma  . Depression   . Diverticulosis   . Dyslipidemia   . Dysrhythmia   . FHx: heart disease 08/11/2018  . History of kidney stones   . Hypertension   . OSA (obstructive sleep apnea)    NO CPAP IN 10+ YEARS  . Pre-diabetes   . Rectus sheath hematoma 11/21/2013   Dr. Deatra Ina   . Status post total replacement of right hip 03/17/2018    Family History  Problem Relation Age of Onset  . Alzheimer's disease Mother   . Kidney cancer Father   . Diabetes Brother   . Breast cancer Sister   . Rectal cancer Sister   . Breast cancer Daughter   . Ulcerative colitis Sister   . Heart disease Brother   . Heart disease Sister     Past Surgical History:  Procedure Laterality Date  . CATARACT EXTRACTION, BILATERAL Bilateral 2019   at the [facility]    . PROSTATE    . TOTAL HIP ARTHROPLASTY Right 03/07/2018   Procedure: RIGHT TOTAL HIP ARTHROPLASTY DIRECT ANTERIOR;  Surgeon: [name], MD;  Location: [facility];  Service: Orthopedics;  Laterality: Right;   Social History   Occupational History  . Occupation:  Retired  Tobacco Use  . Smoking status: Former Smoker    Quit date: 09/14/1993    Years since quitting: 25.6  . Smokeless tobacco: Never Used  Substance and Sexual Activity  . Alcohol use: No  . Drug use: No  . Sexual activity: Not on file

## 2019-06-07 ENCOUNTER — Encounter: Payer: Self-pay | Admitting: Internal Medicine

## 2019-06-07 DIAGNOSIS — R7303 Prediabetes: Secondary | ICD-10-CM | POA: Insufficient documentation

## 2019-06-07 NOTE — Progress Notes (Signed)
History of Present Illness:      This very nice 85 y.o. MWM presents for 6 month follow up with HTN, HLD, Pre-Diabetes and Vitamin D Deficiency. Patient is s/p TURP (1980) - on Finasteride and is c/o LUTS with  frequent small voiding's and  sensation of incomplete emptying. Also has been expectorating yellowish sinus drainage / PND. Further wife reports he'd been more moody, irritable, short fused & snappy with her .      Patient is treated for HTN (2005) & BP has been controlled at home. Today's BP is at goal - 126/72. Patient had a Negative Heart Cath in 2003 and Negative Cardiolite x 2 in 2005 & 2007. In 2007, he has pAfib and was started on Coumadin until he developed a large spontaneous Rectus sheath hematoma in Jan 2015 and was switched to LD ASA.   Patient has had no complaints of any cardiac type chest pain, palpitations, dyspnea / orthopnea / PND, dizziness, claudication, or dependent edema.  Hyperlipidemia is controlled with diet & meds. Patient denies myalgias or other med SE's. Last Lipids were not at goal and not treated aggressively due to age: Lab Results  Component Value Date   CHOL 182 02/26/2019   HDL 53 02/26/2019   LDLCALC 112 (H) 02/26/2019   TRIG 81 02/26/2019   CHOLHDL 3.4 02/26/2019       Also, the patient has history of PreDiabetes (A1c 5.7% / 2014) and has had no symptoms of reactive hypoglycemia, diabetic polys, paresthesias or visual blurring.  Last A1c was Normal & at goal: Lab Results  Component Value Date   HGBA1C 5.6 02/26/2019       Further, the patient also has history of Vitamin D Deficiency and supplements vitamin D without any suspected side-effects. Last vitamin D was near goal (70-110): Lab Results  Component Value Date   VD25OH 58 11/21/2018   Current Outpatient Medications on File Prior to Visit  Medication Sig  . aspirin EC 81 MG tablet Take 81 mg by mouth daily.  . Calcium Carbonate Antacid (TUMS E-X PO) Take 2 tablets by mouth daily.   . Cholecalciferol 5000 UNITS TABS Take 1 tablet by mouth daily.  . citalopram (CELEXA) 40 MG tablet Take 1 tablet daily for Mood  . diltiazem (CARDIZEM) 60 MG tablet Take 1 tablet (60 mg total) by mouth 2 (two) times daily.  . finasteride (PROSCAR) 5 MG tablet Take 1 tablet daily for Prostate  . LORazepam (ATIVAN) 2 MG tablet TAKE 1/2 TO 1 TABLET BY  MOUTH AT BEDTIME ONLY IF  NEEDED AND LIMIT TO 5  DAYS/WEEK TO AVOID  ADDICTION  . Magnesium 200 MG TABS Take 1 tablet by mouth daily.  . Multiple Vitamin (MULTIVITAMIN) capsule Take 1 capsule by mouth daily.  . vitamin C (ASCORBIC ACID) 500 MG tablet Take 1,000 mg by mouth daily.   No current facility-administered medications on file prior to visit.    Allergies  Allergen Reactions  . Gabapentin     Confusion   . Ciprofloxacin     dysphoria  . Novocain [Procaine Hcl]   . Prednisone     Nervousness   PMHx:   Past Medical History:  Diagnosis Date  . Arthritis   . ASHD (arteriosclerotic heart disease)   . Atrial fib/flutter, transient    off coumadin since 11/2013- CHADSVAC of 2  . Cancer (HCC)    BASAL CELL   . Colon polyps    2003 hyperplastic  and tubuler adenoma  . Depression   . Diverticulosis   . Dyslipidemia   . Dysrhythmia   . FHx: heart disease 08/11/2018  . History of kidney stones   . Hypertension   . OSA (obstructive sleep apnea)    NO CPAP IN 10+ YEARS  . Pre-diabetes   . Rectus sheath hematoma 11/21/2013   Dr. Deatra Ina   . Status post total replacement of right hip 03/17/2018   Immunization History  Administered Date(s) Administered  . DT 07/06/2014  . Influenza Split 07/20/2014, 07/18/2016  . Influenza, High Dose Seasonal PF 08/22/2015, 07/15/2017  . Pneumococcal Conjugate-13 07/06/2014  . Pneumococcal Polysaccharide-23 06/09/2013  . Td 12/15/2016  . Zoster 10/15/2005   Past Surgical History:  Procedure Laterality Date  . CATARACT EXTRACTION, BILATERAL Bilateral 2019   at the [facility]    . PROSTATE    . TOTAL HIP ARTHROPLASTY Right 03/07/2018   Procedure: RIGHT TOTAL HIP ARTHROPLASTY DIRECT ANTERIOR;  Surgeon: [name], MD;  Location: [place];  Service: Orthopedics;  Laterality: Right;    FHx:    Reviewed / unchanged  SHx:    Reviewed / unchanged   Systems Review:  Constitutional: Denies fever, chills, wt changes, headaches, insomnia, fatigue, night sweats, change in appetite. Eyes: Denies redness, blurred vision, diplopia, discharge, itchy, watery eyes.  ENT: Denies discharge, congestion, post nasal drip, epistaxis, sore throat, earache, hearing loss, dental pain, tinnitus, vertigo, sinus pain, snoring.  CV: Denies chest pain, palpitations, irregular heartbeat, syncope, dyspnea, diaphoresis, orthopnea, PND, claudication or edema. Respiratory: denies cough, dyspnea, DOE, pleurisy, hoarseness, laryngitis, wheezing.  Gastrointestinal: Denies dysphagia, odynophagia, heartburn, reflux, water brash, abdominal pain or cramps, nausea, vomiting, bloating, diarrhea, constipation, hematemesis, melena, hematochezia  or hemorrhoids. Genitourinary: Denies dysuria, frequency, urgency, nocturia, hesitancy, discharge, hematuria or flank pain. Musculoskeletal: Denies arthralgias, myalgias, stiffness, jt. swelling, pain, limping or strain/sprain.  Skin: Denies pruritus, rash, hives, warts, acne, eczema or change in skin lesion(s). Neuro: No weakness, tremor, incoordination, spasms, paresthesia or pain. Psychiatric: Denies confusion, memory loss or sensory loss. Endo: Denies change in weight, skin or hair change.  Heme/Lymph: No excessive bleeding, bruising or enlarged lymph nodes.  Physical Exam  BP 126/72   Pulse 76   Temp (!) 97 F (36.1 C)   Resp 16   Ht 5\' 11"  (1.803 m)   Wt 194 lb 1.6 oz (88 kg)   BMI 27.07 kg/m   Appears  well nourished, well groomed  and in no distress.  Eyes: PERRLA, EOMs, conjunctiva no swelling or erythema. Sinuses: No  frontal/maxillary tenderness ENT/Mouth: EAC's clear, TM's nl w/o erythema, bulging. Nares clear w/o erythema, swelling, exudates. Oropharynx clear without erythema or exudates. Oral hygiene is good. Tongue normal, non obstructing. Hearing intact.  Neck: Supple. Thyroid not palpable. Car 2+/2+ without bruits, nodes or JVD. Chest: Respirations nl with BS clear & equal w/o rales, rhonchi, wheezing or stridor.  Cor: Heart sounds normal w/ regular rate and rhythm without sig. murmurs, gallops, clicks or rubs. Peripheral pulses normal and equal  without edema.  Abdomen: Soft & bowel sounds normal. Non-tender w/o guarding, rebound, hernias, masses or organomegaly.  Lymphatics: Unremarkable.  Musculoskeletal: Full ROM all peripheral extremities, joint stability, 5/5 strength and normal gait.  Skin: Warm, dry without exposed rashes, lesions or ecchymosis apparent.  Neuro: Cranial nerves intact, reflexes equal bilaterally. Sensory-motor testing grossly intact. Tendon reflexes grossly intact.  Pysch: Alert & oriented x 3.  Insight and judgement  nl & appropriate. No ideations.  Assessment and Plan:  1. Essential hypertension  - Continue medication, monitor blood pressure at home.  - Continue DASH diet.  Reminder to go to the ER if any CP,  SOB, nausea, dizziness, severe HA, changes vision/speech.  - CBC with Differential/Platelet - COMPLETE METABOLIC PANEL WITH GFR - Magnesium - TSH  2. Hyperlipidemia, mixed  - Continue diet/meds, exercise,& lifestyle modifications.  - Continue monitor periodic cholesterol/liver & renal functions   - TSH  3. Abnormal glucose  - Continue diet, exercise  - Lifestyle modifications.  - Monitor appropriate labs.  - Hemoglobin A1c - Insulin, random  4. Vitamin D deficiency  - Continue supplementation.  - VITAMIN D 25 Hydroxyl  5. Paroxysmal atrial fibrillation (HCC)  - TSH  6. Benign localized prostatic hyperplasia with lower urinary tract symptoms  (LUTS)  - tamsulosin (FLOMAX) 0.4 MG CAPS capsule; Take 1 tablet at Bedtime\  Disp: 90 caps  7. Prediabetes  - Continue diet, exercise  - Lifestyle modifications.  - Monitor appropriate labs.  - Hemoglobin A1c - Insulin, random  8. Subacute pansinusitis  - azithromycin (ZITHROMAX) 250 MG tablet; Take 2 tablets on  Day 1,  followed by 1 tablet  daily for 4 more days for Infection - Important to take with Food  Dispense: 6 each; Refill: 1  9. Medication management  - CBC with Differential/Platelet - COMPLETE METABOLIC PANEL WITH GFR - Magnesium - TSH - Hemoglobin A1c - Insulin, random - VITAMIN D 25 Hydroxyl      Patient advise to increase his Citalopram 40 mg from 1/2 to 1 whole tablet expecting improvement in his affect / demeanor in 3 -4 weeks. Discussed  regular exercise, BP monitoring, weight control to achieve/maintain BMI less than 25 and discussed med and SE's. Recommended labs to assess and monitor clinical status with further disposition pending results of labs.  I discussed the assessment and treatment plan with the patient. The patient was provided an opportunity to ask questions and all were answered. The patient agreed with the plan and demonstrated an understanding of the instructions.  I provided over 30 minutes of exam, counseling, chart review and  complex critical decision making.  Kirtland Bouchard, MD

## 2019-06-07 NOTE — Patient Instructions (Signed)

## 2019-06-08 ENCOUNTER — Other Ambulatory Visit: Payer: Self-pay

## 2019-06-08 ENCOUNTER — Ambulatory Visit (INDEPENDENT_AMBULATORY_CARE_PROVIDER_SITE_OTHER): Payer: Medicare Other | Admitting: Internal Medicine

## 2019-06-08 VITALS — BP 126/72 | HR 76 | Temp 97.0°F | Resp 16 | Ht 71.0 in | Wt 194.1 lb

## 2019-06-08 DIAGNOSIS — N401 Enlarged prostate with lower urinary tract symptoms: Secondary | ICD-10-CM

## 2019-06-08 DIAGNOSIS — I10 Essential (primary) hypertension: Secondary | ICD-10-CM

## 2019-06-08 DIAGNOSIS — R7303 Prediabetes: Secondary | ICD-10-CM

## 2019-06-08 DIAGNOSIS — R7309 Other abnormal glucose: Secondary | ICD-10-CM | POA: Diagnosis not present

## 2019-06-08 DIAGNOSIS — Z79899 Other long term (current) drug therapy: Secondary | ICD-10-CM

## 2019-06-08 DIAGNOSIS — E782 Mixed hyperlipidemia: Secondary | ICD-10-CM | POA: Diagnosis not present

## 2019-06-08 DIAGNOSIS — E559 Vitamin D deficiency, unspecified: Secondary | ICD-10-CM | POA: Diagnosis not present

## 2019-06-08 DIAGNOSIS — I480 Paroxysmal atrial fibrillation: Secondary | ICD-10-CM

## 2019-06-08 DIAGNOSIS — J0140 Acute pansinusitis, unspecified: Secondary | ICD-10-CM

## 2019-06-08 MED ORDER — AZITHROMYCIN 250 MG PO TABS: ORAL_TABLET | ORAL | 1 refills | Status: DC

## 2019-06-08 MED ORDER — TAMSULOSIN HCL 0.4 MG PO CAPS: ORAL_CAPSULE | ORAL | 3 refills | Status: DC

## 2019-06-09 LAB — HEMOGLOBIN A1C
Hgb A1c MFr Bld: 5.7 % of total Hgb — ABNORMAL HIGH (ref <5.7)
Mean Plasma Glucose: 117 (calc)
eAG (mmol/L): 6.5 (calc)

## 2019-06-09 LAB — COMPLETE METABOLIC PANEL WITH GFR
AG Ratio: 1.9 (calc) (ref 1.0–2.5)
ALT: 14 U/L (ref 9–46)
AST: 16 U/L (ref 10–35)
Albumin: 3.9 g/dL (ref 3.6–5.1)
Alkaline phosphatase (APISO): 62 U/L (ref 35–144)
BUN: 12 mg/dL (ref 7–25)
CO2: 28 mmol/L (ref 20–32)
Calcium: 9.8 mg/dL (ref 8.6–10.3)
Chloride: 105 mmol/L (ref 98–110)
Creat: 0.76 mg/dL (ref 0.70–1.11)
GFR, Est African American: 93 mL/min/{1.73_m2} (ref >=60)
GFR, Est Non African American: 80 mL/min/{1.73_m2} (ref >=60)
Globulin: 2.1 g/dL (calc) (ref 1.9–3.7)
Glucose, Bld: 116 mg/dL — ABNORMAL HIGH (ref 65–99)
Potassium: 4 mmol/L (ref 3.5–5.3)
Sodium: 139 mmol/L (ref 135–146)
Total Bilirubin: 0.6 mg/dL (ref 0.2–1.2)
Total Protein: 6 g/dL — ABNORMAL LOW (ref 6.1–8.1)

## 2019-06-09 LAB — CBC WITH DIFFERENTIAL/PLATELET
Absolute Monocytes: 813 cells/uL (ref 200–950)
Basophils Absolute: 33 cells/uL (ref 0–200)
Basophils Relative: 0.4 %
Eosinophils Absolute: 332 cells/uL (ref 15–500)
Eosinophils Relative: 4 %
HCT: 48.4 % (ref 38.5–50.0)
Hemoglobin: 16.6 g/dL (ref 13.2–17.1)
Lymphs Abs: 1569 cells/uL (ref 850–3900)
MCH: 31.8 pg (ref 27.0–33.0)
MCHC: 34.3 g/dL (ref 32.0–36.0)
MCV: 92.7 fL (ref 80.0–100.0)
MPV: 9.3 fL (ref 7.5–12.5)
Monocytes Relative: 9.8 %
Neutro Abs: 5553 cells/uL (ref 1500–7800)
Neutrophils Relative %: 66.9 %
Platelets: 228 10*3/uL (ref 140–400)
RBC: 5.22 10*6/uL (ref 4.20–5.80)
RDW: 12.7 % (ref 11.0–15.0)
Total Lymphocyte: 18.9 %
WBC: 8.3 10*3/uL (ref 3.8–10.8)

## 2019-06-09 LAB — MAGNESIUM: Magnesium: 2 mg/dL (ref 1.5–2.5)

## 2019-06-09 LAB — INSULIN, RANDOM: Insulin: 27 u[IU]/mL — ABNORMAL HIGH

## 2019-06-09 LAB — VITAMIN D 25 HYDROXY (VIT D DEFICIENCY, FRACTURES): Vit D, 25-Hydroxy: 58 ng/mL (ref 30–100)

## 2019-06-09 LAB — TSH: TSH: 2.53 mIU/L (ref 0.40–4.50)

## 2019-07-18 ENCOUNTER — Other Ambulatory Visit: Payer: Self-pay | Admitting: Adult Health Nurse Practitioner

## 2019-07-18 DIAGNOSIS — F411 Generalized anxiety disorder: Secondary | ICD-10-CM

## 2019-08-05 ENCOUNTER — Other Ambulatory Visit: Payer: Self-pay | Admitting: Internal Medicine

## 2019-08-05 DIAGNOSIS — F325 Major depressive disorder, single episode, in full remission: Secondary | ICD-10-CM

## 2019-08-20 ENCOUNTER — Telehealth: Payer: Self-pay | Admitting: *Deleted

## 2019-08-20 DIAGNOSIS — R918 Other nonspecific abnormal finding of lung field: Secondary | ICD-10-CM | POA: Diagnosis not present

## 2019-08-20 DIAGNOSIS — R05 Cough: Secondary | ICD-10-CM | POA: Diagnosis not present

## 2019-08-20 NOTE — Telephone Encounter (Signed)
Patient's wife called and reported the patient is still having chest congestion and phlegm. Per Dr Melford Aase, since the patient has ad 3 rounds of antibiotics, he will need a chest x-ray and an office visit to be examined. The patient states he does not feel well and does not want to drive to our office in [place]. Dr Melford Aase advised he go to an urgent care in his area that is associated with [facility], since they have access to his records at [facility].  The spouse agrees.

## 2019-08-31 IMAGING — RF DG C-ARM 61-120 MIN
1 series · 2 of 2 positions shown · non-contrast
Comparison: None.

CLINICAL DATA: Right hip replacement

EXAM:
OPERATIVE RIGHT HIP WITH PELVIS; DG C-ARM 61-120 MIN

[Series 1: run · 2 of 2 slices shown]
[im 1/2]
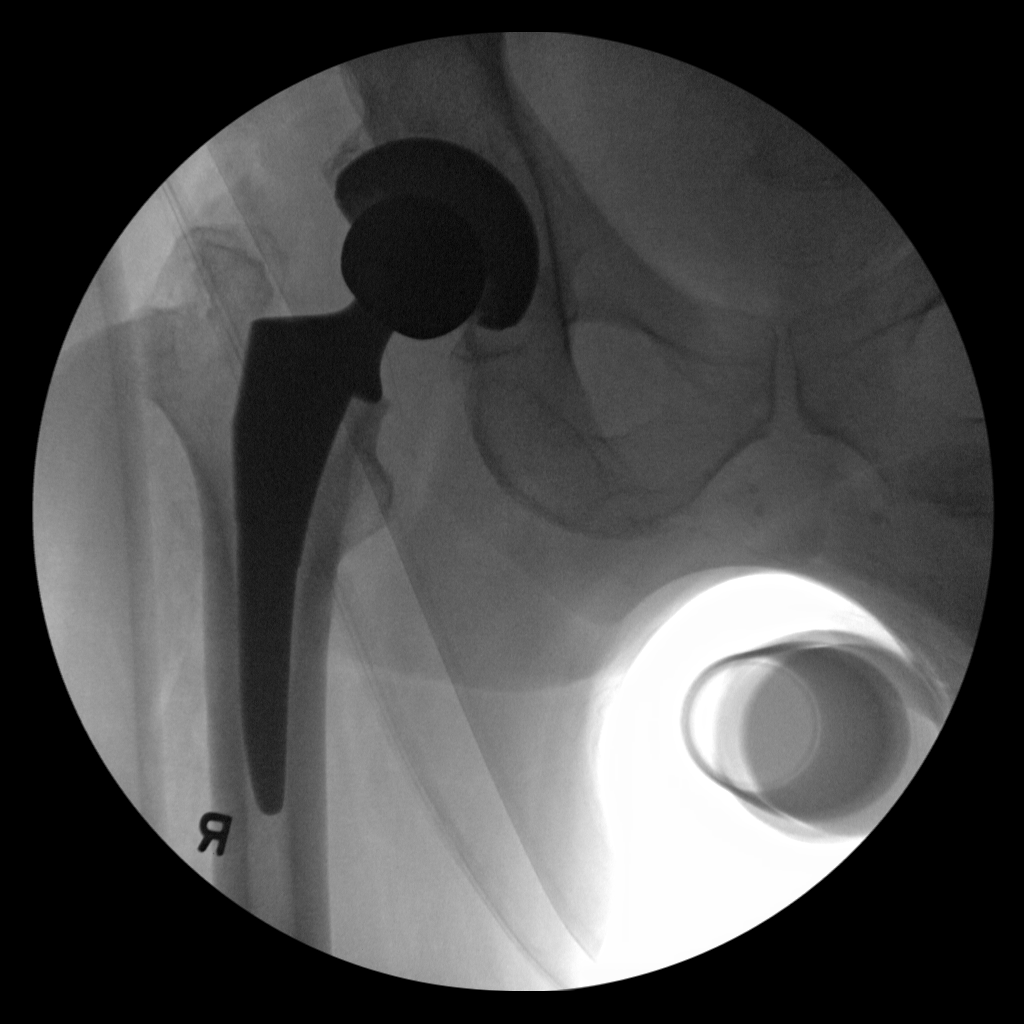
[im 2/2]
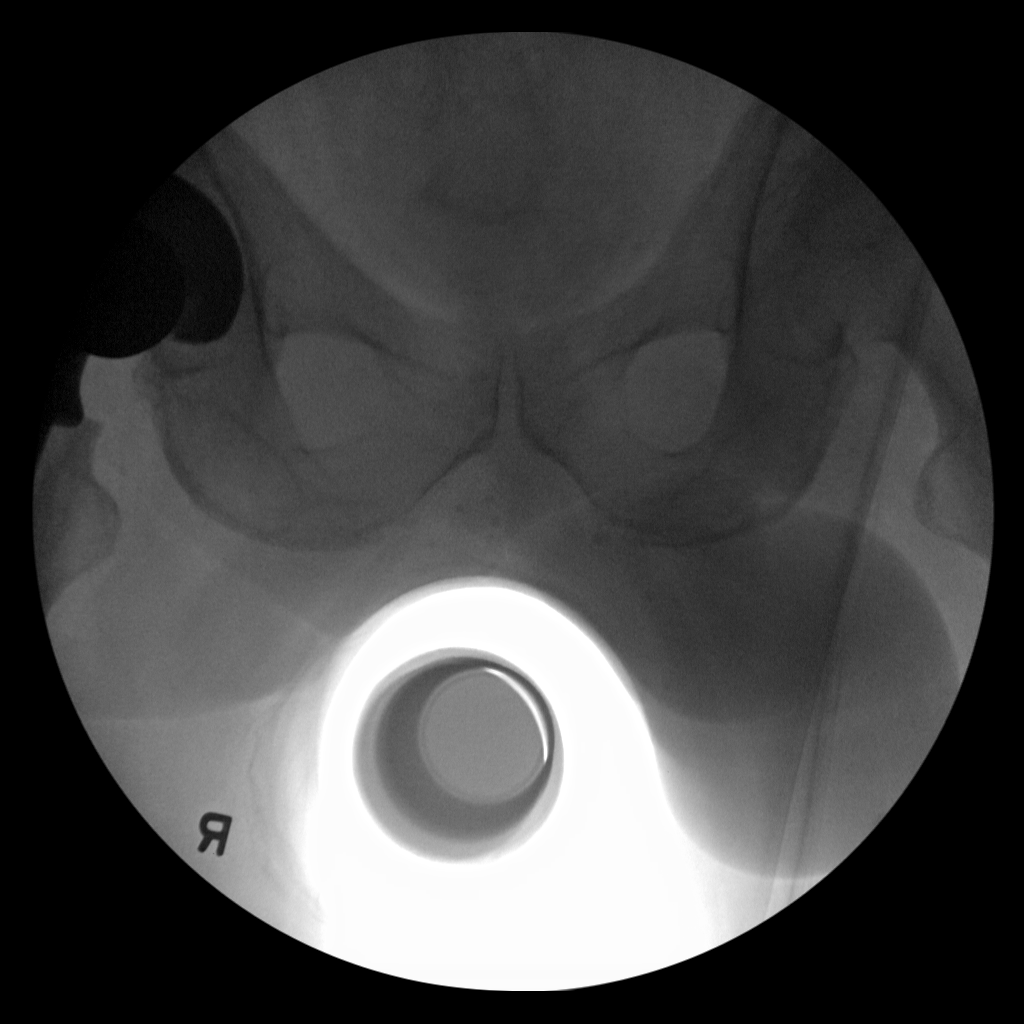

[2 of 2 positions shown; findings below may reference images not displayed]

FLUOROSCOPY TIME:  Radiation Exposure Index (as provided by the
fluoroscopic device): Not available

If the device does not provide the exposure index:

Fluoroscopy Time:  30 seconds

Number of Acquired Images:  2
FINDINGS: Two spot films obtained reveal a right hip prosthesis in
satisfactory position. No acute bony or soft tissue abnormality is
noted.
IMPRESSION: Right hip replacement

## 2019-08-31 IMAGING — DX DG HIP (WITH OR WITHOUT PELVIS) 1V PORT*R*
1 series · 1 of 1 positions shown · non-contrast
Comparison: Intraoperative images 5 02/01/2018

CLINICAL DATA: Postop RIGHT hip replacement

EXAM:
DG HIP (WITH OR WITHOUT PELVIS) 1V PORT RIGHT

[pelvis ap]
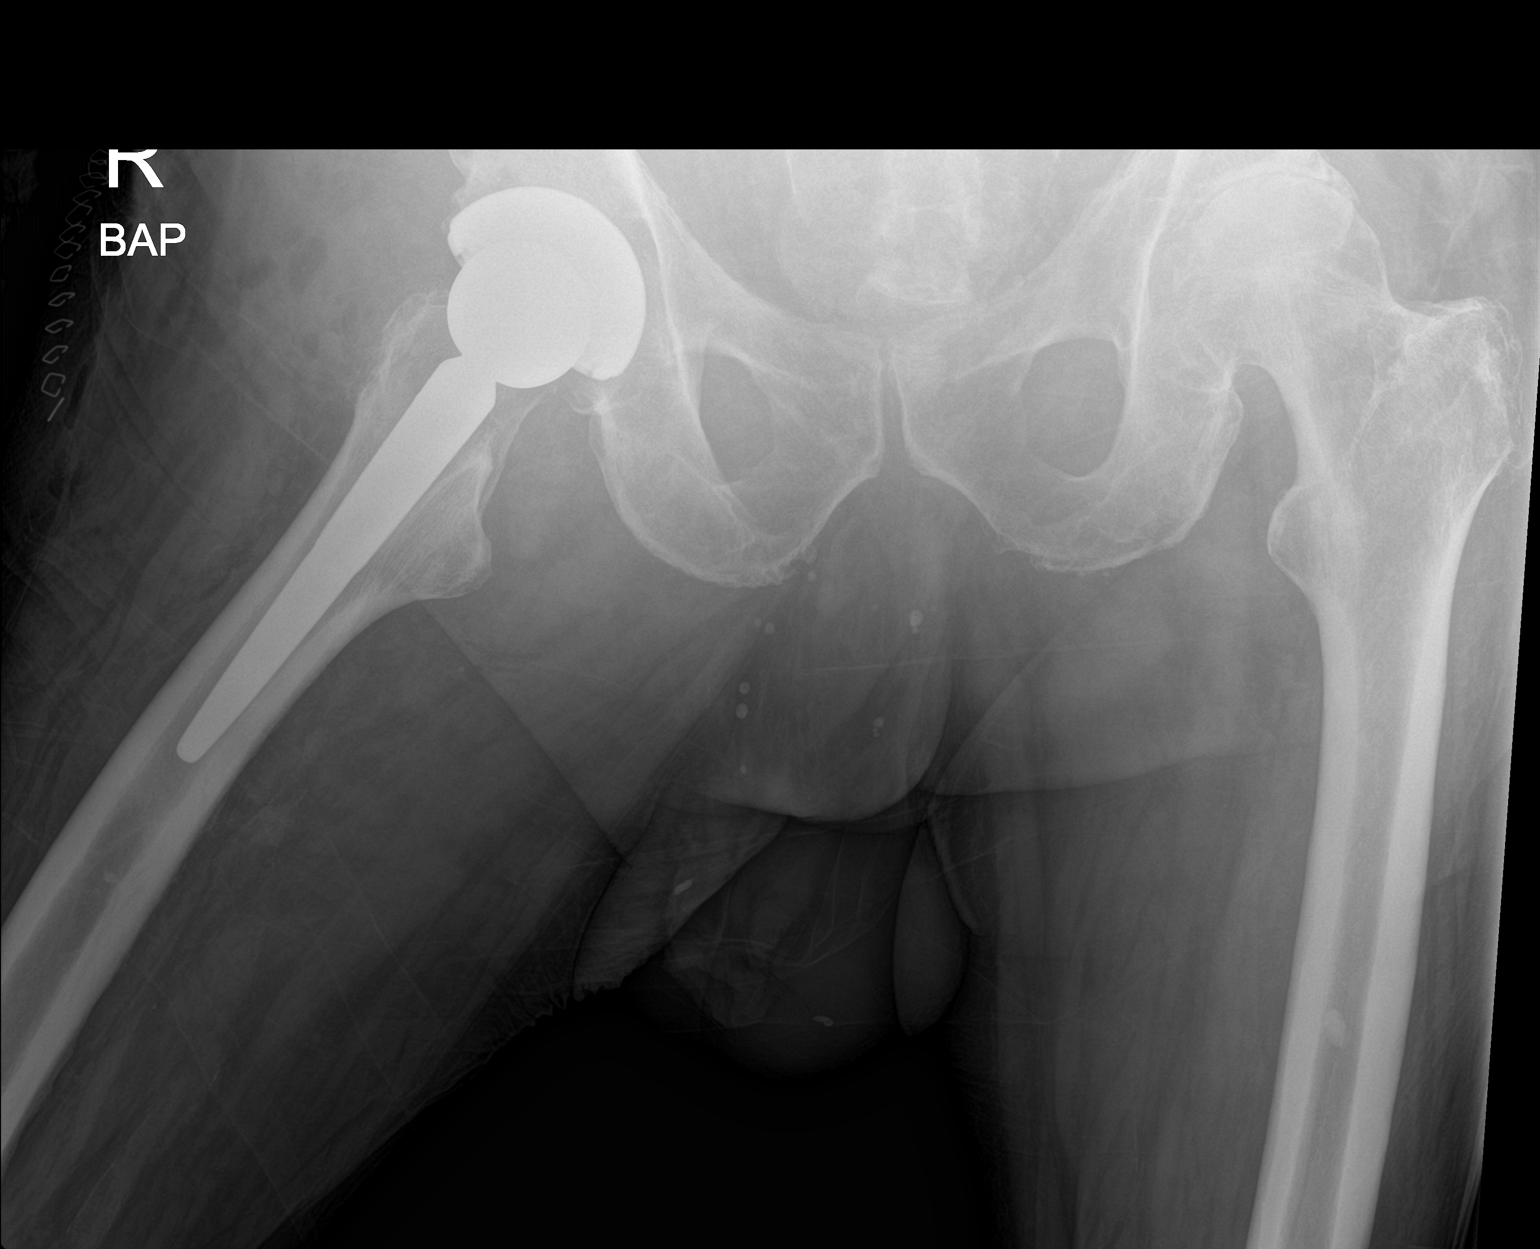

[1 of 1 positions shown; findings below may reference images not displayed]

FINDINGS: Osseous demineralization.

RIGHT hip prosthesis without fracture dislocation.

Degenerative changes of LEFT hip joint.

Visualized pelvis unremarkable.
IMPRESSION: RIGHT hip prosthesis without acute complication.

Osseous demineralization with degenerative changes LEFT hip joint.

## 2019-09-07 ENCOUNTER — Other Ambulatory Visit: Payer: Self-pay | Admitting: Internal Medicine

## 2019-09-07 DIAGNOSIS — F411 Generalized anxiety disorder: Secondary | ICD-10-CM

## 2019-09-08 NOTE — Progress Notes (Signed)
Assessment and Plan:   Essential hypertension - continue medications, DASH diet, exercise and monitor at home. Call if greater than 130/80.  -     CBC with Differential/Platelet -     BASIC METABOLIC PANEL WITH GFR -     Hepatic function panel -     TSH  Hyperlipidemia, mixed -continue medications, check lipids, decrease fatty foods, increase activity.  -     Lipid panel  Medication management -     Magnesium  Chronic atrial fibrillation (HCC) No on anticoag, understands risk stroke/death  ASHD (arteriosclerotic heart disease) Control blood pressure, cholesterol, glucose, increase exercise.   Depression, major, in remission ([place]) Continue meds, try cutting celexa in half and CBT counseling  Interstitial lung disease ([place]) Samples of breztri given, if not better may consider switching to nebulizer adding ipratropium/brovana.   Basal cell carcinoma (BCC) in situ of skin Schedule removal  Vitamin D deficiency -     Vitamin D (25 hydroxy)    Continue diet and meds as discussed. Further disposition pending results of labs.  HPI 85 y.o. male  presents for 3 month follow up with hypertension, hyperlipidemia, prediabetes and vitamin D.  He has been having productive cough x march. He went to UC at [facility] 11/05, had normal CXR other than some scarring. Has CT scan that shows unchanged fibrotic/intersitial lung disease changes. He states he has some SOB.    His blood pressure has been controlled at home, today their BP is BP: (!) 142/68.   He does not workout. He denies chest pain, shortness of breath, dizziness.  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 Negative stress test 2007.  He is on celexa at night.  Daughter and is here with him.   He is on cholesterol medication and denies myalgias. His cholesterol is at goal. The cholesterol last visit was:   Lab Results  Component Value Date   CHOL 182 02/26/2019   HDL 53  02/26/2019   LDLCALC 112 (H) 02/26/2019   TRIG 81 02/26/2019   CHOLHDL 3.4 02/26/2019    He has been working on diet and exercise for prediabetes, and denies foot ulcerations, hyperglycemia, hypoglycemia , increased appetite, nausea, paresthesia of the feet, polydipsia, polyuria, visual disturbances, vomiting and weight loss. Last A1C in the office was:  Lab Results  Component Value Date   HGBA1C 5.7 (H) 06/08/2019   Patient is on Vitamin D supplement.  Lab Results  Component Value Date   VD25OH 58 06/08/2019     BMI is Body mass index is 28.34 kg/m., he is working on diet and exercise. Wt Readings from Last 3 Encounters:  09/09/19 203 lb 3.2 oz (92.2 kg)  06/08/19 194 lb 1.6 oz (88 kg)  04/21/19 200 lb (90.7 kg)     Current Medications:  Current Outpatient Medications on File Prior to Visit  Medication Sig Dispense Refill  . aspirin EC 81 MG tablet Take 81 mg by mouth daily.    . Calcium Carbonate Antacid (TUMS E-X PO) Take 2 tablets by mouth daily.    . Cholecalciferol 5000 UNITS TABS Take 1 tablet by mouth daily.    . citalopram (CELEXA) 40 MG tablet Take 1 tablet Daily for Mood 90 tablet 3  . diltiazem (CARDIZEM) 60 MG tablet Take 1 tablet (60 mg total) by mouth 2 (two) times daily. 180 tablet 3  . finasteride (PROSCAR) 5 MG tablet Take 1 tablet daily for Prostate  90 tablet 3  . LORazepam (ATIVAN) 2 MG tablet Take 1/2-1 tablet at Bedtime  ONLY if needed for Sleep &  limit to 5 days /week to avoid addiction 30 tablet 0  . Magnesium 200 MG TABS Take 1 tablet by mouth daily.    . Multiple Vitamin (MULTIVITAMIN) capsule Take 1 capsule by mouth daily.    . tamsulosin (FLOMAX) 0.4 MG CAPS capsule Take 1 tablet at Bedtime for Prostate 90 capsule 3  . vitamin C (ASCORBIC ACID) 500 MG tablet Take 1,000 mg by mouth daily.     No current facility-administered medications on file prior to visit.     Medical History:  Past Medical History:  Diagnosis Date  . Arthritis   . ASHD  (arteriosclerotic heart disease)   . Atrial fib/flutter, transient    off coumadin since 11/2013- CHADSVAC of 2  . Cancer (HCC)    BASAL CELL   . Colon polyps    2003 hyperplastic and tubuler adenoma  . Depression   . Diverticulosis   . Dyslipidemia   . Dysrhythmia   . FHx: heart disease 08/11/2018  . History of kidney stones   . Hypertension   . OSA (obstructive sleep apnea)    NO CPAP IN 10+ YEARS  . Pre-diabetes   . Rectus sheath hematoma 11/21/2013   Dr. Deatra Ina   . 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 Gabapentin     Confusion   . Ciprofloxacin     dysphoria  . Novocain [Procaine Hcl]   . Prednisone     Nervousness     Review of Systems:  Review of Systems  Constitutional: Negative for chills and fever.  HENT: Negative for congestion, ear pain and sore throat.   Eyes: Negative.   Respiratory: Negative for cough, shortness of breath and wheezing.   Cardiovascular: Negative for chest pain, palpitations and leg swelling.  Gastrointestinal: Negative for blood in stool, constipation, diarrhea, heartburn, melena, nausea and vomiting.  Genitourinary: Negative.   Musculoskeletal: Positive for back pain, joint pain and myalgias. Negative for falls and neck pain.  Skin: Negative.   Neurological: Negative for dizziness and headaches.  Psychiatric/Behavioral: Negative for depression. The patient is nervous/anxious. The patient does not have insomnia.     Family history- Review and unchanged  Social history- Review and unchanged  Physical Exam: BP (!) 142/68   Pulse 72   Temp (!) 97 F (36.1 C)   Wt 203 lb 3.2 oz (92.2 kg)   SpO2 94%   BMI 28.34 kg/m  Wt Readings from Last 3 Encounters:  09/09/19 203 lb 3.2 oz (92.2 kg)  06/08/19 194 lb 1.6 oz (88 kg)  04/21/19 200 lb (90.7 kg)    General Appearance: Well nourished well developed, in no apparent distress. Eyes: PERRLA, EOMs, conjunctiva no  swelling or erythema ENT/Mouth: Ear canals normal without obstruction, swelling, erythma, discharge.  TMs normal bilaterally.  Oropharynx moist, clear, without exudate, or postoropharyngeal swelling. Neck: Supple, thyroid normal,no cervical adenopathy  Respiratory: Respiratory effort normal, Breath sounds clear with bilateral crackles without wheeze, or rale.  No retractions, no accessory usage. Cardio: RRR with no MRGs. Brisk peripheral pulses without edema.  Abdomen: Soft, + BS,  Non tender, no guarding, rebound, hernias, masses. Musculoskeletal: Full ROM, slow gait Skin: left arm with pearly nodule with telangiectasias and erythematous base Warm, dry without rashes, lesions, ecchymosis.  Neuro: Awake and oriented X 3, Cranial  nerves intact. Normal muscle tone, no cerebellar symptoms. Psych: Normal affect, Insight and Judgment appropriate.       Vicie Mutters, PA-C 11:53 AM [facility]

## 2019-09-09 ENCOUNTER — Other Ambulatory Visit: Payer: Self-pay

## 2019-09-09 ENCOUNTER — Ambulatory Visit (INDEPENDENT_AMBULATORY_CARE_PROVIDER_SITE_OTHER): Payer: Medicare Other | Admitting: Physician Assistant

## 2019-09-09 ENCOUNTER — Encounter: Payer: Self-pay | Admitting: Physician Assistant

## 2019-09-09 VITALS — BP 142/68 | HR 72 | Temp 97.0°F | Wt 203.2 lb

## 2019-09-09 DIAGNOSIS — F325 Major depressive disorder, single episode, in full remission: Secondary | ICD-10-CM | POA: Diagnosis not present

## 2019-09-09 DIAGNOSIS — I480 Paroxysmal atrial fibrillation: Secondary | ICD-10-CM | POA: Diagnosis not present

## 2019-09-09 DIAGNOSIS — Z79899 Other long term (current) drug therapy: Secondary | ICD-10-CM | POA: Diagnosis not present

## 2019-09-09 DIAGNOSIS — I10 Essential (primary) hypertension: Secondary | ICD-10-CM | POA: Diagnosis not present

## 2019-09-09 DIAGNOSIS — E782 Mixed hyperlipidemia: Secondary | ICD-10-CM | POA: Diagnosis not present

## 2019-09-09 DIAGNOSIS — I2510 Atherosclerotic heart disease of native coronary artery without angina pectoris: Secondary | ICD-10-CM

## 2019-09-09 DIAGNOSIS — R7309 Other abnormal glucose: Secondary | ICD-10-CM

## 2019-09-09 DIAGNOSIS — I2583 Coronary atherosclerosis due to lipid rich plaque: Secondary | ICD-10-CM

## 2019-09-09 DIAGNOSIS — F17200 Nicotine dependence, unspecified, uncomplicated: Secondary | ICD-10-CM

## 2019-09-09 DIAGNOSIS — E559 Vitamin D deficiency, unspecified: Secondary | ICD-10-CM | POA: Diagnosis not present

## 2019-09-09 DIAGNOSIS — J849 Interstitial pulmonary disease, unspecified: Secondary | ICD-10-CM

## 2019-09-09 DIAGNOSIS — D049 Carcinoma in situ of skin, unspecified: Secondary | ICD-10-CM

## 2019-09-09 NOTE — Patient Instructions (Addendum)
Suggest trying to cut the celexa in half to 20 mg and take at night Suggest talking with someone about depression, suggest doing cognitive behavioral therapy.  Get back on finestaride and continue the flomax at night   Persistent Depressive Disorder, Adult Persistent depressive disorder (PDD) is a mental health condition that causes symptoms of low-level depression for 2 years or longer. It may also be called long-term (chronic) depression or dysthymia. PDD may include episodes of more severe depression that last for about 2 weeks (major depressive disorder or MDD). PDD can affect the way you think, feel, and sleep. This condition may also affect your relationships. You may be more likely to get sick if you have PDD. What are the causes? The exact cause of this condition is not known. PDD is most likely caused by a combination of things, which may include:  Genetic factors. These are traits that are passed along from parent to child.  Individual factors. Your personality, your behavior, and the way you handle your thoughts and feelings may contribute to PDD. This includes personality traits and behaviors learned from others.  Physical factors, such as: ? Differences in the part of your brain that controls emotion. This part of your brain may be different than it is in people who do not have PDD. ? Long-term (chronic) medical or psychiatric illnesses.  Social factors. Traumatic experiences or major life changes may play a role in the development of PDD. What increases the risk? This condition is more likely to develop in women. The following factors may make you more likely to develop PDD:  A family history of depression.  Abnormally low levels of certain brain chemicals.  Traumatic events in childhood, especially abuse or the loss of a parent.  Being under a lot of stress, or long-term stress, especially from upsetting life experiences or losses.  A history of: ? Chronic physical  illness. ? Other mental health disorders. ? Substance abuse.  Poor living conditions.  Experiencing social exclusion or discrimination on a regular basis. What are the signs or symptoms? Symptoms of this condition occur for most of the day, and may include:  Fatigue or low energy.  Eating too much or too little.  Sleeping too much or too little.  Restlessness or agitation.  Feelings of hopelessness.  Feeling worthless or guilty.  Anxiety.  Poor concentration or difficulty making decisions.  Low self-esteem.  Negative outlook.  Inability to have fun or experience pleasure.  Social withdrawal.  Unexplained physical complaints.  Irritability.  Aggressive behavior or anger. How is this diagnosed? This condition may be diagnosed based on:  Your symptoms.  Your medical history, including your mental health history. This may involve tests to evaluate your mental health. You may be asked questions about your lifestyle, including any drug and alcohol use, and how long you have had symptoms of PDD.  A physical exam.  Blood tests to rule out other conditions. You may be diagnosed with PDD if you have had a depressed mood for 2 years or longer, as well as other symptoms of depression. How is this treated? This condition is usually treated by mental health professionals, such as psychologists, psychiatrists, and clinical social workers. You may need more than one type of treatment. Treatment may include:  Psychotherapy. This is also called talk therapy or counseling. Types of psychotherapy include: ? Cognitive behavioral therapy (CBT). This type of therapy teaches you to recognize unhealthy feelings, thoughts, and behaviors, and replace them with positive thoughts and  actions. ? Interpersonal therapy (IPT). This helps you to improve the way you relate to and communicate with others. ? Family therapy. This treatment includes members of your family.  Medicine to treat  anxiety and depression, or to help you control certain emotions and behaviors.  Lifestyle changes, such as: ? Limiting alcohol and drug use. ? Exercising regularly. ? Getting plenty of sleep. ? Making healthy eating choices. ? Spending more time outdoors.  Follow these instructions at home: Activity  Return to your normal activities as told by your health care provider.  Exercise regularly and spend time outdoors as told by your health care provider. General instructions  Take over-the-counter and prescription medicines only as told by your health care provider.  Do not drink alcohol. If you drink alcohol, limit your alcohol intake to no more than 1 drink a day for nonpregnant women and 2 drinks a day for men. One drink equals 12 oz of beer, 5 oz of wine, or 1 oz of hard liquor. Alcohol can affect any antidepressant medicines you are taking. Talk to your health care provider about your alcohol use.  Eat a healthy diet and get plenty of sleep.  Find activities that you enjoy doing, and make time to do them.  Consider joining a support group. Your health care provider may be able to recommend a support group.  Keep all follow-up visits as told by your health care provider. This is important. Where to find more information [employer] on Mental Illness  www.nami.org U.S. National Institute of Mental Health  https://carter.com/ National Suicide Prevention Lifeline  1-800-273-TALK 3315207970). This is free, 24-hour help. Contact a health care provider if:  Your symptoms get worse.  You develop new symptoms.  You have trouble sleeping or doing your daily activities. Get help right away if:  You self-harm.  You have serious thoughts about hurting yourself or others.  You see, hear, taste, smell, or feel things that are not present (hallucinate). This information is not intended to replace advice given to you by your health care provider. Make sure you discuss any questions  you have with your health care provider. Document Released: 09/17/2012 Document Revised: 09/13/2017 Document Reviewed: 04/14/2016 Elsevier Patient Education  [place] Carcinoma Basal cell carcinoma is the most common form of skin cancer. It begins in the basal cells, which are at the bottom of the outer skin layer (epidermis). Basal cell carcinoma can almost always be cured. It rarely spreads to other areas of the body (metastasizes). It may come back at the same location (recur), but it can be treated again if this happens. Basal cell carcinoma occurs most often on parts of the body that are frequently exposed to the sun, such as:  Parts of the head, including the scalp or face.  Ears.  Neck.  Arms or legs.  Backs of the hands. What are the causes? This condition is usually caused by exposure to ultraviolet (UV) light. UV light may come from the sun or from tanning beds. Other causes include:  Exposure to a highly poisonous metal (arsenic).  Exposure to high-energy X-rays (radiation).  Exposure to toxic tars and oils.  Certain genetic conditions, such as a condition that makes a person sensitive to sunlight (xeroderma pigmentosum). What increases the risk? You are more likely to develop this condition if:  You are older than 83 years of age.  You have: ? Fair skin (light complexion). ? Blond or red hair. ? Blue,  green, or gray eyes. ? Childhood freckling. ? Had sun exposure over long periods of time, especially during childhood. ? Had repeated sunburns. ? A weakened immune system. ? Been exposed to certain chemicals, such as tar, soot, and arsenic. ? Chronic inflammatory conditions. ? Chronic infections.  You use tanning beds. What are the signs or symptoms? The main symptom of this condition is a growth or lesion on the skin.  The shape and color of the growth or lesion may vary. The main types include: ? An open sore that may remain open  for 3 weeks or longer. The sore may bleed or crust. This type of lesion can be an early sign of basal cell carcinoma. Basal cell carcinoma often shows up as a sore that does not heal. ? A reddish area that may crust, itch, or cause discomfort. This may occur on areas that are exposed to the sun. These patches might be easier to feel than to see. ? A shiny or clear bump that is red, white, or pink. In people who have dark hair, the bump is often tan, black, or brown. These bumps can look like moles. ? A pink growth with a raised border. The growth will have a crusted and indented area in the center. Small blood vessels may appear on the surface of the growth as it gets bigger. ? A scar-like area that looks like shiny, stretched skin. The area may be white, yellow, or waxy. It often has irregular borders. This may be a sign of more aggressive basal cell carcinoma. How is this diagnosed? This condition may be diagnosed with:  A physical exam.  Removal of a tissue sample to be examined under a microscope (biopsy). How is this treated? Treatment for this condition involves removing the cancerous tissue. The method that is used for this depends on the type, size, location, and number of tumors. Possible treatments include:  Mohs surgery. In this procedure, the cancerous skin cells are removed layer by layer until all of the tumor has been removed.  Surgical removal (excision) of the tumor. This involves removing the entire tumor and a small amount of normal skin that surrounds it.  Cryosurgery. This involves freezing the tumor with liquid nitrogen.  Plastic surgery. The tumor is removed, and healthy skin from another part of the body is used to cover the wound. This may be done for large tumors that are in areas where it is not possible to stretch the nearby skin to sew the edges of the wound together.  Radiation. This may be used for tumors on the face.  Photodynamic therapy. A chemical cream is  applied to the skin, and light exposure is used to activate the chemical.  Electrodesiccation and curettage. This involves alternately scraping and burning the tumor while using an electric current to control bleeding.  Chemical treatments, such as imiquimod cream and interferon injections. These may be used to remove superficial tumors with minimal scarring. Follow these instructions at home:  Avoid direct exposure to the sun.  Do self-exams as told by your health care provider. Look for new spots or changes in your skin.  Keep all follow-up visits as told by your health care provider. This is important. How is this prevented?   Avoid the sun when it is the strongest. This is usually between 10 a.m. and 4 p.m.  When you are out in the sun, use a sunscreen that has a sun protection factor (SPF) of at least 53.  Apply  sunscreen at least 30 minutes before exposure to the sun.  Reapply sunscreen every 2-4 hours while you are outside. Also reapply it after swimming and after excessive sweating.  Always wear hats, protective clothing, and UV-blocking sunglasses when you are outdoors.  Do not use tanning beds. Contact a health care provider if you:  Notice any new spots or any changes in your skin.  Have had a basal cell carcinoma tumor removed, and you notice a new growth in the same location. Get help right away if you have a spot that:  Is sore and does not heal.  Bleeds easily with minor injury. Summary  Basal cell carcinoma is the most common form of skin cancer. It begins in the bottom of the outer skin layer (epidermis). Basal cell carcinoma can almost always be cured.  This condition is usually caused by exposure to ultraviolet (UV) light. It mostly affects the face, scalp, neck, ears, arms, legs, or backs of the hands.  The main symptom of this condition is a growth or lesion on the skin that can vary in shape and color.  You can prevent this cancer by avoiding direct  exposure to the sun, applying sunscreen of at least 30 SPF, and wearing protective clothing.  Apply sunscreen 30 minutes before you go out into the sun, and reapply every 2-4 hours while you are outside. This information is not intended to replace advice given to you by your health care provider. Make sure you discuss any questions you have with your health care provider. Document Released: 04/07/2003 Document Revised: 02/19/2018 Document Reviewed: 02/19/2018 Elsevier Patient Education  2020 [employer].

## 2019-09-10 LAB — LIPID PANEL
Cholesterol: 211 mg/dL — ABNORMAL HIGH (ref <200)
HDL: 51 mg/dL (ref >=40)
LDL Cholesterol (Calc): 133 mg/dL (calc) — ABNORMAL HIGH
Non-HDL Cholesterol (Calc): 160 mg/dL (calc) — ABNORMAL HIGH (ref <130)
Total CHOL/HDL Ratio: 4.1 (calc) (ref <5.0)
Triglycerides: 141 mg/dL (ref <150)

## 2019-09-10 LAB — CBC WITH DIFFERENTIAL/PLATELET
Absolute Monocytes: 631 cells/uL (ref 200–950)
Basophils Absolute: 31 cells/uL (ref 0–200)
Basophils Relative: 0.4 %
Eosinophils Absolute: 193 cells/uL (ref 15–500)
Eosinophils Relative: 2.5 %
HCT: 47.5 % (ref 38.5–50.0)
Hemoglobin: 16.6 g/dL (ref 13.2–17.1)
Lymphs Abs: 1594 cells/uL (ref 850–3900)
MCH: 32.4 pg (ref 27.0–33.0)
MCHC: 34.9 g/dL (ref 32.0–36.0)
MCV: 92.6 fL (ref 80.0–100.0)
MPV: 9.2 fL (ref 7.5–12.5)
Monocytes Relative: 8.2 %
Neutro Abs: 5251 cells/uL (ref 1500–7800)
Neutrophils Relative %: 68.2 %
Platelets: 220 10*3/uL (ref 140–400)
RBC: 5.13 10*6/uL (ref 4.20–5.80)
RDW: 12.4 % (ref 11.0–15.0)
Total Lymphocyte: 20.7 %
WBC: 7.7 10*3/uL (ref 3.8–10.8)

## 2019-09-10 LAB — COMPLETE METABOLIC PANEL WITH GFR
AG Ratio: 2 (calc) (ref 1.0–2.5)
ALT: 9 U/L (ref 9–46)
AST: 15 U/L (ref 10–35)
Albumin: 4.1 g/dL (ref 3.6–5.1)
Alkaline phosphatase (APISO): 58 U/L (ref 35–144)
BUN: 11 mg/dL (ref 7–25)
CO2: 28 mmol/L (ref 20–32)
Calcium: 10 mg/dL (ref 8.6–10.3)
Chloride: 102 mmol/L (ref 98–110)
Creat: 0.76 mg/dL (ref 0.70–1.11)
GFR, Est African American: 93 mL/min/{1.73_m2} (ref >=60)
GFR, Est Non African American: 80 mL/min/{1.73_m2} (ref >=60)
Globulin: 2.1 g/dL (calc) (ref 1.9–3.7)
Glucose, Bld: 98 mg/dL (ref 65–99)
Potassium: 4 mmol/L (ref 3.5–5.3)
Sodium: 138 mmol/L (ref 135–146)
Total Bilirubin: 0.6 mg/dL (ref 0.2–1.2)
Total Protein: 6.2 g/dL (ref 6.1–8.1)

## 2019-09-10 LAB — TSH: TSH: 1.78 mIU/L (ref 0.40–4.50)

## 2019-09-10 LAB — MAGNESIUM: Magnesium: 1.9 mg/dL (ref 1.5–2.5)

## 2019-09-10 LAB — VITAMIN D 25 HYDROXY (VIT D DEFICIENCY, FRACTURES): Vit D, 25-Hydroxy: 73 ng/mL (ref 30–100)

## 2019-09-23 ENCOUNTER — Encounter: Payer: Medicare Other | Admitting: Internal Medicine

## 2019-10-01 ENCOUNTER — Ambulatory Visit (INDEPENDENT_AMBULATORY_CARE_PROVIDER_SITE_OTHER): Payer: Medicare Other | Admitting: Internal Medicine

## 2019-10-01 ENCOUNTER — Other Ambulatory Visit: Payer: Self-pay

## 2019-10-01 ENCOUNTER — Encounter: Payer: Self-pay | Admitting: Internal Medicine

## 2019-10-01 VITALS — BP 124/80 | HR 76 | Temp 97.8°F | Resp 16 | Ht 71.0 in | Wt 203.4 lb

## 2019-10-01 DIAGNOSIS — L723 Sebaceous cyst: Secondary | ICD-10-CM | POA: Diagnosis not present

## 2019-10-01 DIAGNOSIS — I10 Essential (primary) hypertension: Secondary | ICD-10-CM

## 2019-10-01 NOTE — Progress Notes (Signed)
Subjective:    Patient ID: [name] Yu, male    DOB: [birth year], 85 y.o.   MRN: [id number]  HPI     This very nice 85 yo MWM with HTN & ASHD present for recheck. He denies HA's , postural dizziness, CP, palpitations, dyspnea or dependent edema. He's also concerned re: an irritated area in the skin of his Left shoulder.   Medication Sig  . aspirin EC 81 MG tablet Take 81 mg by mouth daily.  . Calcium Carbonate Antacid (TUMS E-X PO) Take 2 tablets by mouth daily.  . Cholecalciferol 5000 UNITS TABS Take 1 tablet by mouth daily.  . citalopram  40 MG Take 1 tablet Daily for Mood  . diltiazem 60 MG  Take 1 tablet  2  times daily.  . finasteride 5 MG Take 1 tablet daily for Prostate  . LORazepam  2 MG  Take 1/2-1 tablet at Bedtime  ONLY if needed   . Magnesium 200 MG  Take 1 tablet  daily.  . Multiple Vitamin Take 1 capsule  daily.  . tamsulosin  0.4 MG  Take 1 tablet at Bedtime for Prostate  . vitamin C  500 MG tablet Take 1,000 mg  daily.   No facility-administered medications prior to visit.   Allergies  Allergen Reactions  . Gabapentin     Confusion   . Ciprofloxacin     dysphoria  . Novocain [Procaine Hcl]   . Prednisone     Nervousness   Past Medical History:  Diagnosis Date  . Arthritis   . ASHD (arteriosclerotic heart disease)   . Atrial fib/flutter, transient    off coumadin since 11/2013- CHADSVAC of 2  . Cancer (HCC)    BASAL CELL   . Colon polyps    2003 hyperplastic and tubuler adenoma  . Depression   . Diverticulosis   . Dyslipidemia   . Dysrhythmia   . FHx: heart disease 08/11/2018  . History of kidney stones   . Hypertension   . OSA (obstructive sleep apnea)    NO CPAP IN 10+ YEARS  . Pre-diabetes   . Rectus sheath hematoma 11/21/2013   Dr. Deatra Ina   . 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 CATARACT EXTRACTION, BILATERAL Bilateral 2019   at the [facility]    . PROSTATE    . TOTAL HIP ARTHROPLASTY Right 03/07/2018   Procedure: RIGHT TOTAL HIP ARTHROPLASTY DIRECT ANTERIOR;  Surgeon: [name], MD;  Location: [facility];  Service: Orthopedics;  Laterality: Right;   Review of Systems   10 point systems review negative except as above.    Objective:   Physical Exam  BP 124/80   Pulse 76   Temp 97.8 F (36.6 C)   Resp 16   Ht 5\' 11"  (1.803 m)   Wt 203 lb 6.4 oz (92.3 kg)   BMI 28.37 kg/m   HEENT - WNL. Neck - supple.  Chest - Clear equal BS. Cor - Nl HS. RRR w/o sig MGR. PP 1(+). No edema. MS- FROM w/o deformities.  Gait Nl. Neuro -  Nl w/o focal abnormalities. Skin - there is a firm 5 mm subcutaneous firm mobile mass of the Left anterior shoulder.   Procedure (CPT - 11400)      After informed consent and aseptic prep with alcohol and local anesthesia with 1 ml Marcaine 0.5% to  the area above. Then with a #10 scalpel,  a 5 mm incision was created to reveal a  firm 5-6 mm glistening white firm lesion sharply dissected free and delivered. Then the wound was hyfrecated fr hemostasis and steri strips applied. Then covered with a 2" x 3" Tegaderm. Patient was instructed in P/O wound care    Assessment & Plan:    1. Essential hypertension  Controlled  2. Sebaceous cyst  - excised

## 2019-10-13 ENCOUNTER — Other Ambulatory Visit: Payer: Self-pay | Admitting: Internal Medicine

## 2019-10-13 DIAGNOSIS — F411 Generalized anxiety disorder: Secondary | ICD-10-CM

## 2019-10-25 ENCOUNTER — Other Ambulatory Visit: Payer: Self-pay | Admitting: Internal Medicine

## 2019-10-25 DIAGNOSIS — N400 Enlarged prostate without lower urinary tract symptoms: Secondary | ICD-10-CM

## 2019-12-16 ENCOUNTER — Other Ambulatory Visit: Payer: Self-pay | Admitting: Internal Medicine

## 2019-12-16 ENCOUNTER — Encounter: Payer: Self-pay | Admitting: Internal Medicine

## 2019-12-16 DIAGNOSIS — F411 Generalized anxiety disorder: Secondary | ICD-10-CM

## 2019-12-16 MED ORDER — LORAZEPAM 2 MG PO TABS: ORAL_TABLET | ORAL | 0 refills | Status: DC

## 2020-01-10 ENCOUNTER — Encounter: Payer: Self-pay | Admitting: Internal Medicine

## 2020-01-10 NOTE — Patient Instructions (Signed)

## 2020-01-10 NOTE — Progress Notes (Signed)
Annual  Screening/Preventative Visit  & Comprehensive Evaluation & Examination     This very nice 86 y.o.  MWM  presents for a Screening /Preventative Visit & comprehensive evaluation and management of multiple medical co-morbidities.  Patient has been followed for HTN, HLD, Prediabetes and Vitamin D Deficiency.     HTN predates circa 2005. Patient's BP has been controlled at home.  Today's BP  is at goal - 122/76. In 2007 patient was dx'd with  PAfib and started on Coumadin til he developed a spontaneous large rectus sheath  Abdominal hematoma & coumadin was stopped by his [place] cardiologist. patient had a negative Heart Cath in 2003 and  negative Cardiolite's x 2 in 2005 & 2007.  Patient denies any cardiac symptoms as chest pain, palpitations, shortness of breath, dizziness or ankle swelling.     Patient's hyperlipidemia is not controlled with diet and not treated aggressively for age consideration.   Lab Results  Component Value Date   CHOL 211 (H) 09/09/2019   HDL 51 09/09/2019   LDLCALC 133 (H) 09/09/2019   TRIG 141 09/09/2019   CHOLHDL 4.1 09/09/2019       Patient has hx/o prediabetes (A1c 5.7% / 2014)  and patient denies reactive hypoglycemic symptoms, visual blurring, diabetic polys or paresthesias. Last A1c was near goal:  Lab Results  Component Value Date   HGBA1C 5.7 (H) 06/08/2019        Finally, patient has history of Vitamin D Deficiency ("33" / 2008)  and last vitamin D was at goal:  Lab Results  Component Value Date   VD25OH 73 09/09/2019    Current Outpatient Medications on File Prior to Visit  Medication Sig  . aspirin EC 81 MG tablet Take 81 mg by mouth daily.  . Calcium Carbonate Antacid (TUMS E-X PO) Take 2 tablets by mouth daily.  . Cholecalciferol 5000 UNITS TABS Take 1 tablet by mouth daily.  . citalopram (CELEXA) 40 MG tablet Take 1 tablet Daily for Mood  . diltiazem (CARDIZEM) 60 MG tablet Take 1 tablet (60 mg total) by mouth 2 (two) times daily.  .  finasteride (PROSCAR) 5 MG tablet Take 1 tablet Daily for Prostate  . LORazepam (ATIVAN) 2 MG tablet Take 1/2 - 1 tablet at Bedtime ONLY if needed forSleep &  limit to 5 days /week to avoid Addiction & Dementia  . Magnesium 200 MG TABS Take 1 tablet by mouth daily.  . Multiple Vitamin (MULTIVITAMIN) capsule Take 1 capsule by mouth daily.  . tamsulosin (FLOMAX) 0.4 MG CAPS capsule Take 1 tablet at Bedtime for Prostate  . vitamin C (ASCORBIC ACID) 500 MG tablet Take 1,000 mg by mouth daily.   No current facility-administered medications on file prior to visit.   Allergies  Allergen Reactions  . Gabapentin     Confusion   . Ciprofloxacin     dysphoria  . Novocain [Procaine Hcl]   . Prednisone     Nervousness   Past Medical History:  Diagnosis Date  . Arthritis   . ASHD (arteriosclerotic heart disease)   . Atrial fib/flutter, transient    off coumadin since 11/2013- CHADSVAC of 2  . Cancer (HCC)    BASAL CELL   . Colon polyps    2003 hyperplastic and tubuler adenoma  . Depression   . Diverticulosis   . Dyslipidemia   . Dysrhythmia   . FHx: heart disease 08/11/2018  . History of kidney stones   . Hypertension   . OSA (  obstructive sleep apnea)    NO CPAP IN 10+ YEARS  . Pre-diabetes   . Rectus sheath hematoma 11/21/2013   Dr. Deatra Ina   . 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 TETANUS/TDAP  12/16/2026  . INFLUENZA VACCINE  Completed  . PNA vac Low Risk Adult  Completed   Immunization History  Administered Date(s) Administered  . DT (Pediatric) 07/06/2014  . Influenza Split 07/20/2014, 07/18/2016  . Influenza, High Dose Seasonal PF 08/22/2015, 07/15/2017  . Influenza-Unspecified 06/15/2018, 08/04/2019  . Pneumococcal Conjugate-13 07/06/2014  . Pneumococcal Polysaccharide-23 06/09/2013  . Td 12/15/2016  . Zoster 10/15/2005   Last Colon - 03/11/2002 - Dr Deatra Ina recc f/u 10 year - patient is Overdue & has since  aged-out.  Past Surgical History:  Procedure Laterality Date  . CATARACT EXTRACTION, BILATERAL Bilateral 2019   at the [facility]    . PROSTATE    . TOTAL HIP ARTHROPLASTY Right 03/07/2018   Procedure: RIGHT TOTAL HIP ARTHROPLASTY DIRECT ANTERIOR;  Surgeon: [name], MD;  Location: [facility];  Service: Orthopedics;  Laterality: Right;   Family History  Problem Relation Age of Onset  . Alzheimer's disease Mother   . Kidney cancer Father   . Diabetes Brother   . Breast cancer Sister   . Rectal cancer Sister   . Breast cancer Daughter   . Ulcerative colitis Sister   . Heart disease Brother   . Heart disease Sister    Social History   Socioeconomic History  . Marital status: Married    Spouse name: Collie Siad  . Number of children: 5  Occupational History  . 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 Smoking status: Former Smoker    Quit date: 09/14/1993    Years since quitting: 26.3  . Smokeless tobacco: Never Used  Substance and Sexual Activity  . Alcohol use: No  . Drug use: No  . Sexual activity: No    ROS Constitutional: Denies fever, chills, weight loss/gain, headaches, insomnia,  night sweats or change in appetite. Does c/o fatigue. Eyes: Denies redness, blurred vision, diplopia, discharge, itchy or watery eyes.  ENT: Denies discharge, congestion, post nasal drip, epistaxis, sore throat, earache, hearing loss, dental pain, Tinnitus, Vertigo, Sinus pain or snoring.  Cardio: Denies chest pain, palpitations, irregular heartbeat, syncope, dyspnea, diaphoresis, orthopnea, PND, claudication or edema Respiratory: denies cough, dyspnea, DOE, pleurisy, hoarseness, laryngitis or wheezing.  Gastrointestinal: Denies dysphagia, heartburn, reflux, water brash, pain, cramps, nausea, vomiting, bloating, diarrhea, constipation, hematemesis, melena, hematochezia, jaundice or hemorrhoids Genitourinary: Denies dysuria, frequency, urgency,  nocturia, hesitancy, discharge, hematuria or flank pain Musculoskeletal: Denies arthralgia, myalgia, stiffness, Jt. Swelling, pain, limp or strain/sprain. Denies Falls. Skin: Denies puritis, rash, hives, warts, acne, eczema or change in skin lesion Neuro: No weakness, tremor, incoordination, spasms, paresthesia or pain Psychiatric: Denies confusion, memory loss or sensory loss. Denies Depression. Endocrine: Denies change in weight, skin, hair change, nocturia, and paresthesia, diabetic polys, visual blurring or hyper / hypo glycemic episodes.  Heme/Lymph: No excessive bleeding, bruising or enlarged lymph nodes.  Physical Exam  BP 122/76   Pulse 76   Temp (!) 97 F (36.1 C)   Resp 16   Ht 5' 10.5" (1.791 m)   Wt 202 lb 9.6 oz (91.9 kg)   BMI 28.66 kg/m   General Appearance: Well nourished and well groomed and in no apparent  distress.  Eyes: PERRLA, EOMs, conjunctiva no swelling or erythema, normal fundi and vessels. Sinuses: No frontal/maxillary tenderness ENT/Mouth: EACs patent / TMs  nl. Nares clear without erythema, swelling, mucoid exudates. Oral hygiene is good. No erythema, swelling, or exudate. Tongue normal, non-obstructing. Tonsils not swollen or erythematous. Hearing normal.  Neck: Supple, thyroid not palpable. No bruits, nodes or JVD. Respiratory: Respiratory effort normal.  BS equal and clear bilateral without rales, rhonci, wheezing or stridor. Cardio: Heart sounds are normal with regular rate and rhythm and no murmurs, rubs or gallops. Peripheral pulses are normal and equal bilaterally without edema. No aortic or femoral bruits. Chest: symmetric with normal excursions and percussion.  Abdomen: Soft, with Nl bowel sounds. Nontender, no guarding, rebound, hernias, masses, or organomegaly.  Lymphatics: Non tender without lymphadenopathy.  Musculoskeletal: Full ROM all peripheral extremities, joint stability, 5/5 strength, and normal gait. Skin: Warm and dry without rashes,  lesions, cyanosis, clubbing or  ecchymosis.  Neuro: Cranial nerves intact, reflexes equal bilaterally. Normal muscle tone, no cerebellar symptoms. Sensation intact.  Pysch: Alert and oriented X 3 with normal affect, insight and judgment appropriate.   Assessment and Plan  1. Annual Preventative/Screening Exam   2. Essential hypertension  - EKG 12-Lead - [place], RETROPERITNL ABD,  LTD - Urinalysis, Routine w reflex microscopic - Microalbumin / creatinine urine ratio - CBC with Differential/Platelet - COMPLETE METABOLIC PANEL WITH GFR - Magnesium - TSH  3. Hyperlipidemia, mixed  - EKG 12-Lead - [place], RETROPERITNL ABD,  LTD - TSH  4. Abnormal glucose  - EKG 12-Lead - [place], RETROPERITNL ABD,  LTD - Hemoglobin A1c - Insulin, random  5. Vitamin D deficiency  - VITAMIN D 25 Hydroxy  6. Paroxysmal atrial fibrillation (HCC)  - EKG 12-Lead  7. ASHD (arteriosclerotic heart disease)  - EKG 12-Lead  8. Benign localized prostatic hyperplasia with  lower urinary tract symptoms (LUTS)  - PSA  9. Prostate cancer screening  - PSA  10. Screening for colorectal cancer  - POC Hemoccult Bld/Stl  11. Screening for ischemic heart disease  - EKG 12-Lead  12. FHx: heart disease  - EKG 12-Lead - [place], RETROPERITNL ABD,  LTD  13. Former smoker  - EKG 12-Lead - [place], RETROPERITNL ABD,  LTD  14. Screening for AAA (aortic abdominal aneurysm)  - [place], RETROPERITNL ABD,  LTD  15. Medication management  - Urinalysis, Routine w reflex microscopic - Microalbumin / creatinine urine ratio - CBC with Differential/Platelet - COMPLETE METABOLIC PANEL WITH GFR - Magnesium - TSH - Hemoglobin A1c - Insulin, random - VITAMIN D 25 Hydroxy         Patient was counseled in prudent diet, weight control to achieve/maintain BMI less than 25, BP monitoring, regular exercise and medications as discussed.  Discussed med effects and SE's. Routine screening labs and tests as requested with regular  follow-up as recommended. Over 40 minutes of exam, counseling, chart review and high complex critical decision making was performed   Kirtland Bouchard, MD

## 2020-01-11 ENCOUNTER — Other Ambulatory Visit: Payer: Self-pay

## 2020-01-11 ENCOUNTER — Ambulatory Visit (INDEPENDENT_AMBULATORY_CARE_PROVIDER_SITE_OTHER): Payer: Medicare Other | Admitting: Internal Medicine

## 2020-01-11 VITALS — BP 122/76 | HR 76 | Temp 97.0°F | Resp 16 | Ht 70.5 in | Wt 202.6 lb

## 2020-01-11 DIAGNOSIS — Z136 Encounter for screening for cardiovascular disorders: Secondary | ICD-10-CM

## 2020-01-11 DIAGNOSIS — Z87891 Personal history of nicotine dependence: Secondary | ICD-10-CM | POA: Diagnosis not present

## 2020-01-11 DIAGNOSIS — Z1211 Encounter for screening for malignant neoplasm of colon: Secondary | ICD-10-CM

## 2020-01-11 DIAGNOSIS — Z79899 Other long term (current) drug therapy: Secondary | ICD-10-CM

## 2020-01-11 DIAGNOSIS — Z8249 Family history of ischemic heart disease and other diseases of the circulatory system: Secondary | ICD-10-CM | POA: Diagnosis not present

## 2020-01-11 DIAGNOSIS — F17200 Nicotine dependence, unspecified, uncomplicated: Secondary | ICD-10-CM

## 2020-01-11 DIAGNOSIS — E559 Vitamin D deficiency, unspecified: Secondary | ICD-10-CM

## 2020-01-11 DIAGNOSIS — R7309 Other abnormal glucose: Secondary | ICD-10-CM | POA: Diagnosis not present

## 2020-01-11 DIAGNOSIS — Z0001 Encounter for general adult medical examination with abnormal findings: Secondary | ICD-10-CM

## 2020-01-11 DIAGNOSIS — Z0000 Encounter for general adult medical examination without abnormal findings: Secondary | ICD-10-CM | POA: Diagnosis not present

## 2020-01-11 DIAGNOSIS — I480 Paroxysmal atrial fibrillation: Secondary | ICD-10-CM

## 2020-01-11 DIAGNOSIS — I2510 Atherosclerotic heart disease of native coronary artery without angina pectoris: Secondary | ICD-10-CM | POA: Diagnosis not present

## 2020-01-11 DIAGNOSIS — I10 Essential (primary) hypertension: Secondary | ICD-10-CM

## 2020-01-11 DIAGNOSIS — N401 Enlarged prostate with lower urinary tract symptoms: Secondary | ICD-10-CM

## 2020-01-11 DIAGNOSIS — Z125 Encounter for screening for malignant neoplasm of prostate: Secondary | ICD-10-CM

## 2020-01-11 DIAGNOSIS — E782 Mixed hyperlipidemia: Secondary | ICD-10-CM | POA: Diagnosis not present

## 2020-01-12 ENCOUNTER — Other Ambulatory Visit: Payer: Self-pay | Admitting: Internal Medicine

## 2020-01-12 DIAGNOSIS — N401 Enlarged prostate with lower urinary tract symptoms: Secondary | ICD-10-CM

## 2020-01-12 DIAGNOSIS — R972 Elevated prostate specific antigen [PSA]: Secondary | ICD-10-CM

## 2020-01-12 LAB — COMPLETE METABOLIC PANEL WITH GFR
AG Ratio: 1.7 (calc) (ref 1.0–2.5)
ALT: 10 U/L (ref 9–46)
AST: 13 U/L (ref 10–35)
Albumin: 3.8 g/dL (ref 3.6–5.1)
Alkaline phosphatase (APISO): 57 U/L (ref 35–144)
BUN: 8 mg/dL (ref 7–25)
CO2: 28 mmol/L (ref 20–32)
Calcium: 9.6 mg/dL (ref 8.6–10.3)
Chloride: 105 mmol/L (ref 98–110)
Creat: 0.82 mg/dL (ref 0.70–1.11)
GFR, Est African American: 90 mL/min/{1.73_m2} (ref >=60)
GFR, Est Non African American: 77 mL/min/{1.73_m2} (ref >=60)
Globulin: 2.3 g/dL (calc) (ref 1.9–3.7)
Glucose, Bld: 84 mg/dL (ref 65–99)
Potassium: 3.9 mmol/L (ref 3.5–5.3)
Sodium: 141 mmol/L (ref 135–146)
Total Bilirubin: 0.4 mg/dL (ref 0.2–1.2)
Total Protein: 6.1 g/dL (ref 6.1–8.1)

## 2020-01-12 LAB — HEMOGLOBIN A1C
Hgb A1c MFr Bld: 5.5 % of total Hgb (ref <5.7)
Mean Plasma Glucose: 111 (calc)
eAG (mmol/L): 6.2 (calc)

## 2020-01-12 LAB — URINALYSIS, ROUTINE W REFLEX MICROSCOPIC
Bacteria, UA: NONE SEEN /HPF
Bilirubin Urine: NEGATIVE
Glucose, UA: NEGATIVE
Hgb urine dipstick: NEGATIVE
Hyaline Cast: NONE SEEN /LPF
Ketones, ur: NEGATIVE
Nitrite: NEGATIVE
Protein, ur: NEGATIVE
Specific Gravity, Urine: 1.015 (ref 1.001–1.03)
Squamous Epithelial / HPF: NONE SEEN /HPF
pH: 6.5 (ref 5.0–8.0)

## 2020-01-12 LAB — MICROALBUMIN / CREATININE URINE RATIO
Creatinine, Urine: 131 mg/dL (ref 20–320)
Microalb Creat Ratio: 24 mcg/mg creat (ref <30)
Microalb, Ur: 3.2 mg/dL

## 2020-01-12 LAB — CBC WITH DIFFERENTIAL/PLATELET
Absolute Monocytes: 729 cells/uL (ref 200–950)
Basophils Absolute: 16 cells/uL (ref 0–200)
Basophils Relative: 0.2 %
Eosinophils Absolute: 227 cells/uL (ref 15–500)
Eosinophils Relative: 2.8 %
HCT: 48 % (ref 38.5–50.0)
Hemoglobin: 16.5 g/dL (ref 13.2–17.1)
Lymphs Abs: 1831 cells/uL (ref 850–3900)
MCH: 32.5 pg (ref 27.0–33.0)
MCHC: 34.4 g/dL (ref 32.0–36.0)
MCV: 94.7 fL (ref 80.0–100.0)
MPV: 9.4 fL (ref 7.5–12.5)
Monocytes Relative: 9 %
Neutro Abs: 5297 cells/uL (ref 1500–7800)
Neutrophils Relative %: 65.4 %
Platelets: 216 10*3/uL (ref 140–400)
RBC: 5.07 10*6/uL (ref 4.20–5.80)
RDW: 12.5 % (ref 11.0–15.0)
Total Lymphocyte: 22.6 %
WBC: 8.1 10*3/uL (ref 3.8–10.8)

## 2020-01-12 LAB — VITAMIN D 25 HYDROXY (VIT D DEFICIENCY, FRACTURES): Vit D, 25-Hydroxy: 68 ng/mL (ref 30–100)

## 2020-01-12 LAB — INSULIN, RANDOM: Insulin: 5.8 u[IU]/mL

## 2020-01-12 LAB — MAGNESIUM: Magnesium: 2 mg/dL (ref 1.5–2.5)

## 2020-01-12 LAB — PSA: PSA: 4.8 ng/mL — ABNORMAL HIGH

## 2020-01-12 LAB — TSH: TSH: 2.94 mIU/L (ref 0.40–4.50)

## 2020-01-21 ENCOUNTER — Other Ambulatory Visit: Payer: Self-pay

## 2020-01-21 DIAGNOSIS — Z1211 Encounter for screening for malignant neoplasm of colon: Secondary | ICD-10-CM

## 2020-01-21 DIAGNOSIS — Z1212 Encounter for screening for malignant neoplasm of rectum: Secondary | ICD-10-CM | POA: Diagnosis not present

## 2020-01-21 LAB — POC HEMOCCULT BLD/STL (HOME/3-CARD/SCREEN)
Card #2 Fecal Occult Blod, POC: NEGATIVE
Card #3 Fecal Occult Blood, POC: NEGATIVE
Fecal Occult Blood, POC: NEGATIVE

## 2020-02-09 NOTE — Progress Notes (Incomplete)
02/10/20 11:42 PM   [name] Yu 16-Aug-1927 [id number]  Referring provider: [name], MD [address] [place],  [place] [address] No chief complaint on file.   HPI: [name] Yu is a 86 y.o. M who presents today for the evaluation and management of elevated PSA.   Most recent PSA 4.8 on 01/11/20 elevated from previous PSA of 3.2. PSA trend below.   Component     Latest Ref Rng & Units 08/22/2015 09/25/2016 10/29/2017 11/21/2018  PSA     < OR = 4.0 ng/mL 1.46 1.5 2.1 3.2   Component     Latest Ref Rng & Units 01/11/2020  PSA     < OR = 4.0 ng/mL 4.8 (H)    PMH: Past Medical History:  Diagnosis Date  . Arthritis   . ASHD (arteriosclerotic heart disease)   . Atrial fib/flutter, transient    off coumadin since 11/2013- CHADSVAC of 2  . Cancer (HCC)    BASAL CELL   . Colon polyps    2003 hyperplastic and tubuler adenoma  . Depression   . Diverticulosis   . Dyslipidemia   . Dysrhythmia   . FHx: heart disease 08/11/2018  . History of kidney stones   . Hypertension   . OSA (obstructive sleep apnea)    NO CPAP IN 10+ YEARS  . Pre-diabetes   . Rectus sheath hematoma 11/21/2013   Dr. Deatra Ina   . 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 CATARACT EXTRACTION, BILATERAL Bilateral 2019   at the [facility]    . PROSTATE    . TOTAL HIP ARTHROPLASTY Right 03/07/2018   Procedure: RIGHT TOTAL HIP ARTHROPLASTY DIRECT ANTERIOR;  Surgeon: [name], MD;  Location: [facility];  Service: Orthopedics;  Laterality: Right;    Home Medications:  Allergies as of 02/10/2020      Reactions   Gabapentin    Confusion   Ciprofloxacin    dysphoria   Novocain [procaine Hcl]    Prednisone    Nervousness      Medication List       Accurate as of February 09, 2020 11:42 PM. If you have any questions, ask your nurse or doctor.        aspirin EC 81 MG  tablet Take 81 mg by mouth daily.   Cholecalciferol 125 MCG (5000 UT) Tabs Take 1 tablet by mouth daily.   citalopram 40 MG tablet Commonly known as: CELEXA Take 1 tablet Daily for Mood   diltiazem 60 MG tablet Commonly known as: CARDIZEM Take 1 tablet (60 mg total) by mouth 2 (two) times daily.   finasteride 5 MG tablet Commonly known as: PROSCAR Take 1 tablet Daily for Prostate   LORazepam 2 MG tablet Commonly known as: ATIVAN Take 1/2 - 1 tablet at Bedtime ONLY if needed forSleep &  limit to 5 days /week to avoid Addiction & Dementia   Magnesium 200 MG Tabs Take 1 tablet by mouth daily.   multivitamin capsule Take 1 capsule by mouth daily.   tamsulosin 0.4 MG Caps capsule Commonly known as: FLOMAX Take 1 tablet at Bedtime for Prostate   TUMS E-X PO Take 2 tablets by mouth daily.   vitamin C 500 MG tablet Commonly known as: ASCORBIC ACID Take 1,000 mg by mouth daily.       Allergies:  Allergies  Allergen Reactions  . Gabapentin     Confusion   . Ciprofloxacin     dysphoria  . Novocain [Procaine Hcl]   . Prednisone     Nervousness    Family History: Family History  Problem Relation Age of Onset  . Alzheimer's disease Mother   . Kidney cancer Father   . Diabetes Brother   . Breast cancer Sister   . Rectal cancer Sister   . Breast cancer Daughter   . Ulcerative colitis Sister   . Heart disease Brother   . Heart disease Sister     Social History:  reports that he quit smoking about 26 years ago. He has never used smokeless tobacco. He reports that he does not drink alcohol or use drugs.   Physical Exam: There were no vitals taken for this visit.  Constitutional:  Alert and oriented, No acute distress. HEENT: [place] AT, moist mucus membranes.  Trachea midline, no masses. Cardiovascular: No clubbing, cyanosis, or edema. Respiratory: Normal respiratory effort, no increased work of breathing. GI: Abdomen is soft, nontender, nondistended, no  abdominal masses GU: No CVA tenderness Lymph: No cervical or inguinal lymphadenopathy. Skin: No rashes, bruises or suspicious lesions. Neurologic: Grossly intact, no focal deficits, moving all 4 extremities. Psychiatric: Normal mood and affect.  Laboratory Data:  Lab Results  Component Value Date   CREATININE 0.82 01/11/2020    Lab Results  Component Value Date   PSA 4.8 (H) 01/11/2020   PSA 3.2 11/21/2018   PSA 2.1 10/29/2017    Lab Results  Component Value Date   HGBA1C 5.5 01/11/2020    Urinalysis   Pertinent Imaging: ***  Assessment & Plan:     No follow-ups on file.  [facility] [address] [place], [place] [address] [phone]  I, Lucas Mallow, am acting as a scribe for Dr. Hollice Espy,  {Add Scribe Attestation Statement}

## 2020-02-10 ENCOUNTER — Ambulatory Visit: Payer: Self-pay | Admitting: Urology

## 2020-02-17 ENCOUNTER — Other Ambulatory Visit: Payer: Self-pay | Admitting: Cardiovascular Disease

## 2020-02-23 ENCOUNTER — Other Ambulatory Visit: Payer: Self-pay | Admitting: Adult Health

## 2020-02-23 DIAGNOSIS — F411 Generalized anxiety disorder: Secondary | ICD-10-CM

## 2020-02-23 MED ORDER — LORAZEPAM 2 MG PO TABS: ORAL_TABLET | ORAL | 0 refills | Status: DC

## 2020-02-25 NOTE — Progress Notes (Signed)
02/26/20 11:27 AM   [name] Yu 05-28-1927 [id number]  Referring provider: [name], MD [address] [place],  [place] [address] Chief Complaint  Patient presents with  . New Patient (Initial Visit)    elevated PSA, BPH    HPI: [name] Yu is a 86 y.o. M w/ hx of kidney stones who presents today for the evaluation and management of BPH w/ LUTS/ elevated PSA.    His most recent PSA 4.8 as of 01/11/20. PSA trend below.  Per the referral notes, the referral was placed for elevated PSA however the patient believes he is here today for discussion/management of his urinary symptoms.  He denies any weight loss or bone pain.  He reports of urinary hesitancy and urge incontinence. For his urinary hesitancy, he has to strain to urinate when sitting down however can do so when standing up. He denies wearing diapers or pull-ups. He states of 1 accident a day (just a trickle). He denies pain w/ urination or any other bothersome urinary symptoms.   He is currently on finasteride and tamsulosin.   SHx of TURP procedure for prostate tumor on April 23, 1979.   IPSS    Row Name 02/26/20 1500         International Prostate Symptom Score   How often have you had the sensation of not emptying your bladder?  About half the time     How often have you had a weak urinary stream?  Less than 1 in 5 times     How many times did you typically get up at night to urinate?  1 Time     Total IPSS Score  5       Quality of Life due to urinary symptoms   If you were to spend the rest of your life with your urinary condition just the way it is now how would you feel about that?  Mostly Disatisfied        Score:  1-7 Mild 8-19 Moderate 20-35 Severe  PSA trend:  Component     Latest Ref Rng & Units 08/22/2015 09/25/2016 10/29/2017 11/21/2018  PSA     < OR = 4.0 ng/mL 1.46 1.5 2.1 3.2   Component     Latest Ref Rng & Units 01/11/2020  PSA     < OR = 4.0 ng/mL 4.8 (H)     PMH: Past Medical History:  Diagnosis Date  . Arthritis   . ASHD (arteriosclerotic heart disease)   . Atrial fib/flutter, transient    off coumadin since 11/2013- CHADSVAC of 2  . Cancer (HCC)    BASAL CELL   . Colon polyps    2003 hyperplastic and tubuler adenoma  . Depression   . Diverticulosis   . Dyslipidemia   . Dysrhythmia   . FHx: heart disease 08/11/2018  . History of kidney stones   . Hypertension   . OSA (obstructive sleep apnea)    NO CPAP IN 10+ YEARS  . Pre-diabetes   . Rectus sheath hematoma 11/21/2013   Dr. Deatra Ina   . 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 CATARACT EXTRACTION, BILATERAL Bilateral 2019   at the [facility]    . PROSTATE    . TOTAL HIP ARTHROPLASTY Right 03/07/2018   Procedure: RIGHT TOTAL HIP ARTHROPLASTY DIRECT ANTERIOR;  Surgeon: [name], MD;  Location: [facility];  Service: Orthopedics;  Laterality: Right;    Home Medications:  Allergies as of 02/26/2020      Reactions   Gabapentin    Confusion   Ciprofloxacin    dysphoria   Novocain [procaine Hcl]    Prednisone    Nervousness      Medication List       Accurate as of Feb 26, 2020 11:59 PM. If you have any questions, ask your nurse or doctor.        aspirin EC 81 MG tablet Take 81 mg by mouth daily.   Cholecalciferol 125 MCG (5000 UT) Tabs Take 1 tablet by mouth daily.   citalopram 40 MG tablet Commonly known as: CELEXA Take 1 tablet Daily for Mood   diltiazem 60 MG tablet Commonly known as: CARDIZEM TAKE 1 TABLET BY MOUTH  TWICE DAILY   finasteride 5 MG tablet Commonly known as: PROSCAR Take 1 tablet Daily for Prostate   LORazepam 2 MG tablet Commonly known as: ATIVAN Take 1/2 - 1 tablet at Bedtime ONLY if needed forSleep &  limit to 5 days /week to avoid Addiction & Dementia   Magnesium 200 MG Tabs Take 1 tablet by mouth daily.    multivitamin capsule Take 1 capsule by mouth daily.   tamsulosin 0.4 MG Caps capsule Commonly known as: FLOMAX Take 1 tablet at Bedtime for Prostate   TUMS E-X PO Take 2 tablets by mouth daily.   vitamin C 500 MG tablet Commonly known as: ASCORBIC ACID Take 1,000 mg by mouth daily.       Allergies:  Allergies  Allergen Reactions  . Gabapentin     Confusion   . Ciprofloxacin     dysphoria  . Novocain [Procaine Hcl]   . Prednisone     Nervousness    Family History: Family History  Problem Relation Age of Onset  . Alzheimer's disease Mother   . Kidney cancer Father   . Diabetes Brother   . Breast cancer Sister   . Rectal cancer Sister   . Breast cancer Daughter   . Ulcerative colitis Sister   . Heart disease Brother   . Heart disease Sister     Social History:  reports that he quit smoking about 26 years ago. He has never used smokeless tobacco. He reports that he does not drink alcohol or use drugs.   Physical Exam: BP 131/81   Pulse 78   Ht 5\' 10"  (1.778 m)   Wt 201 lb (91.2 kg)   BMI 28.84 kg/m   Constitutional:  Alert and oriented, No acute distress.  Elderly, frail, in wheelchair.   HEENT: [place] AT, moist mucus membranes.  Trachea midline, no masses. Cardiovascular: No clubbing, cyanosis, or edema. Respiratory: Normal respiratory effort, no increased work of breathing. Skin: No rashes, bruises or suspicious lesions. Neurologic: Grossly intact, no focal deficits, moving all 4 extremities. Psychiatric: Normal mood and affect.  Laboratory Data:  Lab Results  Component Value Date   CREATININE 0.82 01/11/2020    Lab Results  Component Value Date   PSA 4.8 (H) 01/11/2020   PSA 3.2 11/21/2018   PSA 2.1 10/29/2017    Lab Results  Component Value Date   HGBA1C 5.5 01/11/2020   Urinalysis UA today positive for 6-10 WBC.  Pertinent Imaging:  Results for orders placed or performed during the hospital encounter of 02/26/20  Urinalysis, Complete  w Microscopic  Result Value Ref Range   Color, Urine YELLOW YELLOW  APPearance CLEAR CLEAR   Specific Gravity, Urine 1.020 1.005 - 1.030   pH 6.5 5.0 - 8.0   Glucose, UA NEGATIVE NEGATIVE mg/dL   Hgb urine dipstick NEGATIVE NEGATIVE   Bilirubin Urine NEGATIVE NEGATIVE   Ketones, ur NEGATIVE NEGATIVE mg/dL   Protein, ur NEGATIVE NEGATIVE mg/dL   Nitrite NEGATIVE NEGATIVE   Leukocytes,Ua TRACE (A) NEGATIVE   Squamous Epithelial / LPF NONE SEEN 0 - 5   WBC, UA 6-10 0 - 5 WBC/hpf   RBC / HPF 0-5 0 - 5 RBC/hpf   Bacteria, UA NONE SEEN NONE SEEN   Assessment & Plan:    1. Elevated PSA UA today unremarkable  Most recent PSA elevated to 4.8 as of 01/11/20, normal for his age  Per AUA guidelines, recommended stopping PSA screening after age 34  Will follow conservatively and treat symptoms only  2. Urgency/Urge incontinence  Trial of Myrbetriq 25 mg samples given to patient  Return in 1 month for symptom recheck w/ PVR/IPSS  3. BPH Adequate emptying of bladder  Continue Flomax and Finasteride    [facility] [address] [place],  [address] [phone]  I, Lucas Mallow, am acting as a scribe for Dr. Hollice Espy,  I have reviewed the above documentation for accuracy and completeness, and I agree with the above.   Hollice Espy, MD

## 2020-02-26 ENCOUNTER — Ambulatory Visit (INDEPENDENT_AMBULATORY_CARE_PROVIDER_SITE_OTHER): Payer: Medicare Other | Admitting: Urology

## 2020-02-26 ENCOUNTER — Other Ambulatory Visit: Payer: Self-pay

## 2020-02-26 ENCOUNTER — Encounter: Payer: Self-pay | Admitting: Urology

## 2020-02-26 ENCOUNTER — Other Ambulatory Visit
Admission: RE | Admit: 2020-02-26 | Discharge: 2020-02-26 | Disposition: A | Discharge status: Home / Self Care | Payer: Medicare Other | Attending: Urology | Admitting: Urology

## 2020-02-26 VITALS — BP 131/81 | HR 78 | Ht 70.0 in | Wt 201.0 lb

## 2020-02-26 DIAGNOSIS — N400 Enlarged prostate without lower urinary tract symptoms: Secondary | ICD-10-CM

## 2020-02-26 DIAGNOSIS — N401 Enlarged prostate with lower urinary tract symptoms: Secondary | ICD-10-CM

## 2020-02-26 LAB — URINALYSIS, COMPLETE (UACMP) WITH MICROSCOPIC
Bacteria, UA: NONE SEEN
Bilirubin Urine: NEGATIVE
Glucose, UA: NEGATIVE mg/dL
Hgb urine dipstick: NEGATIVE
Ketones, ur: NEGATIVE mg/dL
Nitrite: NEGATIVE
Protein, ur: NEGATIVE mg/dL
Specific Gravity, Urine: 1.02 (ref 1.005–1.030)
Squamous Epithelial / HPF: NONE SEEN (ref 0–5)
pH: 6.5 (ref 5.0–8.0)

## 2020-02-26 LAB — BLADDER SCAN AMB NON-IMAGING: Scan Result: 39

## 2020-03-09 ENCOUNTER — Other Ambulatory Visit: Payer: Self-pay | Admitting: Internal Medicine

## 2020-03-09 DIAGNOSIS — N401 Enlarged prostate with lower urinary tract symptoms: Secondary | ICD-10-CM

## 2020-03-09 MED ORDER — TAMSULOSIN HCL 0.4 MG PO CAPS: ORAL_CAPSULE | ORAL | 3 refills | Status: DC

## 2020-03-25 ENCOUNTER — Ambulatory Visit: Payer: Medicare Other | Admitting: Urology

## 2020-03-31 ENCOUNTER — Other Ambulatory Visit: Payer: Self-pay | Admitting: Cardiovascular Disease

## 2020-03-31 ENCOUNTER — Other Ambulatory Visit: Payer: Self-pay | Admitting: Adult Health

## 2020-03-31 DIAGNOSIS — F411 Generalized anxiety disorder: Secondary | ICD-10-CM

## 2020-04-09 NOTE — Progress Notes (Signed)
Cardiology Office Note  Date:  04/11/2020   ID:  [name] Yu, DOB [birth year], MRN [id number]  PCP:  [name], MD   Chief Complaint  Patient presents with  . other    12 month follow up meds reviewed verbally with patient.     HPI:  [name] Yu is a 86 yo gentleman with  Paroxysmal atrial fibrillation, 2007 at [facility], cardioversion CT scan  2008 showing coronary calcifications HTN,  Hyperlipidemia sleep apnea,  prior pipe smoking for 20 years. bradycardia on  beta blockers,  Prior history of falls Previously followed at the [facility].  presents for routine followup of his hypertension and atrial fibrillation.   Last seen in clinic 03/2019 Was not doing any exercise Very sedentary, legs are getting weaker  Denies tachycardia, concerning for arrhythmia Atrial fib causes sx in arms and head, none recently  No recent falls Does not want to use a walker  Takes diltiazem 60 twice daily Denies any orthostasis symptoms  Reports his weight is stable   right total hip replacement surgery with Dr. [name] Recovered well  EKG personally reviewed by myself on todays visit Shows normal sinus rhythm with rate 75 left axis deviation, poor R wave progression to the anterior precordial leads  Other past medical history reviewed (formerly on warfarin but d/c'd in 2014 for rectal sheath hematoma), -CT brain: Study is mildly limited by motion. There is a subcutaneous hematoma in the soft tissues of the left frontotemporal region.  XR L foot: Nondisplaced fracture of the distal phalanx of the great toe.  Diagnosed with post-concussive sx  Notes from the hospital indicatePer EMS, patient had a mechanical fall earlier today. Tripped on a sock. [place] and hit his head on the washer machine. Denied LOC. Was GCS 15 on EMS arrival and while en route, patient became more sluggish and confused. States he had a 5/10 headache and was worried about a head injury. Denies any other  injury.  Prior history of smoking for 30 years from age 46 to age  58 Previous CT scan reviewed with him from 2008 showing coronary calcifications   normal cath in 2003.  Normal stress test in 2005 at [facility]   07/2006 had Afib w/u at [facility].  [place] and on cardizem Dose decreased due to bradycardia Another normal stress test in 2007.    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  PSH:    Past Surgical History:  Procedure Laterality Date  . CATARACT EXTRACTION, BILATERAL Bilateral 2019   at the [facility]    . PROSTATE    . TOTAL HIP ARTHROPLASTY Right 03/07/2018   Procedure: RIGHT TOTAL HIP ARTHROPLASTY DIRECT ANTERIOR;  Surgeon: [name], MD;  Location: [place];  Service: Orthopedics;  Laterality: Right;    Current Outpatient Medications  Medication Sig Dispense Refill  . aspirin EC 81 MG tablet Take 81 mg by mouth daily.    . Calcium Carbonate Antacid (TUMS E-X PO) Take 2 tablets by mouth daily.    . Cholecalciferol 5000 UNITS TABS Take 1 tablet by mouth daily.    . citalopram (CELEXA) 40 MG tablet Take 1 tablet Daily for Mood 90 tablet 3  .  diltiazem (CARDIZEM) 60 MG tablet TAKE 1 TABLET BY MOUTH  TWICE DAILY 180 tablet 3  . finasteride (PROSCAR) 5 MG tablet Take 1 tablet Daily for Prostate 90 tablet 3  . LORazepam (ATIVAN) 2 MG tablet Take 1/2 - 1 tablet at Bedtime  ONLY if needed for Sleep &  limit to 5 days /week to avoid Addiction & Dementia 30 tablet 0  . Magnesium 200 MG TABS Take 1 tablet by mouth daily.    . Multiple Vitamin (MULTIVITAMIN) capsule Take 1 capsule by mouth daily.     . tamsulosin (FLOMAX) 0.4 MG CAPS capsule Take 1 tablet at Bedtime for Prostate 90 capsule 3  . vitamin C (ASCORBIC ACID) 500 MG tablet Take 1,000 mg by mouth daily.     No current facility-administered medications for this visit.    Allergies:   Gabapentin, Ciprofloxacin, Novocain [procaine hcl], and Prednisone   Social History:  The patient  reports that he quit smoking about 26 years ago. He has never used smokeless tobacco. He reports that he does not drink alcohol and does not use drugs.   Family History:   family history includes Alzheimer's disease in his mother; Breast cancer in his daughter and sister; Diabetes in his brother; Heart disease in his brother and sister; Kidney cancer in his father; Rectal cancer in his sister; Ulcerative colitis in his sister.    Review of Systems: Review of Systems  Constitutional: Negative.   Respiratory: Negative.   Cardiovascular: Negative.   Gastrointestinal: Negative.   Musculoskeletal: Negative.        Gait instability  Neurological: Negative.   Psychiatric/Behavioral: Negative.   All other systems reviewed and are negative.     PHYSICAL EXAM: VS:  BP 116/62 (BP Location: Right Arm, Patient Position: Sitting, Cuff Size: Normal)   Pulse 74   Ht 5\' 10"  (1.778 m)   Wt 199 lb 6 oz (90.4 kg)   SpO2 96%   BMI 28.61 kg/m  , BMI Body mass index is 28.61 kg/m.  Presenting with family, in a wheelchair Constitutional:  oriented to person, place, and time. No distress.  HENT:  Head: Grossly normal Eyes:  no discharge. No scleral icterus.  Neck: No JVD, no carotid bruits  Cardiovascular: Regular rate and rhythm, no murmurs appreciated Pulmonary/Chest: Clear to auscultation bilaterally, no wheezes or rails Abdominal: Soft.  no distension.  no tenderness.  Musculoskeletal: Normal range of motion Neurological:  normal muscle tone. Coordination normal. No atrophy Skin: Skin warm and dry Psychiatric: normal affect, pleasant   Recent  Labs: 01/11/2020: ALT 10; BUN 8; Creat 0.82; Hemoglobin 16.5; Magnesium 2.0; Platelets 216; Potassium 3.9; Sodium 141; TSH 2.94    Lipid Panel Lab Results  Component Value Date   CHOL 211 (H) 09/09/2019   HDL 51 09/09/2019   LDLCALC 133 (H) 09/09/2019   TRIG 141 09/09/2019      Wt Readings from Last 3 Encounters:  04/11/20 199 lb 6 oz (90.4 kg)  02/26/20 201 lb (91.2 kg)  01/11/20 202 lb 9.6 oz (91.9 kg)       ASSESSMENT AND PLAN:  Essential hypertension - Plan: EKG 12-Lead Blood pressure is well controlled on today's visit. No changes made to the medications.  Mixed hyperlipidemia - Plan: EKG 12-Lead  he does not want a statin despite prior CT scan 2008 showing coronary calcification Denies any anginal symptoms  Paroxysmal atrial fibrillation ([place]) - Plan: EKG 12-Lead  denies any tachycardia concerning for arrhythmia Currently on low-dose aspirin  Previous fall with trauma to the left temporal region Also previous hematoma left ABD on warfarin On previous discussion family and patient did not want anticoagulation Atrial fibrillation back in 2007, no clear documentation of atrial fibrillation since that time Discussed again today, declining anticoagulation  Coronary artery disease due to lipid rich plaque -  Currently with no symptoms of angina. No further workup at this time. Continue current medication regimen.  OSA (obstructive sleep apnea) - Plan: EKG 12-Lead Managed by primary care  History of fall, leg weakness No recent falls  Discussed walking program   Disposition:   F/U  12 months   Total encounter time more than 25 minutes  Greater than 50% was spent in counseling and coordination of care with the patient    Orders Placed This Encounter  Procedures  . EKG 12-Lead     Signed, Esmond Plants, M.D., Ph.D. 04/11/2020  [place], [place]

## 2020-04-11 ENCOUNTER — Ambulatory Visit (INDEPENDENT_AMBULATORY_CARE_PROVIDER_SITE_OTHER): Payer: Medicare Other | Admitting: Cardiovascular Disease

## 2020-04-11 ENCOUNTER — Encounter: Payer: Self-pay | Admitting: Cardiovascular Disease

## 2020-04-11 ENCOUNTER — Other Ambulatory Visit: Payer: Self-pay

## 2020-04-11 VITALS — BP 116/62 | HR 74 | Ht 70.0 in | Wt 199.4 lb

## 2020-04-11 DIAGNOSIS — I2510 Atherosclerotic heart disease of native coronary artery without angina pectoris: Secondary | ICD-10-CM | POA: Diagnosis not present

## 2020-04-11 DIAGNOSIS — F17200 Nicotine dependence, unspecified, uncomplicated: Secondary | ICD-10-CM

## 2020-04-11 DIAGNOSIS — I2583 Coronary atherosclerosis due to lipid rich plaque: Secondary | ICD-10-CM

## 2020-04-11 DIAGNOSIS — I10 Essential (primary) hypertension: Secondary | ICD-10-CM

## 2020-04-11 DIAGNOSIS — I480 Paroxysmal atrial fibrillation: Secondary | ICD-10-CM

## 2020-04-11 DIAGNOSIS — E782 Mixed hyperlipidemia: Secondary | ICD-10-CM | POA: Diagnosis not present

## 2020-04-11 NOTE — Progress Notes (Signed)
MEDICARE ANNUAL WELLNESS VISIT AND FOLLOW UP Assessment:   Diagnoses and all orders for this visit:  Encounter for Medicare annual wellness exam  Essential hypertension Continue medications Monitor blood pressure at home; call if consistently over 130/80 Continue DASH diet.   Reminder to go to the ER if any CP, SOB, nausea, dizziness, severe HA, changes vision/speech, left arm numbness and tingling and jaw pain.  Coronary artery disease due to lipid rich plaque Control blood pressure, cholesterol, glucose, increase exercise.  Followed by Dr. Rockey Yu  Paroxysmal atrial fibrillation ([name]) Rate controlled today; continue ASA Followed by Dr. Rockey Yu  ASHD (arteriosclerotic heart disease) Control blood pressure, cholesterol, glucose, increase exercise.  Followed by Dr. Rockey Yu  OSA (obstructive sleep apnea) Continue with CPAP  Diverticulosis Add soluble fiber   Vitamin D deficiency At goal at recent check; continue to recommend supplementation for goal of 70-100 Defer vitamin D level  Other abnormal glucose Recent A1Cs at goal Discussed diet/exercise, weight management  Defer A1C; check CMP  Personal history of colonic polyps No more colonoscopies due to age  Anxiety Well managed by current regimen; continue medications- reminded risks of benzos and to limit use Stress management techniques discussed, increase water, good sleep hygiene discussed, increase exercise, and increase veggies.   Mixed hyperlipidemia Continue low cholesterol diet and exercise.  Check lipid panel.   Medication management CBC, CMP/GFR  Depression, major, in remission ([name] Yu) Continue medications  Lifestyle discussed: diet/exerise, sleep hygiene, stress management, hydration  Overweight (BMI 26-29) Continue to recommend diet heavy in fruits and veggies and low in animal meats, cheeses, and dairy products, appropriate calorie intake Discuss exercise recommendations routinely Continue to monitor  weight at each visit  Benign prostatic hyperplasia, unspecified whether lower urinary tract symptoms present Continue with medications  Arthritis of left hip Followed by ortho; using OTC analgesics for mild intermittent pain Encouraged continue exercise/activity  Gingival enlargement Significantly improved; nearly resolved; is following up with the [facility] for this biopsy was reportedly negative Follow up dental routinely   Seb dermatitis Ketoconazole 2 % shampoo twice weekly   Over 30 minutes of exam, counseling, chart review, and critical decision making was performed  Future Appointments  Date Time Provider [place]  04/22/2020  2:45 PM [name] Espy, MD [facility] None  07/19/2020  2:30 PM [name] Pinto, MD [facility] None  01/18/2021 11:00 AM [name] Pinto, MD [facility] None     Plan:   During the course of the visit the patient was educated and counseled about appropriate screening and preventive services including:    Pneumococcal vaccine   Influenza vaccine  Prevnar 13  Td vaccine  Screening electrocardiogram  Colorectal cancer screening  Diabetes screening  Glaucoma screening  Nutrition counseling    Subjective:  [name] Yu is a 86 y.o. male who presents for Medicare Annual Wellness Visit and 3 month follow up for HTN, hyperlipidemia, glucose managment, and vitamin D Def.  Here with wife.   Gingivitis, managing with [facility] magic mouthwash as needed via [place]  He is concerned with flaking on scalp for several months   he has a diagnosis of depression/anxiety and is currently on celexa 40 mg daily, recently mirtazapine 7.5 mg in the evening by [facility] and lorazepan 1-2 mg PRN anxiety, reports symptoms are fairly controlled on current regimen, working with  he currently takes 0.5 mg 5/7 nights of the week, trying to reduce.   BMI is Body mass index is 28.52 kg/m., he has not been working on diet and  exercise. Has L hip pain, patient declined, walks  with cane.  Wt Readings from Last 3 Encounters:  04/13/20 198 lb 12.8 oz (90.2 kg)  04/11/20 199 lb 6 oz (90.4 kg)  02/26/20 201 lb (91.2 kg)   Patient has hx/o pAfib since d/c'd consequent of a large rectus sheath hematoma & is only on LD bASA. Heart cath in 2003 was Negative. Cardiolite in 2007 was also Negative.  Has CAD per imaging, not aggressively pursued due to age 89 with Dr. Rockey Yu  His blood pressure has been controlled at home, today their BP is BP: 116/68 He does not workout. He denies chest pain, shortness of breath, dizziness.   He is not on cholesterol medication due to age. His cholesterol is not at goal. The cholesterol last visit was:   Lab Results  Component Value Date   CHOL 211 (H) 09/09/2019   HDL 51 09/09/2019   LDLCALC 133 (H) 09/09/2019   TRIG 141 09/09/2019   CHOLHDL 4.1 09/09/2019   He has been working on diet and exercise for glucose management, and denies foot ulcerations, increased appetite, nausea, paresthesia of the feet, polydipsia, polyuria, visual disturbances, vomiting and weight loss. Last A1C in the office was:  Lab Results  Component Value Date   HGBA1C 5.5 01/11/2020   Last GFR Lab Results  Component Value Date   GFRNONAA 77 01/11/2020    Patient is on Vitamin D supplement and near goal of 60 at recent check:    Lab Results  Component Value Date   VD25OH 68 01/11/2020     BPH sx well controlled by finasteride and tamsulosin, saw urology due to slow stream and elevated PSA Had poor urine outflow with myrbetriq for urinary frequency and stopped  Per patient had ? TURP of prostate in 90s Lab Results  Component Value Date   PSA 4.8 (H) 01/11/2020   PSA 3.2 11/21/2018   PSA 2.1 10/29/2017     Medication Review:    Current Outpatient Medications (Cardiovascular):  .  diltiazem (CARDIZEM) 60 MG tablet, TAKE 1 TABLET BY MOUTH  TWICE DAILY   Current Outpatient Medications (Analgesics):  .  aspirin EC 81 MG tablet, Take 81 mg  by mouth daily.   Current Outpatient Medications (Other):  Marland Kitchen  Calcium Carbonate Antacid (TUMS E-X PO), Take 2 tablets by mouth daily. .  Cholecalciferol 5000 UNITS TABS, Take 1 tablet by mouth daily. .  citalopram (CELEXA) 40 MG tablet, Take 1 tablet Daily for Mood .  finasteride (PROSCAR) 5 MG tablet, Take 1 tablet Daily for Prostate .  LORazepam (ATIVAN) 2 MG tablet, Take 1/2 - 1 tablet at Bedtime  ONLY if needed for Sleep &  limit to 5 days /week to avoid Addiction & Dementia .  Magnesium 200 MG TABS, Take 1 tablet by mouth daily. .  mirtazapine (REMERON) 7.5 MG tablet, Take 7.5 mg by mouth at bedtime. .  Multiple Vitamin (MULTIVITAMIN) capsule, Take 1 capsule by mouth daily. .  tamsulosin (FLOMAX) 0.4 MG CAPS capsule, Take 1 tablet at Bedtime for Prostate .  vitamin C (ASCORBIC ACID) 500 MG tablet, Take 1,000 mg by mouth daily.  Allergies: Allergies  Allergen Reactions  . Gabapentin     Confusion   . Ciprofloxacin     dysphoria  . Novocain [Procaine Hcl]   . Prednisone     Nervousness    Current Problems (verified) has Essential hypertension; Abnormal glucose (prediabetes); Hyperlipidemia, mixed; Atrial fibrillation ([place]); Personal history of colonic polyps;  ASHD (arteriosclerotic heart disease); Depression, major, in remission ([place]); Diverticulosis; Medication management; Vitamin D deficiency; Encounter for Medicare annual wellness exam; Overweight (BMI 25.0-29.9); CAD (coronary artery disease); Benign localized prostatic hyperplasia with lower urinary tract symptoms (LUTS); H/O total hip arthroplasty, right; Gingival enlargement; Arthritis of left hip; Former pipe smoker; and Interstitial lung disease ([place]) on their problem list.  Screening Tests Immunization History  Administered Date(s) Administered  . DT (Pediatric) 07/06/2014  . Influenza Split 07/20/2014, 07/18/2016  . Influenza, High Dose Seasonal PF 08/22/2015, 07/15/2017  . Influenza-Unspecified 06/15/2018,  08/04/2019  . PFIZER SARS-COV-2 Vaccination 11/12/2019, 12/07/2019  . Pneumococcal Conjugate-13 07/06/2014  . Pneumococcal Polysaccharide-23 06/09/2013  . Td 12/15/2016  . Zoster 10/15/2005    Preventative care: Last colonoscopy: 2003, done due to age  Prior vaccinations: TD or Tdap: 2018  Influenza: 07/2019  Pneumococcal: 2014 Prevnar13: 2015 Shingles/Zostavax: 2007 Covid 19: 2/2, 2021, pfizer  Names of Other Physician/Practitioners you currently use: 1. [facility] here for primary care 2. [facility], eye doctor, last visit 2021  3. Dr. Payton Spark, [facility] dentistry, last visit 2020    Patient Care Team: [name] Pinto, MD as PCP - General (Internal Medicine) Benito Mccreedy, MD as Referring Physician (Family Medicine) Minna Merritts, MD as Consulting Physician (Cardiology) Inda Castle, MD (Inactive) as Consulting Physician (Gastroenterology)  Surgical: He  has a past surgical history that includes Hernia repair; PROSTATE; Total hip arthroplasty (Right, 03/07/2018); and Cataract extraction, bilateral (Bilateral, 2019). Family His family history includes Alzheimer's disease in his mother; Breast cancer in his daughter and sister; Diabetes in his brother; Heart disease in his brother and sister; Kidney cancer in his father; Rectal cancer in his sister; Ulcerative colitis in his sister. Social history  He reports that he quit smoking about 26 years ago. His smoking use included pipe. He has never used smokeless tobacco. He reports that he does not drink alcohol and does not use drugs.  MEDICARE WELLNESS OBJECTIVES: Physical activity: Current Exercise Habits: The patient does not participate in regular exercise at present, Exercise limited by: orthopedic condition(s) Cardiac risk factors: Cardiac Risk Factors include: advanced age (>49men, >20 women);dyslipidemia;hypertension;sedentary lifestyle Depression/mood screen:    Depression screen [facility] 2/9 04/13/2020  Decreased Interest 0  Down, Depressed, Hopeless 1  PHQ - 2 Score 1  Altered sleeping 1  Tired, decreased energy 1  Change in appetite 0  Feeling bad or failure about yourself  0  Trouble concentrating 0  Moving slowly or fidgety/restless 0  Suicidal thoughts 0  PHQ-9 Score 3  Difficult doing work/chores Somewhat difficult    ADLs:  In your present state of health, do you have any difficulty performing the following activities: 04/13/2020 01/10/2020  Hearing? N Y  Comment - decreased hearing  Vision? N N  Difficulty concentrating or making decisions? N N  Walking or climbing stairs? N N  Dressing or bathing? N N  Doing errands, shopping? N N  Comment wife drives -  Some recent data might be hidden     Cognitive Testing  Alert? Yes  Normal Appearance?Yes  Oriented to person? Yes  Place? Yes   Time? Yes  Recall of three objects?  Yes  Can perform simple calculations? Yes  Displays appropriate judgment?Yes  Can read the correct time from a watch face?Yes  EOL planning: Does Patient Have a Medical Advance Directive?: Yes Type of Advance Directive: Healthcare Power of Attorney, Living will Does patient want to make changes to medical  advance directive?: No - Patient declined Copy of [place] in Chart?: No - copy requested   Objective:   Today's Vitals   04/13/20 1431  BP: 116/68  Pulse: 73  Temp: (!) 97.3 F (36.3 C)  SpO2: 96%  Weight: 198 lb 12.8 oz (90.2 kg)  Height: 5\' 10"  (1.778 m)   Body mass index is 28.52 kg/m.  General appearance: alert, no distress, WD/WN, male  HEENT: normocephalic, sclerae anicteric, TMs pearly, nares patent, no discharge or erythema, pharynx normal Oral cavity: MMM, no lesions, no white coating, erythema in mouth, very mild gingival enlargement Neck: supple, no lymphadenopathy, no thyromegaly, no masses Heart: RRR, normal S1, S2, no murmurs Lungs: CTA bilaterally, no wheezes,  rhonchi, or rales Abdomen: +bs, soft, non tender, non distended, no masses, no hepatomegaly, no splenomegaly Musculoskeletal: nontender, no swelling, no obvious deformity Extremities: no edema, no cyanosis, no clubbing, scattered ecchymoses Pulses: 1+ symmetric, upper and lower extremities, normal cap refill Neurological: alert, oriented x 3, CN2-12 intact, strength normal upper extremities and lower extremities, sensation normal throughout, gait slow, antalgic Psychiatric: normal affect, behavior normal, pleasant  Skin: scalp with mild erythema, greasy texture and flaking   Medicare Attestation I have personally reviewed: The patient's medical and social history Their use of alcohol, tobacco or illicit drugs Their current medications and supplements The patient's functional ability including ADLs,fall risks, home safety risks, cognitive, and hearing and visual impairment Diet and physical activities Evidence for depression or mood disorders  The patient's weight, height, BMI, and visual acuity have been recorded in the chart.  I have made referrals, counseling, and provided education to the patient based on review of the above and I have provided the patient with a written personalized care plan for preventive services.     [name] Ribas, NP   04/13/2020

## 2020-04-11 NOTE — Patient Instructions (Signed)
Medication Instructions:  No changes  If you need a refill on your cardiac medications before your next appointment, please call your pharmacy.    Lab work: No new labs needed   If you have labs (blood work) drawn today and your tests are completely normal, you will receive your results only by: Marland Kitchen MyChart Message (if you have MyChart) OR . A paper copy in the mail If you have any lab test that is abnormal or we need to change your treatment, we will call you to review the results.   Testing/Procedures: No new testing needed   Follow-Up: At [facility], you and your health needs are our priority.  As part of our continuing mission to provide you with exceptional heart care, we have created designated Provider Care Teams.  These Care Teams include your primary Cardiologist (physician) and Advanced Practice Providers (APPs -  Physician Assistants and Nurse Practitioners) who all work together to provide you with the care you need, when you need it.  . You will need a follow up appointment in 12 months   . Providers on your designated Care Team:   . Murray Hodgkins, NP . Christell Faith, PA-C . Marrianne Mood, PA-C  Any Other Special Instructions Will Be Listed Below (If Applicable).

## 2020-04-13 ENCOUNTER — Other Ambulatory Visit: Payer: Self-pay

## 2020-04-13 ENCOUNTER — Ambulatory Visit (INDEPENDENT_AMBULATORY_CARE_PROVIDER_SITE_OTHER): Payer: Medicare Other | Admitting: Adult Health

## 2020-04-13 ENCOUNTER — Encounter: Payer: Self-pay | Admitting: Adult Health

## 2020-04-13 VITALS — BP 116/68 | HR 73 | Temp 97.3°F | Ht 70.0 in | Wt 198.8 lb

## 2020-04-13 DIAGNOSIS — L219 Seborrheic dermatitis, unspecified: Secondary | ICD-10-CM

## 2020-04-13 DIAGNOSIS — D692 Other nonthrombocytopenic purpura: Secondary | ICD-10-CM

## 2020-04-13 DIAGNOSIS — Z0001 Encounter for general adult medical examination with abnormal findings: Secondary | ICD-10-CM | POA: Diagnosis not present

## 2020-04-13 DIAGNOSIS — E782 Mixed hyperlipidemia: Secondary | ICD-10-CM | POA: Diagnosis not present

## 2020-04-13 DIAGNOSIS — Z87891 Personal history of nicotine dependence: Secondary | ICD-10-CM

## 2020-04-13 DIAGNOSIS — I10 Essential (primary) hypertension: Secondary | ICD-10-CM

## 2020-04-13 DIAGNOSIS — K5790 Diverticulosis of intestine, part unspecified, without perforation or abscess without bleeding: Secondary | ICD-10-CM

## 2020-04-13 DIAGNOSIS — F325 Major depressive disorder, single episode, in full remission: Secondary | ICD-10-CM

## 2020-04-13 DIAGNOSIS — E559 Vitamin D deficiency, unspecified: Secondary | ICD-10-CM

## 2020-04-13 DIAGNOSIS — Z86010 Personal history of colonic polyps: Secondary | ICD-10-CM

## 2020-04-13 DIAGNOSIS — R7309 Other abnormal glucose: Secondary | ICD-10-CM | POA: Diagnosis not present

## 2020-04-13 DIAGNOSIS — J849 Interstitial pulmonary disease, unspecified: Secondary | ICD-10-CM

## 2020-04-13 DIAGNOSIS — I2510 Atherosclerotic heart disease of native coronary artery without angina pectoris: Secondary | ICD-10-CM

## 2020-04-13 DIAGNOSIS — Z79899 Other long term (current) drug therapy: Secondary | ICD-10-CM | POA: Diagnosis not present

## 2020-04-13 DIAGNOSIS — N401 Enlarged prostate with lower urinary tract symptoms: Secondary | ICD-10-CM

## 2020-04-13 DIAGNOSIS — E663 Overweight: Secondary | ICD-10-CM

## 2020-04-13 DIAGNOSIS — I480 Paroxysmal atrial fibrillation: Secondary | ICD-10-CM | POA: Diagnosis not present

## 2020-04-13 DIAGNOSIS — R6889 Other general symptoms and signs: Secondary | ICD-10-CM | POA: Diagnosis not present

## 2020-04-13 DIAGNOSIS — I2583 Coronary atherosclerosis due to lipid rich plaque: Secondary | ICD-10-CM

## 2020-04-13 DIAGNOSIS — Z0000 Encounter for general adult medical examination without abnormal findings: Secondary | ICD-10-CM

## 2020-04-13 MED ORDER — KETOCONAZOLE 2 % EX SHAM: 1.0000 "application " | MEDICATED_SHAMPOO | CUTANEOUS | 0 refills | Status: DC

## 2020-04-13 NOTE — Patient Instructions (Signed)
[name] Yu , Thank you for taking time to come for your Medicare Wellness Visit. I appreciate your ongoing commitment to your health goals. Please review the following plan we discussed and let me know if I can assist you in the future.   These are the goals we discussed: Goals     Exercise 150 min/wk Moderate Activity     Start with 15 min of walking per day and gradually increase up to 30+ min     LDL CALC < 130       This is a list of the screening recommended for you and due dates:  Health Maintenance  Topic Date Due   Flu Shot  05/15/2020   Tetanus Vaccine  12/16/2026   COVID-19 Vaccine  Completed   Pneumonia vaccines  Completed    Antifungal shampoos include ketoconazole 2% twice weekly   Can also try zinc pyrithione 1% and selenium sulfide 2.5% shampoo, available over the counter.     Seborrheic Dermatitis, Adult Seborrheic dermatitis is a skin disease that causes red, scaly patches. It usually occurs on the scalp, and it is often called dandruff. The patches may appear on other parts of the body. Skin patches tend to appear where there are many oil glands in the skin. Areas of the body that are commonly affected include:  Scalp.  Skin folds of the body.  Ears.  Eyebrows.  Neck.  Face.  Armpits.  The bearded area of men's faces. The condition may come and go for no known reason, and it is often long-lasting (chronic). What are the causes? The cause of this condition is not known. What increases the risk? This condition is more likely to develop in people who:  Have certain conditions, such as: ? HIV (human immunodeficiency virus). ? AIDS (acquired immunodeficiency syndrome). ? Parkinson disease. ? Mood disorders, such as depression.  Are 10-64 years old. What are the signs or symptoms? Symptoms of this condition include:  Thick scales on the scalp.  Redness on the face or in the armpits.  Skin that is flaky. The flakes may be white or  yellow.  Skin that seems oily or dry but is not helped with moisturizers.  Itching or burning in the affected areas. How is this diagnosed? This condition is diagnosed with a medical history and physical exam. A sample of your skin may be tested (skin biopsy). You may need to see a skin specialist (dermatologist). How is this treated? There is no cure for this condition, but treatment can help to manage the symptoms. You may get treatment to remove scales, lower the risk of skin infection, and reduce swelling or itching. Treatment may include:  Creams that reduce swelling and irritation (steroids).  Creams that reduce skin yeast.  Medicated shampoo, soaps, moisturizing creams, or ointments.  Medicated moisturizing creams or ointments. Follow these instructions at home:  Apply over-the-counter and prescription medicines only as told by your health care provider.  Use any medicated shampoo, soaps, skin creams, or ointments only as told by your health care provider.  Keep all follow-up visits as told by your health care provider. This is important. Contact a health care provider if:  Your symptoms do not improve with treatment.  Your symptoms get worse.  You have new symptoms. This information is not intended to replace advice given to you by your health care provider. Make sure you discuss any questions you have with your health care provider. Document Revised: 09/13/2017 Document Reviewed: 01/19/2016 Elsevier Patient  Education  [employer].

## 2020-04-14 LAB — COMPLETE METABOLIC PANEL WITH GFR
AG Ratio: 1.8 (calc) (ref 1.0–2.5)
ALT: 11 U/L (ref 9–46)
AST: 18 U/L (ref 10–35)
Albumin: 4.2 g/dL (ref 3.6–5.1)
Alkaline phosphatase (APISO): 66 U/L (ref 35–144)
BUN: 12 mg/dL (ref 7–25)
CO2: 28 mmol/L (ref 20–32)
Calcium: 10.1 mg/dL (ref 8.6–10.3)
Chloride: 103 mmol/L (ref 98–110)
Creat: 0.88 mg/dL (ref 0.70–1.11)
GFR, Est African American: 87 mL/min/{1.73_m2} (ref >=60)
GFR, Est Non African American: 75 mL/min/{1.73_m2} (ref >=60)
Globulin: 2.3 g/dL (calc) (ref 1.9–3.7)
Glucose, Bld: 93 mg/dL (ref 65–99)
Potassium: 4.2 mmol/L (ref 3.5–5.3)
Sodium: 140 mmol/L (ref 135–146)
Total Bilirubin: 0.5 mg/dL (ref 0.2–1.2)
Total Protein: 6.5 g/dL (ref 6.1–8.1)

## 2020-04-14 LAB — LIPID PANEL
Cholesterol: 214 mg/dL — ABNORMAL HIGH (ref <200)
HDL: 58 mg/dL (ref >=40)
LDL Cholesterol (Calc): 135 mg/dL (calc) — ABNORMAL HIGH
Non-HDL Cholesterol (Calc): 156 mg/dL (calc) — ABNORMAL HIGH (ref <130)
Total CHOL/HDL Ratio: 3.7 (calc) (ref <5.0)
Triglycerides: 105 mg/dL (ref <150)

## 2020-04-14 LAB — CBC WITH DIFFERENTIAL/PLATELET
Absolute Monocytes: 648 cells/uL (ref 200–950)
Basophils Absolute: 33 cells/uL (ref 0–200)
Basophils Relative: 0.4 %
Eosinophils Absolute: 123 cells/uL (ref 15–500)
Eosinophils Relative: 1.5 %
HCT: 48.6 % (ref 38.5–50.0)
Hemoglobin: 16.8 g/dL (ref 13.2–17.1)
Lymphs Abs: 1878 cells/uL (ref 850–3900)
MCH: 32.7 pg (ref 27.0–33.0)
MCHC: 34.6 g/dL (ref 32.0–36.0)
MCV: 94.6 fL (ref 80.0–100.0)
MPV: 9.2 fL (ref 7.5–12.5)
Monocytes Relative: 7.9 %
Neutro Abs: 5519 cells/uL (ref 1500–7800)
Neutrophils Relative %: 67.3 %
Platelets: 218 10*3/uL (ref 140–400)
RBC: 5.14 10*6/uL (ref 4.20–5.80)
RDW: 12.3 % (ref 11.0–15.0)
Total Lymphocyte: 22.9 %
WBC: 8.2 10*3/uL (ref 3.8–10.8)

## 2020-04-14 LAB — MAGNESIUM: Magnesium: 2.1 mg/dL (ref 1.5–2.5)

## 2020-04-14 LAB — TSH: TSH: 2.3 mIU/L (ref 0.40–4.50)

## 2020-04-19 ENCOUNTER — Other Ambulatory Visit: Payer: Self-pay | Admitting: *Deleted

## 2020-04-19 DIAGNOSIS — N400 Enlarged prostate without lower urinary tract symptoms: Secondary | ICD-10-CM

## 2020-04-22 ENCOUNTER — Ambulatory Visit: Payer: Medicare Other | Admitting: Urology

## 2020-05-08 ENCOUNTER — Other Ambulatory Visit: Payer: Self-pay | Admitting: Internal Medicine

## 2020-05-08 DIAGNOSIS — F411 Generalized anxiety disorder: Secondary | ICD-10-CM

## 2020-05-12 ENCOUNTER — Other Ambulatory Visit: Payer: Self-pay | Admitting: Adult Health

## 2020-05-12 DIAGNOSIS — L219 Seborrheic dermatitis, unspecified: Secondary | ICD-10-CM

## 2020-05-19 ENCOUNTER — Other Ambulatory Visit: Payer: Self-pay | Admitting: *Deleted

## 2020-05-19 DIAGNOSIS — N400 Enlarged prostate without lower urinary tract symptoms: Secondary | ICD-10-CM

## 2020-05-19 NOTE — Progress Notes (Signed)
05/20/2020 11:58 AM   [name] Yu 10-29-1926 [id number]  Referring provider: [name], MD [address] [place],  [place] [id number] Chief Complaint  Patient presents with  . Follow-up    4 week follow-up    HPI: [name] Yu is a 86 y.o. male with a history of nephrolithiasis, elevated PSA, and BPH with LUTS. At last visit patient was provided with trial 25 mg Myrebtriq and returns today for reassessment. He is accompanied by his wife.   His most recent PSA 4.8 as of 01/11/20. PSA trend below.  Per the referral notes, the referral was placed for elevated PSA however the patient believes he is here today for discussion/management of his urinary symptoms.  He denies any weight loss or bone pain.  He is currently on finasteride and tamsulosin.   Today his PVR is 0 mL.   He reports that the Myrbetriq worked too well. He had about 2 accidents per week. He felt "locked up", had trouble urinating and low back pain 1 week after starting Myrbetriq trial. Patient stopped Myrbetriq and has significant symptomatic improvement. He has a good flow. He reports incontinence in the AM when he get out of bed which he attributes to a reflex. He feels like symptoms are bothersome but requires no medication.   He reports some kidney pain.   SHx of TURP procedure for prostate tumor on April 23, 1979.   PSA trend: Component     Latest Ref Rng & Units 08/22/2015 09/25/2016 10/29/2017 11/21/2018  PSA     < OR = 4.0 ng/mL 1.46 1.5 2.1 3.2   Component     Latest Ref Rng & Units 01/11/2020  PSA     < OR = 4.0 ng/mL 4.8 (H)    PMH: Past Medical History:  Diagnosis Date  . Arthritis   . ASHD (arteriosclerotic heart disease)   . Atrial fib/flutter, transient    off coumadin since 11/2013- CHADSVAC of 2  . Cancer (HCC)    BASAL CELL   . Colon polyps    2003 hyperplastic and tubuler adenoma  . Depression   . Diverticulosis   . Dyslipidemia   . Dysrhythmia   . FHx: heart  disease 08/11/2018  . History of kidney stones   . Hypertension   . OSA (obstructive sleep apnea)    NO CPAP IN 10+ YEARS  . Pre-diabetes   . Rectus sheath hematoma 11/21/2013   Dr. Deatra Ina   . 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 CATARACT EXTRACTION, BILATERAL Bilateral 2019   at the [facility]    . PROSTATE    . TOTAL HIP ARTHROPLASTY Right 03/07/2018   Procedure: RIGHT TOTAL HIP ARTHROPLASTY DIRECT ANTERIOR;  Surgeon: [name], MD;  Location: [facility];  Service: Orthopedics;  Laterality: Right;    Home Medications:  Allergies as of 05/20/2020      Reactions   Gabapentin    Confusion   Ciprofloxacin    dysphoria   Novocain [procaine Hcl]    Prednisone    Nervousness      Medication List       Accurate as of May 20, 2020 11:59 PM. If you have any questions, ask your nurse or doctor.        aspirin EC 81 MG tablet Take 81 mg by mouth daily.   Cholecalciferol  125 MCG (5000 UT) Tabs Take 1 tablet by mouth daily.   citalopram 40 MG tablet Commonly known as: CELEXA Take 1 tablet Daily for Mood   diltiazem 60 MG tablet Commonly known as: CARDIZEM TAKE 1 TABLET BY MOUTH  TWICE DAILY   finasteride 5 MG tablet Commonly known as: PROSCAR Take 1 tablet Daily for Prostate   ketoconazole 2 % shampoo Commonly known as: NIZORAL APPLY SHAMPOO TOPICALLY  TWICE WEEKLY AS DIRECTED   LORazepam 2 MG tablet Commonly known as: ATIVAN TAKE 1/2 TO 1 TABLET BY  MOUTH AT BEDTIME ONLY IF  NEEDED FOR SLEEP AND LIMIT  TO 5 DAYS PER WEEK TO AVOID ADDICTION AND DEMENTIA   Magnesium 200 MG Tabs Take 1 tablet by mouth daily.   mirtazapine 7.5 MG tablet Commonly known as: REMERON Take 7.5 mg by mouth at bedtime.   multivitamin capsule Take 1 capsule by mouth daily.   tamsulosin 0.4 MG Caps capsule Commonly known as: FLOMAX Take 1 tablet at Bedtime for  Prostate   TUMS E-X PO Take 2 tablets by mouth daily.   vitamin C 500 MG tablet Commonly known as: ASCORBIC ACID Take 1,000 mg by mouth daily.       Allergies:  Allergies  Allergen Reactions  . Gabapentin     Confusion   . Ciprofloxacin     dysphoria  . Novocain [Procaine Hcl]   . Prednisone     Nervousness    Family History: Family History  Problem Relation Age of Onset  . Alzheimer's disease Mother   . Kidney cancer Father   . Diabetes Brother   . Breast cancer Sister   . Rectal cancer Sister   . Breast cancer Daughter   . Ulcerative colitis Sister   . Heart disease Brother   . Heart disease Sister     Social History:  reports that he quit smoking about 26 years ago. His smoking use included pipe. He has never used smokeless tobacco. He reports that he does not drink alcohol and does not use drugs.   Physical Exam: BP 122/82   Pulse 74   Ht 5\' 10"  (1.778 m)   Wt 199 lb (90.3 kg)   BMI 28.55 kg/m   Constitutional:  Alert and oriented, No acute distress. Elderly patient sitting in wheelchair in not acute distress.  Accompanied by wife. HEENT: [place] AT, moist mucus membranes.  Trachea midline, no masses. Cardiovascular: No clubbing, cyanosis, or edema. Respiratory: Normal respiratory effort, no increased work of breathing. Skin: No rashes, bruises or suspicious lesions. Neurologic: Grossly intact, no focal deficits, moving all 4 extremities. Psychiatric: Normal mood and affect.  Laboratory Data:  Lab Results  Component Value Date   CREATININE 0.88 04/13/2020   Lab Results  Component Value Date   HGBA1C 5.5 01/11/2020    Urinalysis Negative  Pertinent Imaging: Results for orders placed or performed during the hospital encounter of 05/20/20  Urinalysis, Complete w Microscopic  Result Value Ref Range   Color, Urine YELLOW YELLOW   APPearance CLEAR CLEAR   Specific Gravity, Urine 1.020 1.005 - 1.030   pH 7.0 5.0 - 8.0   Glucose, UA NEGATIVE  NEGATIVE mg/dL   Hgb urine dipstick NEGATIVE NEGATIVE   Bilirubin Urine NEGATIVE NEGATIVE   Ketones, ur NEGATIVE NEGATIVE mg/dL   Protein, ur NEGATIVE NEGATIVE mg/dL   Nitrite NEGATIVE NEGATIVE   Leukocytes,Ua NEGATIVE NEGATIVE   Squamous Epithelial / LPF 0-5 0 - 5   WBC, UA 0-5 0 -  5 WBC/hpf   RBC / HPF 0-5 0 - 5 RBC/hpf   Bacteria, UA NONE SEEN NONE SEEN     Assessment & Plan:    1. Elevated PSA UA today unremarkable. Most recent PSA elevated to 4.8 as of 01/11/20, normal for his age  Many questions were asked and answered, would not recommend continuation of PSA screening and/or further evaluation based on screening guidelines  2. Urgency/Urge incontinence Failed beta-3 agonist at the lowest possible dose and experienced some rentention. Therefore I will not perscibed anymore medication from this class.   Patient is not a good candidate for anticholinergic.  I have exhausted all oral therapeutic options, at this time the patient is satisfied with his current symptoms and will follow up as needed.  Third line for urgency urge incontinence was briefly discussed including Botox and PTNS.  Not interested.  3. BPH  Adequate emptying of bladder Unable to complete IPSS due to cognitive state Continue Flomax and Finasteride   RTC as needed.  [facility] [address], [place], [place] [address] [phone]  I, Selena Batten, am acting as a scribe for Dr. Hollice Espy.  I have reviewed the above documentation for accuracy and completeness, and I agree with the above.   Hollice Espy, MD  I spent 30 total minutes on the day of the encounter including pre-visit review of the medical record, face-to-face time with the patient, and post visit ordering of labs/imaging/tests.

## 2020-05-20 ENCOUNTER — Other Ambulatory Visit: Payer: Self-pay

## 2020-05-20 ENCOUNTER — Encounter: Payer: Self-pay | Admitting: Urology

## 2020-05-20 ENCOUNTER — Ambulatory Visit (INDEPENDENT_AMBULATORY_CARE_PROVIDER_SITE_OTHER): Payer: Medicare Other | Admitting: Urology

## 2020-05-20 ENCOUNTER — Other Ambulatory Visit
Admission: RE | Admit: 2020-05-20 | Discharge: 2020-05-20 | Disposition: A | Discharge status: Home / Self Care | Payer: Medicare Other | Attending: Urology | Admitting: Urology

## 2020-05-20 VITALS — BP 122/82 | HR 74 | Ht 70.0 in | Wt 199.0 lb

## 2020-05-20 DIAGNOSIS — N400 Enlarged prostate without lower urinary tract symptoms: Secondary | ICD-10-CM

## 2020-05-20 LAB — URINALYSIS, COMPLETE (UACMP) WITH MICROSCOPIC
Bacteria, UA: NONE SEEN
Bilirubin Urine: NEGATIVE
Glucose, UA: NEGATIVE mg/dL
Hgb urine dipstick: NEGATIVE
Ketones, ur: NEGATIVE mg/dL
Leukocytes,Ua: NEGATIVE
Nitrite: NEGATIVE
Protein, ur: NEGATIVE mg/dL
Specific Gravity, Urine: 1.02 (ref 1.005–1.030)
pH: 7 (ref 5.0–8.0)

## 2020-05-20 LAB — BLADDER SCAN AMB NON-IMAGING: Scan Result: 0

## 2020-06-16 ENCOUNTER — Other Ambulatory Visit: Payer: Self-pay | Admitting: Adult Health

## 2020-06-16 DIAGNOSIS — F411 Generalized anxiety disorder: Secondary | ICD-10-CM

## 2020-06-28 ENCOUNTER — Other Ambulatory Visit: Payer: Self-pay | Admitting: Internal Medicine

## 2020-06-28 DIAGNOSIS — F325 Major depressive disorder, single episode, in full remission: Secondary | ICD-10-CM

## 2020-07-18 ENCOUNTER — Encounter: Payer: Self-pay | Admitting: Internal Medicine

## 2020-07-18 NOTE — Progress Notes (Signed)
History of Present Illness:       This very nice 86 y.o.  MWM presents for 6 month follow up with HTN, HLD, ASHD /pAfib , OSA, Pre-Diabetes and Vitamin D Deficiency. Caretaker wife reports poor short term memory.       Patient is treated for HTN & BP has been controlled at home. Today's BP is at goal -  124/72.  Patient has hx/o pAfib in 2007 and was CV.   He was on Coumadin until d/c'd in 2015 after a spontaneous rectal sheath hematoma. He is on LD bASA 81 mg. Patient has had no complaints of any cardiac type chest pain, palpitations, dyspnea / orthopnea / PND, dizziness, claudication, or dependent edema.      Hyperlipidemia is controlled with diet & meds. Patient denies myalgias or other med SE's. Last Lipids were not at goal & not treated over aggressively for age considerations.   Lab Results  Component Value Date   CHOL 214 (H) 04/13/2020   HDL 58 04/13/2020   LDLCALC 135 (H) 04/13/2020   TRIG 105 04/13/2020   CHOLHDL 3.7 04/13/2020    Also, the patient has history of PreDiabetes and has had no symptoms of reactive hypoglycemia, diabetic polys, paresthesias or visual blurring.  Last A1c was Normal & at goal:  Lab Results  Component Value Date   HGBA1C 5.5 01/11/2020       Further, the patient also has history of Vitamin D Deficiency and supplements vitamin D without any suspected side-effects. Last vitamin D was at goal:  Lab Results  Component Value Date   VD25OH 68 01/11/2020    Current Outpatient Medications on File Prior to Visit  Medication Sig  . aspirin EC 81 MG tablet Take 81 mg by mouth daily.  . Calcium Carbonate Antacid (TUMS E-X PO) Take 2 tablets by mouth daily.  . Cholecalciferol 5000 UNITS TABS Take 1 tablet by mouth daily.  . citalopram (CELEXA) 40 MG tablet Take    1 tablet     Daily     for Mood  . diltiazem (CARDIZEM) 60 MG tablet TAKE 1 TABLET   TWICE DAILY  . finasteride (PROSCAR) 5 MG tablet Take 1 tablet Daily for Prostate  . ketoconazole  (NIZORAL) 2 % shampoo APPLY SHAMPOO  TWICE WEEKLY   . LORazepam (ATIVAN) 2 MG tablet TAKE 1/2 TO 1 TABLET  AT BEDTIME   . Magnesium 200 MG TABS Take 1 tablet by mouth daily.  . Multiple Vitamin (MULTIVITAMIN) capsule Take 1 capsule by mouth daily.  . tamsulosin (FLOMAX) 0.4 MG CAPS capsule Take 1 tablet at Bedtime for Prostate  . vitamin C (ASCORBIC ACID) 500 MG tablet Take 1,000 mg by mouth daily.  . mirtazapine (REMERON) 7.5 MG tablet Take 7.5 mg by mouth at bedtime. (Patient not taking: Reported on 07/19/2020)    Allergies  Allergen Reactions  . Gabapentin     Confusion  . Ciprofloxacin     dysphoria  . Novocain [Procaine Hcl]   . Prednisone Nervousness    PMHx:   Past Medical History:  Diagnosis Date  . Arthritis   . ASHD (arteriosclerotic heart disease)   . Atrial fib/flutter, transient    off coumadin since 11/2013- CHADSVAC of 2  . Cancer (HCC)    BASAL CELL   . Colon polyps    2003 hyperplastic and tubuler adenoma  . Depression   . Diverticulosis   . Dyslipidemia   . Dysrhythmia   .  FHx: heart disease 08/11/2018  . History of kidney stones   . Hypertension   . OSA (obstructive sleep apnea)    NO CPAP IN 10+ YEARS  . Pre-diabetes   . Rectus sheath hematoma 11/21/2013   Dr. Deatra Ina   . 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 DT (Pediatric) 07/06/2014  . Influenza Split 07/20/2014, 07/18/2016  . Influenza, High Dose Seasonal PF 08/22/2015, 07/15/2017  . Influenza-Unspecified 06/15/2018, 08/04/2019  . PFIZER SARS-COV-2 Vaccination 11/12/2019, 12/07/2019  . Pneumococcal Conjugate-13 07/06/2014  . Pneumococcal Polysaccharide-23 06/09/2013  . Td 12/15/2016  . Zoster 10/15/2005    Past Surgical History:  Procedure Laterality Date  . CATARACT EXTRACTION, BILATERAL Bilateral 2019   at the [facility]    . PROSTATE    . TOTAL HIP ARTHROPLASTY Right 03/07/2018    Procedure: RIGHT TOTAL HIP ARTHROPLASTY DIRECT ANTERIOR;  Surgeon: [name], MD;  Location: [facility];  Service: Orthopedics;  Laterality: Right;    FHx:    Reviewed / unchanged  SHx:    Reviewed / unchanged   Systems Review:  Constitutional: Denies fever, chills, wt changes, headaches, insomnia, fatigue, night sweats, change in appetite. Eyes: Denies redness, blurred vision, diplopia, discharge, itchy, watery eyes.  ENT: Denies discharge, congestion, post nasal drip, epistaxis, sore throat, earache, hearing loss, dental pain, tinnitus, vertigo, sinus pain, snoring.  CV: Denies chest pain, palpitations, irregular heartbeat, syncope, dyspnea, diaphoresis, orthopnea, PND, claudication or edema. Respiratory: denies cough, dyspnea, DOE, pleurisy, hoarseness, laryngitis, wheezing.  Gastrointestinal: Denies dysphagia, odynophagia, heartburn, reflux, water brash, abdominal pain or cramps, nausea, vomiting, bloating, diarrhea, constipation, hematemesis, melena, hematochezia  or hemorrhoids. Genitourinary: Denies dysuria, frequency, urgency, nocturia, hesitancy, discharge, hematuria or flank pain. Musculoskeletal: Denies arthralgias, myalgias, stiffness, jt. swelling, pain, limping or strain/sprain.  Skin: Denies pruritus, rash, hives, warts, acne, eczema or change in skin lesion(s). Neuro: No weakness, tremor, incoordination, spasms, paresthesia or pain. Psychiatric: Denies confusion, memory loss or sensory loss. Endo: Denies change in weight, skin or hair change.  Heme/Lymph: No excessive bleeding, bruising or enlarged lymph nodes.  Physical Exam  BP 124/72   Pulse 71   Temp (!) 96.7 F (35.9 C)   Resp 16   Ht 5' 10.5" (1.791 m)   Wt 200 lb 12.8 oz (91.1 kg)   SpO2 96%   BMI 28.40 kg/m   Appears  well nourished, well groomed  and in no distress.  Eyes: PERRLA, EOMs, conjunctiva no swelling or erythema. Sinuses: No frontal/maxillary tenderness ENT/Mouth: EAC's clear, TM's nl w/o  erythema, bulging. Nares clear w/o erythema, swelling, exudates. Oropharynx clear without erythema or exudates. Oral hygiene is good. Tongue normal, non obstructing. Hearing intact.  Neck: Supple. Thyroid not palpable. Car 2+/2+ without bruits, nodes or JVD. Chest: Respirations nl with BS clear & equal w/o rales, rhonchi, wheezing or stridor.  Cor: Heart sounds normal w/ regular rate and rhythm without sig. murmurs, gallops, clicks or rubs. Peripheral pulses normal and equal  without edema.  Abdomen: Soft & bowel sounds normal. Non-tender w/o guarding, rebound, hernias, masses or organomegaly.  Lymphatics: Unremarkable.  Musculoskeletal: Full ROM all peripheral extremities, joint stability, 5/5 strength and normal gait.  Skin: Warm, dry without exposed rashes, lesions or ecchymosis apparent.  Neuro: Cranial nerves intact, reflexes equal bilaterally. Sensory-motor testing grossly intact. Tendon reflexes grossly intact.  Pysch: Alert & oriented x 3.  Insight and judgement nl &  appropriate. No ideations.  Assessment and Plan:  1. Essential hypertension  - Continue medication, monitor blood pressure at home.  - Continue DASH diet.  Reminder to go to the ER if any CP,  SOB, nausea, dizziness, severe HA, changes vision/speech.  - CBC with Differential/Platelet - COMPLETE METABOLIC PANEL WITH GFR - Magnesium  2. Hyperlipidemia, mixed  - Continue diet/meds, exercise,& lifestyle modifications.  - Continue monitor periodic cholesterol/liver & renal functions   - TSH  3. Glucose intolerance  - Continue diet, exercise  - Lifestyle modifications.  - Monitor appropriate labs.   - Hemoglobin A1c  4. Vitamin D deficiency  - Continue supplementation.  - VITAMIN D 25 Hydroxy  5. Paroxysmal atrial fibrillation (HCC)  - TSH  6. ASHD (arteriosclerotic heart disease)   7. Medication management  - CBC with Differential/Platelet - COMPLETE METABOLIC PANEL WITH GFR - Magnesium - TSH -  Hemoglobin A1c - VITAMIN D 25 Hydroxy        Discussed  regular exercise, BP monitoring, weight control to achieve/maintain BMI less than 25 and discussed med and SE's. Recommended labs to assess and monitor clinical status with further disposition pending results of labs.  I discussed the assessment and treatment plan with the patient. The patient was provided an opportunity to ask questions and all were answered. The patient agreed with the plan and demonstrated an understanding of the instructions.  I provided over 30 minutes of exam, counseling, chart review and  complex critical decision making.   Kirtland Bouchard, MD

## 2020-07-18 NOTE — Patient Instructions (Signed)

## 2020-07-19 ENCOUNTER — Ambulatory Visit (INDEPENDENT_AMBULATORY_CARE_PROVIDER_SITE_OTHER): Payer: Medicare Other | Admitting: Internal Medicine

## 2020-07-19 ENCOUNTER — Other Ambulatory Visit: Payer: Self-pay

## 2020-07-19 VITALS — BP 124/72 | HR 71 | Temp 96.7°F | Resp 16 | Ht 70.5 in | Wt 200.8 lb

## 2020-07-19 DIAGNOSIS — Z79899 Other long term (current) drug therapy: Secondary | ICD-10-CM | POA: Diagnosis not present

## 2020-07-19 DIAGNOSIS — I480 Paroxysmal atrial fibrillation: Secondary | ICD-10-CM

## 2020-07-19 DIAGNOSIS — I10 Essential (primary) hypertension: Secondary | ICD-10-CM

## 2020-07-19 DIAGNOSIS — E782 Mixed hyperlipidemia: Secondary | ICD-10-CM | POA: Diagnosis not present

## 2020-07-19 DIAGNOSIS — E7439 Other disorders of intestinal carbohydrate absorption: Secondary | ICD-10-CM | POA: Diagnosis not present

## 2020-07-19 DIAGNOSIS — E559 Vitamin D deficiency, unspecified: Secondary | ICD-10-CM | POA: Diagnosis not present

## 2020-07-19 DIAGNOSIS — I2510 Atherosclerotic heart disease of native coronary artery without angina pectoris: Secondary | ICD-10-CM

## 2020-07-20 LAB — COMPLETE METABOLIC PANEL WITH GFR
AG Ratio: 1.9 (calc) (ref 1.0–2.5)
ALT: 11 U/L (ref 9–46)
AST: 16 U/L (ref 10–35)
Albumin: 4 g/dL (ref 3.6–5.1)
Alkaline phosphatase (APISO): 68 U/L (ref 35–144)
BUN: 13 mg/dL (ref 7–25)
CO2: 30 mmol/L (ref 20–32)
Calcium: 9.7 mg/dL (ref 8.6–10.3)
Chloride: 104 mmol/L (ref 98–110)
Creat: 0.94 mg/dL (ref 0.70–1.11)
GFR, Est African American: 82 mL/min/{1.73_m2} (ref >=60)
GFR, Est Non African American: 71 mL/min/{1.73_m2} (ref >=60)
Globulin: 2.1 g/dL (calc) (ref 1.9–3.7)
Glucose, Bld: 89 mg/dL (ref 65–99)
Potassium: 4.3 mmol/L (ref 3.5–5.3)
Sodium: 140 mmol/L (ref 135–146)
Total Bilirubin: 0.4 mg/dL (ref 0.2–1.2)
Total Protein: 6.1 g/dL (ref 6.1–8.1)

## 2020-07-20 LAB — CBC WITH DIFFERENTIAL/PLATELET
Absolute Monocytes: 722 cells/uL (ref 200–950)
Basophils Absolute: 17 cells/uL (ref 0–200)
Basophils Relative: 0.2 %
Eosinophils Absolute: 227 cells/uL (ref 15–500)
Eosinophils Relative: 2.7 %
HCT: 47.4 % (ref 38.5–50.0)
Hemoglobin: 16.3 g/dL (ref 13.2–17.1)
Lymphs Abs: 1873 cells/uL (ref 850–3900)
MCH: 32.6 pg (ref 27.0–33.0)
MCHC: 34.4 g/dL (ref 32.0–36.0)
MCV: 94.8 fL (ref 80.0–100.0)
MPV: 9.6 fL (ref 7.5–12.5)
Monocytes Relative: 8.6 %
Neutro Abs: 5561 cells/uL (ref 1500–7800)
Neutrophils Relative %: 66.2 %
Platelets: 243 10*3/uL (ref 140–400)
RBC: 5 10*6/uL (ref 4.20–5.80)
RDW: 12.2 % (ref 11.0–15.0)
Total Lymphocyte: 22.3 %
WBC: 8.4 10*3/uL (ref 3.8–10.8)

## 2020-07-20 LAB — TSH: TSH: 2.22 mIU/L (ref 0.40–4.50)

## 2020-07-20 LAB — HEMOGLOBIN A1C
Hgb A1c MFr Bld: 5.4 % of total Hgb (ref <5.7)
Mean Plasma Glucose: 108 (calc)
eAG (mmol/L): 6 (calc)

## 2020-07-20 LAB — MAGNESIUM: Magnesium: 2.2 mg/dL (ref 1.5–2.5)

## 2020-07-20 LAB — VITAMIN D 25 HYDROXY (VIT D DEFICIENCY, FRACTURES): Vit D, 25-Hydroxy: 71 ng/mL (ref 30–100)

## 2020-07-20 NOTE — Progress Notes (Signed)
========================================================== -   Test results slightly outside the reference range are not unusual. If there is anything important, I will review this with you,  otherwise it is considered normal test values.  If you have further questions,  please do not hesitate to contact me at the office or via My Chart.  ==========================================================  -  A1c - Normal - Great - No Diabetes ! ==========================================================  -  Vitamin D = 71 - excellent  ==========================================================  -  All Else - CBC - Kidneys - Electrolytes - Liver & Thyroid    - all  Normal / OK ==========================================================   - Please continue meds same  ==========================================================

## 2020-08-17 ENCOUNTER — Other Ambulatory Visit: Payer: Self-pay | Admitting: Adult Health

## 2020-08-17 DIAGNOSIS — F411 Generalized anxiety disorder: Secondary | ICD-10-CM

## 2020-09-21 ENCOUNTER — Other Ambulatory Visit: Payer: Self-pay | Admitting: Internal Medicine

## 2020-09-21 DIAGNOSIS — F325 Major depressive disorder, single episode, in full remission: Secondary | ICD-10-CM

## 2020-09-21 DIAGNOSIS — N400 Enlarged prostate without lower urinary tract symptoms: Secondary | ICD-10-CM

## 2020-09-28 ENCOUNTER — Other Ambulatory Visit: Payer: Self-pay | Admitting: Adult Health

## 2020-09-28 DIAGNOSIS — F411 Generalized anxiety disorder: Secondary | ICD-10-CM

## 2020-10-19 ENCOUNTER — Ambulatory Visit: Payer: Medicare Other | Admitting: Adult Health Nurse Practitioner

## 2020-11-09 ENCOUNTER — Encounter: Payer: Self-pay | Admitting: Family

## 2020-11-11 ENCOUNTER — Encounter: Payer: Self-pay | Admitting: Family

## 2020-11-11 ENCOUNTER — Telehealth (INDEPENDENT_AMBULATORY_CARE_PROVIDER_SITE_OTHER): Payer: Medicare Other | Admitting: Family

## 2020-11-11 VITALS — BP 128/70 | HR 63 | Ht 70.0 in | Wt 197.1 lb

## 2020-11-11 DIAGNOSIS — I480 Paroxysmal atrial fibrillation: Secondary | ICD-10-CM | POA: Diagnosis not present

## 2020-11-11 DIAGNOSIS — I10 Essential (primary) hypertension: Secondary | ICD-10-CM | POA: Diagnosis not present

## 2020-11-11 DIAGNOSIS — E782 Mixed hyperlipidemia: Secondary | ICD-10-CM | POA: Diagnosis not present

## 2020-11-11 DIAGNOSIS — I952 Hypotension due to drugs: Secondary | ICD-10-CM

## 2020-11-11 MED ORDER — DILTIAZEM HCL 30 MG PO TABS: 30.0000 mg | ORAL_TABLET | Freq: Two times a day (BID) | ORAL | 1 refills | Status: DC

## 2020-11-11 NOTE — Patient Instructions (Addendum)
Medication Instructions:  Your provider has recommended you make the following change in your medication:   CHANGE Diltiazem (Cardizem) to 30mg  one tablet twice daily *A new prescription has been sent into [facility]. Until it arrives, you may split the 60mg  tablets in half*  *If you need a refill on your cardiac medications before your next appointment, please call your pharmacy*   Lab Work: None ordered today.   Testing/Procedures: None ordered today.  Follow-Up: At [facility], you and your health needs are our priority.  As part of our continuing mission to provide you with exceptional heart care, we have created designated Provider Care Teams.  These Care Teams include your primary Cardiologist (physician) and Advanced Practice Providers (APPs -  Physician Assistants and Nurse Practitioners) who all work together to provide you with the care you need, when you need it.  We recommend signing up for the patient portal called "MyChart".  Sign up information is provided on this After Visit Summary.  MyChart is used to connect with patients for Virtual Visits (Telemedicine).  Patients are able to view lab/test results, encounter notes, upcoming appointments, etc.  Non-urgent messages can be sent to your provider as well.   To learn more about what you can do with MyChart, go to [email or web address].    Your next appointment:   June 2022 in person with Dr. Rockey Situ    Other Instructions  It was a pleasure to "meet" you today via video visit! Your Diltiazem, Finasteride, and Tamsulosin all can lower your blood pressure. We have reduced your dose of Diltiazem further in the hopes that it will get your blood pressure up and get you feeling much better.  We worry about a blood pressure being "too low" if that individual feels fatigued, lightheaded, or dizzy.   Keep up the good work staying well hydrated as this helps prevent low blood pressure.   Loel Dubonnet, NP will send you a  MyChart message in 1 week to check in on your symptoms and blood pressure after the medication change. Don't hesitate to reach out sooner if you have questions or concerns.   To prevent your blood pressure from dropping when changing positions, recommend making position changes slowly, staying well hydrated, and wearing compression stockings.

## 2020-11-11 NOTE — Progress Notes (Signed)
Virtual Visit via Video Note   This visit type was conducted due to national recommendations for restrictions regarding the COVID-19 Pandemic (e.g. social distancing) in an effort to limit this patient's exposure and mitigate transmission in our community.  Due to his co-morbid illnesses, this patient is at least at moderate risk for complications without adequate follow up.  This format is felt to be most appropriate for this patient at this time.  All issues noted in this document were discussed and addressed.  A limited physical exam was performed with this format.  Please refer to the patient's chart for his consent to telehealth for [facility].      Date:  11/11/2020   ID:  [name] Yu, DOB 11/04/1926, MRN [id number] The patient was identified using 2 identifiers.  Patient Location: Home Provider Location: Office/Clinic  PCP:  [name], MD  Cardiologist:  Ida Rogue, MD  Electrophysiologist:  None   Evaluation Performed:  Follow-Up Visit  Chief Complaint:  Hypotension  History of Present Illness:    [name] Yu is a 87 y.o. male with paroxysmal atrial fibrillation, coronary calcification on CT scan, hypertension, hyperlipidemia, sleep apnea, prior tobacco use, falls, previous bradycardia on beta-blockers.  He was last seen by Dr. Rockey Situ 04/11/2020.  Previous cardiac testing includes normal cardiac catheterization in 2003.  Normal stress test in 2005 at the [facility].  October 2007 he had atrial fibrillation work-up at [facility] and was cardioverted and placed on low dose diltiazem.  He had another stress test in 2007 which was normal.  He contact the office 11/09/2020 via MyChart to report low blood pressure, fatigue.  Readings from wrist cuff were 97/44 -112/62 and by home health nurse [phone].  He had reduced his diltiazem to 1 tablet in the morning and half at night.  Video visit with him and his wife. Endorses feeling a little lightheaded. This has improved  since reducing Diltiazem to 60mg  AM and 30mg  PM. BP now routinely in the 110s wjem checked at 11am and 5pm. Endorses staying well hydrated. No near syncope nor syncope.   The patient does not have symptoms concerning for COVID-19 infection (fever, chills, cough, or new shortness of breath).    Past Medical History:  Diagnosis Date  . Arthritis   . ASHD (arteriosclerotic heart disease)   . Atrial fib/flutter, transient    off coumadin since 11/2013- CHADSVAC of 2  . Cancer (HCC)    BASAL CELL   . Colon polyps    2003 hyperplastic and tubuler adenoma  . Depression   . Diverticulosis   . Dyslipidemia   . Dysrhythmia   . FHx: heart disease 08/11/2018  . History of kidney stones   . Hypertension   . OSA (obstructive sleep apnea)    NO CPAP IN 10+ YEARS  . Pre-diabetes   . Rectus sheath hematoma 11/21/2013   Dr. Deatra Ina   . 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 CATARACT EXTRACTION, BILATERAL Bilateral 2019   at the [facility]    . PROSTATE    . TOTAL HIP ARTHROPLASTY Right 03/07/2018   Procedure: RIGHT TOTAL HIP ARTHROPLASTY DIRECT ANTERIOR;  Surgeon: [name], MD;  Location: [place];  Service: Orthopedics;  Laterality: Right;     No outpatient medications have been marked as taking for the 11/11/20 encounter (Appointment) with Loel Dubonnet, NP.  Allergies:   Gabapentin, Ciprofloxacin, Novocain [procaine hcl], and Prednisone   Social History   Tobacco Use  . Smoking status: Former Smoker    Types: Pipe    Quit date: 09/14/1993    Years since quitting: 27.1  . Smokeless tobacco: Never Used  Vaping Use  . Vaping Use: Never used  Substance Use Topics  . Alcohol use: No  . Drug use: No     Family Hx: The patient's family history includes Alzheimer's disease in his mother; Breast cancer in his daughter and sister; Diabetes in his brother; Heart disease in his brother  and sister; Kidney cancer in his father; Rectal cancer in his sister; Ulcerative colitis in his sister.  ROS:   Please see the history of present illness.     All other systems reviewed and are negative.    Labs/Other Tests and Data Reviewed:    EKG:  No ECG reviewed.  Recent Labs: 07/19/2020: ALT 11; BUN 13; Creat 0.94; Hemoglobin 16.3; Magnesium 2.2; Platelets 243; Potassium 4.3; Sodium 140; TSH 2.22   Recent Lipid Panel Lab Results  Component Value Date/Time   CHOL 214 (H) 04/13/2020 03:31 PM   TRIG 105 04/13/2020 03:31 PM   HDL 58 04/13/2020 03:31 PM   CHOLHDL 3.7 04/13/2020 03:31 PM   LDLCALC 135 (H) 04/13/2020 03:31 PM    Wt Readings from Last 3 Encounters:  07/19/20 200 lb 12.8 oz (91.1 kg)  05/20/20 199 lb (90.3 kg)  04/13/20 198 lb 12.8 oz (90.2 kg)     Objective:    Vital Signs:  There were no vitals taken for this visit.   VITAL SIGNS:  reviewed GEN:  no acute distress RESPIRATORY:  normal respiratory effort, symmetric expansion CARDIOVASCULAR:  no peripheral edema  ASSESSMENT & PLAN:    1. Hypotension- Likely due to medications. Discussed that Flomax and Proscar could also be contributory. Symptoms of lightheadedness and fatigue improved since Diltiazem reduced to 60mg  AM and 30mg  PM. Will further reduce to Diltiazem 30mg  BID. Check in via MyChart message in one week. If BP remains symptomatically low could reduce further versus reducing dose of Flomax/Proscar. Encouraged to stay well hydrated, make position changes slowly.  2. Hyperlipidemia-politely declined statins.  Prior CT scan 2008 showed coronary calcification.  3. Paroxysmal atrial fibrillation-No evidence of recurrence. Remote episode not on anticoagulation. Change Diltiazem from 60mg  BID to 30mg  BID due to hypotension.   4. Coronary artery calcification on CT scan-No anginal symptoms. No indication for ischemic evaluation at this time. Declines statin, as above.  5. OSA- CPAP compliance  encouraged.  Time:   Today, I have spent 15 minutes with the patient with telehealth technology discussing the above problems.     Medication Adjustments/Labs and Tests Ordered: Current medicines are reviewed at length with the patient today.  Concerns regarding medicines are outlined above.   Tests Ordered: No orders of the defined types were placed in this encounter.   Medication Changes: No orders of the defined types were placed in this encounter.   Follow Up:  In Person in June 2022 with Dr. Rockey Situ or APP  Signed, Loel Dubonnet, NP  11/11/2020 12:45 PM    [place]

## 2020-11-18 ENCOUNTER — Encounter: Payer: Self-pay | Admitting: Family

## 2020-11-24 ENCOUNTER — Telehealth: Payer: Self-pay | Admitting: Family

## 2020-11-24 NOTE — Telephone Encounter (Signed)
As patient had not responded to MyChart message from 11/18/20 checking on blood pressure, called to check on blood pressure since reduced dose of Diltiazem. No answer, left VM on cell phone per DPR to let him know we were calling to check on his blood pressure and to call us back if he was still having low BP readings.   Loel Dubonnet, NP

## 2020-12-20 ENCOUNTER — Other Ambulatory Visit: Payer: Self-pay | Admitting: Internal Medicine

## 2020-12-20 DIAGNOSIS — N401 Enlarged prostate with lower urinary tract symptoms: Secondary | ICD-10-CM

## 2021-01-18 ENCOUNTER — Encounter: Payer: Medicare Other | Admitting: Internal Medicine

## 2021-03-02 ENCOUNTER — Other Ambulatory Visit: Payer: Self-pay

## 2021-03-02 ENCOUNTER — Telehealth: Payer: Self-pay | Admitting: *Deleted

## 2021-03-02 ENCOUNTER — Ambulatory Visit (INDEPENDENT_AMBULATORY_CARE_PROVIDER_SITE_OTHER): Payer: Medicare Other | Admitting: Internal Medicine

## 2021-03-02 VITALS — BP 120/78 | HR 83 | Temp 97.5°F | Resp 16 | Ht 70.0 in | Wt 195.6 lb

## 2021-03-02 DIAGNOSIS — I480 Paroxysmal atrial fibrillation: Secondary | ICD-10-CM | POA: Diagnosis not present

## 2021-03-02 DIAGNOSIS — I2583 Coronary atherosclerosis due to lipid rich plaque: Secondary | ICD-10-CM | POA: Diagnosis not present

## 2021-03-02 DIAGNOSIS — Z125 Encounter for screening for malignant neoplasm of prostate: Secondary | ICD-10-CM

## 2021-03-02 DIAGNOSIS — I10 Essential (primary) hypertension: Secondary | ICD-10-CM

## 2021-03-02 DIAGNOSIS — E7439 Other disorders of intestinal carbohydrate absorption: Secondary | ICD-10-CM

## 2021-03-02 DIAGNOSIS — Z0001 Encounter for general adult medical examination with abnormal findings: Secondary | ICD-10-CM

## 2021-03-02 DIAGNOSIS — Z79899 Other long term (current) drug therapy: Secondary | ICD-10-CM | POA: Diagnosis not present

## 2021-03-02 DIAGNOSIS — E559 Vitamin D deficiency, unspecified: Secondary | ICD-10-CM

## 2021-03-02 DIAGNOSIS — Z136 Encounter for screening for cardiovascular disorders: Secondary | ICD-10-CM

## 2021-03-02 DIAGNOSIS — Z87891 Personal history of nicotine dependence: Secondary | ICD-10-CM | POA: Diagnosis not present

## 2021-03-02 DIAGNOSIS — Z0000 Encounter for general adult medical examination without abnormal findings: Secondary | ICD-10-CM

## 2021-03-02 DIAGNOSIS — E782 Mixed hyperlipidemia: Secondary | ICD-10-CM

## 2021-03-02 DIAGNOSIS — Z8249 Family history of ischemic heart disease and other diseases of the circulatory system: Secondary | ICD-10-CM | POA: Diagnosis not present

## 2021-03-02 DIAGNOSIS — Z1211 Encounter for screening for malignant neoplasm of colon: Secondary | ICD-10-CM

## 2021-03-02 DIAGNOSIS — N401 Enlarged prostate with lower urinary tract symptoms: Secondary | ICD-10-CM

## 2021-03-02 DIAGNOSIS — I2510 Atherosclerotic heart disease of native coronary artery without angina pectoris: Secondary | ICD-10-CM | POA: Diagnosis not present

## 2021-03-02 MED ORDER — NEOMYCIN-POLYMYXIN-DEXAMETH 3.5-10000-0.1 OP SUSP
OPHTHALMIC | 1 refills | Status: DC
Start: 2021-03-02 — End: 2021-08-31

## 2021-03-02 MED ORDER — MELOXICAM 15 MG PO TABS
ORAL_TABLET | ORAL | 3 refills | Status: DC
Start: 2021-03-02 — End: 2023-06-18

## 2021-03-02 MED ORDER — NEOMYCIN-POLYMYXIN-DEXAMETH 3.5-10000-0.1 OP OINT
TOPICAL_OINTMENT | OPHTHALMIC | 1 refills | Status: DC
Start: 2021-03-02 — End: 2021-08-31

## 2021-03-02 MED ORDER — DOXYCYCLINE HYCLATE 100 MG PO CAPS: ORAL_CAPSULE | ORAL | 0 refills | Status: DC

## 2021-03-02 NOTE — Progress Notes (Signed)
Annual  Screening/Preventative Visit  & Comprehensive Evaluation & Examination  Future Appointments  Date Time Provider [place]  03/02/2021  2:00 PM [name], MD [facility] None  04/18/2021  2:40 PM Minna Merritts, MD [facility]  03/05/2022 11:00 AM [name], MD [facility] None            This very nice 87 y.o. MWM presents for a Screening /Preventative Visit & comprehensive evaluation and management of multiple medical co-morbidities.  Patient has been followed for HTN, HLD, T2_NIDDM  Prediabetes and Vitamin D Deficiency.       HTN predates since 2005. Patient's BP has been controlled at home.  Today's BP is at goal  - 120/78.  In 2007, patient was dx'd with pAfib  and started on Coumadin in 2007 and then stopped when he developed a large spontaneous rectus sheath hematom .  In 2003, patient had a negative Heart Cath  and  had negative Cardiolitesin 2005 &2007.   Patient denies any cardiac symptoms as chest pain, palpitations, shortness of breath, dizziness or ankle swelling.       Patient's hyperlipidemia is controlled with diet. Last lipids were still not at goal:  Lab Results  Component Value Date   CHOL 214 (H) 04/13/2020   HDL 58 04/13/2020   LDLCALC 135 (H) 04/13/2020   TRIG 105 04/13/2020   CHOLHDL 3.7 04/13/2020        Patient has hx/o T2_NIDDM prediabetes since    and patient denies reactive hypoglycemic symptoms, visual blurring, diabetic polys or paresthesias. Last A1c was         Finally, patient has history of Vitamin D Deficiency ("33" / 2008) and last vitamin D was at goal:   Lab Results  Component Value Date   VD25OH 71 07/19/2020    Current Outpatient Medications on File Prior to Visit  Medication Sig  . aspirin EC 81 MG tablet Take 81 mg by mouth daily.  . Calcium Carbonate Antacid (TUMS E-X PO) Take 2 tablets by mouth daily.  . Cholecalciferol 5000 UNITS TABS Take 1 tablet by mouth daily.  . citalopram  (CELEXA) 40 MG tablet TAKE 1 TABLET BY MOUTH  DAILY FOR MOOD  . diltiazem (CARDIZEM) 30 MG tablet Take 1 tablet (30 mg total) by mouth 2 (two) times daily.  . finasteride (PROSCAR) 5 MG tablet TAKE 1 TABLET BY MOUTH  DAILY FOR PROSTATE  . ketoconazole (NIZORAL) 2 % shampoo APPLY SHAMPOO TOPICALLY  TWICE WEEKLY AS DIRECTED  . Magnesium 200 MG TABS Take 1 tablet by mouth daily.  . mirtazapine (REMERON) 7.5 MG tablet Take 7.5 mg by mouth at bedtime.  . Multiple Vitamin (MULTIVITAMIN) capsule Take 1 capsule by mouth daily.  . tamsulosin (FLOMAX) 0.4 MG CAPS capsule TAKE 1 CAPSULE BY MOUTH AT  BEDTIME FOR PROSTATE  . vitamin C (ASCORBIC ACID) 500 MG tablet Take 1,000 mg by mouth daily.      Allergies  Allergen Reactions  . Gabapentin     Confusion   . Ciprofloxacin     dysphoria  . Novocain [Procaine Hcl]   . Prednisone     Nervousness     Past Medical History:  Diagnosis Date  . Arthritis   . ASHD (arteriosclerotic heart disease)   . Atrial fib/flutter, transient    off coumadin since 11/2013- CHADSVAC of 2  . Cancer (HCC)    BASAL CELL   . Colon polyps    2003 hyperplastic and tubuler adenoma  .  Depression   . Diverticulosis   . Dyslipidemia   . Dysrhythmia   . FHx: heart disease 08/11/2018  . History of kidney stones   . Hypertension   . OSA (obstructive sleep apnea)    NO CPAP IN 10+ YEARS  . Pre-diabetes   . Rectus sheath hematoma 11/21/2013   Dr. Deatra Ina   . 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 COVID-19 Vaccine (4 - Booster for [place] series) 10/29/2020  . INFLUENZA VACCINE  05/15/2021  . TETANUS/TDAP  12/16/2026  . PNA vac Low Risk Adult  Completed  . HPV VACCINES  Aged Out     Immunization History  Administered Date(s) Administered  . DT (Pediatric) 07/06/2014  . Influenza Split 07/20/2014, 07/18/2016  . Influenza, High Dose Seasonal PF 08/22/2015, 07/15/2017  . Influenza-Unspecified  06/15/2018, 08/04/2019, 07/14/2020  . PFIZER(Purple Top)SARS-COV-2 Vaccination 11/12/2019, 12/07/2019, 07/29/2020  . Pneumococcal Conjugate-13 07/06/2014  . Pneumococcal Polysaccharide-23 06/09/2013  . Td 12/15/2016  . Zoster 10/15/2005    Last Colon -   Past Surgical History:  Procedure Laterality Date  . CATARACT EXTRACTION, BILATERAL Bilateral 2019   at the [facility]    . PROSTATE    . TOTAL HIP ARTHROPLASTY Right 03/07/2018   Procedure: RIGHT TOTAL HIP ARTHROPLASTY DIRECT ANTERIOR;  Surgeon: [name], MD;  Location: [place];  Service: Orthopedics;  Laterality: Right;     Family History  Problem Relation Age of Onset  . Alzheimer's disease Mother   . Kidney cancer Father   . Diabetes Brother   . Breast cancer Sister   . Rectal cancer Sister   . Breast cancer Daughter   . Ulcerative colitis Sister   . Heart disease Brother   . Heart disease Sister     Social History   Socioeconomic History  . Marital status: Married    Spouse name: Not on file  . Number of children: 5  Occupational History  . Occupation: Retired  Tobacco Use  . Smoking status: Former Smoker    Types: Pipe    Quit date: 09/14/1993    Years since quitting: 27.4  . Smokeless tobacco: Never Used  Vaping Use  . Vaping Use: Never used  Substance and Sexual Activity  . Alcohol use: No  . Drug use: No  . Sexual activity: Not on file    ROS Constitutional: Denies fever, chills, weight loss/gain, headaches, insomnia,  night sweats or change in appetite. Does c/o fatigue. Eyes: Denies redness, blurred vision, diplopia, discharge, itchy or watery eyes.  ENT: Denies discharge, congestion, post nasal drip, epistaxis, sore throat, earache, hearing loss, dental pain, Tinnitus, Vertigo, Sinus pain or snoring.  Cardio: Denies chest pain, palpitations, irregular heartbeat, syncope, dyspnea, diaphoresis, orthopnea, PND, claudication or edema Respiratory: denies cough, dyspnea,  DOE, pleurisy, hoarseness, laryngitis or wheezing.  Gastrointestinal: Denies dysphagia, heartburn, reflux, water brash, pain, cramps, nausea, vomiting, bloating, diarrhea, constipation, hematemesis, melena, hematochezia, jaundice or hemorrhoids Genitourinary: Denies dysuria, frequency, urgency, nocturia, hesitancy, discharge, hematuria or flank pain Musculoskeletal: Denies arthralgia, myalgia, stiffness, Jt. Swelling, pain, limp or strain/sprain. Denies Falls. Skin: Denies puritis, rash, hives, warts, acne, eczema or change in skin lesion Neuro: No weakness, tremor, incoordination, spasms, paresthesia or pain Psychiatric: Denies confusion, memory loss or sensory loss. Denies Depression. Endocrine: Denies change in weight, skin, hair change, nocturia, and paresthesia, diabetic polys, visual blurring or hyper / hypo glycemic episodes.  Heme/Lymph: No excessive bleeding, bruising or enlarged lymph nodes.   Physical Exam  BP 120/78   Pulse 83   Temp (!) 97.5 F (36.4 C)   Resp 16   Ht 5\' 10"  (1.778 m)   Wt 195 lb 9.6 oz (88.7 kg)   SpO2 99%   BMI 28.07 kg/m   General Appearance: Well nourished and well groomed and in no apparent distress.  Eyes: PERRLA, EOMs, conjunctiva no swelling or erythema, normal fundi and vessels. Sinuses: No frontal/maxillary tenderness ENT/Mouth: EACs patent / TMs  nl. Nares clear without erythema, swelling, mucoid exudates. Oral hygiene is good. No erythema, swelling, or exudate. Tongue normal, non-obstructing. Tonsils not swollen or erythematous. Hearing normal.  Neck: Supple, thyroid not palpable. No bruits, nodes or JVD. Respiratory: Respiratory effort normal.  BS equal and clear bilateral without rales, rhonci, wheezing or stridor. Cardio: Heart sounds are normal with regular rate and rhythm and no murmurs, rubs or gallops. Peripheral pulses are normal and equal bilaterally without edema. No aortic or femoral bruits. Chest: symmetric with normal excursions  and percussion.  Abdomen: Soft, with Nl bowel sounds. Nontender, no guarding, rebound, hernias, masses, or organomegaly.  Lymphatics: Non tender without lymphadenopathy.  Musculoskeletal: Full ROM all peripheral extremities, joint stability, 5/5 strength, and normal gait. Skin: Warm and dry without rashes, lesions, cyanosis, clubbing or  ecchymosis.  Neuro: Cranial nerves intact, reflexes equal bilaterally. Normal muscle tone, no cerebellar symptoms. Sensation intact.  Pysch: Alert and oriented X 3 with normal affect, insight and judgment appropriate.   Assessment and Plan  1. Annual Preventative/Screening Exam   2. Essential hypertension  - EKG 12-Lead - [place], RETROPERITNL ABD,  LTD - Urinalysis, Routine w reflex microscopic - Microalbumin / creatinine urine ratio - CBC with Differential/Platelet - COMPLETE METABOLIC PANEL WITH GFR - Magnesium - TSH  3. Hyperlipidemia, mixed  - EKG 12-Lead - [place], RETROPERITNL ABD,  LTD - Lipid panel - TSH  4. Glucose intolerance  - EKG 12-Lead - [place], RETROPERITNL ABD,  LTD - Hemoglobin A1c - Insulin, random  5. Vitamin D deficiency  - VITAMIN D 25 Hydroxy  6. Paroxysmal atrial fibrillation (HCC)  - EKG 12-Lead - TSH  7. Coronary artery disease due to lipid rich plaque  - EKG 12-Lead - Lipid panel  8. Benign localized prostatic hyperplasia with  lower urinary tract symptoms (LUTS)  - PSA  9. Screening for colorectal cancer  - POC Hemoccult Bld/Stl  10. Prostate cancer screening  - PSA  11. Screening for ischemic heart disease  - EKG 12-Lead  12. FHx: heart disease  - EKG 12-Lead - [place], RETROPERITNL ABD,  LTD  13. Former smoker  - EKG 12-Lead - [place], RETROPERITNL ABD,  LTD  14. Screening for AAA (aortic abdominal aneurysm)  - [place], RETROPERITNL ABD,  LTD  15. Medication management  - Urinalysis, Routine w reflex microscopic - Microalbumin / creatinine urine ratio - CBC with Differential/Platelet - COMPLETE  METABOLIC PANEL WITH GFR - Magnesium - Lipid panel - TSH - Hemoglobin A1c - Insulin, random - VITAMIN D 25 Hydroxy       Patient was counseled in prudent diet, weight control to achieve/maintain BMI less than 25, BP monitoring, regular exercise and medications as discussed.  Discussed med effects and SE's. Routine screening labs and tests as requested with regular follow-up as recommended. Over 40 minutes of exam, counseling, chart review and high complex critical decision making was performed   Kirtland Bouchard, MD

## 2021-03-02 NOTE — Progress Notes (Signed)
AortaScan < 3 cm. Within normal limits, per Dr McKeown. 

## 2021-03-02 NOTE — Telephone Encounter (Signed)
error 

## 2021-03-02 NOTE — Patient Instructions (Signed)

## 2021-03-03 LAB — COMPLETE METABOLIC PANEL WITH GFR
AG Ratio: 1.6 (calc) (ref 1.0–2.5)
ALT: 8 U/L — ABNORMAL LOW (ref 9–46)
AST: 15 U/L (ref 10–35)
Albumin: 3.9 g/dL (ref 3.6–5.1)
Alkaline phosphatase (APISO): 71 U/L (ref 35–144)
BUN: 13 mg/dL (ref 7–25)
CO2: 27 mmol/L (ref 20–32)
Calcium: 9.9 mg/dL (ref 8.6–10.3)
Chloride: 103 mmol/L (ref 98–110)
Creat: 0.99 mg/dL (ref 0.70–1.11)
GFR, Est African American: 76 mL/min/{1.73_m2} (ref >=60)
GFR, Est Non African American: 66 mL/min/{1.73_m2} (ref >=60)
Globulin: 2.5 g/dL (calc) (ref 1.9–3.7)
Glucose, Bld: 88 mg/dL (ref 65–99)
Potassium: 4.3 mmol/L (ref 3.5–5.3)
Sodium: 140 mmol/L (ref 135–146)
Total Bilirubin: 0.4 mg/dL (ref 0.2–1.2)
Total Protein: 6.4 g/dL (ref 6.1–8.1)

## 2021-03-03 LAB — URINALYSIS, ROUTINE W REFLEX MICROSCOPIC
Bilirubin Urine: NEGATIVE
Glucose, UA: NEGATIVE
Nitrite: NEGATIVE
Specific Gravity, Urine: 1.017 (ref 1.001–1.035)
Squamous Epithelial / HPF: NONE SEEN /HPF (ref <=5)
pH: 6 (ref 5.0–8.0)

## 2021-03-03 LAB — MICROSCOPIC MESSAGE

## 2021-03-03 LAB — TSH: TSH: 2.67 mIU/L (ref 0.40–4.50)

## 2021-03-03 LAB — HEMOGLOBIN A1C
Hgb A1c MFr Bld: 5.6 % of total Hgb (ref <5.7)
Mean Plasma Glucose: 114 mg/dL
eAG (mmol/L): 6.3 mmol/L

## 2021-03-03 LAB — CBC WITH DIFFERENTIAL/PLATELET
Absolute Monocytes: 919 cells/uL (ref 200–950)
Basophils Absolute: 30 cells/uL (ref 0–200)
Basophils Relative: 0.3 %
Eosinophils Absolute: 364 cells/uL (ref 15–500)
Eosinophils Relative: 3.6 %
HCT: 43.1 % (ref 38.5–50.0)
Hemoglobin: 14 g/dL (ref 13.2–17.1)
Lymphs Abs: 2444 cells/uL (ref 850–3900)
MCH: 27.4 pg (ref 27.0–33.0)
MCHC: 32.5 g/dL (ref 32.0–36.0)
MCV: 84.3 fL (ref 80.0–100.0)
MPV: 9.2 fL (ref 7.5–12.5)
Monocytes Relative: 9.1 %
Neutro Abs: 6343 cells/uL (ref 1500–7800)
Neutrophils Relative %: 62.8 %
Platelets: 335 10*3/uL (ref 140–400)
RBC: 5.11 10*6/uL (ref 4.20–5.80)
RDW: 17.5 % — ABNORMAL HIGH (ref 11.0–15.0)
Total Lymphocyte: 24.2 %
WBC: 10.1 10*3/uL (ref 3.8–10.8)

## 2021-03-03 LAB — LIPID PANEL
Cholesterol: 188 mg/dL (ref <200)
HDL: 46 mg/dL (ref >=40)
LDL Cholesterol (Calc): 112 mg/dL (calc) — ABNORMAL HIGH
Non-HDL Cholesterol (Calc): 142 mg/dL (calc) — ABNORMAL HIGH (ref <130)
Total CHOL/HDL Ratio: 4.1 (calc) (ref <5.0)
Triglycerides: 189 mg/dL — ABNORMAL HIGH (ref <150)

## 2021-03-03 LAB — MICROALBUMIN / CREATININE URINE RATIO
Creatinine, Urine: 153 mg/dL (ref 20–320)
Microalb Creat Ratio: 104 mcg/mg creat — ABNORMAL HIGH (ref <30)
Microalb, Ur: 15.9 mg/dL

## 2021-03-03 LAB — PSA: PSA: 7.63 ng/mL — ABNORMAL HIGH (ref <=4.00)

## 2021-03-03 LAB — VITAMIN D 25 HYDROXY (VIT D DEFICIENCY, FRACTURES): Vit D, 25-Hydroxy: 83 ng/mL (ref 30–100)

## 2021-03-03 LAB — INSULIN, RANDOM: Insulin: 5.7 u[IU]/mL

## 2021-03-03 LAB — MAGNESIUM: Magnesium: 2 mg/dL (ref 1.5–2.5)

## 2021-03-04 ENCOUNTER — Other Ambulatory Visit: Payer: Self-pay | Admitting: Internal Medicine

## 2021-03-04 MED ORDER — SULFAMETHOXAZOLE-TRIMETHOPRIM 800-160 MG PO TABS: ORAL_TABLET | ORAL | 0 refills | Status: DC

## 2021-03-04 NOTE — Progress Notes (Signed)
============================================================ -   Test results slightly outside the reference range are not unusual. If there is anything important, I will review this with you,  otherwise it is considered normal test values.  If you have further questions,  please do not hesitate to contact me at the office or via My Chart.  ============================================================ ============================================================  -  U/a shows prostate Infection - New Rx for "Sulfa Drug"  for 3 weeks sent to [facility]   -  PSA is also elevated which is consistent with a prostate infection ============================================================ ============================================================  -  Total Chol = 188 -  Excellent   - Very low risk for Heart Attack  / Stroke ========================================================  - But  . . . . . . Bad /Dangerous LDL Chol = 112 - slightly elevated, . . . . Marland Kitchen                                                           So - Recommend low cholesterol diet   - Cholesterol only comes from animal sources  - ie. meat, dairy, egg yolks  - Eat all the vegetables you want.  - Avoid meat, especially red meat - Beef   AND   Pork .  - Avoid cheese & dairy - milk & ice cream.     - Cheese is the most concentrated form of trans-fats which  is the worst thing to clog up our arteries.   - Veggie cheese is OK which can be found in the fresh  produce section at [employer] or [employer] or [employer] ============================================================ ============================================================  -  A1c - Normal - Great - No Diabetes  ! ============================================================ ============================================================  -  Vitamin d = 83   -  Excellent    ! ============================================================ ============================================================  -  All Else - CBC - Kidneys - Electrolytes - Liver - Magnesium & Thyroid    - all  Normal / OK ============================================================ ============================================================  -  Keep up the [place] Work  !  ============================================================ ============================================================

## 2021-03-05 ENCOUNTER — Other Ambulatory Visit: Payer: Self-pay | Admitting: Internal Medicine

## 2021-03-05 ENCOUNTER — Encounter: Payer: Self-pay | Admitting: Internal Medicine

## 2021-03-05 DIAGNOSIS — F325 Major depressive disorder, single episode, in full remission: Secondary | ICD-10-CM

## 2021-03-05 MED ORDER — CITALOPRAM HYDROBROMIDE 20 MG PO TABS: ORAL_TABLET | ORAL | 3 refills | Status: DC

## 2021-03-19 ENCOUNTER — Other Ambulatory Visit: Payer: Self-pay | Admitting: Family

## 2021-03-19 DIAGNOSIS — I10 Essential (primary) hypertension: Secondary | ICD-10-CM

## 2021-03-19 DIAGNOSIS — I480 Paroxysmal atrial fibrillation: Secondary | ICD-10-CM

## 2021-03-20 ENCOUNTER — Telehealth: Payer: Self-pay | Admitting: Internal Medicine

## 2021-03-20 NOTE — Progress Notes (Signed)
  Chronic Care Management   Outreach Note  03/20/2021 Name: [name] Yu MRN: [id number] DOB: September 24, 1927  Referred by: [name], MD Reason for referral : No chief complaint on file.   An unsuccessful telephone outreach was attempted today. The patient was referred to the pharmacist for assistance with care management and care coordination.   Follow Up Plan:   Tatjana Dellinger Upstream Scheduler

## 2021-03-21 ENCOUNTER — Other Ambulatory Visit: Payer: Self-pay

## 2021-03-21 DIAGNOSIS — Z1211 Encounter for screening for malignant neoplasm of colon: Secondary | ICD-10-CM

## 2021-03-21 LAB — POC HEMOCCULT BLD/STL (HOME/3-CARD/SCREEN)
Card #2 Fecal Occult Blod, POC: NEGATIVE
Card #3 Fecal Occult Blood, POC: NEGATIVE
Fecal Occult Blood, POC: NEGATIVE

## 2021-03-27 DIAGNOSIS — Z1211 Encounter for screening for malignant neoplasm of colon: Secondary | ICD-10-CM | POA: Diagnosis not present

## 2021-03-27 DIAGNOSIS — Z1212 Encounter for screening for malignant neoplasm of rectum: Secondary | ICD-10-CM | POA: Diagnosis not present

## 2021-04-02 DIAGNOSIS — H6982 Other specified disorders of Eustachian tube, left ear: Secondary | ICD-10-CM | POA: Diagnosis not present

## 2021-04-11 ENCOUNTER — Other Ambulatory Visit: Payer: Self-pay | Admitting: Internal Medicine

## 2021-04-11 ENCOUNTER — Telehealth: Payer: Self-pay | Admitting: Internal Medicine

## 2021-04-11 DIAGNOSIS — F325 Major depressive disorder, single episode, in full remission: Secondary | ICD-10-CM

## 2021-04-11 MED ORDER — CITALOPRAM HYDROBROMIDE 20 MG PO TABS: ORAL_TABLET | ORAL | 3 refills | Status: AC

## 2021-04-11 NOTE — Chronic Care Management (AMB) (Signed)
  Chronic Care Management   Outreach Note  04/11/2021 Name: [name] Yu MRN: [id number] DOB: 05-14-1927  Referred by: [name], MD Reason for referral : No chief complaint on file.   A second unsuccessful telephone outreach was attempted today. The patient was referred to pharmacist for assistance with care management and care coordination.  Follow Up Plan:   Tatjana Dellinger Upstream Scheduler

## 2021-04-18 ENCOUNTER — Telehealth: Payer: Self-pay | Admitting: Internal Medicine

## 2021-04-18 ENCOUNTER — Ambulatory Visit: Payer: Medicare Other | Admitting: Cardiovascular Disease

## 2021-04-18 NOTE — Chronic Care Management (AMB) (Signed)
  Chronic Care Management   Note  04/18/2021 Name: GWENDOLYN NISHI MRN: [id number] DOB: [birth year]  BELEN ZWAHLEN is a 87 y.o. year old male who is a primary care patient of [name], MD. I reached out to [name] by phone today in response to a referral sent by Mr. Edmund Hilda PCP, [name], MD.   Mr. Buerkle was given information about Chronic Care Management services today including:  CCM service includes personalized support from designated clinical staff supervised by his physician, including individualized plan of care and coordination with other care providers 24/7 contact phone numbers for assistance for urgent and routine care needs. Service will only be billed when office clinical staff spend 20 minutes or more in a month to coordinate care. Only one practitioner may furnish and bill the service in a calendar month. The patient may stop CCM services at any time (effective at the end of the month) by phone call to the office staff.   Molli Knock verbally agreed to assistance and services provided by embedded care coordination/care management team today.  Follow up plan:   Tatjana Secretary/administrator

## 2021-05-11 ENCOUNTER — Encounter: Payer: Self-pay | Admitting: Adult Health

## 2021-05-11 ENCOUNTER — Other Ambulatory Visit: Payer: Self-pay

## 2021-05-11 ENCOUNTER — Ambulatory Visit (INDEPENDENT_AMBULATORY_CARE_PROVIDER_SITE_OTHER): Payer: Medicare Other | Admitting: Adult Health

## 2021-05-11 VITALS — BP 122/72 | HR 78 | Temp 97.5°F | Ht 70.0 in | Wt 200.0 lb

## 2021-05-11 DIAGNOSIS — M545 Low back pain, unspecified: Secondary | ICD-10-CM

## 2021-05-11 DIAGNOSIS — R7309 Other abnormal glucose: Secondary | ICD-10-CM

## 2021-05-11 DIAGNOSIS — R5383 Other fatigue: Secondary | ICD-10-CM

## 2021-05-11 DIAGNOSIS — Z0001 Encounter for general adult medical examination with abnormal findings: Secondary | ICD-10-CM

## 2021-05-11 DIAGNOSIS — F325 Major depressive disorder, single episode, in full remission: Secondary | ICD-10-CM

## 2021-05-11 DIAGNOSIS — D692 Other nonthrombocytopenic purpura: Secondary | ICD-10-CM | POA: Diagnosis not present

## 2021-05-11 DIAGNOSIS — R6889 Other general symptoms and signs: Secondary | ICD-10-CM

## 2021-05-11 DIAGNOSIS — I480 Paroxysmal atrial fibrillation: Secondary | ICD-10-CM

## 2021-05-11 DIAGNOSIS — K5790 Diverticulosis of intestine, part unspecified, without perforation or abscess without bleeding: Secondary | ICD-10-CM

## 2021-05-11 DIAGNOSIS — Z79899 Other long term (current) drug therapy: Secondary | ICD-10-CM | POA: Diagnosis not present

## 2021-05-11 DIAGNOSIS — Z86010 Personal history of colonic polyps: Secondary | ICD-10-CM

## 2021-05-11 DIAGNOSIS — E559 Vitamin D deficiency, unspecified: Secondary | ICD-10-CM

## 2021-05-11 DIAGNOSIS — J849 Interstitial pulmonary disease, unspecified: Secondary | ICD-10-CM

## 2021-05-11 DIAGNOSIS — Z87891 Personal history of nicotine dependence: Secondary | ICD-10-CM

## 2021-05-11 DIAGNOSIS — R972 Elevated prostate specific antigen [PSA]: Secondary | ICD-10-CM

## 2021-05-11 DIAGNOSIS — G8929 Other chronic pain: Secondary | ICD-10-CM | POA: Insufficient documentation

## 2021-05-11 DIAGNOSIS — I2583 Coronary atherosclerosis due to lipid rich plaque: Secondary | ICD-10-CM

## 2021-05-11 DIAGNOSIS — M1612 Unilateral primary osteoarthritis, left hip: Secondary | ICD-10-CM

## 2021-05-11 DIAGNOSIS — E782 Mixed hyperlipidemia: Secondary | ICD-10-CM | POA: Diagnosis not present

## 2021-05-11 DIAGNOSIS — N401 Enlarged prostate with lower urinary tract symptoms: Secondary | ICD-10-CM

## 2021-05-11 DIAGNOSIS — Z0000 Encounter for general adult medical examination without abnormal findings: Secondary | ICD-10-CM

## 2021-05-11 DIAGNOSIS — E663 Overweight: Secondary | ICD-10-CM

## 2021-05-11 DIAGNOSIS — I2510 Atherosclerotic heart disease of native coronary artery without angina pectoris: Secondary | ICD-10-CM

## 2021-05-11 DIAGNOSIS — R001 Bradycardia, unspecified: Secondary | ICD-10-CM

## 2021-05-11 NOTE — Patient Instructions (Addendum)
Try voltaren gel or aspercreme 4 times a day   Try both heat and ice - alternate Heat helps increase circulation and healing, helps relax muscles  Ice helps reduce inflammation   Try salon pas   Cymbalta or duloxetine - antidepressant but very good for chronic pain       https://doi.[email or web address]">  Chronic Back Pain When back pain lasts longer than 3 months, it is called chronic back pain. The cause of your back pain may not be known. Some common causes include: Wear and tear (degenerative disease) of the bones, ligaments, or disks in your back. Inflammation and stiffness in your back (arthritis). People who have chronic back pain often go through certain periods in which the pain is more intense (flare-ups). Many people can learn to manage the pain with home care. Follow these instructions at home: Pay attention to any changes in your symptoms. Take these actions to help withyour pain: Managing pain and stiffness     If directed, apply ice to the painful area. Your health care provider may recommend applying ice during the first 24-48 hours after a flare-up begins. To do this: Put ice in a plastic bag. Place a towel between your skin and the bag. Leave the ice on for 20 minutes, 2-3 times per day. If directed, apply heat to the affected area as often as told by your health care provider. Use the heat source that your health care provider recommends, such as a moist heat pack or a heating pad. Place a towel between your skin and the heat source. Leave the heat on for 20-30 minutes. Remove the heat if your skin turns bright red. This is especially important if you are unable to feel pain, heat, or cold. You may have a greater risk of getting burned. Try soaking in a warm tub. Activity  Avoid bending and other activities that make the problem worse. Maintain a proper position when standing or sitting: When standing, keep your upper back and neck straight, with  your shoulders pulled back. Avoid slouching. When sitting, keep your back straight and relax your shoulders. Do not round your shoulders or pull them backward. Do not sit or stand in one place for long periods of time. Take brief periods of rest throughout the day. This will reduce your pain. Resting in a lying or standing position is usually better than sitting to rest. When you are resting for longer periods, mix in some mild activity or stretching between periods of rest. This will help to prevent stiffness and pain. Get regular exercise. Ask your health care provider what activities are safe for you. Do not lift anything that is heavier than 10 lb (4.5 kg), or the limit that you are told, until your health care provider says that it is safe. Always use proper lifting technique, which includes: Bending your knees. Keeping the load close to your body. Avoiding twisting. Sleep on a firm mattress in a comfortable position. Try lying on your side with your knees slightly bent. If you lie on your back, put a pillow under your knees.  Medicines Treatment may include medicines for pain and inflammation taken by mouth or applied to the skin, prescription pain medicine, or muscle relaxants. Take over-the-counter and prescription medicines only as told by your health care provider. Ask your health care provider if the medicine prescribed to you: Requires you to avoid driving or using machinery. Can cause constipation. You may need to take these actions to prevent or treat  constipation: Drink enough fluid to keep your urine pale yellow. Take over-the-counter or prescription medicines. Eat foods that are high in fiber, such as beans, whole grains, and fresh fruits and vegetables. Limit foods that are high in fat and processed sugars, such as fried or sweet foods. General instructions Do not use any products that contain nicotine or tobacco, such as cigarettes, e-cigarettes, and chewing tobacco. If you  need help quitting, ask your health care provider. Keep all follow-up visits as told by your health care provider. This is important. Contact a health care provider if: You have pain that is not relieved with rest or medicine. Your pain gets worse, or you have new pain. You have a high fever. You have rapid weight loss. You have trouble doing your normal activities. Get help right away if: You have weakness or numbness in one or both of your legs or feet. You have trouble controlling your bladder or your bowels. You have severe back pain and have any of the following: Nausea or vomiting. Pain in your abdomen. Shortness of breath or you faint. Summary Chronic back pain is back pain that lasts longer than 3 months. When a flare-up begins, apply ice to the painful area for the first 24-48 hours. Apply a moist heat pad or use a heating pad on the painful area as directed by your health care provider. When you are resting for longer periods, mix in some mild activity or stretching between periods of rest. This will help to prevent stiffness and pain. This information is not intended to replace advice given to you by your health care provider. Make sure you discuss any questions you have with your healthcare provider. Document Revised: 11/11/2019 Document Reviewed: 11/11/2019 Elsevier Patient Education  2022 [employer].

## 2021-05-11 NOTE — Progress Notes (Signed)
MEDICARE ANNUAL WELLNESS VISIT AND FOLLOW UP Assessment:   Annual Medicare Wellness Visit Due annually  Health maintenance reviewed  Essential hypertension Continue medications Monitor blood pressure at home; call if consistently over 130/80 Continue DASH diet.   Reminder to go to the ER if any CP, SOB, nausea, dizziness, severe HA, changes vision/speech, left arm numbness and tingling and jaw pain.  Coronary artery disease due to lipid rich plaque Control blood pressure, cholesterol, glucose, increase exercise.  Followed by Dr. Rockey Situ  Paroxysmal atrial fibrillation ([place]) Rate controlled today; continue ASA Followed by Dr. Rockey Situ  ASHD (arteriosclerotic heart disease) Control blood pressure, cholesterol, glucose, increase exercise.  Followed by Dr. Rockey Situ  OSA (obstructive sleep apnea) Continue with CPAP  Diverticulosis Add soluble fiber   Vitamin D deficiency At goal at recent check; continue to recommend supplementation for goal of 70-100 Defer vitamin D level  Other abnormal glucose Recent A1Cs at goal Discussed diet/exercise, weight management  Defer A1C; check CMP  Personal history of colonic polyps No more colonoscopies due to age  Anxiety Well managed by current regimen; continue medications-off of benzo Stress management techniques discussed, increase water, good sleep hygiene discussed, increase exercise, and increase veggies.   Mixed hyperlipidemia Continue low cholesterol diet and exercise.  Check lipid panel at routine follow ups  Medication management CBC, CMP/GFR  Depression, major, in remission ([place]) Continue medications  Lifestyle discussed: diet/exerise, sleep hygiene, stress management, hydration  Overweight (BMI 26-29) Continue to recommend diet heavy in fruits and veggies and low in animal meats, cheeses, and dairy products, appropriate calorie intake Discuss exercise recommendations routinely Continue to monitor weight at each  visit  Benign prostatic hyperplasia, unspecified whether lower urinary tract symptoms present Continue with medications  Arthritis of left hip/ chronic back pain Followed by ortho; using OTC analgesics has been declining [facility] Discussed topical options - voltaren, aspercreme, lidocaine patches Discussed possible benefit from cymbalta - consider but declines for now Encouraged continue exercise/activity  Gingival enlargement Significantly improved; nearly resolved; is following up with the [facility] for this biopsy was reportedly negative Follow up dental routinely   Elevated PSA - recheck PSA  Fatigue Initially with brady/irregular rate - EKG shows normal rate and NSCPT, No ST changes Will check labs - CBC, CMP, UA/culture Follow up if not improving and plan CXR/covid 19 if any new URI sx   Over 30 minutes of exam, counseling, chart review, and critical decision making was performed  Future Appointments  Date Time Provider [place]  05/19/2021  2:40 PM Minna Merritts, MD [facility]  06/13/2021  3:00 PM Rush Landmark, Helper [facility] None  09/05/2021  2:30 PM [name], NP [facility] None  03/05/2022 11:00 AM [name], MD [facility] None  05/14/2022  2:30 PM [name], NP [facility] None     Plan:   During the course of the visit the patient was educated and counseled about appropriate screening and preventive services including:   Pneumococcal vaccine  Influenza vaccine Prevnar 13 Td vaccine Screening electrocardiogram Colorectal cancer screening Diabetes screening Glaucoma screening Nutrition counseling    Subjective:  [name] Yu is a 87 y.o. male who presents for Medicare Annual Wellness Visit and 2 month follow up on elevated PSA/prostatitis. Here with wife.   Reports atypical fatigue since yesterday afternoon, "just wiped out," denies specific sx. Initially brady that is atypical, EKG shows  normal rate and NSCPT, nonspecific intraventricular conduction delay.   Last OV he reports was  having was having increased urinary incontinence episodes and some difficulty passing urine. Dr. Melford Aase prescribed 3 week course of bactrim for possible prostatitis, added tamsulosin to finasteride. He notes some improvement, only having incontinence every 7-10 days if he waits too long.   BPH sx well controlled by finasteride, saw urology due to slow stream and elevated PSA. Had poor urine outflow with myrbetriq for urinary frequency and stopped several years back. Per patient had ? TURP of prostate in 90s.  Lab Results  Component Value Date   PSA 7.63 (H) 03/02/2021   PSA 4.8 (H) 01/11/2020   PSA 3.2 11/21/2018    Gingivitis, managing with [facility] magic mouthwash as needed via [place].   he has a diagnosis of depression/anxiety and is currently on celexa 20 mg daily, recently mirtazapine 7.5 mg in [facility] evening by [facility]. Reports symptoms are fairly controlled on current regimen.   He does have chronic left hip and lumbar pain; following with Dr. [name], not planning on surgery; injection to back didn't help. Prescribed opioid via [place], reports takes 1/2 tab AM, 1/2 tab PM. Taking tylenol with limited benefit.   BMI is Body mass index is 28.7 kg/m., he has not been working on diet and exercise, pain limiting.  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 Heart cath in 2003 was Negative. Cardiolite in 2007 was also Negative.  Has CAD per imaging, not aggressively pursued due to age 38 with Dr. Rockey Situ   His blood pressure has been controlled at home, today their BP is BP: 122/72 He does not workout. He denies chest pain, shortness of breath, dizziness.   He is not on cholesterol medication due to age. His  cholesterol is not at goal. The cholesterol last visit was:   Lab Results  Component Value Date   CHOL 188 03/02/2021   HDL 46 03/02/2021   LDLCALC 112 (H) 03/02/2021   TRIG 189 (H) 03/02/2021   CHOLHDL 4.1 03/02/2021   He has been working on diet and exercise for glucose management, and denies foot ulcerations, increased appetite, nausea, paresthesia of the feet, polydipsia, polyuria, visual disturbances, vomiting and weight loss. Last A1C in the office was:  Lab Results  Component Value Date   HGBA1C 5.6 03/02/2021   Last GFR Lab Results  Component Value Date   GFRNONAA 66 03/02/2021    Patient is on Vitamin D supplement and atgoal of 60 at recent check:    Lab Results  Component Value Date   VD25OH 83 03/02/2021        Medication Review:    Current Outpatient Medications (Cardiovascular):    diltiazem (CARDIZEM) 30 MG tablet, TAKE 1 TABLET BY MOUTH  TWICE DAILY   Current Outpatient Medications (Analgesics):    aspirin EC 81 MG tablet, Take 81 mg by mouth daily.   meloxicam (MOBIC) 15 MG tablet, Take  1 tablet Daily with largest Meal  for Pain & Inflammation   Current Outpatient Medications (Other):    Calcium Carbonate Antacid (TUMS E-X PO), Take 2 tablets by mouth daily.   Cholecalciferol 5000 UNITS TABS, Take 1 tablet by mouth daily.   citalopram (CELEXA) 20 MG tablet, Take  1 tablet  Daily  for Mood   finasteride (PROSCAR) 5 MG tablet,  TAKE 1 TABLET BY MOUTH  DAILY FOR PROSTATE   ketoconazole (NIZORAL) 2 % shampoo, APPLY SHAMPOO TOPICALLY  TWICE WEEKLY AS DIRECTED   Magnesium 200 MG TABS, Take 1 tablet by mouth daily.   mirtazapine (REMERON) 7.5 MG tablet, Take 7.5 mg by mouth at bedtime.   Multiple Vitamin (MULTIVITAMIN) capsule, Take 1 capsule by mouth daily.   neomycin-polymyxin b-dexamethasone (MAXITROL) 3.5-10000-0.1 OINT, Apply small amount to lower right eye lid 2 x /day - Morning & Night   neomycin-polymyxin b-dexamethasone (MAXITROL) 3.5-10000-0.1 SUSP,  Use 1 to 2 drops to Right eye every 2 to 3 hours 1st day, then day 2 start 4 x /day   tamsulosin (FLOMAX) 0.4 MG CAPS capsule, TAKE 1 CAPSULE BY MOUTH AT  BEDTIME FOR PROSTATE   vitamin C (ASCORBIC ACID) 500 MG tablet, Take 1,000 mg by mouth daily.   doxycycline (VIBRAMYCIN) 100 MG capsule, Take 1 capsule 2 x /day for 10 days, then 1 x day for 3 weeks with Meals for Infection   sulfamethoxazole-trimethoprim (BACTRIM DS) 800-160 MG tablet, Take 1 tablet  2 x /day with Meals  For Prostate  Infection  Allergies: Allergies  Allergen Reactions   Gabapentin     Confusion    Ciprofloxacin     dysphoria   Novocain [Procaine Hcl]    Prednisone     Nervousness    Current Problems (verified) has Essential hypertension; Abnormal glucose; Hyperlipidemia, mixed; Atrial fibrillation ([place]); Personal history of colonic polyps; ASHD (arteriosclerotic heart disease); Depression, major, in remission ([place]); Diverticulosis; Medication management; Vitamin D deficiency; Encounter for Medicare annual wellness exam; Overweight (BMI 25.0-29.9); CAD (coronary artery disease); Benign localized prostatic hyperplasia with lower urinary tract symptoms (LUTS); H/O total hip arthroplasty, right; Gingival enlargement; Arthritis of left hip; Former pipe smoker; Interstitial lung disease ([place]); and Senile purpura ([place]) on their problem list.  Screening Tests Immunization History  Administered Date(s) Administered   DT (Pediatric) 07/06/2014   Influenza Split 07/20/2014, 07/18/2016   Influenza, High Dose Seasonal PF 08/22/2015, 07/15/2017   Influenza-Unspecified 06/15/2018, 08/04/2019, 07/14/2020   PFIZER(Purple Top)SARS-COV-2 Vaccination 11/12/2019, 12/07/2019, 07/29/2020   Pneumococcal Conjugate-13 07/06/2014   Pneumococcal Polysaccharide-23 06/09/2013   Td 12/15/2016   Zoster, Live 10/15/2005    Preventative care: Last colonoscopy: 2003, hx of polyps, done due to age  Prior vaccinations: TD or Tdap:  2018  Influenza: 07/2020  Pneumococcal: 2014 Prevnar13: 2015 Shingles/Zostavax: 2007 discussed shingrix Covid 19: 2/2, 2021, pfizer + booster  Names of Other Physician/Practitioners you currently use: 1. [facility] here for primary care 2. [facility], eye doctor, last visit 2022 3. Dr. Payton Spark, [facility] dentistry, last visit 2020    Patient Care Team: [name], MD as PCP - General (Internal Medicine) Rockey Situ Kathlene November, MD as PCP - Cardiology (Cardiology) Benito Mccreedy, MD as Referring Physician (Family Medicine) Minna Merritts, MD as Consulting Physician (Cardiology) Inda Castle, MD (Inactive) as Consulting Physician (Gastroenterology) Rush Landmark, [facility] as Pharmacist (Pharmacist)  Surgical: He  has a past surgical history that includes Hernia repair; PROSTATE; Total hip arthroplasty (Right, 03/07/2018); and Cataract extraction, bilateral (Bilateral, 2019). Family His family history includes Alzheimer's disease in his mother; Breast cancer in his daughter and sister; Diabetes in his brother; Heart disease in his brother and sister; Kidney cancer in his father; Rectal cancer in his sister; Ulcerative colitis in his sister. Social history  He reports that he quit smoking about 27 years ago. His smoking use included pipe. He  has never used smokeless tobacco. He reports that he does not drink alcohol and does not use drugs.  MEDICARE WELLNESS OBJECTIVES: Physical activity: Current Exercise Habits: The patient does not participate in regular exercise at present, Exercise limited by: orthopedic condition(s) Cardiac risk factors: Cardiac Risk Factors include: advanced age (>61mn, >>53women);dyslipidemia;hypertension;male gender;sedentary lifestyle;smoking/ tobacco exposure Depression/mood screen:   [facility]11[facility]  Decreased Interest 0  Down, Depressed, Hopeless 1  PHQ - 2 Score 1  Altered sleeping -   Tired, decreased energy -  Change in appetite -  Feeling bad or failure about yourself  -  Trouble concentrating -  Moving slowly or fidgety/restless -  Suicidal thoughts -  PHQ-9 Score -  Difficult doing work/chores -    ADLs:  In your present state of health, do you have any difficulty performing the following activities: 05/11/2021 07/18/2020  Hearing? N N  Vision? N N  Difficulty concentrating or making decisions? N Y  Walking or climbing stairs? Y N  Comment left hip limits but can manage slows -  Dressing or bathing? Y N  Comment family assists -  Doing errands, shopping? N N  Comment family typically drives but is able to if needed -  Preparing Food and eating ? N -  Using the Toilet? N -  In the past six months, have you accidently leaked urine? N -  Do you have problems with loss of bowel control? N -  Managing your Medications? N -  Managing your Finances? N -  Housekeeping or managing your Housekeeping? N -  Comment family assists -  Some recent data might be hidden     Cognitive Testing  Alert? Yes  Normal Appearance?Yes  Oriented to person? Yes  Place? Yes   Time? Yes  Recall of three objects?  Yes  Can perform simple calculations? Yes  Displays appropriate judgment?Yes  Can read the correct time from a watch face?Yes  EOL planning: Does Patient Have a Medical Advance Directive?: Yes Type of Advance Directive: Healthcare Power of Attorney, Living will Does patient want to make changes to medical advance directive?: No - Patient declined [place] of [place]?: Yes - validated most recent copy scanned in chart (See row information)   Objective:   Today's Vitals   05/11/21 1424  BP: 122/72  Pulse: (!) 47  Temp: (!) 97.5 F (36.4 C)  SpO2: 97%  Weight: 200 lb (90.7 kg)  Height: '5\' 10"'$  (1.778 m)    Body mass index is 28.7 kg/m.  General appearance: alert, no distress, WD/WN, male  HEENT: normocephalic, sclerae anicteric,  TMs pearly, nares patent, no discharge or erythema, pharynx normal Oral cavity: MMM, no lesions, no white coating, no erythema, very mild gingival enlargement  Neck: supple, no lymphadenopathy, no thyromegaly, no masses Heart: RRR, normal S1, S2, no murmurs  Lungs: CTA bilaterally, no wheezes, rhonchi, or rales Abdomen: +bs, soft, non tender, non distended, no masses, no hepatomegaly, no splenomegaly Musculoskeletal: Limited ROM of lumbar and left hip with pain; no obvious deformity, no effusion. Slow antalgic gait with cane.  Extremities: no edema, no cyanosis, no clubbing, scattered ecchymoses Pulses: 1+ symmetric, upper and lower extremities, normal cap refill Neurological: alert, oriented x 3, CN2-12 intact, strength normal upper extremities and lower extremities, sensation normal throughout, gait slow, antalgic Psychiatric: normal affect, behavior normal, pleasant  Skin: warm, dry, intact; without rash or concerning lesion  Medicare Attestation I have personally reviewed: The patient's  medical and social history Their use of alcohol, tobacco or illicit drugs Their current medications and supplements The patient's functional ability including ADLs,fall risks, home safety risks, cognitive, and hearing and visual impairment Diet and physical activities Evidence for depression or mood disorders  The patient's weight, height, BMI, and visual acuity have been recorded in the chart.  I have made referrals, counseling, and provided education to the patient based on review of the above and I have provided the patient with a written personalized care plan for preventive services.     Izora Ribas, NP   05/11/2021

## 2021-05-12 ENCOUNTER — Encounter: Payer: Self-pay | Admitting: Adult Health

## 2021-05-12 ENCOUNTER — Other Ambulatory Visit: Payer: Self-pay | Admitting: Adult Health

## 2021-05-12 DIAGNOSIS — R972 Elevated prostate specific antigen [PSA]: Secondary | ICD-10-CM | POA: Insufficient documentation

## 2021-05-12 LAB — PSA: PSA: 9.68 ng/mL — ABNORMAL HIGH (ref <=4.00)

## 2021-05-12 LAB — CBC WITH DIFFERENTIAL/PLATELET
Absolute Monocytes: 834 cells/uL (ref 200–950)
Basophils Absolute: 43 cells/uL (ref 0–200)
Basophils Relative: 0.5 %
Eosinophils Absolute: 163 cells/uL (ref 15–500)
Eosinophils Relative: 1.9 %
HCT: 44.8 % (ref 38.5–50.0)
Hemoglobin: 14.7 g/dL (ref 13.2–17.1)
Lymphs Abs: 2468 cells/uL (ref 850–3900)
MCH: 28.7 pg (ref 27.0–33.0)
MCHC: 32.8 g/dL (ref 32.0–36.0)
MCV: 87.5 fL (ref 80.0–100.0)
MPV: 9.6 fL (ref 7.5–12.5)
Monocytes Relative: 9.7 %
Neutro Abs: 5091 cells/uL (ref 1500–7800)
Neutrophils Relative %: 59.2 %
Platelets: 305 10*3/uL (ref 140–400)
RBC: 5.12 10*6/uL (ref 4.20–5.80)
RDW: 14.1 % (ref 11.0–15.0)
Total Lymphocyte: 28.7 %
WBC: 8.6 10*3/uL (ref 3.8–10.8)

## 2021-05-12 LAB — COMPLETE METABOLIC PANEL WITH GFR
AG Ratio: 1.7 (calc) (ref 1.0–2.5)
ALT: 9 U/L (ref 9–46)
AST: 15 U/L (ref 10–35)
Albumin: 4.1 g/dL (ref 3.6–5.1)
Alkaline phosphatase (APISO): 70 U/L (ref 35–144)
BUN: 14 mg/dL (ref 7–25)
CO2: 30 mmol/L (ref 20–32)
Calcium: 9.6 mg/dL (ref 8.6–10.3)
Chloride: 101 mmol/L (ref 98–110)
Creat: 0.82 mg/dL (ref 0.70–1.22)
Globulin: 2.4 g/dL (calc) (ref 1.9–3.7)
Glucose, Bld: 78 mg/dL (ref 65–99)
Potassium: 4.6 mmol/L (ref 3.5–5.3)
Sodium: 139 mmol/L (ref 135–146)
Total Bilirubin: 0.5 mg/dL (ref 0.2–1.2)
Total Protein: 6.5 g/dL (ref 6.1–8.1)
eGFR: 82 mL/min/{1.73_m2} (ref >=60)

## 2021-05-12 LAB — URINALYSIS W MICROSCOPIC + REFLEX CULTURE
Bacteria, UA: NONE SEEN /HPF
Bilirubin Urine: NEGATIVE
Glucose, UA: NEGATIVE
Hgb urine dipstick: NEGATIVE
Hyaline Cast: NONE SEEN /LPF
Ketones, ur: NEGATIVE
Leukocyte Esterase: NEGATIVE
Nitrites, Initial: NEGATIVE
Protein, ur: NEGATIVE
RBC / HPF: NONE SEEN /HPF (ref 0–2)
Specific Gravity, Urine: 1.006 (ref 1.001–1.035)
Squamous Epithelial / HPF: NONE SEEN /HPF (ref <=5)
WBC, UA: NONE SEEN /HPF (ref 0–5)
pH: 6.5 (ref 5.0–8.0)

## 2021-05-12 LAB — NO CULTURE INDICATED

## 2021-05-14 DIAGNOSIS — I10 Essential (primary) hypertension: Secondary | ICD-10-CM | POA: Diagnosis not present

## 2021-05-14 DIAGNOSIS — E782 Mixed hyperlipidemia: Secondary | ICD-10-CM | POA: Diagnosis not present

## 2021-05-18 NOTE — Progress Notes (Deleted)
Cardiology Office Note  Date:  05/18/2021   ID:  CRISS [name] Yu, DOB August 23, 1927, MRN [id number]  PCP:  [name], MD   No chief complaint on file.   HPI:  [name] is a 87 yo gentleman with  Paroxysmal atrial fibrillation, 2007 at [facility], cardioversion CT scan  2008 showing coronary calcifications HTN,  Hyperlipidemia sleep apnea,  prior pipe smoking for 20 years. bradycardia on  beta blockers,  Prior history of falls Previously followed at the [facility].  presents for routine followup of his hypertension and atrial fibrillation.   Last seen in clinic 03/2019 Was not doing any exercise Very sedentary, legs are getting weaker  Denies tachycardia, concerning for arrhythmia Atrial fib causes sx in arms and head, none recently  No recent falls Does not want to use a walker  Takes diltiazem 60 twice daily Denies any orthostasis symptoms  Reports his weight is stable   right total hip replacement surgery with Dr. [name] Recovered well  EKG personally reviewed by myself on todays visit Shows normal sinus rhythm with rate 75 left axis deviation, poor R wave progression to the anterior precordial leads   Other past medical history reviewed (formerly on warfarin but d/c'd in 2014 for rectal sheath hematoma), -CT brain: Study is mildly limited by motion. There is a subcutaneous hematoma in the soft tissues of the left frontotemporal region.  XR L foot: Nondisplaced fracture of the distal phalanx of the great toe.  Diagnosed with post-concussive sx  Notes from the hospital indicatePer EMS, patient had a mechanical fall earlier today. Tripped on a sock. [place] and hit his head on the washer machine. Denied LOC. Was GCS 15 on EMS arrival and while en route, patient became more sluggish and confused. States he had a 5/10 headache and was worried about a head injury. Denies any other injury.  Prior history of smoking for 30 years from age 61 to age  26 Previous CT scan reviewed  with him from 2008 showing coronary calcifications    normal cath in 2003.  Normal stress test in 2005 at [facility]   07/2006 had Afib w/u at [facility].  [place] and on cardizem Dose decreased due to bradycardia Another normal stress test in 2007.     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  PSH:    Past Surgical History:  Procedure Laterality Date   CATARACT EXTRACTION, BILATERAL Bilateral 2019   at the [place] Right 03/07/2018   Procedure: RIGHT TOTAL HIP ARTHROPLASTY DIRECT ANTERIOR;  Surgeon: [name], MD;  Location: [place];  Service: Orthopedics;  Laterality: Right;    Current Outpatient Medications  Medication Sig Dispense Refill   aspirin EC 81 MG tablet Take 81 mg by mouth daily.     Calcium Carbonate Antacid (TUMS E-X PO) Take 2 tablets by mouth daily.     Cholecalciferol 5000 UNITS TABS Take 1 tablet by mouth daily.     citalopram (CELEXA) 20 MG tablet Take  1 tablet  Daily  for Mood 90 tablet 3   diltiazem (CARDIZEM) 30 MG tablet TAKE 1 TABLET BY MOUTH  TWICE DAILY 180 tablet 0   doxycycline (VIBRAMYCIN) 100 MG capsule Take 1 capsule 2 x /day for 10 days, then 1 x day for 3 weeks with Meals for Infection 41 capsule 0   finasteride (PROSCAR) 5 MG tablet TAKE 1 TABLET BY MOUTH  DAILY FOR PROSTATE 90 tablet 3   ketoconazole (NIZORAL) 2 % shampoo APPLY SHAMPOO TOPICALLY  TWICE WEEKLY AS DIRECTED 120 mL 99   Magnesium 200 MG TABS Take 1 tablet by mouth daily.     meloxicam (MOBIC) 15 MG tablet Take  1 tablet Daily with largest Meal  for Pain &  Inflammation 90 tablet 3   mirtazapine (REMERON) 7.5 MG tablet Take 7.5 mg by mouth at bedtime.     Multiple Vitamin (MULTIVITAMIN) capsule Take 1 capsule by mouth daily.     neomycin-polymyxin b-dexamethasone (MAXITROL) 3.5-10000-0.1 OINT Apply small amount to lower right eye lid 2 x /day - Morning & Night 3.5 g 1   neomycin-polymyxin b-dexamethasone (MAXITROL) 3.5-10000-0.1 SUSP Use 1 to 2 drops to Right eye every 2 to 3 hours 1st day, then day 2 start 4 x /day 5 mL 1   sulfamethoxazole-trimethoprim (BACTRIM DS) 800-160 MG tablet Take 1 tablet  2 x /day with Meals  For Prostate  Infection 20 tablet 0   tamsulosin (FLOMAX) 0.4 MG CAPS capsule TAKE 1 CAPSULE BY MOUTH AT  BEDTIME FOR PROSTATE 90 capsule 3   vitamin C (ASCORBIC ACID) 500 MG tablet Take 1,000 mg by mouth daily.     No current facility-administered medications for this visit.    Allergies:   Gabapentin, Ciprofloxacin, Novocain [procaine hcl], and Prednisone   Social History:  The patient  reports that he quit smoking about 27 years ago. His smoking use included pipe. He has never used smokeless tobacco. He reports that he does not drink alcohol and does not use drugs.   Family History:   family history includes Alzheimer's disease in his mother; Breast cancer in his daughter and sister; Diabetes in his brother; Heart disease in his brother and sister; Kidney cancer in his father; Rectal cancer in his sister; Ulcerative colitis in his sister.    Review of Systems: Review of Systems  Constitutional: Negative.   Respiratory: Negative.    Cardiovascular: Negative.   Gastrointestinal: Negative.   Musculoskeletal: Negative.        Gait instability  Neurological: Negative.   Psychiatric/Behavioral: Negative.    All other systems reviewed and are negative.    PHYSICAL EXAM: VS:  There were no vitals taken for this visit. , BMI There is no height or weight on file to calculate BMI.  Presenting with family, in a  wheelchair Constitutional:  oriented to person, place, and time. No distress.  HENT:  Head: Grossly normal Eyes:  no discharge. No scleral icterus.  Neck: No JVD, no carotid bruits  Cardiovascular: Regular rate and rhythm, no murmurs appreciated Pulmonary/Chest: Clear to auscultation bilaterally, no wheezes or rails Abdominal: Soft.  no distension.  no tenderness.  Musculoskeletal: Normal range of motion Neurological:  normal muscle tone. Coordination normal. No atrophy Skin: Skin warm and dry Psychiatric: normal affect, pleasant   Recent Labs: 03/02/2021: Magnesium 2.0; TSH 2.67 05/11/2021: ALT 9; BUN 14; Creat 0.82; Hemoglobin 14.7; Platelets 305; Potassium 4.6; Sodium 139    Lipid Panel Lab Results  Component Value Date   CHOL 188 03/02/2021   HDL 46 03/02/2021   LDLCALC 112 (H) 03/02/2021   TRIG 189 (H) 03/02/2021  Wt Readings from Last 3 Encounters:  05/11/21 200 lb (90.7 kg)  03/02/21 195 lb 9.6 oz (88.7 kg)  11/11/20 197 lb 2 oz (89.4 kg)       ASSESSMENT AND PLAN:  Essential hypertension - Plan: EKG 12-Lead Blood pressure is well controlled on today's visit. No changes made to the medications.  Mixed hyperlipidemia - Plan: EKG 12-Lead  he does not want a statin despite prior CT scan 2008 showing coronary calcification Denies any anginal symptoms  Paroxysmal atrial fibrillation ([place]) - Plan: EKG 12-Lead  denies any tachycardia concerning for arrhythmia Currently on low-dose aspirin Previous fall with trauma to the left temporal region Also previous hematoma left ABD on warfarin On previous discussion family and patient did not want anticoagulation Atrial fibrillation back in 2007, no clear documentation of atrial fibrillation since that time Discussed again today, declining anticoagulation  Coronary artery disease due to lipid rich plaque -  Currently with no symptoms of angina. No further workup at this time. Continue current medication  regimen.  OSA (obstructive sleep apnea) - Plan: EKG 12-Lead Managed by primary care  History of fall, leg weakness No recent falls  Discussed walking program   Disposition:   F/U  12 months   Total encounter time more than 25 minutes  Greater than 50% was spent in counseling and coordination of care with the patient    No orders of the defined types were placed in this encounter.    Signed, Esmond Plants, M.D., Ph.D. 05/18/2021  [facility] [place], [place] [phone]

## 2021-05-19 ENCOUNTER — Ambulatory Visit: Payer: Medicare Other | Admitting: Cardiovascular Disease

## 2021-05-19 DIAGNOSIS — I10 Essential (primary) hypertension: Secondary | ICD-10-CM

## 2021-05-19 DIAGNOSIS — I2510 Atherosclerotic heart disease of native coronary artery without angina pectoris: Secondary | ICD-10-CM

## 2021-05-19 DIAGNOSIS — F17200 Nicotine dependence, unspecified, uncomplicated: Secondary | ICD-10-CM

## 2021-05-19 DIAGNOSIS — E782 Mixed hyperlipidemia: Secondary | ICD-10-CM

## 2021-05-19 DIAGNOSIS — I480 Paroxysmal atrial fibrillation: Secondary | ICD-10-CM

## 2021-06-09 ENCOUNTER — Telehealth: Payer: Self-pay

## 2021-06-09 NOTE — Progress Notes (Signed)
Name: [name] Yu  MRN: [id number] DOB: August 24, 1927   Reason for Encounter: Chart prep for CPP visit on 06/13/2021.   Conditions to be addressed/monitored: Atrial Fibrillation, CAD, HTN, HLD, Depression, Pulmonary Disease, and Vitamin D deficiency,    Recent office visits:   05/11/2021- Dr. Melford Aase- Patient presented for Medicare annual wellness and follow up. BP 122/72, Pulse 78. No medication changes.   03/02/2021-DrMelford Aase- Patient presented for Screening /Preventative Visit & comprehensive evaluation and management of multiple medical co-morbidities.  Patient has been followed for HTN, HLD, T2_NIDDM  Prediabetes and Vitamin D Deficiency. BP 120/78, Pulse 83. Start Doxycycline '100mg'$  x 10 days, Start Meloxicam '15mg'$ , Start Neomycin-Polymyxin-Dexameth 3.5-10000. Stopped Lorazepam '2mg'$ .  Recent consult visits:  04/02/2021-Dr. Morris([facility])- Patient presented for Eustachian tube dysfunction. BP 120/65, Pulse 58. Start Prednisone '20mg'$  daily for 7 days.   Hospital visits:  None in previous 6 months  Medications: Outpatient Encounter Medications as of 06/09/2021  Medication Sig   aspirin EC 81 MG tablet Take 81 mg by mouth daily.   Calcium Carbonate Antacid (TUMS E-X PO) Take 2 tablets by mouth daily.   Cholecalciferol 5000 UNITS TABS Take 1 tablet by mouth daily.   citalopram (CELEXA) 20 MG tablet Take  1 tablet  Daily  for Mood   diltiazem (CARDIZEM) 30 MG tablet TAKE 1 TABLET BY MOUTH  TWICE DAILY   doxycycline (VIBRAMYCIN) 100 MG capsule Take 1 capsule 2 x /day for 10 days, then 1 x day for 3 weeks with Meals for Infection   finasteride (PROSCAR) 5 MG tablet TAKE 1 TABLET BY MOUTH  DAILY FOR PROSTATE   ketoconazole (NIZORAL) 2 % shampoo APPLY SHAMPOO TOPICALLY  TWICE WEEKLY AS DIRECTED   Magnesium 200 MG TABS Take 1 tablet by mouth daily.   meloxicam (MOBIC) 15 MG tablet Take  1 tablet Daily with largest Meal  for Pain & Inflammation   mirtazapine (REMERON) 7.5 MG  tablet Take 7.5 mg by mouth at bedtime.   Multiple Vitamin (MULTIVITAMIN) capsule Take 1 capsule by mouth daily.   neomycin-polymyxin b-dexamethasone (MAXITROL) 3.5-10000-0.1 OINT Apply small amount to lower right eye lid 2 x /day - Morning & Night   neomycin-polymyxin b-dexamethasone (MAXITROL) 3.5-10000-0.1 SUSP Use 1 to 2 drops to Right eye every 2 to 3 hours 1st day, then day 2 start 4 x /day   sulfamethoxazole-trimethoprim (BACTRIM DS) 800-160 MG tablet Take 1 tablet  2 x /day with Meals  For Prostate  Infection   tamsulosin (FLOMAX) 0.4 MG CAPS capsule TAKE 1 CAPSULE BY MOUTH AT  BEDTIME FOR PROSTATE   vitamin C (ASCORBIC ACID) 500 MG tablet Take 1,000 mg by mouth daily.   No facility-administered encounter medications on file as of 06/09/2021.    Care Gaps: Zoster, Influenza, and Covid Vaccines  Star Rating Drugs: None  Have you seen any other providers since your last visit? **no Any changes in your medications or health? no Any side effects from any medications? no Do you have an symptoms or problems not managed by your medications? no Any concerns about your health right now? no Has your provider asked that you check blood pressure, blood sugar, or follow special diet at home? Yes, checks blood pressure Do you get any type of exercise on a regular basis? no Can you think of a goal you would like to reach for your health? None at the moment Do you have any problems getting your medications? no Is there anything that you would like  to discuss during the appointment? No  Please bring medications and supplements to appointment   [name], [place] [phone]

## 2021-06-13 ENCOUNTER — Ambulatory Visit: Payer: Medicare Other | Admitting: Pharmacist

## 2021-06-14 DIAGNOSIS — M545 Low back pain, unspecified: Secondary | ICD-10-CM | POA: Diagnosis not present

## 2021-06-14 DIAGNOSIS — I480 Paroxysmal atrial fibrillation: Secondary | ICD-10-CM

## 2021-06-14 DIAGNOSIS — I10 Essential (primary) hypertension: Secondary | ICD-10-CM

## 2021-06-14 DIAGNOSIS — E782 Mixed hyperlipidemia: Secondary | ICD-10-CM

## 2021-06-28 ENCOUNTER — Other Ambulatory Visit: Payer: Self-pay | Admitting: Cardiovascular Disease

## 2021-06-28 DIAGNOSIS — I480 Paroxysmal atrial fibrillation: Secondary | ICD-10-CM

## 2021-06-28 DIAGNOSIS — I10 Essential (primary) hypertension: Secondary | ICD-10-CM

## 2021-07-07 NOTE — Progress Notes (Signed)
Cardiology Office Note  Date:  07/10/2021   ID:  [name] Yu, DOB [birth year], MRN [id number]  PCP:  [name], MD   Chief Complaint  Patient presents with   12 month follow up     Patient c/o shortness of breath with over exertion. Medications reviewed by the patient verbally.     HPI:  [name] Yu is a 87 yo gentleman with  Paroxysmal atrial fibrillation, 2007 at [facility], cardioversion CT scan  2008 showing coronary calcifications HTN,  Hyperlipidemia sleep apnea,  prior pipe smoking for 20 years. bradycardia on  beta blockers,  Prior history of falls presents for routine followup of his hypertension and atrial fibrillation.   Last seen in clinic 03/2020 Very sedentary, legs are getting weaker Presents today in a wheelchair  Rare palpitations, lasting 3 minutes, could be atrial fib Goes away Atrial fib causes sx in arms and head,  No recent falls, has gait instability  Nose bleeds, off asa for now, followed by ENT  Takes diltiazem 30 twice daily Previously on 60 mg twice daily, lower dose secondary to orthostasis Now with rare orthostasis symptoms  Reports his weight is stable   right total hip replacement surgery with Dr. [name] Recovered well  EKG personally reviewed by myself on todays visit Shows normal sinus rhythm with rate 75 left axis deviation, poor R wave progression to the anterior precordial leads   Other past medical history reviewed (formerly on warfarin but d/c'd in 2014 for rectal sheath hematoma), -CT brain: Study is mildly limited by motion. There is a subcutaneous hematoma in the soft tissues of the left frontotemporal region.  XR L foot: Nondisplaced fracture of the distal phalanx of the great toe.  Diagnosed with post-concussive sx  Notes from the hospital indicatePer EMS, patient had a mechanical fall earlier today. Tripped on a sock. [place] and hit his head on the washer machine. Denied LOC. Was GCS 15 on EMS arrival and while en route,  patient became more sluggish and confused. States he had a 5/10 headache and was worried about a head injury. Denies any other injury.  Prior history of smoking for 30 years from age 50 to age  36 Previous CT scan reviewed with him from 2008 showing coronary calcifications    normal cath in 2003.  [facility] in 2005 at [facility].   07/2006 had Afib w/u at [facility].  [place] and on cardizem Dose decreased due to bradycardia Another normal stress test in 2007.     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  PSH:    Past Surgical History:  Procedure Laterality Date   CATARACT EXTRACTION, BILATERAL Bilateral 2019   at the [place] Right 03/07/2018   Procedure: RIGHT TOTAL HIP ARTHROPLASTY DIRECT ANTERIOR;  Surgeon: [name], MD;  Location: [place];  Service: Orthopedics;  Laterality: Right;    Current Outpatient Medications  Medication Sig Dispense Refill   aspirin EC 81 MG tablet Take 81 mg by mouth daily.     Calcium Carbonate Antacid (TUMS E-X PO) Take 2 tablets by mouth daily.     Cholecalciferol 5000  UNITS TABS Take 1 tablet by mouth daily.     citalopram (CELEXA) 20 MG tablet Take  1 tablet  Daily  for Mood 90 tablet 3   diltiazem (CARDIZEM) 30 MG tablet TAKE 1 TABLET BY MOUTH  TWICE DAILY 180 tablet 0   finasteride (PROSCAR) 5 MG tablet TAKE 1 TABLET BY MOUTH  DAILY FOR PROSTATE 90 tablet 3   Magnesium 200 MG TABS Take 1 tablet by mouth daily.     mirtazapine (REMERON) 7.5 MG tablet Take 7.5 mg by mouth at bedtime.     Multiple Vitamin (MULTIVITAMIN)  capsule Take 1 capsule by mouth daily.     tamsulosin (FLOMAX) 0.4 MG CAPS capsule TAKE 1 CAPSULE BY MOUTH AT  BEDTIME FOR PROSTATE 90 capsule 3   vitamin C (ASCORBIC ACID) 500 MG tablet Take 1,000 mg by mouth daily.     doxycycline (VIBRAMYCIN) 100 MG capsule Take 1 capsule 2 x /day for 10 days, then 1 x day for 3 weeks with Meals for Infection (Patient not taking: Reported on 07/10/2021) 41 capsule 0   ketoconazole (NIZORAL) 2 % shampoo APPLY SHAMPOO TOPICALLY  TWICE WEEKLY AS DIRECTED (Patient not taking: Reported on 07/10/2021) 120 mL 99   meloxicam (MOBIC) 15 MG tablet Take  1 tablet Daily with largest Meal  for Pain & Inflammation (Patient not taking: Reported on 07/10/2021) 90 tablet 3   neomycin-polymyxin b-dexamethasone (MAXITROL) 3.5-10000-0.1 OINT Apply small amount to lower right eye lid 2 x /day - Morning & Night (Patient not taking: Reported on 07/10/2021) 3.5 g 1   neomycin-polymyxin b-dexamethasone (MAXITROL) 3.5-10000-0.1 SUSP Use 1 to 2 drops to Right eye every 2 to 3 hours 1st day, then day 2 start 4 x /day (Patient not taking: Reported on 07/10/2021) 5 mL 1   sulfamethoxazole-trimethoprim (BACTRIM DS) 800-160 MG tablet Take 1 tablet  2 x /day with Meals  For Prostate  Infection (Patient not taking: Reported on 07/10/2021) 20 tablet 0   No current facility-administered medications for this visit.    Allergies:   Gabapentin, Ciprofloxacin, Meloxicam, Novocain [procaine hcl], Procaine, and Prednisone   Social History:  The patient  reports that he quit smoking about 27 years ago. His smoking use included pipe. He has never used smokeless tobacco. He reports that he does not drink alcohol and does not use drugs.   Family History:   family history includes Alzheimer's disease in his mother; Breast cancer in his daughter and sister; Diabetes in his brother; Heart disease in his brother and sister; Kidney cancer in his father; Rectal cancer in his sister; Ulcerative colitis in his sister.     Review of Systems: Review of Systems  Constitutional: Negative.   Respiratory: Negative.    Cardiovascular: Negative.   Gastrointestinal: Negative.   Musculoskeletal: Negative.        Gait instability  Neurological: Negative.   Psychiatric/Behavioral: Negative.    All other systems reviewed and are negative.    PHYSICAL EXAM: VS:  BP 110/60 (BP Location: Left Arm, Patient Position: Sitting, Cuff Size: Normal)   Pulse 63   Ht 5\' 10"  (1.778 m)   Wt 197 lb 4 oz (89.5 kg)   SpO2 96%   BMI 28.30 kg/m  , BMI Body mass index is 28.3 kg/m.  Presenting with family, in a wheelchair Constitutional:  oriented to person, place, and time. No distress.  HENT:  Head: Grossly normal Eyes:  no discharge. No scleral icterus.  Neck: No JVD, no carotid bruits  Cardiovascular: Regular rate and rhythm, no murmurs appreciated Pulmonary/Chest: Clear to auscultation bilaterally, no wheezes or rails Abdominal: Soft.  no distension.  no tenderness.  Musculoskeletal: Normal range of motion Neurological:  normal muscle tone. Coordination normal. No atrophy Skin: Skin warm and dry Psychiatric: normal affect, pleasant   Recent Labs: 03/02/2021: Magnesium 2.0; TSH 2.67 05/11/2021: ALT 9; BUN 14; Creat 0.82; Hemoglobin 14.7; Platelets 305; Potassium 4.6; Sodium 139    Lipid Panel Lab Results  Component Value Date   CHOL 188 03/02/2021   HDL 46 03/02/2021   LDLCALC 112 (H) 03/02/2021   TRIG 189 (H) 03/02/2021      Wt Readings from Last 3 Encounters:  07/10/21 197 lb 4 oz (89.5 kg)  05/11/21 200 lb (90.7 kg)  03/02/21 195 lb 9.6 oz (88.7 kg)       ASSESSMENT AND PLAN:  Essential hypertension - Blood pressure is well controlled on today's visit. No changes made to the medications.  Mixed hyperlipidemia -  he does not want a statin despite prior CT scan 2008 showing coronary calcification Denies anginal symptoms  Paroxysmal atrial fibrillation ([place]) - Plan: EKG 12-Lead No recent  episodes of prolonged tachycardia, having rare short episodes lasting less than 3 minutes On and off aspirin Previous fall with trauma to the left temporal region Also previous hematoma left ABD on warfarin Not on anticoagulation given high risk Atrial fibrillation back in 2007, no clear documentation of atrial fibrillation since that time Risk of anticoagulation higher than foreseen benefit  Coronary artery disease due to lipid rich plaque -  Currently with no symptoms of angina. No further workup at this time. Continue current medication regimen.  OSA (obstructive sleep apnea) -  Managed by primary care  History of fall, leg weakness No recent falls  Legs weak, uses walker and wheelchair   Total encounter time more than 25 minutes  Greater than 50% was spent in counseling and coordination of care with the patient    Orders Placed This Encounter  Procedures   EKG 12-Lead      Signed, Esmond Plants, M.D., Ph.D. 07/10/2021  [place], [place]

## 2021-07-10 ENCOUNTER — Ambulatory Visit: Payer: Medicare Other | Admitting: Cardiovascular Disease

## 2021-07-10 ENCOUNTER — Other Ambulatory Visit: Payer: Self-pay

## 2021-07-10 ENCOUNTER — Other Ambulatory Visit: Payer: Self-pay | Admitting: Adult Health Nurse Practitioner

## 2021-07-10 ENCOUNTER — Encounter: Payer: Self-pay | Admitting: Cardiovascular Disease

## 2021-07-10 VITALS — BP 110/60 | HR 63 | Ht 70.0 in | Wt 197.2 lb

## 2021-07-10 DIAGNOSIS — I10 Essential (primary) hypertension: Secondary | ICD-10-CM

## 2021-07-10 DIAGNOSIS — E782 Mixed hyperlipidemia: Secondary | ICD-10-CM

## 2021-07-10 DIAGNOSIS — I2583 Coronary atherosclerosis due to lipid rich plaque: Secondary | ICD-10-CM | POA: Diagnosis not present

## 2021-07-10 DIAGNOSIS — I2510 Atherosclerotic heart disease of native coronary artery without angina pectoris: Secondary | ICD-10-CM

## 2021-07-10 DIAGNOSIS — N400 Enlarged prostate without lower urinary tract symptoms: Secondary | ICD-10-CM

## 2021-07-10 DIAGNOSIS — I480 Paroxysmal atrial fibrillation: Secondary | ICD-10-CM | POA: Diagnosis not present

## 2021-07-10 DIAGNOSIS — F17200 Nicotine dependence, unspecified, uncomplicated: Secondary | ICD-10-CM

## 2021-07-10 MED ORDER — DILTIAZEM HCL 30 MG PO TABS: 30.0000 mg | ORAL_TABLET | Freq: Two times a day (BID) | ORAL | 3 refills | Status: DC

## 2021-07-10 NOTE — Patient Instructions (Addendum)
Medication Instructions:  No changes  If you need a refill on your cardiac medications before your next appointment, please call your pharmacy.   Lab work: No new labs needed  Testing/Procedures: No new testing needed  Follow-Up: At [facility], you and your health needs are our priority.  As part of our continuing mission to provide you with exceptional heart care, we have created designated Provider Care Teams.  These Care Teams include your primary Cardiologist (physician) and Advanced Practice Providers (APPs -  Physician Assistants and Nurse Practitioners) who all work together to provide you with the care you need, when you need it.  You will need a follow up appointment in 12 months  Providers on your designated Care Team:   Christopher Berge, NP Ryan Dunn, PA-C Jacquelyn Visser, PA-C Cadence Furth, PA-C  COVID-19 Vaccine Information can be found at: [email or web address] For questions related to vaccine distribution or appointments, please email [email or web address] or call [phone].    

## 2021-08-31 ENCOUNTER — Ambulatory Visit: Payer: Medicare Other | Admitting: Pharmacist

## 2021-08-31 ENCOUNTER — Other Ambulatory Visit: Payer: Self-pay

## 2021-08-31 DIAGNOSIS — I480 Paroxysmal atrial fibrillation: Secondary | ICD-10-CM

## 2021-08-31 DIAGNOSIS — M545 Low back pain, unspecified: Secondary | ICD-10-CM

## 2021-08-31 DIAGNOSIS — F325 Major depressive disorder, single episode, in full remission: Secondary | ICD-10-CM

## 2021-08-31 DIAGNOSIS — R972 Elevated prostate specific antigen [PSA]: Secondary | ICD-10-CM

## 2021-08-31 DIAGNOSIS — N401 Enlarged prostate with lower urinary tract symptoms: Secondary | ICD-10-CM

## 2021-08-31 DIAGNOSIS — I10 Essential (primary) hypertension: Secondary | ICD-10-CM

## 2021-08-31 DIAGNOSIS — E782 Mixed hyperlipidemia: Secondary | ICD-10-CM

## 2021-08-31 DIAGNOSIS — Z96641 Presence of right artificial hip joint: Secondary | ICD-10-CM

## 2021-08-31 DIAGNOSIS — M1612 Unilateral primary osteoarthritis, left hip: Secondary | ICD-10-CM

## 2021-08-31 DIAGNOSIS — Z79899 Other long term (current) drug therapy: Secondary | ICD-10-CM

## 2021-08-31 NOTE — Progress Notes (Signed)
3 MONTH  FOLLOW UP Assessment:    Essential hypertension Continue medications Monitor blood pressure at home; call if consistently over 130/80 Continue DASH diet.   Reminder to go to the ER if any CP, SOB, nausea, dizziness, severe HA, changes vision/speech, left arm numbness and tingling and jaw pain.  Coronary artery disease due to lipid rich plaque Control blood pressure, cholesterol, glucose, increase exercise.  Followed by Dr. Rockey Situ  Paroxysmal atrial fibrillation ([place]) Rate controlled today; continue ASA Followed by Dr. Rockey Situ  ASHD (arteriosclerotic heart disease) Control blood pressure, cholesterol, glucose, increase exercise.  Followed by Dr. Rockey Situ  OSA (obstructive sleep apnea) Continue with CPAP  Diverticulosis Add soluble fiber   Vitamin D deficiency At goal at recent check; continue to recommend supplementation for goal of 70-100 Defer vitamin D level  Other abnormal glucose Recent A1Cs at goal Discussed diet/exercise, weight management  Defer A1C; check CMP  Personal history of colonic polyps No more colonoscopies due to age  Anxiety Well managed by current regimen; continue medications-off of benzo Stress management techniques discussed, increase water, good sleep hygiene discussed, increase exercise, and increase veggies.   Mixed hyperlipidemia Continue low cholesterol diet and exercise.  Check lipid panel per patient request  Medication management CBC, CMP/GFR  Depression, major, in remission ([place]) Continue medications  Lifestyle discussed: diet/exerise, sleep hygiene, stress management, hydration  Overweight (BMI 26-29) Continue to recommend diet heavy in fruits and veggies and low in animal meats, cheeses, and dairy products, appropriate calorie intake Discuss exercise recommendations routinely Continue to monitor weight at each visit  Benign prostatic hyperplasia, unspecified whether lower urinary tract symptoms present Continue with  medications  Arthritis of left hip/ chronic [facility] pain [facility], doing well with low dose vicodin Encouraged continue exercise/activity  Gingival enlargement Significantly improved; nearly resolved; is following up with the [facility] for this biopsy was reportedly negative Follow up dental routinely   Elevated PSA [facility] following, had MRI, report requested     Over 30 minutes of exam, counseling, chart review, and critical decision making was performed  Future Appointments  Date Time Provider [place]  01/16/2022  2:15 PM Newton Pigg, [facility] None  03/05/2022 11:00 AM [name], MD [facility] None  05/14/2022  2:30 PM [name], NP [facility] None    Subjective:  [name] Yu is a 87 y.o. male who presents for 3 month follow up on htn, a. Fib, chol, elevated PSA, vit D.   Here with wife.   Earlier this year he was having was having increased urinary incontinence episodes and some difficulty passing urine. Dr. Melford Aase prescribed 3 week course of bactrim for possible prostatitis, added tamsulosin to finasteride. He notes some improvement, only having incontinence every 7-10 days if he waits too long. He has since seen [place], recently ha UA check that was negative. They are following for elevated PSAs with nodule on DRE, reports has had MRI (report reqeusted), has follow up planned in Jan 2023.   BPH sx well controlled by finasteride/tamsulosin Per patient had ? TURP of prostate in 1980.  Lab Results  Component Value Date   PSA 9.68 (H) 05/11/2021   PSA 7.63 (H) 03/02/2021   PSA 4.8 (H) 01/11/2020   Gingivitis, managing with [facility] magic mouthwash as needed via [place].   he has a diagnosis of depression/anxiety and is currently on celexa 20 mg daily, recently mirtazapine 7.5 mg in the evening by [place].  Reports symptoms are fairly controlled on current  regimen.   He does have chronic left hip and lumbar pain; following with Dr. [name], not  planning on surgery; injection to back didn't help. Prescribed norco 5/325 [facility], reports takes 1/2 tab AM, 1/2 tab PM works very well for him.   BMI is Body mass index is 28.81 kg/m., he has not been working on diet and exercise due to pain.  Wt Readings from Last 3 Encounters:  09/05/21 200 lb 12.8 oz (91.1 kg)  07/10/21 197 lb 4 oz (89.5 kg)  05/11/21 200 lb (90.7 kg)   Patient has hx/o pAfib since d/c'd consequent of a large rectus sheath hematoma & is only on LD bASA.  Heart cath in 2003 was Negative. Cardiolite in 2007 was also Negative.  Has CAD per imaging, not aggressively pursued due to age and asymptomatic.  Follows with Dr. Rockey Situ.   His blood pressure has been controlled at home, today their BP is BP: 128/68 He does not workout. He denies chest pain, shortness of breath, dizziness.   He is not on cholesterol medication due to age. His cholesterol is not at goal. The cholesterol last visit was:   Lab Results  Component Value Date   CHOL 188 03/02/2021   HDL 46 03/02/2021   LDLCALC 112 (H) 03/02/2021   TRIG 189 (H) 03/02/2021   CHOLHDL 4.1 03/02/2021   He has been working on diet and exercise for glucose management, and denies foot ulcerations, increased appetite, nausea, paresthesia of the feet, polydipsia, polyuria, visual disturbances, vomiting and weight loss. Last A1C in the office was:  Lab Results  Component Value Date   HGBA1C 5.6 03/02/2021   Last GFR Lab Results  Component Value Date   GFRNONAA 66 03/02/2021    Patient is on Vitamin D supplement and at goal of 60 at recent check:    Lab Results  Component Value Date   VD25OH 83 03/02/2021        Medication Review:    Current Outpatient Medications (Cardiovascular):    diltiazem (CARDIZEM) 30 MG tablet, Take 1 tablet (30 mg total) by mouth 2 (two) times daily.  Current Outpatient Medications (Respiratory):    fluticasone (FLONASE) 50 MCG/ACT nasal spray, Place 1 spray into both nostrils as  directed.   loratadine (CLARITIN) 10 MG tablet, Take 10 mg by mouth daily as needed for allergies.   magic mouthwash (nystatin, lidocaine, diphenhydrAMINE) suspension*, Take 5 mLs by mouth as needed for mouth pain. Includes Dexamethasone as well  Current Outpatient Medications (Analgesics):    acetaminophen (TYLENOL) 325 MG tablet, Take 325 mg by mouth as needed for moderate pain.   aspirin EC 81 MG tablet, Take 81 mg by mouth 3 (three) times a week. On Monday, Wednesday, and Friday   HYDROcodone-acetaminophen (NORCO/VICODIN) 5-325 MG tablet, Take 0.5 tablets by mouth in the morning and at bedtime.   meloxicam (MOBIC) 15 MG tablet, Take  1 tablet Daily with largest Meal  for Pain & Inflammation (Patient not taking: Reported on 07/10/2021)   Current Outpatient Medications (Other):    ammonium lactate (LAC-HYDRIN) 12 % lotion, Apply 1 application topically as needed for dry skin.   bacitracin 500 UNIT/GM ointment, Apply 1 application topically as needed for wound care.   Calcium Carbonate Antacid (TUMS E-X PO), Take 1 tablet by mouth in the morning and at bedtime.   carboxymethylcellulose 1 % ophthalmic solution, Apply 1 drop to eye at bedtime.   Carboxymethylcellulose Sod PF 0.5 % SOLN, Apply 1 drop to  eye in the morning, at noon, in the evening, and at bedtime.   cholecalciferol (VITAMIN D) 25 MCG (1000 UNIT) tablet, Take 1,000 Units by mouth daily.   citalopram (CELEXA) 20 MG tablet, Take  1 tablet  Daily  for Mood   clotrimazole (LOTRIMIN) 1 % cream, Apply 1 application topically 2 (two) times daily. Toe fungus   finasteride (PROSCAR) 5 MG tablet, TAKE 1 TABLET BY MOUTH  DAILY FOR PROSTATE   ketoconazole (NIZORAL) 2 % shampoo, APPLY SHAMPOO TOPICALLY  TWICE WEEKLY AS DIRECTED   magic mouthwash (nystatin, lidocaine, diphenhydrAMINE) suspension*, Take 5 mLs by mouth as needed for mouth pain. Includes Dexamethasone as well   Magnesium 250 MG TABS, Take 1 tablet by mouth at bedtime.   meclizine  (ANTIVERT) 25 MG tablet, Take 25 mg by mouth as needed for dizziness.   mirtazapine (REMERON) 7.5 MG tablet, Take 7.5 mg by mouth at bedtime.   Multiple Vitamins-Minerals (EYE MULTIVITAMIN/LUTEIN PO), Take 1 tablet by mouth in the morning and at bedtime.   polyethylene glycol powder (GLYCOLAX/MIRALAX) 17 GM/SCOOP powder, Take 1 Container by mouth as needed for moderate constipation.   senna-docusate (SENOKOT-S) 8.6-50 MG tablet, Take 2 tablets by mouth at bedtime as needed for mild constipation.   tamsulosin (FLOMAX) 0.4 MG CAPS capsule, TAKE 1 CAPSULE BY MOUTH AT  BEDTIME FOR PROSTATE   vitamin C (ASCORBIC ACID) 500 MG tablet, Take 500 mg by mouth daily. * These medications belong to multiple therapeutic classes and are listed under each applicable group.  Allergies: Allergies  Allergen Reactions   Gabapentin Other (See Comments)    Confusion    Ciprofloxacin     dysphoria   Meloxicam     Other reaction(s): SWEATING   Novocain [Procaine Hcl]    Procaine    Prednisone Anxiety    Nervousness    Current Problems (verified) has Essential hypertension; Abnormal glucose; Hyperlipidemia, mixed; Atrial fibrillation ([place]); Personal history of colonic polyps; ASHD (arteriosclerotic heart disease); Depression, major, in remission ([place]); Diverticulosis; Medication management; Vitamin D deficiency; Encounter for Medicare annual wellness exam; BMI 28.0-28.9,adult; CAD (coronary artery disease); Benign localized prostatic hyperplasia with lower urinary tract symptoms (LUTS); H/O total hip arthroplasty, right; Gingival enlargement; Arthritis of left hip; Former pipe smoker; Interstitial lung disease ([place]); Senile purpura ([place]); Chronic midline low back pain without sciatica; and Elevated PSA on their problem list.   Surgical: He  has a past surgical history that includes Hernia repair; PROSTATE; Total hip arthroplasty (Right, 03/07/2018); Cataract extraction, bilateral (Bilateral, 2019); and  Transurethral resection of prostate (1980). Family His family history includes Alzheimer's disease in his mother; Breast cancer in his daughter and sister; Diabetes in his brother; Heart disease in his brother and sister; Kidney cancer in his father; Rectal cancer in his sister; Ulcerative colitis in his sister. Social history  He reports that he quit smoking about 27 years ago. His smoking use included pipe. He has never used smokeless tobacco. He reports that he does not drink alcohol and does not use drugs.    Review of Systems  Constitutional:  Negative for malaise/fatigue and weight loss.  HENT:  Negative for hearing loss and tinnitus.   Eyes:  Negative for blurred vision and double vision.  Respiratory:  Negative for cough, sputum production, shortness of breath and wheezing.   Cardiovascular:  Negative for chest pain, palpitations, orthopnea, claudication, leg swelling and PND.  Gastrointestinal:  Negative for abdominal pain, blood in stool, constipation, diarrhea, heartburn, melena, nausea and vomiting.  Genitourinary: Negative.   Musculoskeletal:  Positive for back pain (chronic). Negative for falls, joint pain and myalgias.  Skin:  Negative for rash.  Neurological:  Negative for dizziness, tingling, sensory change, weakness and headaches.  Endo/Heme/Allergies:  Negative for polydipsia.  Psychiatric/Behavioral: Negative.  Negative for depression, memory loss, substance abuse and suicidal ideas. The patient is not nervous/anxious and does not have insomnia.   All other systems reviewed and are negative.    Objective:   Today's Vitals   09/05/21 1433  BP: 128/68  Pulse: 94  Resp: 16  Temp: 97.9 F (36.6 C)  SpO2: 96%  Weight: 200 lb 12.8 oz (91.1 kg)  Height: 5\' 10"  (1.778 m)     Body mass index is 28.81 kg/m.  General appearance: alert, no distress, WD/WN, male  HEENT: normocephalic, sclerae anicteric, TMs pearly, nares patent, no discharge or erythema, pharynx  normal Oral cavity: MMM, no lesions, no white coating, no erythema, very mild gingival enlargement  Neck: supple, no lymphadenopathy, no thyromegaly, no masses Heart: RRR, normal S1, S2, no murmurs  Lungs: CTA bilaterally, no wheezes, rhonchi, or rales Abdomen: +bs, soft, non tender, non distended, no masses, no hepatomegaly, no splenomegaly Musculoskeletal: Limited ROM of lumbar and left hip with pain; no obvious deformity, no effusion. Slow antalgic gait with cane.  Extremities: no edema, no cyanosis, no clubbing, scattered ecchymoses Pulses: 1+ symmetric, upper and lower extremities, normal cap refill Neurological: alert, oriented x 3, CN2-12 intact, strength normal upper extremities and lower extremities, sensation normal throughout, gait slow, antalgic Psychiatric: normal affect, behavior normal, pleasant  Small scattered ecchymoses to extremities.    Izora Ribas, NP   09/05/2021

## 2021-08-31 NOTE — Progress Notes (Signed)
Patient Visit with Chart Prep  [name] Yu, [name] Yu [id number] 82 years, Male  DOB: 11-19-1926  M: [phone] Care Team: Valarie Merino   __________________________________________________ Summary: [name] Yu is a 87 year old male who is doing well today except for some back pain and stiffness this morning. He completes the phone visit today with his wife, [name] Yu. He mentioned that today was an "okay" day, that it was a little hard to get going in the morning but after his pain med and muscle rub, he is able to move faster.  Recommendations/Changes made from today's visit: Follow with urology for further diagnostic testing for Prostate due to upward trend of PSA levels Follow with psychiatrist for further management of PTSD symptoms Recommended diet modifications to help with cholesterol levels (limit fried foods, recommended 4 [place] nuts per month, fruits, veggies, etc)  Plan: Will consider pt for BP RPM in the future to ensure that pt BP is controlled at home and not hypotensive.    Patient scheduled for CCM visit with the clinical pharmacist.  Patient is referred for CCM by their PCP and CPP is under general PCP supervision.: At least 2 of these conditions are expected to last 12 months or longer and patient is at significant risk for acute exacerbations and/or functional decline.  Patient has consented to participation in [place] program. Visit Type: Phone Call Date of Upcoming Visit: 08/31/2021 . Patient Chart Prep [facility]) Patient's Chronic Conditions: Atrial Fibrillation, Hypertension (HTN), Other, Hyperlipidemia/Dyslipidemia (HLD), Depression, Chronic Pain, Benign Prostatic Hyperplasia (BPH) List Other Conditions (separated by comma): Vitamin D deficiency, Arthritis of left hip, Interstitial lung disease ([place]), CAD (coronary artery disease, ASHD (arteriosclerotic heart disease) . Doctor and Hospital Visits Were there PCP Visits in last 6 months?: No Were there Specialist  Visits in last 6 months?: Yes Visit #1: [facility] follow [facility] visit. no medication changes. BP: 145/93 HR: 64 Visit #2: 07/10/21 Dr. Rockey Situ (Cardiology): follow up on [facility]. no medication changes. follow up in 12 months. BP: 110/60 HR: 63 Visit #3: [facility]/06/21 [facility]: nose bleed. no medication changes.  Visit #4: [facility]. no medication changes.  Was there a Hospital Visit in last 30 days?: No Were there other Hospital Visits in last 6 months?: No . Medication Information Are there any Medication discrepancies?: No Are there any Medication adherence gaps (beyond 5 days past due)?: No Medication adherence rates for the STAR rating drugs: none List Patient's current Care Gaps: No current Care Gaps identified . Pre-Call Questions [facility]) Are you able to connect with Patient: Yes Visit Type: Phone Call May we confirm what is the best phone # for the pharmacist to call you?: [phone] Confirm visit date/time: 08/31/21 3:30pm Have you seen any other providers since your last visit?: No Any changes in your medicines or health?: No Any side effects from any medications?: No Do you have any symptoms or problems not managed by your medications?: No What are your top 2 health concerns right now?: "nope" What are your top 2 medication concerns right now?: Other Details: none Has your Dr asked that you take BP, BG or special diet at home?: Other Details: no Do you get any type of exercise on a regular basis?: No What are your top 1 or 2 goals for your health?: none What are you doing to try to reach these goals?: Taking medications What, if any, problems do you have getting your medications from the pharmacy?: None Is there anything else that you  would like to discuss during the appointment?: No . Disease Assessments . Subjective Information Current BP: 145/93 Current HR: 64 taken on: 07/16/2021 Previous BP: 110/60 Previous HR: 63 taken on: 07/09/2021 Weight:  197lb BMI: 30 Last GFR: 82 taken on: 05/10/2021 Why did the patient present?: CCM initial visit Marital status?: Married Details: Did not specify Retired? Previous work?: Retired. Disabled veteran for 25 year What does the patient do during the day?: Patient does normal activities of daily living such as shave, shower. He stay in the house mostly, but he goes outside sometimes. He lives with wife [name] Yu). No children live near the area Who does the patient spend their time with and what do they do?: Wife, stays home mostly and is unable to get exercise in Lifestyle habits such as diet and exercise?: Diet: Pt has an unrestricted diet. He eats 2 vegetables a day, eats corn bread or wheat bread sometimes. 2-3 meals per day  Coffee: 1 cup decaf a day   Water: 2 bottles of water a day Alcohol, tobacco, and illicit drug usage?: Alcohol: 0 Tobacco: 0 Illegal drugs: 0 What is the patient's sleep pattern?: Other (with free form text), Trouble falling asleep, Trouble staying asleep Details: Due to PTSD.   How many hours per night does patient typically sleep?: 6.5 -7 hours Patient pleased with health care they are receiving?: Yes Family, occupational, and living circumstances relevant to overall health?: Family history includes Alzheimer's disease in his mother; Breast cancer in his daughter and sister; Diabetes in his brother; Heart disease in his brother and sister; Kidney cancer in his father; Rectal cancer in his sister; Ulcerative colitis in his sister.  Factors that may affect medication adherence?: Disability, Complexity of med regimen, Pill burden Name and location of Current pharmacy: Mail Order Current Rx insurance plan: [employer] Are meds synced by current pharmacy?: No Are meds delivered by current pharmacy?: Yes - by mail order pharmacy Would patient benefit from direct intervention of clinical lead in dispensing process to optimize clinical outcomes?: Yes Are [facility] pharmacy services  available where patient lives?: Yes Is patient disadvantaged to use [facility]?: Yes Does patient experience delays in picking up medications due to transportation concerns (getting to pharmacy)?: No Any additional demeanor/mood notes?: [name] Yu is a 87 year old male who is doing well today except for some back pain and stiffness this morning. He completes the phone visit today with his wife, [name] Yu. He states that he has a bad back, a right hip replacement so it is a little hard to get going in the morning. After taking a hydrocodone pill, and muscle rub, he is able to move faster . Hypertension (HTN) Assess this condition today?: Yes Is patient able to obtain BP reading today?: Yes BP today is: 114/56 (Tuesday) Heart Rate is: 74 Goal: <130/80 mmHG Hypertension Stage: Normal (SBP <120 and DBP < 80) Patient has tried and failed: Checks twice per month (home nurse and physical therapy usually check for him when they come to his house) Is Patient checking BP at home?: No How often does patient miss taking their blood pressure medications?: Never Has patient experienced hypotension, dizziness, falls or bradycardia?: Yes Provide Details: Pt did not endorse dizziness but had a few falls. Pt does use a walker and can around the house BP RPM device: Does patient qualify?: Yes We discussed: Reducing the amount of salt intake to 1500mg /per day., Targeting 150 minutes of aerobic activity per week Assessment:: Controlled Drug: Diltiazem 30mg  BID (primarily  for a. fib) Assessment: Appropriate, Effective, Safe, Accessible Plan to Counsel: Counseled patient on the importance of taking his time when switching positions (laying down to sitting, sitting to standing) HC Follow up: N/A Pharmacist Follow up: Will consider pt for RPM BP in the future Monitor BP, HR, LFTs, swelling. Marland Kitchen Hyperlipidemia/Dyslipidemia (HLD) Last Lipid panel on: 03/01/2021 TC (Goal<200): 188 LDL: 112 HDL (Goal>40): 46 TG  (Goal<150): 189 ASCVD 10-year risk?is:: N/A due to Age > 79 Assess this condition today?: Yes List additional Risk Factors: CAD/ASHD LDL Goal: <70 Has patient tried and failed any HLD Medications?: Yes Medications failed: simvastatin simvastatin: Age We discussed: How to reduce cholesterol through diet/weight management and physical activity., How a diet high in plant sterols (fruits/vegetables/nuts/whole grains/legumes) may reduce your cholesterol., Other (provide details below) Details: Encouraged pt to try [place] nuts (4 per day) Assessment:: Uncontrolled Drug: None, Lifestyle adjustments Assessment: Appropriate, Query Effectiveness Additional Info: TSH WNL HC Follow up: HLD assessment- Review Lipids in December (TC, LDL, HDL, and TG) and provide results to [facility] Pharmacist Follow up: Lipid panel in December . Depression Previous PHQ-9 Score: 1       05/11/2021 Assess this condition today?: Yes Completing the PHQ-9 Questionnaire today?: Yes PHQ-9: Over the last 2 weeks, how often have you been bothered by the following problems?: Done Little interest or pleasure in doing things: 0 (Not at all) Feeling down, depressed, or hopeless: 0 (Not at all) Trouble falling or staying asleep, or sleeping too much: 2 (More than half of the days) Feeling tired or having little energy: 1 (several days) Poor appetite or overeating: 0 (Not at all) Feeling bad about yourself  or that you are a failure or have let yourself or your family down: 0 (Not at all) Trouble concentrating on things, such as reading the newspaper or watching television: 0 (Not at all) Moving or speaking so slowly that other people could have noticed? Or the opposite - being so fidgety or restless that you have been moving around a to more than usual: 0 (Not at all) Thoughts that you would be better off dead or of hurting yourself in some way: 0 (Not at all) Total PHQ-9 Score (please total responses for questions  above):  3 Depression Severity: Minimal / none (Score: 0-4) In your opinion, how do you feel your depression symptoms have been controlled over the past 3 months?: Stable / stayed the same Assessment:: Controlled Drug: Citalopram 20mg  QD Assessment: Appropriate, Effective, Safe, Accessible Additional Info: Pt sees Dr. Marylyn Ishihara (Psychiatrist) for CBT every 4 weeks. HC Follow up: N/A Pharmacist Follow up: Follow up with pt mood at next visit, F/u side effects of medication: nausea/tremor/weakness. [facility] . BPH Current PSA: 9.68 taken on: 05/11/2021 Assess this condition today?: Yes Are you experiencing any side effects from your BPH medication?: None Completing BPH AUA Questionnaire today?: Yes BPH AUA: Over the past month, how often have you been bothered by the following problems?: Done Sensation of not emptying your bladder completely after you finish urinating?: 0 (Not at all) Had to urinate again less than 2 hours after you finished urinating?: 0 (Not at all) Stopped and started again several times when you urinate?: 0 (Not at all) Found it difficult to postpone urination?: 2 (Less than half the time) Had a weak urinary stream?: 0 (Not at all) Had to push or strain to begin urination?: 0 (Not at all) How many times do you typically get up to  urinate from the time you went to bed until the time you got up in the morning?: 1 time Total AUA Symptom Score (please total responses for questions above): 2 AUA Symptom Severity: Mild (Score: 0-7) Assessment:: Uncontrolled Drug: Finasteride 0.4 mg QD Assessment: Appropriate, Query Effectiveness Drug: Tamsulosin 4mg  QD Assessment: Appropriate, Query Effectiveness Additional Info: Pt is following with a urologist to assess indication for elevated PSA HC Follow up: BPH assessment February  Pharmacist Follow up: Continue to take medications as prescribed and follow with urologist for further  studies/diagnostics . Atrial Fibrillation Current INR: N/A taken on: 08/28/2021 Assess this condition today?: Yes CHADS2-VASc: Hypertension (1), Age 64 Years or Older (2), Vascular Disease (1), Age 69 to 54 years (1) CHADS2-VASc Score: 5 HAS-BLED: Age 66 years or older (1), Medication Usage Predisposing to Bleeding (1), Prior Major Bleeding or Predisposition to Bleeding (1) HAS-BLED Score: 3 Type AF: Paroxysmal Type of control: Rate controlled We discussed: Importance of adherence with anticoagulant therapy and regular monitoring Assessment:: Controlled Drug: Diltiazem 9md BID Assessment: Appropriate, Effective, Safe, Accessible HC Follow up: N/A Pharmacist Follow up: Pt will continue to follow with cardiology, follow up on medication efficacy/ side effects.  . Chronic Pain Assess this condition today?: Yes Where is the pain primarily located?: chronic midline Lower back pain and hip pain Prior surgeries / interventions and response: total hip arthroplasty, right in 2019  What time of day is your pain at it's worst?: Morning What makes your pain better?: Pt takes Norco 5/325 in the morning (1/2 a tablet) along with a muscle rub which helps manage the pain Any issues with constipation related to your medications?: Yes What is the patient's current bowel regimen?: Pt is on Senna-Docusate 2 tabs at night if needed and Miralax as needed. Constipation is quite controlled as he is on a low dose of narcotics Assessment:: Controlled Drug: Norco 5/325 1/2 tablet 2 times a day Assessment: Appropriate, Effective, Safe, Accessible Drug: Tylenol 325mg  PRN Assessment: Appropriate, Effective, Safe, Accessible Plan to Counsel: Be mindful of the amount of Tylenol (acetaminophen) you are taking per day. Do not exceed 3,000mg  in 1 day of Tylenol, which is also in your pain medication, Norco HC Follow up: N/A Pharmacist Follow up: Patient to follow with pain management for pain control. Monitor  signs/symptoms of opioid (respiratory depression, drowsiness, constipation)  . Exercise, Diet and Non-Drug Coordination Needs Additional exercise counseling points. We discussed: decreasing sedentary behavior Additional diet counseling points. We discussed: Other Details: Discussed foods low in cholesterol and foods that can decrease cholesterol levels Discussed Non-Drug Care Coordination Needs: Yes Does Patient have Medication financial barriers?: No . Accountable Health Communities Health-Related Social Needs Screening Tool -  SDOH  ([email or web address]) What is your living situation today? (ref #1): I have a steady place to live Think about the place you live. Do you have problems with any of the following? (ref #2): None of the above Within the past 12 months, you worried that your food would run out before you got money to buy more (ref #3): Never true Within the past 12 months, the food you bought just didn't last and you didn't have money to get more (ref #4): Never true In the past 12 months, has lack of reliable transportation kept you from medical appointments, meetings, work or from getting things needed for daily living? (ref #5): No In the past 12 months, has the electric, gas, oil, or water company threatened to shut off services in your home? (ref #6):  No How often does anyone, including family and friends, physically hurt you? (ref #7): Never (1) How often does anyone, including family and friends, insult or talk down to you? (ref #8): Never (1) How often does anyone, including friends and family, threaten you with harm? (ref #9): Never (1) How often does anyone, including family and friends, scream or curse at you? (ref #10): Never (1) Engagement Notes [name] Yu on 08/31/2021 06:32 PM CPP Chart Review: 24 min  CPP Office Visit: 61 min  CPP Office Visit Documentation: 65 min  CPP Coordination of Care: 0 min HC Care  Plan Completion: 10 min CPP Care Plan Review: 15 min   CARE PLAN  Patient Name: [name] Yu DOB:??[birth year]  Last Care Plan Update:?08/31/2021  COMPREHENSIVE CARE PLAN AND GOALS?   HYPERTENSION?   MOST RECENT BLOOD PRESSURE:?128/68  MY GOAL BLOOD PRESSURE:?<130/80  CURRENT MEDICATION AND DOSING:?   Diltiazem 30mg  take 1 tablet 2 times a day (primarily for atrial fibrillation)   THE GOALS WE HAVE CHOSEN ARE:??  Reducing the amount of salt intake to 1500mg /per day Proper Home BP Measurement  Maintain an at goal blood pressure?   PLAN TO WORK ON THESE GOALS:?   Take medications regularly??  Check BP at home?1 time a week Reduce salt intake (< 1500mg / day)?  Diet: DASH diet (Choose fruits, vegetables, and low-fat dairy products. Increase whole grains, fish, poultry, nuts. Reduce red meats and sugars)?  Weight: 1 kg = ~1 mmHg reduction?    CHOLESTEROL?   MOST RECENT LABS:????03/01/2021  TOTAL CHOLESTEROL:?188  TRIGLYCERIDES:?189  HDL:?46  LDL:?112  CURRENT MEDICATION AND DOSING:?  none  THE GOALS WE HAVE CHOSEN ARE:??   Total Cholesterol goal under 200, Triglycerides goal under 150, HDL goal above 40, LDL goal under 70  BARRIERS TO ACHIEVING GOALS:?   Diet  PLAN TO WORK ON THESE GOALS:?   Diet: high in plant sterols (fruits/ vegetables/ nuts/ whole grains/ legumes). Increase fiber intake (10-25g/day). Avoid foods high in cholesterol (red meat, egg yolks, dairy, oils/ butter). Choose low-fat options.?  Encouraged pt to try [place] nuts (4 per month)      ATRIAL FIBRILLATION   PATIENT IS CURRENTLY RHYTHM/RATE CONTROLLED:  Paroxysmal/rate controlled   CURRENT REGIMEN AND DOSING:?  Diltiazem 30mg  1 tablet 2 times a day   BARRIERS TO ACHIEVING GOALS:   controlled   PLAN TO WORK ON THESE GOALS:???   -take medication as prescribed     Benign Prostatic Hyperplasia   CURRENT REGIMEN AND DOSING:?  Finasteride 0.4 mg every day   Tamsulosin 4mg  every day   PLAN TO  WORK ON THESE GOALS:???   - Continue to take medications as prescribed and follow with urologist for further studies/diagnostics     ? Depression?   CURRENT REGIMEN AND DOSING:?  Citalopram 20mg  1 tablet every day   THE GOALS WE HAVE CHOSEN ARE:?   Lower and manage symptoms of depression?   PLAN TO WORK ON THESE GOALS:???   -Continue medication as prescribed?  -Inform practitioner of any signs and symptoms of worsening depression?  Symptoms: persistent feeling of hopelessness, dejection, constant worry, poor concentration, lack of energy, inability to sleep, sometimes suicidal tendencies, agitation, decreased concentration, sleep changes, diminished interest/ pleasure, changes in appetite?    CHRONIC PAIN  CURRENT REGIMEN AND DOSING:   Tylenol 325mg  as needed  Norco 5/325 1/2 tablet 2 times a day  PLAN TO WORK ON THESE GOALS:     -take medication as prescribed  -  Be mindful of the amount of Tylenol (acetaminophen) you are taking per day. Do not exceed 3,000mg  in 1 day of Tylenol, which is also in your pain medication, [place] LIST?   Your current medication list has been updated. To view, log in to your patient portal.?   Call if any changes need to be made.?    ?  MEDICATION REVIEW?  MEDICATION REVIEW CONDUCTED:?? Yes?  DATE:??? 08/31/2021 BEST POSSIBLE MEDICATION HISTORY?  SOURCE:?? Medical Records?   ?  HOW DO I? - WHEN DO I??   GET AHOLD OF MY DOCTOR DURING BUSINESS HOURS WHEN THE OFFICE IS OPEN?  PHONE NUMBER: [phone]?    AFTER HOURS [facility] NURSE WHEN THE OFFICE IS CLOSED?? PHONE: [phone]?   TALK TO MY CARE COORDINATOR??  NAME: [name] Yu  PHONE: [phone]   EMAIL:??   [email or web address]?   CARE COORDINATOR STAFF?  Rosary Lively, [address] [place], [place]?  Contact Phone Number: [phone]?      NOTE SECTION  Thank you for participating in the Chronic Care Management (CCM)  program with Dr. Melford Aase    This program takes a proactive approach to your health and my team will serve as a resource for you throughout the year. Please follow up at [phone] if you have any questions or experience changes to your overall health. Your next CCM appointment will be conducted with [name] Yu, PharmD as follows:    Date 01/16/2021  Time  2:15 pm  Over the Phone       Rachelle Hora. Jeannett Senior, PharmD   Clinical Pharmacist   [email or web address]   [phone]                    [place] Jeannett Senior, PharmD   Clinical Pharmacist   [email or web address]   [phone]

## 2021-09-05 ENCOUNTER — Other Ambulatory Visit: Payer: Self-pay

## 2021-09-05 ENCOUNTER — Ambulatory Visit (INDEPENDENT_AMBULATORY_CARE_PROVIDER_SITE_OTHER): Payer: Medicare Other | Admitting: Adult Health

## 2021-09-05 ENCOUNTER — Ambulatory Visit: Payer: Medicare Other | Admitting: Pharmacist

## 2021-09-05 ENCOUNTER — Encounter: Payer: Self-pay | Admitting: Adult Health

## 2021-09-05 VITALS — BP 128/68 | HR 94 | Temp 97.9°F | Resp 16 | Ht 70.0 in | Wt 200.8 lb

## 2021-09-05 DIAGNOSIS — I2510 Atherosclerotic heart disease of native coronary artery without angina pectoris: Secondary | ICD-10-CM | POA: Diagnosis not present

## 2021-09-05 DIAGNOSIS — E559 Vitamin D deficiency, unspecified: Secondary | ICD-10-CM | POA: Diagnosis not present

## 2021-09-05 DIAGNOSIS — I480 Paroxysmal atrial fibrillation: Secondary | ICD-10-CM

## 2021-09-05 DIAGNOSIS — F325 Major depressive disorder, single episode, in full remission: Secondary | ICD-10-CM

## 2021-09-05 DIAGNOSIS — E782 Mixed hyperlipidemia: Secondary | ICD-10-CM

## 2021-09-05 DIAGNOSIS — I10 Essential (primary) hypertension: Secondary | ICD-10-CM | POA: Diagnosis not present

## 2021-09-05 DIAGNOSIS — Z79899 Other long term (current) drug therapy: Secondary | ICD-10-CM | POA: Diagnosis not present

## 2021-09-05 DIAGNOSIS — I2583 Coronary atherosclerosis due to lipid rich plaque: Secondary | ICD-10-CM

## 2021-09-05 DIAGNOSIS — R972 Elevated prostate specific antigen [PSA]: Secondary | ICD-10-CM

## 2021-09-05 DIAGNOSIS — Z6828 Body mass index (BMI) 28.0-28.9, adult: Secondary | ICD-10-CM

## 2021-09-05 DIAGNOSIS — J849 Interstitial pulmonary disease, unspecified: Secondary | ICD-10-CM

## 2021-09-05 DIAGNOSIS — N401 Enlarged prostate with lower urinary tract symptoms: Secondary | ICD-10-CM

## 2021-09-05 DIAGNOSIS — D692 Other nonthrombocytopenic purpura: Secondary | ICD-10-CM

## 2021-09-05 DIAGNOSIS — R7309 Other abnormal glucose: Secondary | ICD-10-CM | POA: Diagnosis not present

## 2021-09-06 LAB — LIPID PANEL
Cholesterol: 188 mg/dL (ref <200)
HDL: 44 mg/dL (ref >=40)
LDL Cholesterol (Calc): 106 mg/dL (calc) — ABNORMAL HIGH
Non-HDL Cholesterol (Calc): 144 mg/dL (calc) — ABNORMAL HIGH (ref <130)
Total CHOL/HDL Ratio: 4.3 (calc) (ref <5.0)
Triglycerides: 267 mg/dL — ABNORMAL HIGH (ref <150)

## 2021-09-06 LAB — COMPLETE METABOLIC PANEL WITH GFR
AG Ratio: 1.7 (calc) (ref 1.0–2.5)
ALT: 10 U/L (ref 9–46)
AST: 15 U/L (ref 10–35)
Albumin: 3.8 g/dL (ref 3.6–5.1)
Alkaline phosphatase (APISO): 82 U/L (ref 35–144)
BUN: 12 mg/dL (ref 7–25)
CO2: 28 mmol/L (ref 20–32)
Calcium: 9.2 mg/dL (ref 8.6–10.3)
Chloride: 103 mmol/L (ref 98–110)
Creat: 0.91 mg/dL (ref 0.70–1.22)
Globulin: 2.3 g/dL (calc) (ref 1.9–3.7)
Glucose, Bld: 110 mg/dL — ABNORMAL HIGH (ref 65–99)
Potassium: 4.4 mmol/L (ref 3.5–5.3)
Sodium: 139 mmol/L (ref 135–146)
Total Bilirubin: 0.2 mg/dL (ref 0.2–1.2)
Total Protein: 6.1 g/dL (ref 6.1–8.1)
eGFR: 79 mL/min/{1.73_m2} (ref >=60)

## 2021-09-06 LAB — CBC WITH DIFFERENTIAL/PLATELET
Absolute Monocytes: 548 cells/uL (ref 200–950)
Basophils Absolute: 22 cells/uL (ref 0–200)
Basophils Relative: 0.3 %
Eosinophils Absolute: 259 cells/uL (ref 15–500)
Eosinophils Relative: 3.5 %
HCT: 44.4 % (ref 38.5–50.0)
Hemoglobin: 14.7 g/dL (ref 13.2–17.1)
Lymphs Abs: 1672 cells/uL (ref 850–3900)
MCH: 30.2 pg (ref 27.0–33.0)
MCHC: 33.1 g/dL (ref 32.0–36.0)
MCV: 91.4 fL (ref 80.0–100.0)
MPV: 10 fL (ref 7.5–12.5)
Monocytes Relative: 7.4 %
Neutro Abs: 4899 cells/uL (ref 1500–7800)
Neutrophils Relative %: 66.2 %
Platelets: 256 10*3/uL (ref 140–400)
RBC: 4.86 10*6/uL (ref 4.20–5.80)
RDW: 13.3 % (ref 11.0–15.0)
Total Lymphocyte: 22.6 %
WBC: 7.4 10*3/uL (ref 3.8–10.8)

## 2021-09-06 LAB — TSH: TSH: 1.79 mIU/L (ref 0.40–4.50)

## 2021-09-06 LAB — MAGNESIUM: Magnesium: 2.1 mg/dL (ref 1.5–2.5)

## 2021-09-13 DIAGNOSIS — I10 Essential (primary) hypertension: Secondary | ICD-10-CM | POA: Diagnosis not present

## 2021-09-13 DIAGNOSIS — M545 Low back pain, unspecified: Secondary | ICD-10-CM | POA: Diagnosis not present

## 2021-09-13 DIAGNOSIS — I480 Paroxysmal atrial fibrillation: Secondary | ICD-10-CM | POA: Diagnosis not present

## 2021-09-13 DIAGNOSIS — E782 Mixed hyperlipidemia: Secondary | ICD-10-CM | POA: Diagnosis not present

## 2021-11-30 ENCOUNTER — Telehealth: Payer: Self-pay

## 2021-11-30 NOTE — Telephone Encounter (Signed)
Spoke to wife "Collie Siad:, She stated the patient is very concerned about me questioning her because "Dossie has been off his cholesterol medications for 10 years." I looked on EPIC and do not see a cholesterol medication on there. Told the pts. wife that I will call back when I get more clarification on this.   Total time spent: [place], [facility]

## 2022-01-16 ENCOUNTER — Telehealth: Payer: Self-pay

## 2022-01-16 ENCOUNTER — Ambulatory Visit: Payer: Medicare Other | Admitting: Pharmacy Technician

## 2022-01-16 DIAGNOSIS — G8929 Other chronic pain: Secondary | ICD-10-CM

## 2022-01-16 DIAGNOSIS — I480 Paroxysmal atrial fibrillation: Secondary | ICD-10-CM

## 2022-01-16 DIAGNOSIS — Z79899 Other long term (current) drug therapy: Secondary | ICD-10-CM

## 2022-01-16 DIAGNOSIS — E782 Mixed hyperlipidemia: Secondary | ICD-10-CM

## 2022-01-16 DIAGNOSIS — Z6828 Body mass index (BMI) 28.0-28.9, adult: Secondary | ICD-10-CM

## 2022-01-16 NOTE — Telephone Encounter (Signed)
LM-01/16/22-Called patient to move pt. CPP visit to !;00pm today per CPP request. Unable to reach patient. Left detailed message for patient to return call. ? ?Total time spent: 2 min ?

## 2022-01-19 NOTE — Progress Notes (Signed)
Follow Up Pharmacist Visit  ? ?[name],[name]  [id number] ?88 years, Male  DOB: [birth year]  M: [phone] ?Care Team: Bethena Roys  Strategy, Team ?__________________________________________________ ?Patient's Chronic Conditions: Hypertension (HTN), Atrial Fibrillation, Cardiovascular Disease (CVD), Hyperlipidemia/Dyslipidemia (HLD), Chronic Pain, Other, Diabetes (DM) ?List Other Conditions (separated by comma): Vitamin D deficiency, Pre-diabetes ? ?Clinical Summary ?Patient Risk: Self-Care ?Next CCM Follow Up: [facility] transfer ?Next AWV: 05/14/22 ?Next PCP Visit: 03/05/22 ?Summary for PCP:  ?1. No recent falls. ?2. Needs DEXA scan completed or documented. On VitD supplement but no Ca supplement. ?3. CKD diagnosis needs to be captured. ?4. Wife says patient does not have depression and wants that taken off his EMR. ?. ?Doctor and Hospital Visits ?Were there PCP Visits since last visit with the Pharmacist?: Yes ?Visit #1: 09/05/21-Ashley Corbett, NP- Pt. presented for 3 month f/u. No medication changes noted. ?Were there Specialist Visits since last visit with the Pharmacist?: Yes ?Visit #1: 09/29/21-Todd, Annie Main Provider- No information available. ?Visit #2: 10/24/21-Perry, Victory Dakin Provider- No information available. ?Was there a Hospital Visit in last 30 days?: No ?Were there other Hospital Visits since last visit with the Pharmacist?: No ?. ?Medication Information ?Have there been any medication changes from PCP or Specialist since last visit with the Pharmacist?: No ?Are there any Medication adherence gaps (beyond 5 days past due)?: Yes ?Details:  ?Diltiazem '30mg'$ - 04/27/21 & 07/10/21 ? ?Medication adherence rates for the STAR rating drugs:  ?Diltiazem '30mg'$ - 07/10/21 90DS ? ?List Patient's current Care Gaps: AWV Not performed ?Marland Kitchen ?Pre-Call Questions (HC) ?Are you able to connect with Patient: No ?Were you able to leave a message?: Yes ?Relevant details: LM-01/15/22-Called pt. at [phone] to  confirm visit. Unable to reach pt. Left detailed message to return call. ?. ?Subjective Information ?Current BP: 128/68 ?Current HR: 94 ?taken on: 09/05/2021 ?Weight: 200 ?BMI: 30 ?Last GFR: 79 ?taken on: 09/05/2021 ?Visit Completed on: 01/16/2022 ?Why did the patient present?: CCM Follow up ?Alcohol, tobacco, and illicit drug usage?: No ?What is the patient?s sleep pattern?: No sleep issues ?How many hours per night does patient typically sleep?: 7 ?Any additional demeanor/mood notes?: Patient presenting for phone visit with wife Collie Siad. Uses pill box to keep up with medications. Also seen by [place]. ?SDOH: Accountable Health Communities Health-Related Social Needs Screening Tool ?([email or web address]) ?SDOH questions were documented and reviewed (EMR or [employer]) within the past 3 months?: No ?What is your living situation today? (ref #1): I have a steady place to live ?Think about the place you live. Do you have problems with any of the following? (ref #2): None of the above ?Within the past 12 months, you worried that your food would run out before you got money to buy more (ref #3): Never true ?Within the past 12 months, the food you bought just didn't last and you didn't have money to get more (ref #4): Never true ?In the past 12 months, has lack of reliable transportation kept you from medical appointments, meetings, work or from getting things needed for daily living? (ref #5): No ?In the past 12 months, has the electric, gas, oil, or water company threatened to shut off services in your home? (ref #6): No ?How often does anyone, including family and friends, physically hurt you? (ref #7): Never (1) ?How often does anyone, including family and friends, insult or talk down to you? (ref #8): Never (1) ?How often does anyone, including friends and family, threaten you with harm? (ref #9): Never (1) ?How often does  anyone, including family and friends, scream or  curse at you? (ref #10): Never (1) ?. ?Hypertension (HTN) ?Discussed with patient today?: No ?Drug: None ?Assessment: Query Appropriateness ?Marland Kitchen ?Hyperlipidemia/Dyslipidemia (HLD) ?Last Lipid panel on: 10/24/2021 ?TC (Goal<200): 269 ?LDL: 136 ?HDL (Goal>40): 58 ?TG (Goal<150): 162 ?ASCVD 10-year risk?is:: N/A due to Age > 45 ?Discussed with patient today?: Yes ?LDL Goal: <70 ?Has patient tried and failed any HLD Medications?: No ?Check present secondary causes (below) that can lead to increased cholesterol levels (multi-choice optional): Diabetes, CKD ?We discussed: How a diet high in fruits/vegetables/nuts/whole grains/beans may help to reduce your cholesterol. Increasing soluble fiber intake.  Avoiding sugary foods and trans fat, limiting carbohydrates, and reducing portion sizes. Recommended increasing intake of healthy fats into their diet, Complications of hyperlipidemia and prevention methods with patient for 5-10 minutes ?Assessment:: Uncontrolled ?Drug: None ?Assessment: Query Appropriateness ?Additional Info: TG high and LDL high ?Plan to Counsel: Limit fried foods, processed meats and sugars. Increase fruits and vegetables. ?. ?Diabetes (DM) ?Current A1C: 5.6 ?taken on: 03/02/2021 ?Type: Pre-Diabetes/Impaired Fasting Glucose ?Discussed with patient today?: Yes ?Goal A1C: < 6.5 % ?Type: Pre-Diabetes/Impaired Fasting Glucose ?We discussed: Low carbohydrate eating plan with an emphasis on whole grains, legumes, nuts, fruits, and vegetables and minimal refined and processed foods., Proper diabetic foot care including daily foot exams and wearing closed toe shoes., Healthy eating habits for DM with patient for 15-20 minutes, Avoiding both sugar-sweetened and artificially (aspartame) sweetened beverages and increase water intake., Scheduling a yearly eye exam, Scheduling a dental appointment twice yearly, Increasing exercise (walking, biking, swimming) to a goal of 30 minutes per day, as able based on current  activity level and health ?Assessment:: Controlled ?Drug: None ?Assessment: Query Appropriateness ?Marland Kitchen ?Atrial Fibrillation ?Current INR: 2.77 ?taken on: 11/18/2013 ?Previous INR: 2.5 ?taken on: 10/30/2013 ?Discussed with patient today?: Yes ?CHADS2-VASc: Hypertension (1), Age 34 Years or Older (2), Diabetes Mellitus (1) ?CHADS2-VASc Score: 4 ?Type AF: Paroxysmal ?Type of control: Rhythm controlled ?We discussed: Risks of atrial fibrillation (CVA, MI, heart failure) ?Assessment:: Controlled ?Drug: Diltiazem '30mg'$  BID ?Assessment: Query Appropriateness ?Marland Kitchen ?Cardiovascular Disease (CVD) ?Discussed with patient today?: No ?. ?Chronic Pain ?Discussed with patient today?: Yes ?Where is the pain primarily located?: Back ?How would you rate your pain without medications (scale of 1-10)?: 8 ?How would you rate your pain with medications (scale of 1-10)?: 1 ?What time of day is your pain at it's worst?: Morning ?What makes your pain better?: Medications ?Any issues with constipation related to your medications?: No ?We discussed: Benefits of physical therapy ?Assessment:: Controlled ?Drug: Norco 5-'325mg'$  0.5 tab in AM and PM ?Assessment: Appropriate, Effective, Query Safety ?Marland Kitchen ?Tobacco Use Disorder ?Discussed with patient today?: No ?. ?Exercise, Diet and Non-Drug Coordination Needs ?Additional exercise counseling points. We discussed: decreasing sedentary behavior ?Additional diet counseling points. We discussed: key components of the DASH diet, key components of a low-carb eating plan, aiming to consume at least 8 cups of water day ?Discussed Non-Drug Care Coordination Needs: Yes ?Does Patient have Medication financial barriers?: No ?. ?Engagement Notes ?Marda Stalker on 01/16/2022 03:36 PM ?CPP Prep: 97mn ?CPP OV: 115m ?CP Doc: 1086m? ?AveMarda StalkerharmD ?Clinical Pharmacist ?Antara Brecheisen.Kuzey Ogata'@[email or web address]'$ .care ?[phone]  ? ?

## 2022-02-11 DIAGNOSIS — I10 Essential (primary) hypertension: Secondary | ICD-10-CM | POA: Diagnosis not present

## 2022-02-11 DIAGNOSIS — M545 Low back pain, unspecified: Secondary | ICD-10-CM | POA: Diagnosis not present

## 2022-02-11 DIAGNOSIS — E782 Mixed hyperlipidemia: Secondary | ICD-10-CM | POA: Diagnosis not present

## 2022-02-11 DIAGNOSIS — I480 Paroxysmal atrial fibrillation: Secondary | ICD-10-CM | POA: Diagnosis not present

## 2022-03-04 ENCOUNTER — Encounter: Payer: Self-pay | Admitting: Internal Medicine

## 2022-03-04 NOTE — Patient Instructions (Signed)

## 2022-03-04 NOTE — Progress Notes (Signed)
Annual  Screening/Preventative Visit &  Comprehensive Evaluation & Examination  Future Appointments  Date Time Provider Department  03/05/2022 11:00 AM [name], MD [facility]  05/14/2022  2:30 PM [name], NP [facility]  03/08/2023 11:00 AM [name], MD [facility]            This very nice 88 y.o. MWM presents for a Screening /Preventative Visit & comprehensive evaluation and management of multiple medical co-morbidities.  Patient has been followed for HTN, HLD, Prediabetes and Vitamin D Deficiency. P\atient also has hx/o Major Depression in remission on current meds.                                                         Patient & wife relate that recently he was dx'd with Prostate Cat the [facility] and had a neg Abd/Pelvic CT /MRI scan  &  is  being  followed  by  [facility] Surveillance.                                                                                                                                                     Patient has hx/o HTN followed  since 2005. Patient's BP has been controlled on Propranolol taken concomitantly for Migraine prophylaxis.  Patient also has CKD2  (GFR 79) . Patient's BP has been controlled and today's BP is at goal -  120/72.  Patient was dx'd in 2007 with pAfib & started on Coumadin & then stopped when he developed a large spontaneous rectus sheath hematoma.  Heart cath in 2003 was Negative as were 2 negative Cardiolite's in 2005 & 2007. Patient denies any cardiac symptoms as chest pain, palpitations, shortness of breath, dizziness or ankle swelling.       Patient's hyperlipidemia is  near controlled with diet.  Last lipids were  near goal  & with elevated Trig's :  Lab Results  Component Value Date   CHOL 188 09/05/2021   HDL 44 09/05/2021   LDLCALC 106 (H) 09/05/2021   TRIG 267 (H) 09/05/2021   CHOLHDL 4.3 09/05/2021                                                       Patient has hx/o  prediabetes (A1c 5.7%  /2014) and patient denies reactive hypoglycemic symptoms, visual blurring, diabetic polys or paresthesias. Last A1c was normal :   Lab Results  Component Value Date   HGBA1C 5.6 03/02/2021  Finally, patient has history of Vitamin D Deficiency ("33" /2008) and last vitamin D was at goal :   Lab Results  Component Value Date   VD25OH 83 03/02/2021     Current Outpatient Medications on File Prior to Visit  Medication Sig   acetaminophen (TYLENOL) 325 MG tablet Take 325 mgm as needed for moderate pain.   ammonium lactate (LAC-HYDRIN) 12 % lotion Apply 1 application topically as needed for dry skin.   aspirin EC 81 MG tablet Take  3  times a week - Mon,Wed & Fri    bacitracin 500 UNIT/GM ointment Apply 1 application topically as needed for wound care.   Calcium Antacid (TUMS E-X PO) Take 1 tablet bin the morning and at bedtime.   carboxymethylcellulose 1 % ophth soln Apply 1 drop to eye at bedtime.   Carboxymethylcellulose Sod PF 0.5 % SOLN Apply 1 drop to eye in the morning, at noon, in the evening, and at bedtime.   VITAMIN D  1000 u Take 1,000 Units  daily.   citalopram (CELEXA) 20 MG tablet Take  1 tablet  Daily  for Mood   clotrimazole (LOTRIMIN) 1 % cream Apply 2  times daily. Toe fungus   diltiazem (CARDIZEM) 30 MG tablet Take 1 tablet 2  times daily.   finasteride (PROSCAR) 5 MG tablet TAKE 1 TABLET   DAILY FOR PROSTATE   FLONASE  nasal spray Place 1 spray into both nostrils as directed.   NORCO/VICODIN 5-325 MG  Take 0.5 tablets  in the morning and at bedtime.   ketoconazole 2 % shampoo APPLY SHAMPOO TOPICALLY  TWICE WEEKLY    loratadine  10 MG tablet Take  daily as needed for allergies.   magic mouthwash (nystatin, lidocaine, diphenhydrAMINE) suspension Take 5 mLs  as needed for mouth pain. Includes Dexamethasone as well   Magnesium 250 MG TABS Take 1 tablet at bedtime.   meclizine  25 MG tablet Take  as needed for dizziness.   mirtazapine (REMERON) 7.5 MG tablet  Take at bedtime.   EYE MULTIVITAMIN/LUTEIN  Take 1 tablet  in the morning and at bedtime.   polyethylene glycol powder  17 GM Take 1 Container  as needed for moderate constipation.   SENOKOT-S   Take 2 tablets at bedtime as needed    tamsulosin  0.4 MG CAPS capsule TAKE 1 CAPSULE  AT  BEDTIME    vitamin C 500 MG tablet Take daily.     Allergies  Allergen Reactions   Gabapentin Confusion   Ciprofloxacin dysphoria   Meloxicam SWEATING   Novocain [Procaine Hcl]    Procaine    Prednisone Anxiety    Nervousness     Past Medical History:  Diagnosis Date   Arthritis    ASHD (arteriosclerotic heart disease)    Atrial fib/flutter, transient    off coumadin since 11/2013- CHADSVAC of 2   Cancer ([place])    BASAL CELL    Colon polyps    2003 hyperplastic and tubuler adenoma   Depression    Diverticulosis    Dyslipidemia    Dysrhythmia    FHx: heart disease 08/11/2018   History of kidney stones    Hypertension    OSA (obstructive sleep apnea)    NO CPAP IN 10+ YEARS   Pre-diabetes    Rectus sheath hematoma 11/21/2013   Dr. 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     Health Maintenance  Topic Date Due  INFLUENZA VACCINE  05/15/2022   TETANUS/TDAP  09/30/2031   Pneumonia Vaccine 57+ Years old  Completed   COVID-19 Vaccine  Completed   Zoster Vaccines- Shingrix  Completed   HPV VACCINES  Aged Out     Immunization History  Administered Date(s) Administered   DT  07/06/2014   Influenza Split 07/20/2014, 07/18/2016   Influenza, High Dose  08/22/2015, 07/15/2017, 07/13/2021   Influenza 06/15/2018, 08/04/2019, 07/14/2020   PFIZER-SARS-COV-2 Vacc 11/12/2019, 12/07/2019, 07/29/2020   Pfizer Covid-19 Bivalent Booster  07/06/2021   Pneumococcal -13 07/06/2014   Pneumococcal -23 06/09/2013   Td 12/15/2016   Zoster, Live 10/15/2005    Last Colon - 03/11/2002 - Dr Deatra Ina recc f/u 10 year - has since aged-out.  Past Surgical History:  Procedure  Laterality Date   CATARACT EXTRACTION, BILATERAL Bilateral 2019   at the [facility] Right 03/07/2018   Procedure: RIGHT TOTAL HIP ARTHROPLASTY DIRECT ANTERIOR;  Surgeon: [name], MD;  Location: [place];  Service: Orthopedics;  Laterality: Right;   TRANSURETHRAL RESECTION OF PROSTATE  1980     Family History  Problem Relation Age of Onset   Alzheimer's disease Mother    Kidney cancer Father    Diabetes Brother    Breast cancer Sister    Rectal cancer Sister    Breast cancer Daughter    Ulcerative colitis Sister    Heart disease Brother    Heart disease Sister      Social History   Tobacco Use   Smoking status: Former    Types: Pipe    Quit date: 09/14/1993    Years since quitting: 28.4   Smokeless tobacco: Never  Vaping Use   Vaping Use: Never used  Substance Use Topics   Alcohol use: No   Drug use: No      ROS Constitutional: Denies fever, chills, weight loss/gain, headaches, insomnia,  night sweats or change in appetite. Does c/o fatigue. Eyes: Denies redness, blurred vision, diplopia, discharge, itchy or watery eyes.  ENT: Denies discharge, congestion, post nasal drip, epistaxis, sore throat, earache, hearing loss, dental pain, Tinnitus, Vertigo, Sinus pain or snoring.  Cardio: Denies chest pain, palpitations, irregular heartbeat, syncope, dyspnea, diaphoresis, orthopnea, PND, claudication or edema Respiratory: denies cough, dyspnea, DOE, pleurisy, hoarseness, laryngitis or wheezing.  Gastrointestinal: Denies dysphagia, heartburn, reflux, water brash, pain, cramps, nausea, vomiting, bloating, diarrhea, constipation, hematemesis, melena, hematochezia, jaundice or hemorrhoids Genitourinary: Denies dysuria, frequency, urgency, nocturia, hesitancy, discharge, hematuria or flank pain Musculoskeletal: Denies arthralgia, myalgia, stiffness, Jt. Swelling, pain, limp or strain/sprain. Denies Falls. Skin:  Denies puritis, rash, hives, warts, acne, eczema or change in skin lesion Neuro: No weakness, tremor, incoordination, spasms, paresthesia or pain Psychiatric: Denies confusion, memory loss or sensory loss. Denies Depression. Endocrine: Denies change in weight, skin, hair change, nocturia, and paresthesia, diabetic polys, visual blurring or hyper / hypo glycemic episodes.  Heme/Lymph: No excessive bleeding, bruising or enlarged lymph nodes.   Physical Exam  BP 120/72   Pulse 70   Temp 97.7 F (36.5 C)   Resp 16   Ht '5\' 10"'$  (1.778 m)   Wt 202 lb 3.2 oz (91.7 kg)   SpO2 95%   BMI 29.01 kg/m   General Appearance: Well nourished and well groomed and in no apparent distress.  Eyes: PERRLA, EOMs, conjunctiva no swelling or erythema, normal fundi and vessels. Sinuses: No frontal/maxillary tenderness ENT/Mouth: EACs  patent / TMs  nl. Nares clear without erythema, swelling, mucoid exudates. Oral hygiene is good. No erythema, swelling, or exudate. Tongue normal, non-obstructing. Tonsils not swollen or erythematous. Hearing normal.  Neck: Supple, thyroid not palpable. No bruits, nodes or JVD. Respiratory: Respiratory effort normal.  BS equal and clear bilateral without rales, rhonci, wheezing or stridor. Cardio: Heart sounds are normal with regular rate and rhythm and no murmurs, rubs or gallops. Peripheral pulses are normal and equal bilaterally without edema. No aortic or femoral bruits. Chest: symmetric with normal excursions and percussion.  Abdomen: Soft, with Nl bowel sounds. Nontender, no guarding, rebound, hernias, masses, or organomegaly.  Lymphatics: Non tender without lymphadenopathy.  Musculoskeletal: Full ROM all peripheral extremities, joint stability, 5/5 strength, and normal gait. Skin: Warm and dry without rashes, lesions, cyanosis, clubbing or  ecchymosis.  Neuro: Cranial nerves intact, reflexes equal bilaterally. Normal muscle tone, no cerebellar symptoms. Sensation intact.   Pysch: Alert and oriented X 3 with normal affect, insight and judgment appropriate.   Assessment and Plan  1. Annual Preventative/Screening Exam    2. Essential hypertension  - EKG 12-Lead - [place], RETROPERITNL ABD,  LTD - Urinalysis, Routine w reflex microscopic - Microalbumin / creatinine urine ratio - CBC with Differential/Platelet - COMPLETE METABOLIC PANEL WITH GFR - Magnesium - TSH  3. Paroxysmal atrial fibrillation (HCC)  - EKG 12-Lead - [place], RETROPERITNL ABD,  LTD - TSH  4. Hyperlipidemia, mixed  - EKG 12-Lead - [place], RETROPERITNL ABD,  LTD - Lipid panel - TSH  5. ASHD (arteriosclerotic heart disease)  - EKG 12-Lead - [place], RETROPERITNL ABD,  LTD - Lipid panel  6. Prostate cancer     [facility]   7. Abnormal glucose  - EKG 12-Lead - [place], RETROPERITNL ABD,  LTD - Hemoglobin A1c - Insulin, random  8. Vitamin D deficiency  - VITAMIN D 25 Hydroxy   9. Benign localized prostatic hyperplasia with lower urinary tract symptoms (LUTS)  - PSA  10. Depression, major, in remission ([place])  - TSH  11. Screening for colorectal cancer  - POC Hemoccult Bld/Stl   12. Screening for heart disease  - EKG 12-Lead  13. FHx: heart disease  - EKG 12-Lead - [place], RETROPERITNL ABD,  LTD  14. Former smoker  - EKG 12-Lead - [place], RETROPERITNL ABD,  LTD  15. Screening for AAA (aortic abdominal aneurysm)  - [place], RETROPERITNL ABD,  LTD  16. Medication management  - Urinalysis, Routine w reflex microscopic - Microalbumin / creatinine urine ratio - CBC with Differential/Platelet - COMPLETE METABOLIC PANEL WITH GFR - Magnesium - Lipid panel - TSH - Hemoglobin A1c - Insulin, random - VITAMIN D 25 Hydroxy          Patient was counseled in prudent diet, weight control to achieve/maintain BMI less than 25, BP monitoring, regular exercise and medications as discussed.  Discussed med effects and SE's. Routine screening labs and tests as requested with  regular follow-up as recommended. Over 40 minutes of exam, counseling, chart review and high complex critical decision making was performed   Kirtland Bouchard, MD

## 2022-03-05 ENCOUNTER — Ambulatory Visit (INDEPENDENT_AMBULATORY_CARE_PROVIDER_SITE_OTHER): Payer: Medicare Other | Admitting: Internal Medicine

## 2022-03-05 ENCOUNTER — Encounter: Payer: Self-pay | Admitting: Internal Medicine

## 2022-03-05 VITALS — BP 120/72 | HR 70 | Temp 97.7°F | Resp 16 | Ht 70.0 in | Wt 202.2 lb

## 2022-03-05 DIAGNOSIS — E559 Vitamin D deficiency, unspecified: Secondary | ICD-10-CM | POA: Diagnosis not present

## 2022-03-05 DIAGNOSIS — E782 Mixed hyperlipidemia: Secondary | ICD-10-CM

## 2022-03-05 DIAGNOSIS — Z79899 Other long term (current) drug therapy: Secondary | ICD-10-CM

## 2022-03-05 DIAGNOSIS — I700 Atherosclerosis of aorta: Secondary | ICD-10-CM

## 2022-03-05 DIAGNOSIS — I10 Essential (primary) hypertension: Secondary | ICD-10-CM | POA: Diagnosis not present

## 2022-03-05 DIAGNOSIS — Z136 Encounter for screening for cardiovascular disorders: Secondary | ICD-10-CM | POA: Diagnosis not present

## 2022-03-05 DIAGNOSIS — Z0000 Encounter for general adult medical examination without abnormal findings: Secondary | ICD-10-CM | POA: Diagnosis not present

## 2022-03-05 DIAGNOSIS — I2510 Atherosclerotic heart disease of native coronary artery without angina pectoris: Secondary | ICD-10-CM | POA: Diagnosis not present

## 2022-03-05 DIAGNOSIS — Z8249 Family history of ischemic heart disease and other diseases of the circulatory system: Secondary | ICD-10-CM | POA: Diagnosis not present

## 2022-03-05 DIAGNOSIS — Z87891 Personal history of nicotine dependence: Secondary | ICD-10-CM

## 2022-03-05 DIAGNOSIS — I480 Paroxysmal atrial fibrillation: Secondary | ICD-10-CM

## 2022-03-05 DIAGNOSIS — R7309 Other abnormal glucose: Secondary | ICD-10-CM

## 2022-03-05 DIAGNOSIS — N401 Enlarged prostate with lower urinary tract symptoms: Secondary | ICD-10-CM

## 2022-03-05 DIAGNOSIS — C61 Malignant neoplasm of prostate: Secondary | ICD-10-CM | POA: Insufficient documentation

## 2022-03-05 DIAGNOSIS — F325 Major depressive disorder, single episode, in full remission: Secondary | ICD-10-CM

## 2022-03-05 DIAGNOSIS — Z0001 Encounter for general adult medical examination with abnormal findings: Secondary | ICD-10-CM

## 2022-03-05 DIAGNOSIS — Z1211 Encounter for screening for malignant neoplasm of colon: Secondary | ICD-10-CM

## 2022-03-06 ENCOUNTER — Other Ambulatory Visit: Payer: Self-pay | Admitting: Internal Medicine

## 2022-03-06 DIAGNOSIS — N419 Inflammatory disease of prostate, unspecified: Secondary | ICD-10-CM

## 2022-03-06 LAB — COMPLETE METABOLIC PANEL WITH GFR
AG Ratio: 1.7 (calc) (ref 1.0–2.5)
ALT: 8 U/L — ABNORMAL LOW (ref 9–46)
AST: 13 U/L (ref 10–35)
Albumin: 3.9 g/dL (ref 3.6–5.1)
Alkaline phosphatase (APISO): 72 U/L (ref 35–144)
BUN: 12 mg/dL (ref 7–25)
CO2: 27 mmol/L (ref 20–32)
Calcium: 9.4 mg/dL (ref 8.6–10.3)
Chloride: 104 mmol/L (ref 98–110)
Creat: 0.89 mg/dL (ref 0.70–1.22)
Globulin: 2.3 g/dL (calc) (ref 1.9–3.7)
Glucose, Bld: 131 mg/dL — ABNORMAL HIGH (ref 65–99)
Potassium: 4 mmol/L (ref 3.5–5.3)
Sodium: 139 mmol/L (ref 135–146)
Total Bilirubin: 0.4 mg/dL (ref 0.2–1.2)
Total Protein: 6.2 g/dL (ref 6.1–8.1)
eGFR: 80 mL/min/{1.73_m2} (ref >=60)

## 2022-03-06 LAB — LIPID PANEL
Cholesterol: 207 mg/dL — ABNORMAL HIGH (ref <200)
HDL: 53 mg/dL (ref >=40)
LDL Cholesterol (Calc): 129 mg/dL (calc) — ABNORMAL HIGH
Non-HDL Cholesterol (Calc): 154 mg/dL (calc) — ABNORMAL HIGH (ref <130)
Total CHOL/HDL Ratio: 3.9 (calc) (ref <5.0)
Triglycerides: 137 mg/dL (ref <150)

## 2022-03-06 LAB — URINALYSIS, ROUTINE W REFLEX MICROSCOPIC
Bilirubin Urine: NEGATIVE
Glucose, UA: NEGATIVE
Hgb urine dipstick: NEGATIVE
Ketones, ur: NEGATIVE
Nitrite: POSITIVE — AB
Specific Gravity, Urine: 1.015 (ref 1.001–1.035)
Squamous Epithelial / HPF: NONE SEEN /HPF (ref <=5)
WBC, UA: >=60 /HPF — AB (ref 0–5)
pH: 6 (ref 5.0–8.0)

## 2022-03-06 LAB — CBC WITH DIFFERENTIAL/PLATELET
Absolute Monocytes: 507 cells/uL (ref 200–950)
Basophils Absolute: 33 cells/uL (ref 0–200)
Basophils Relative: 0.5 %
Eosinophils Absolute: 182 cells/uL (ref 15–500)
Eosinophils Relative: 2.8 %
HCT: 46.8 % (ref 38.5–50.0)
Hemoglobin: 15.6 g/dL (ref 13.2–17.1)
Lymphs Abs: 1970 cells/uL (ref 850–3900)
MCH: 31.3 pg (ref 27.0–33.0)
MCHC: 33.3 g/dL (ref 32.0–36.0)
MCV: 93.8 fL (ref 80.0–100.0)
MPV: 9.2 fL (ref 7.5–12.5)
Monocytes Relative: 7.8 %
Neutro Abs: 3809 cells/uL (ref 1500–7800)
Neutrophils Relative %: 58.6 %
Platelets: 214 10*3/uL (ref 140–400)
RBC: 4.99 10*6/uL (ref 4.20–5.80)
RDW: 13.2 % (ref 11.0–15.0)
Total Lymphocyte: 30.3 %
WBC: 6.5 10*3/uL (ref 3.8–10.8)

## 2022-03-06 LAB — TSH: TSH: 2.7 mIU/L (ref 0.40–4.50)

## 2022-03-06 LAB — HEMOGLOBIN A1C
Hgb A1c MFr Bld: 5.6 % of total Hgb (ref <5.7)
Mean Plasma Glucose: 114 mg/dL
eAG (mmol/L): 6.3 mmol/L

## 2022-03-06 LAB — MICROSCOPIC MESSAGE

## 2022-03-06 LAB — MAGNESIUM: Magnesium: 2 mg/dL (ref 1.5–2.5)

## 2022-03-06 LAB — INSULIN, RANDOM: Insulin: 66.6 u[IU]/mL — ABNORMAL HIGH

## 2022-03-06 LAB — MICROALBUMIN / CREATININE URINE RATIO
Creatinine, Urine: 136 mg/dL (ref 20–320)
Microalb Creat Ratio: 36 mcg/mg creat — ABNORMAL HIGH (ref <30)
Microalb, Ur: 4.9 mg/dL

## 2022-03-06 LAB — VITAMIN D 25 HYDROXY (VIT D DEFICIENCY, FRACTURES): Vit D, 25-Hydroxy: 59 ng/mL (ref 30–100)

## 2022-03-06 MED ORDER — SULFAMETHOXAZOLE-TRIMETHOPRIM 800-160 MG PO TABS: ORAL_TABLET | ORAL | 0 refills | Status: DC

## 2022-03-06 NOTE — Progress Notes (Signed)
<><><><><><><><><><><><><><><><><><><><><><><><><><><><><><><><><> <><><><><><><><><><><><><><><><><><><><><><><><><><><><><><><><><> -   Test results slightly outside the reference range are not unusual. If there is anything important, I will review this with you,  otherwise it is considered normal test values.  If you have further questions,  please do not hesitate to contact me at the office or via My Chart.  <><><><><><><><><><><><><><><><><><><><><><><><><><><><><><><><><> <><><><><><><><><><><><><><><><><><><><><><><><><><><><><><><><><>  -  Urine shows an infection or Prostatitis                  ( Will ask lab to do a urine culture,                                                but may take 3 to 5 days to get final result back )  - So . . . . Marland Kitchen  - Sent in an Antibiotic for Prostate infection  to you local [facility]  <><><><><><><><><><><><><><><><><><><><><><><><><><><><><><><><><>  -  Total Chol = 107  - elevated                     ( Ideal or Goal is less than 180 ) & - Bad LDL Chol = 129 - very elevated                     (Ideal or Goal is less than 70  )   - So . . . . . Recommend a stricter  low cholesterol diet   - Cholesterol only comes from animal sources  - ie. meat, dairy, egg yolks  - Eat all the vegetables you want.  - Avoid Meat, Avoid Meat,  Avoid Meat - especially Red Meat - Beef AND Pork .  - Avoid cheese & dairy - milk & ice cream.     - Cheese is the most concentrated form of trans-fats which  is the worst thing to clog up our arteries.   - Veggie cheese is OK which can be found in the fresh  produce section at [employer] or [employer] or [employer] <><><><><><><><><><><><><><><><><><><><><><><><><><><><><><><><><>  -  A1c - Normal - No Diabetes  - Great ! <><><><><><><><><><><><><><><><><><><><><><><><><><><><><><><><><>  -  Vitamin D = 50 - OK - Please continue dose same   <><><><><><><><><><><><><><><><><><><><><><><><><><><><><><><><><>  -  All Else - CBC - Kidneys - Electrolytes - Liver - Magnesium & Thyroid    - all  Normal / OK <><><><><><><><><><><><><><><><><><><><><><><><><><><><><><><><><> <><><><><><><><><><><><><><><><><><><><><><><><><><><><><><><><><>

## 2022-03-08 LAB — URINE CULTURE
MICRO NUMBER:: 13440892
SPECIMEN QUALITY:: ADEQUATE

## 2022-03-08 NOTE — Progress Notes (Signed)
<><><><><><><><><><><><><><><><><><><><><><><><><><><><><><><><><> <><><><><><><><><><><><><><><><><><><><><><><><><><><><><><><><><>  -    Urine culture did not grow a definite infection ( as frequently happens ), but   since the Urinalysis appeared so infected, I still recommend                                                                    completing the 3 week course of the antibiotic.   <><><><><><><><><><><><><><><><><><><><><><><><><><><><><><><><><> <><><><><><><><><><><><><><><><><><><><><><><><><><><><><><><><><>

## 2022-03-09 ENCOUNTER — Encounter: Payer: Self-pay | Admitting: Internal Medicine

## 2022-03-10 ENCOUNTER — Other Ambulatory Visit: Payer: Self-pay | Admitting: Internal Medicine

## 2022-03-11 ENCOUNTER — Other Ambulatory Visit: Payer: Self-pay | Admitting: Internal Medicine

## 2022-03-13 ENCOUNTER — Encounter: Payer: Self-pay | Admitting: Internal Medicine

## 2022-03-27 ENCOUNTER — Encounter: Payer: Self-pay | Admitting: Internal Medicine

## 2022-04-19 ENCOUNTER — Other Ambulatory Visit: Payer: Self-pay

## 2022-04-19 DIAGNOSIS — Z1211 Encounter for screening for malignant neoplasm of colon: Secondary | ICD-10-CM

## 2022-04-19 LAB — POC HEMOCCULT BLD/STL (HOME/3-CARD/SCREEN)
Card #2 Fecal Occult Blod, POC: NEGATIVE
Card #3 Fecal Occult Blood, POC: NEGATIVE
Fecal Occult Blood, POC: NEGATIVE

## 2022-04-23 DIAGNOSIS — Z1212 Encounter for screening for malignant neoplasm of rectum: Secondary | ICD-10-CM | POA: Diagnosis not present

## 2022-04-23 DIAGNOSIS — Z1211 Encounter for screening for malignant neoplasm of colon: Secondary | ICD-10-CM | POA: Diagnosis not present

## 2022-05-14 ENCOUNTER — Ambulatory Visit: Payer: Medicare Other | Admitting: Adult Health

## 2022-06-11 ENCOUNTER — Encounter: Payer: Self-pay | Admitting: Nurse Practitioner

## 2022-06-11 ENCOUNTER — Ambulatory Visit (INDEPENDENT_AMBULATORY_CARE_PROVIDER_SITE_OTHER): Payer: Medicare Other | Admitting: Nurse Practitioner

## 2022-06-11 ENCOUNTER — Encounter: Payer: Self-pay | Admitting: Internal Medicine

## 2022-06-11 VITALS — BP 118/74 | HR 65 | Temp 97.9°F | Resp 17 | Ht 70.0 in | Wt 194.2 lb

## 2022-06-11 DIAGNOSIS — G4733 Obstructive sleep apnea (adult) (pediatric): Secondary | ICD-10-CM

## 2022-06-11 DIAGNOSIS — I10 Essential (primary) hypertension: Secondary | ICD-10-CM

## 2022-06-11 DIAGNOSIS — M545 Low back pain, unspecified: Secondary | ICD-10-CM

## 2022-06-11 DIAGNOSIS — E559 Vitamin D deficiency, unspecified: Secondary | ICD-10-CM | POA: Diagnosis not present

## 2022-06-11 DIAGNOSIS — Z860100 Personal history of colon polyps, unspecified: Secondary | ICD-10-CM

## 2022-06-11 DIAGNOSIS — I2510 Atherosclerotic heart disease of native coronary artery without angina pectoris: Secondary | ICD-10-CM

## 2022-06-11 DIAGNOSIS — R972 Elevated prostate specific antigen [PSA]: Secondary | ICD-10-CM

## 2022-06-11 DIAGNOSIS — K061 Gingival enlargement: Secondary | ICD-10-CM

## 2022-06-11 DIAGNOSIS — R5383 Other fatigue: Secondary | ICD-10-CM

## 2022-06-11 DIAGNOSIS — I480 Paroxysmal atrial fibrillation: Secondary | ICD-10-CM | POA: Diagnosis not present

## 2022-06-11 DIAGNOSIS — N401 Enlarged prostate with lower urinary tract symptoms: Secondary | ICD-10-CM

## 2022-06-11 DIAGNOSIS — F325 Major depressive disorder, single episode, in full remission: Secondary | ICD-10-CM

## 2022-06-11 DIAGNOSIS — G8929 Other chronic pain: Secondary | ICD-10-CM

## 2022-06-11 DIAGNOSIS — I2583 Coronary atherosclerosis due to lipid rich plaque: Secondary | ICD-10-CM

## 2022-06-11 DIAGNOSIS — Z0001 Encounter for general adult medical examination with abnormal findings: Secondary | ICD-10-CM

## 2022-06-11 DIAGNOSIS — F419 Anxiety disorder, unspecified: Secondary | ICD-10-CM

## 2022-06-11 DIAGNOSIS — E663 Overweight: Secondary | ICD-10-CM

## 2022-06-11 DIAGNOSIS — K5790 Diverticulosis of intestine, part unspecified, without perforation or abscess without bleeding: Secondary | ICD-10-CM | POA: Diagnosis not present

## 2022-06-11 DIAGNOSIS — R7309 Other abnormal glucose: Secondary | ICD-10-CM | POA: Diagnosis not present

## 2022-06-11 DIAGNOSIS — R6889 Other general symptoms and signs: Secondary | ICD-10-CM | POA: Diagnosis not present

## 2022-06-11 DIAGNOSIS — M1612 Unilateral primary osteoarthritis, left hip: Secondary | ICD-10-CM

## 2022-06-11 DIAGNOSIS — Z79899 Other long term (current) drug therapy: Secondary | ICD-10-CM

## 2022-06-11 DIAGNOSIS — E782 Mixed hyperlipidemia: Secondary | ICD-10-CM | POA: Diagnosis not present

## 2022-06-11 DIAGNOSIS — Z0000 Encounter for general adult medical examination without abnormal findings: Secondary | ICD-10-CM

## 2022-06-11 NOTE — Progress Notes (Signed)
MEDICARE ANNUAL WELLNESS VISIT AND FOLLOW UP Assessment:   Annual Medicare Wellness Visit Due annually  Health maintenance reviewed  Essential hypertension Discussed DASH (Dietary Approaches to Stop Hypertension) DASH diet is lower in sodium than a typical American diet. Cut back on foods that are high in saturated fat, cholesterol, and trans fats. Eat more whole-grain foods, fish, poultry, and nuts Remain active and exercise as tolerated daily.  Monitor BP at home-Call if greater than 130/80.  Check CMP/CBC   Coronary artery disease due to lipid rich plaque Control blood pressure, cholesterol, glucose, increase exercise.  Followed by Dr. Rockey Situ  Paroxysmal atrial fibrillation ([place]) Rate controlled today; continue ASA Followed by Dr. Rockey Situ  ASHD (arteriosclerotic heart disease) Control blood pressure, cholesterol, glucose, increase exercise.  Followed by Dr. Rockey Situ  OSA (obstructive sleep apnea) Resolved 2015 after 40 lb weight loss-no longer needed  Diverticulosis Eat a diet well balanced in fiber. Stay well hydrated. Monitor for any s/s of lower quadrant pain, blood in stool. Notify office for any concerns.   Vitamin D deficiency At goal at recent check; continue to recommend supplementation for goal of 70-100 Check/monitor levels  Other abnormal glucose Education: Reviewed 'ABCs' of diabetes management  Discussed goals to be met and/or maintained include A1C (<7) Blood pressure (<130/80) Cholesterol (LDL <70) Continue Eye Exam yearly  Continue Dental Exam Q6 mo Discussed dietary recommendations Discussed Physical Activity recommendations Foot exam UTD Check/monitor A1C   Personal history of colonic polyps No more colonoscopies due to age  Anxiety Well managed by current regimen; continue medications-off of benzo Stress management techniques discussed, increase water, good sleep hygiene discussed, increase exercise, and increase veggies.   Mixed  hyperlipidemia Discussed lifestyle modifications. Recommended diet heavy in fruits and veggies, omega 3's. Decrease consumption of animal meats, cheeses, and dairy products. Remain active and exercise as tolerated. Continue to monitor. Check lipids/TSH   Medication management All medications discussed and reviewed in full. All questions and concerns regarding medications addressed.     Depression, major, in remission ([place]) Continue medications  Lifestyle discussed: diet/exerise, sleep hygiene, stress management, hydration  Overweight (BMI 26-29) Continue to recommend diet heavy in fruits and veggies and low in animal meats, cheeses, and dairy products, appropriate calorie intake Discuss exercise recommendations routinely Continue to monitor weight at each visit  Benign prostatic hyperplasia, unspecified whether lower urinary tract symptoms present PSA monitored by [facility] - last results 33.6; was instructed to continue surveillance. Continue with medications Monitor symptoms  Arthritis of left hip/ chronic back pain [facility]. Doing well - increase in mobility. [facility] follows for [facility] management Discussed topical options - voltaren, aspercreme, lidocaine patches Discussed possible benefit from cymbalta - consider but declines for now Encouraged continue exercise/activity  Gingival enlargement Resolved. He is following up with the [facility] for this biopsy was reportedly negative Follow up dental routinely   Elevated PSA Discussed increase in symptoms to notify office Check/monitor PSA [facility] followoing  Fatigue Continue to monitor  Most recent lab work WNL - defer to next [place].  Over 30 minutes of exam, counseling, chart review, and critical decision making was performed  Future Appointments  Date Time Provider [place]  07/13/2022  4:20 PM Minna Merritts, MD [facility] None  09/12/2022  2:30 PM [name], MD [facility] None   03/08/2023 11:00 AM [name], MD [facility] None  06/12/2023  2:00 PM Darrol Jump, NP [facility] None     Plan:   During the course of  the visit the patient was educated and counseled about appropriate screening and preventive services including:   Pneumococcal vaccine  Influenza vaccine Prevnar 13 Td vaccine Screening electrocardiogram Colorectal cancer screening Diabetes screening Glaucoma screening Nutrition counseling    Subjective:  [name] Yu is a 88 y.o. male who presents for Medicare Annual Wellness Visit and follow up. Here with wife.    Recently had nerve block for hx of low back pain, spondylosis without myelopathy or radiculopathy, through [place].  Doing well, reports increase in movement and pain control.  Reports atypical fatigue since yesterday afternoon, "just wiped out," denies specific sx. Initially brady that is atypical, EKG shows normal rate and NSCPT, nonspecific intraventricular conduction delay.   BPH sx well controlled by finasteride, saw urology due to slow stream and elevated PSA. Had poor urine outflow with myrbetriq for urinary frequency and stopped several years back. Per patient had ? TURP of prostate in 90s. Recently followed [facility] with elevated PSA levels.  They have suggested continued surveillance for now.  Lab Results  Component Value Date   PSA 9.68 (H) 05/11/2021   PSA 7.63 (H) 03/02/2021   PSA 4.8 (H) 01/11/2020    he has a diagnosis of depression/anxiety and is currently on celexa 20 mg daily, recently mirtazapine 7.5 mg in the evening by [place]. Reports symptoms are fairly controlled on current regimen.   BMI is Body mass index is 27.86 kg/m., he has not been working on diet and exercise, pain limiting.  Wt Readings from Last 3 Encounters:  06/11/22 194 lb 3.2 oz (88.1 kg)  03/05/22 202 lb 3.2 oz (91.7 kg)  09/05/21 200 lb 12.8 oz (91.1 kg)   Patient has hx/o pAfib since d/c'd consequent of a large rectus sheath  hematoma & is only on LD bASA. Heart cath in 2003 was Negative. Cardiolite in 2007 was also Negative.  Has CAD per imaging, not aggressively pursued due to age 72 with Dr. Rockey Situ   His blood pressure has been controlled at home, today their BP is BP: 118/74 He does not workout. He denies chest pain, shortness of breath, dizziness.   He is not on cholesterol medication due to age. His cholesterol is not at goal. The cholesterol last visit was:   Lab Results  Component Value Date   CHOL 207 (H) 03/05/2022   HDL 53 03/05/2022   LDLCALC 129 (H) 03/05/2022   TRIG 137 03/05/2022   CHOLHDL 3.9 03/05/2022   He has been working on diet and exercise for glucose management, and denies foot ulcerations, increased appetite, nausea, paresthesia of the feet, polydipsia, polyuria, visual disturbances, vomiting and weight loss. Last A1C in the office was:  Lab Results  Component Value Date   HGBA1C 5.6 03/05/2022   Last GFR Lab Results  Component Value Date   GFRNONAA 66 03/02/2021    Patient is on Vitamin D supplement and atgoal of 60 at recent check:    Lab Results  Component Value Date   VD25OH 59 03/05/2022        Medication Review:    Current Outpatient Medications (Cardiovascular):    diltiazem (CARDIZEM) 30 MG tablet, Take 1 tablet (30 mg total) by mouth 2 (two) times daily.  Current Outpatient Medications (Respiratory):    fluticasone (FLONASE) 50 MCG/ACT nasal spray, Place 1 spray into both nostrils as directed.   magic mouthwash (nystatin, lidocaine, diphenhydrAMINE) suspension*, Take 5 mLs by mouth as needed for mouth pain. Includes Dexamethasone as well   loratadine (  CLARITIN) 10 MG tablet, Take 10 mg by mouth daily as needed for allergies. (Patient not taking: Reported on 06/11/2022)  Current Outpatient Medications (Analgesics):    acetaminophen (TYLENOL) 325 MG tablet, Take 325 mg by mouth as needed for moderate pain.   aspirin EC 81 MG tablet, Take 81 mg by mouth 3  (three) times a week. On Monday, Wednesday, and Friday   HYDROcodone-acetaminophen (NORCO/VICODIN) 5-325 MG tablet, Take 0.5 tablets by mouth in the morning and at bedtime.   meloxicam (MOBIC) 15 MG tablet, Take  1 tablet Daily with largest Meal  for Pain & Inflammation (Patient not taking: Reported on 07/10/2021)   Current Outpatient Medications (Other):    ammonium lactate (LAC-HYDRIN) 12 % lotion, Apply 1 application topically as needed for dry skin.   bacitracin 500 UNIT/GM ointment, Apply 1 application topically as needed for wound care.   Calcium Carbonate Antacid (TUMS E-X PO), Take 1 tablet by mouth in the morning and at bedtime.   carboxymethylcellulose 1 % ophthalmic solution, Apply 1 drop to eye at bedtime.   Carboxymethylcellulose Sod PF 0.5 % SOLN, Apply 1 drop to eye in the morning, at noon, in the evening, and at bedtime.   cholecalciferol (VITAMIN D) 25 MCG (1000 UNIT) tablet, Take 1,000 Units by mouth daily.   citalopram (CELEXA) 20 MG tablet, Take  1 tablet  Daily  for Mood   clotrimazole (LOTRIMIN) 1 % cream, Apply 1 application topically 2 (two) times daily. Toe fungus   finasteride (PROSCAR) 5 MG tablet, TAKE 1 TABLET BY MOUTH  DAILY FOR PROSTATE   ketoconazole (NIZORAL) 2 % shampoo, APPLY SHAMPOO TOPICALLY  TWICE WEEKLY AS DIRECTED   magic mouthwash (nystatin, lidocaine, diphenhydrAMINE) suspension*, Take 5 mLs by mouth as needed for mouth pain. Includes Dexamethasone as well   Magnesium 250 MG TABS, Take 1 tablet by mouth at bedtime.   meclizine (ANTIVERT) 25 MG tablet, Take 25 mg by mouth as needed for dizziness.   mirtazapine (REMERON) 7.5 MG tablet, Take 7.5 mg by mouth at bedtime.   Multiple Vitamins-Minerals (EYE MULTIVITAMIN/LUTEIN PO), Take 1 tablet by mouth in the morning and at bedtime.   polyethylene glycol powder (GLYCOLAX/MIRALAX) 17 GM/SCOOP powder, Take 1 Container by mouth as needed for moderate constipation.   sulfamethoxazole-trimethoprim (BACTRIM DS)  800-160 MG tablet, Take 1 tablet 2 x /day with Meals for  UTI / Prostatitis   tamsulosin (FLOMAX) 0.4 MG CAPS capsule, TAKE 1 CAPSULE BY MOUTH AT  BEDTIME FOR PROSTATE   vitamin C (ASCORBIC ACID) 500 MG tablet, Take 500 mg by mouth daily.   senna-docusate (SENOKOT-S) 8.6-50 MG tablet, Take 2 tablets by mouth at bedtime as needed for mild constipation. (Patient not taking: Reported on 06/11/2022) * These medications belong to multiple therapeutic classes and are listed under each applicable group.  Allergies: Allergies  Allergen Reactions   Gabapentin Other (See Comments)    Confusion    Ciprofloxacin     dysphoria   Meloxicam     Other reaction(s): SWEATING   Novocain [Procaine Hcl]    Procaine    Prednisone Anxiety    Nervousness    Current Problems (verified) has Essential hypertension; Abnormal glucose; Hyperlipidemia, mixed; Atrial fibrillation ([place]); Personal history of colonic polyps; ASHD (arteriosclerotic heart disease); Diverticulosis; Vitamin D deficiency; Benign localized prostatic hyperplasia with lower urinary tract symptoms (LUTS); H/O total hip arthroplasty, right; Former pipe smoker; Interstitial lung disease ([place]); Senile purpura ([place]); Elevated PSA; and Prostate cancer ([place]) on their problem list.  Screening Tests Immunization History  Administered Date(s) Administered   DT (Pediatric) 07/06/2014   Influenza Split 07/20/2014, 07/18/2016   Influenza, High Dose Seasonal PF 08/22/2015, 07/15/2017, 07/13/2021   Influenza-Unspecified 06/15/2018, 08/04/2019, 07/14/2020   PFIZER(Purple Top)SARS-COV-2 Vaccination 11/12/2019, 12/07/2019, 07/29/2020   Pfizer Covid-19 Vaccine Bivalent Booster 39yr & up 07/06/2021   Pneumococcal Conjugate-13 07/06/2014   Pneumococcal Polysaccharide-23 06/09/2013   Td 12/15/2016   Zoster, Live 10/15/2005    Preventative care: Last colonoscopy: 2003, hx of polyps, done due to age  Prior vaccinations: TD or Tdap: 2018  Influenza: Due  07/2022 Pneumococcal: 2014 Prevnar13: 2015 Shingles/Zostavax: 2007 discussed shingrix Covid 19: 2/2, 2021, pfizer + booster  Names of Other Physician/Practitioners you currently use: 1. [facility] here for primary care [facility]. [facility] visit 20[facility] 3. [facility]    Patient Care Team: MUnk Pinto MD as PCP - General (Internal Medicine) GRockey SituTKathlene November MD as PCP - Cardiology (Cardiology) MBenito Mccreedy MD as Referring Physician (Family Medicine) GMinna Merritts MD as Consulting Physician (Cardiology) KInda Castle MD (Inactive) as Consulting Physician (Gastroenterology) ERush Landmark [facility] (Pharmacist)  Surgical: He  has a past surgical history that includes Hernia repair; PROSTATE; Total hip arthroplasty (Right, 03/07/2018); Cataract extraction, bilateral (Bilateral, 2019); and Transurethral resection of prostate (1980). Family His family history includes Alzheimer's disease in his mother; Breast cancer in his daughter and sister; Diabetes in his brother; Heart disease in his brother and sister; Kidney cancer in his father; Rectal cancer in his sister; Ulcerative colitis in his sister. Social history  He reports that he quit smoking about 28 years ago. His smoking use included pipe. He has never used smokeless tobacco. He reports that he does not drink alcohol and does not use drugs.  MEDICARE WELLNESS OBJECTIVES: Physical activity: Exercise limited by: orthopedic condition(s) Cardiac risk factors: Cardiac Risk Factors include: advanced age (>548m, >6>39omen);dyslipidemia;male gender Depression/mood screen:      06/11/2022    3:37 PM  Depression screen PHQ 2/9  Decreased Interest 0  Down, Depressed, Hopeless 0  PHQ - 2 Score 0    ADLs:     06/11/2022    3:36 PM 03/04/2022   10:57 PM  In your present state of health, do you have any difficulty  performing the following activities:  Hearing? 0 1  Comment  has bilat hearing aids  Vision? 0 0  Difficulty concentrating or making decisions? 0 0  Walking or climbing stairs? 1 0  Comment uses cane   Dressing or bathing? 0 0  Doing errands, shopping? 1 1  Comment Wife is caretaker wife transports  PrConservation officer, naturend eating ? Y   Comment Wife prepares food   Using the Toilet? N   In the past six months, have you accidently leaked urine? Y   Do you have problems with loss of bowel control? N   Managing your Medications? N   Managing your Finances? N   Housekeeping or managing your Housekeeping? N      Cognitive Testing  Alert? Yes  Normal Appearance?Yes  Oriented to person? Yes  Place? Yes   Time? Yes  Recall of three objects?  Yes  Can perform simple calculations? Yes  Displays appropriate judgment?Yes  Can read the correct time from a watch face?Yes  EOL planning: Does Patient Have a Medical Advance Directive?: Yes Type of Advance Directive: Living will Does patient want to  make changes to medical advance directive?: No - Patient declined   Objective:   Today's Vitals   06/11/22 1431  BP: 118/74  Pulse: 65  Resp: 17  Temp: 97.9 F (36.6 C)  SpO2: 93%  Weight: 194 lb 3.2 oz (88.1 kg)  Height: 5' 10" (1.778 m)     Body mass index is 27.86 kg/m.  General appearance: alert, no distress, WD/WN, male  HEENT: normocephalic, sclerae anicteric, TMs pearly, nares patent, no discharge or erythema, pharynx normal Oral cavity: MMM, no lesions, no white coating, no erythema, very mild gingival enlargement  Neck: supple, no lymphadenopathy, no thyromegaly, no masses Heart: RRR, normal S1, S2, no murmurs  Lungs: CTA bilaterally, no wheezes, rhonchi, or rales Abdomen: +bs, soft, non tender, non distended, no masses, no hepatomegaly, no splenomegaly Musculoskeletal: Limited ROM of lumbar and left hip with pain; no obvious deformity, no effusion. Slow antalgic gait with  cane.  Extremities: no edema, no cyanosis, no clubbing, scattered ecchymoses Pulses: 1+ symmetric, upper and lower extremities, normal cap refill Neurological: alert, oriented x 3, CN2-12 intact, strength normal upper extremities and lower extremities, sensation normal throughout, gait slow, antalgic Psychiatric: normal affect, behavior normal, pleasant  Skin: warm, dry, intact; without rash or concerning lesion  Medicare Attestation I have personally reviewed: The patient's medical and social history Their use of alcohol, tobacco or illicit drugs Their current medications and supplements The patient's functional ability including ADLs,fall risks, home safety risks, cognitive, and hearing and visual impairment Diet and physical activities Evidence for depression or mood disorders  The patient's weight, height, BMI, and visual acuity have been recorded in the chart.  I have made referrals, counseling, and provided education to the patient based on review of the above and I have provided the patient with a written personalized care plan for preventive services.     Darrol Jump, NP   06/11/2022

## 2022-06-11 NOTE — Patient Instructions (Signed)

## 2022-07-13 ENCOUNTER — Ambulatory Visit: Payer: Medicare Other | Admitting: Cardiovascular Disease

## 2022-07-13 NOTE — Progress Notes (Deleted)
Cardiology Office Note  Date:  07/13/2022   ID:  SAHAN PEN, DOB 1927-08-15, MRN [id number]  PCP:  [name], MD   No chief complaint on file.   HPI:  [name] Yu is a 88 yo gentleman with  Paroxysmal atrial fibrillation, 2007 at [facility], cardioversion CT scan  2008 showing coronary calcifications HTN,  Hyperlipidemia sleep apnea,  prior pipe smoking for 20 years. bradycardia on  beta blockers,  Prior history of falls presents for routine followup of his hypertension and atrial fibrillation.   Last seen in clinic September 2022   Very sedentary, legs are getting weaker Presents today in a wheelchair  Rare palpitations, lasting 3 minutes, could be atrial fib Goes away Atrial fib causes sx in arms and head,  No recent falls, has gait instability  Nose bleeds, off asa for now, followed by ENT  Takes diltiazem 30 twice daily Previously on 60 mg twice daily, lower dose secondary to orthostasis Now with rare orthostasis symptoms  Reports his weight is stable   right total hip replacement surgery with Dr. [name] Recovered well  EKG personally reviewed by myself on todays visit Shows normal sinus rhythm with rate 75 left axis deviation, poor R wave progression to the anterior precordial leads   Other past medical history reviewed (formerly on warfarin but d/c'd in 2014 for rectal sheath hematoma), -CT brain: Study is mildly limited by motion. There is a subcutaneous hematoma in the soft tissues of the left frontotemporal region.  XR L foot: Nondisplaced fracture of the distal phalanx of the great toe.  Diagnosed with post-concussive sx  Notes from the hospital indicatePer EMS, patient had a mechanical fall earlier today. Tripped on a sock. [place] and hit his head on the washer machine. Denied LOC. Was GCS 15 on EMS arrival and while en route, patient became more sluggish and confused. States he had a 5/10 headache and was worried about a head injury. Denies any other  injury.  Prior history of smoking for 30 years from age 19 to age  17 Previous CT scan reviewed with him from 2008 showing coronary calcifications    normal cath in 2003.  Normal stress test in 2005 at [facility].   07/2006 had Afib w/u at [facility].  [place] and on cardizem Dose decreased due to bradycardia Another normal stress test in 2007.     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  PSH:    Past Surgical History:  Procedure Laterality Date   CATARACT EXTRACTION, BILATERAL Bilateral 2019   at the [place] Right 03/07/2018   Procedure: RIGHT TOTAL HIP ARTHROPLASTY DIRECT ANTERIOR;  Surgeon: [name], MD;  Location: [place];  Service: Orthopedics;  Laterality: Right;   TRANSURETHRAL RESECTION OF PROSTATE  1980    Current Outpatient Medications  Medication Sig Dispense Refill   acetaminophen (TYLENOL) 325 MG tablet Take 325 mg by mouth as needed for moderate pain.     ammonium lactate (LAC-HYDRIN) 12 % lotion Apply 1 application topically as needed for dry skin.     aspirin EC 81 MG tablet Take 81 mg by mouth 3 (  three) times a week. On Monday, Wednesday, and Friday     bacitracin 500 UNIT/GM ointment Apply 1 application topically as needed for wound care.     Calcium Carbonate Antacid (TUMS E-X PO) Take 1 tablet by mouth in the morning and at bedtime.     carboxymethylcellulose 1 % ophthalmic solution Apply 1 drop to eye at bedtime.     Carboxymethylcellulose Sod PF 0.5 % SOLN Apply 1 drop to eye in the morning, at noon, in the evening, and at bedtime.      cholecalciferol (VITAMIN D) 25 MCG (1000 UNIT) tablet Take 1,000 Units by mouth daily.     citalopram (CELEXA) 20 MG tablet Take  1 tablet  Daily  for Mood 90 tablet 3   clotrimazole (LOTRIMIN) 1 % cream Apply 1 application topically 2 (two) times daily. Toe fungus     diltiazem (CARDIZEM) 30 MG tablet Take 1 tablet (30 mg total) by mouth 2 (two) times daily. 180 tablet 3   finasteride (PROSCAR) 5 MG tablet TAKE 1 TABLET BY MOUTH  DAILY FOR PROSTATE 90 tablet 3   fluticasone (FLONASE) 50 MCG/ACT nasal spray Place 1 spray into both nostrils as directed.     HYDROcodone-acetaminophen (NORCO/VICODIN) 5-325 MG tablet Take 0.5 tablets by mouth in the morning and at bedtime.     ketoconazole (NIZORAL) 2 % shampoo APPLY SHAMPOO TOPICALLY  TWICE WEEKLY AS DIRECTED 120 mL 99   loratadine (CLARITIN) 10 MG tablet Take 10 mg by mouth daily as needed for allergies. (Patient not taking: Reported on 06/11/2022)     magic mouthwash (nystatin, lidocaine, diphenhydrAMINE) suspension Take 5 mLs by mouth as needed for mouth pain. Includes Dexamethasone as well     Magnesium 250 MG TABS Take 1 tablet by mouth at bedtime.     meclizine (ANTIVERT) 25 MG tablet Take 25 mg by mouth as needed for dizziness.     meloxicam (MOBIC) 15 MG tablet Take  1 tablet Daily with largest Meal  for Pain & Inflammation (Patient not taking: Reported on 07/10/2021) 90 tablet 3   mirtazapine (REMERON) 7.5 MG tablet Take 7.5 mg by mouth at bedtime.     Multiple Vitamins-Minerals (EYE MULTIVITAMIN/LUTEIN PO) Take 1 tablet by mouth in the morning and at bedtime.     polyethylene glycol powder (GLYCOLAX/MIRALAX) 17 GM/SCOOP powder Take 1 Container by mouth as needed for moderate constipation.     senna-docusate (SENOKOT-S) 8.6-50 MG tablet Take 2 tablets by mouth at bedtime as needed for mild constipation. (Patient not taking: Reported on 06/11/2022)     sulfamethoxazole-trimethoprim (BACTRIM DS) 800-160 MG tablet Take 1 tablet 2 x /day with Meals  for  UTI / Prostatitis 42 tablet 0   tamsulosin (FLOMAX) 0.4 MG CAPS capsule TAKE 1 CAPSULE BY MOUTH AT  BEDTIME FOR PROSTATE 90 capsule 3   vitamin C (ASCORBIC ACID) 500 MG tablet Take 500 mg by mouth daily.     No current facility-administered medications for this visit.    Allergies:   Gabapentin, Ciprofloxacin, Meloxicam, Novocain [procaine hcl], Procaine, and Prednisone   Social History:  The patient  reports that he quit smoking about 28 years ago. His smoking use included pipe. He has never used smokeless tobacco. He reports that he does not drink alcohol and does not use drugs.   Family History:   family history includes Alzheimer's disease in his mother; Breast cancer in his daughter and sister; Diabetes in his brother; Heart disease in his brother and sister;  Kidney cancer in his father; Rectal cancer in his sister; Ulcerative colitis in his sister.    Review of Systems: Review of Systems  Constitutional: Negative.   Respiratory: Negative.    Cardiovascular: Negative.   Gastrointestinal: Negative.   Musculoskeletal: Negative.        Gait instability  Neurological: Negative.   Psychiatric/Behavioral: Negative.    All other systems reviewed and are negative.     PHYSICAL EXAM: VS:  There were no vitals taken for this visit. , BMI There is no height or weight on file to calculate BMI.  Presenting with family, in a wheelchair Constitutional:  oriented to person, place, and time. No distress.  HENT:  Head: Grossly normal Eyes:  no discharge. No scleral icterus.  Neck: No JVD, no carotid bruits  Cardiovascular: Regular rate and rhythm, no murmurs appreciated Pulmonary/Chest: Clear to auscultation bilaterally, no wheezes or rails Abdominal: Soft.  no distension.  no tenderness.  Musculoskeletal: Normal range of motion Neurological:  normal muscle tone. Coordination normal. No atrophy Skin: Skin warm and dry Psychiatric: normal affect, pleasant   Recent  Labs: 03/05/2022: ALT 8; BUN 12; Creat 0.89; Hemoglobin 15.6; Magnesium 2.0; Platelets 214; Potassium 4.0; Sodium 139; TSH 2.70    Lipid Panel Lab Results  Component Value Date   CHOL 207 (H) 03/05/2022   HDL 53 03/05/2022   LDLCALC 129 (H) 03/05/2022   TRIG 137 03/05/2022      Wt Readings from Last 3 Encounters:  06/11/22 194 lb 3.2 oz (88.1 kg)  03/05/22 202 lb 3.2 oz (91.7 kg)  09/05/21 200 lb 12.8 oz (91.1 kg)       ASSESSMENT AND PLAN:  Essential hypertension - Blood pressure is well controlled on today's visit. No changes made to the medications.  Mixed hyperlipidemia -  he does not want a statin despite prior CT scan 2008 showing coronary calcification Denies anginal symptoms  Paroxysmal atrial fibrillation ([place]) - Plan: EKG 12-Lead No recent episodes of prolonged tachycardia, having rare short episodes lasting less than 3 minutes On and off aspirin Previous fall with trauma to the left temporal region Also previous hematoma left ABD on warfarin Not on anticoagulation given high risk Atrial fibrillation back in 2007, no clear documentation of atrial fibrillation since that time Risk of anticoagulation higher than foreseen benefit  Coronary artery disease due to lipid rich plaque -  Currently with no symptoms of angina. No further workup at this time. Continue current medication regimen.  OSA (obstructive sleep apnea) -  Managed by primary care  History of fall, leg weakness No recent falls  Legs weak, uses walker and wheelchair   Total encounter time more than 25 minutes  Greater than 50% was spent in counseling and coordination of care with the patient    No orders of the defined types were placed in this encounter.     Signed, Esmond Plants, M.D., Ph.D. 07/13/2022  [place], [place]

## 2022-08-06 ENCOUNTER — Encounter: Payer: Self-pay | Admitting: Internal Medicine

## 2022-08-07 ENCOUNTER — Ambulatory Visit: Payer: Medicare Other | Attending: Cardiovascular Disease | Admitting: Medical

## 2022-08-07 ENCOUNTER — Ambulatory Visit
Admission: RE | Admit: 2022-08-07 | Discharge: 2022-08-07 | Disposition: A | Discharge status: Home / Self Care | Payer: Medicare Other | Source: Ambulatory Visit | Attending: Cardiovascular Disease | Admitting: Cardiovascular Disease

## 2022-08-07 ENCOUNTER — Other Ambulatory Visit
Admission: RE | Admit: 2022-08-07 | Discharge: 2022-08-07 | Disposition: A | Discharge status: Home / Self Care | Payer: Medicare Other | Source: Ambulatory Visit | Attending: Cardiovascular Disease | Admitting: Cardiovascular Disease

## 2022-08-07 ENCOUNTER — Encounter: Payer: Self-pay | Admitting: Medical

## 2022-08-07 VITALS — BP 126/80 | HR 64 | Ht 68.5 in | Wt 201.0 lb

## 2022-08-07 DIAGNOSIS — I2583 Coronary atherosclerosis due to lipid rich plaque: Secondary | ICD-10-CM

## 2022-08-07 DIAGNOSIS — R0609 Other forms of dyspnea: Secondary | ICD-10-CM

## 2022-08-07 DIAGNOSIS — I480 Paroxysmal atrial fibrillation: Secondary | ICD-10-CM | POA: Diagnosis not present

## 2022-08-07 DIAGNOSIS — R0989 Other specified symptoms and signs involving the circulatory and respiratory systems: Secondary | ICD-10-CM | POA: Diagnosis not present

## 2022-08-07 DIAGNOSIS — I2510 Atherosclerotic heart disease of native coronary artery without angina pectoris: Secondary | ICD-10-CM | POA: Diagnosis not present

## 2022-08-07 DIAGNOSIS — G4733 Obstructive sleep apnea (adult) (pediatric): Secondary | ICD-10-CM

## 2022-08-07 LAB — CBC
HCT: 47.9 % (ref 39.0–52.0)
Hemoglobin: 15.6 g/dL (ref 13.0–17.0)
MCH: 31.7 pg (ref 26.0–34.0)
MCHC: 32.6 g/dL (ref 30.0–36.0)
MCV: 97.4 fL (ref 80.0–100.0)
Platelets: 228 10*3/uL (ref 150–400)
RBC: 4.92 MIL/uL (ref 4.22–5.81)
RDW: 13.4 % (ref 11.5–15.5)
WBC: 10.8 10*3/uL — ABNORMAL HIGH (ref 4.0–10.5)
nRBC: 0 % (ref 0.0–0.2)

## 2022-08-07 LAB — BRAIN NATRIURETIC PEPTIDE: B Natriuretic Peptide: 56.9 pg/mL (ref 0.0–100.0)

## 2022-08-07 NOTE — Patient Instructions (Signed)
Medication Instructions:   Your physician recommends that you continue on your current medications as directed. Please refer to the Current Medication list given to you today.  *If you need a refill on your cardiac medications before your next appointment, please call your pharmacy*   Lab Work:  CBC / BNP - at the medical mall   If you have labs (blood work) drawn today and your tests are completely normal, you will receive your results only by: [place] (if you have MyChart) OR A paper copy in the mail If you have any lab test that is abnormal or we need to change your treatment, we will call you to review the results.   Testing/Procedures:  Chest x-ray at the medical mall  2. Echocardiogram  Your physician has requested that you have an echocardiogram. Echocardiography is a painless test that uses sound waves to create images of your heart. It provides your doctor with information about the size and shape of your heart and how well your heart's chambers and valves are working. This procedure takes approximately one hour. There are no restrictions for this procedure. Please note; depending on visual quality an IV may need to be placed.    Follow-Up: At [facility], you and your health needs are our priority.  As part of our continuing mission to provide you with exceptional heart care, we have created designated Provider Care Teams.  These Care Teams include your primary Cardiologist (physician) and Advanced Practice Providers (APPs -  Physician Assistants and Nurse Practitioners) who all work together to provide you with the care you need, when you need it.  We recommend signing up for the patient portal called "MyChart".  Sign up information is provided on this After Visit Summary.  MyChart is used to connect with patients for Virtual Visits (Telemedicine).  Patients are able to view lab/test results, encounter notes, upcoming appointments, etc.  Non-urgent messages  can be sent to your provider as well.   To learn more about what you can do with MyChart, go to [email or web address].    Your next appointment:   1 month(s)  The format for your next appointment:   In Person  Provider:   You may see Ida Rogue, MD or one of the following Advanced Practice Providers on your designated Care Team:   Murray Hodgkins, NP Christell Faith, PA-C Cadence Kathlen Mody, PA-C Gerrie Nordmann, NP

## 2022-08-07 NOTE — Progress Notes (Signed)
Cardiology Office Note:    Date:  08/07/2022   ID:  [name] Yu, DOB 1927/04/09, MRN [id number]  PCP:  [name], MD  [facility] Cardiologist:  Ida Rogue, MD  [place] Electrophysiologist:  None   Referring MD: [name], MD   Chief Complaint: 12 month follow-up  History of Present Illness:    [name] Yu is a 88 y.o. male with a hx of paroxysmal A-fib status post cardioversion in 2007 at [facility], coronary calcifications on CT scan in 2008, hypertension, hyperlipidemia, OSA, prior smoking, bradycardia and beta-blockers, history of falls who presents for 40-monthfollow-up.  Patient had a normal cath in 2003, [place]  [facility].  She had a subsequent normal stress test in 2007.  Patient was last seen 07/10/2021 reported intermittent A-fib episodes. He is not on anticoagulation due to risk of fall. He was taking aspirin on and off.  Today, the patient reports he has been doing well from a cardiac standpoint. BP and HR are normal. Prostate numbers are being monitored.  He reports minimal dyspnea on exertion.  He denies chest pain, lower leg edema, orthopnea, pnd. He has OSA, does not need a machine due to weight loss. No palpitations, lightheadedness or dizziness. EKG shows NSR.   Past Medical History:  Diagnosis Date   Arthritis    ASHD (arteriosclerotic heart disease)    Atrial fib/flutter, transient    off coumadin since 11/2013- CHADSVAC of 2   Cancer ([place]    BASAL CELL    Colon polyps    2003 hyperplastic and tubuler adenoma   Depression    Diverticulosis    Dyslipidemia    Dysrhythmia    FHx: heart disease 08/11/2018   History of kidney stones    Hypertension    OSA (obstructive sleep apnea)    NO CPAP IN 10+ YEARS   Pre-diabetes    Rectus sheath hematoma 11/21/2013   Dr. KDeatra Ina   Status post total replacement of right hip 03/17/2018     Past Surgical History:  Procedure Laterality Date   CATARACT EXTRA[place], BILATERAL [place]ilateral 2019   [place] [facility] [place] [facility]   [place]  Surgeon: YMarybelle Killings MD;  [place]  Service: Orthopedics;  Laterality: Right;   TRANSURETHRAL RESECTION OF PROSTATE  1980    Current Medications: Current Meds  Medication Sig   acetaminophen (TYLENOL) 325 MG tablet Take 325 mg by mouth as needed for moderate pain.   ammonium lactate (LAC-HYDRIN) 12 % lotion Apply 1 application topically as needed for dry skin.   aspirin EC 81 MG tablet Take 81 mg by mouth 3 (three) times a week. On Monday, Wednesday, and Friday   bacitracin 500 UNIT/GM ointment Apply 1 application topically as needed for wound care.   Calcium Carbonate Antacid (TUMS E-X PO) Take 1 tablet by mouth in the morning and at bedtime.   carboxymethylcellulose 1 % ophthalmic solution Apply 1 drop to eye at bedtime.   Carboxymethylcellulose Sod PF 0.5 % SOLN Apply 1 drop to eye in the morning, at noon, in the evening, and at bedtime.   cholecalciferol (VITAMIN D) 25 MCG (1000 UNIT) tablet Take 1,000 Units by mouth daily.   citalopram (CELEXA) 20 MG  tablet Take  1 tablet  Daily  for Mood   clotrimazole (LOTRIMIN) 1 % cream Apply 1 application topically 2 (two) times daily. Toe fungus   diltiazem (CARDIZEM) 30 MG tablet Take 1 tablet (30 mg total) by mouth 2 (two) times daily.   finasteride (PROSCAR) 5 MG tablet TAKE 1 TABLET BY MOUTH  DAILY FOR PROSTATE   fluticasone (FLONASE) 50 MCG/ACT nasal spray Place 1 spray into both nostrils as directed.   HYDROcodone-acetaminophen (NORCO/VICODIN) 5-325 MG tablet Take 0.5 tablets by mouth in the morning and at bedtime.   ketoconazole (NIZORAL) 2 % shampoo APPLY SHAMPOO TOPICALLY  TWICE WEEKLY AS DIRECTED   loratadine (CLARITIN) 10 MG tablet Take 10 mg by mouth daily as  needed for allergies.   magic mouthwash (nystatin, lidocaine, diphenhydrAMINE) suspension Take 5 mLs by mouth as needed for mouth pain. Includes Dexamethasone as well   Magnesium 250 MG TABS Take 1 tablet by mouth at bedtime.   meclizine (ANTIVERT) 25 MG tablet Take 25 mg by mouth as needed for dizziness.   meloxicam (MOBIC) 15 MG tablet Take  1 tablet Daily with largest Meal  for Pain & Inflammation   mirtazapine (REMERON) 7.5 MG tablet Take 7.5 mg by mouth at bedtime.   Multiple Vitamins-Minerals (EYE MULTIVITAMIN/LUTEIN PO) Take 1 tablet by mouth in the morning and at bedtime.   polyethylene glycol powder (GLYCOLAX/MIRALAX) 17 GM/SCOOP powder Take 1 Container by mouth as needed for moderate constipation.   senna-docusate (SENOKOT-S) 8.6-50 MG tablet Take 2 tablets by mouth at bedtime as needed for mild constipation.   sulfamethoxazole-trimethoprim (BACTRIM DS) 800-160 MG tablet Take 1 tablet 2 x /day with Meals for  UTI / Prostatitis   tamsulosin (FLOMAX) 0.4 MG CAPS capsule TAKE 1 CAPSULE BY MOUTH AT  BEDTIME FOR PROSTATE   vitamin C (ASCORBIC ACID) 500 MG tablet Take 500 mg by mouth daily.     Allergies:   Gabapentin, Ciprofloxacin, Meloxicam, Novocain [procaine hcl], Procaine, and Prednisone   Social History   Socioeconomic History   Marital status: Married    Spouse name: Not on file   Number of children: 5   Years of education: Not on file   Highest education level: Not on file  Occupational History   Occupation: Retired  Tobacco Use   Smoking status: Former    Types: Pipe    Quit date: 09/14/1993    Years since quitting: 28.9   Smokeless tobacco: Never  Vaping Use   Vaping Use: Never used  Substance and Sexual Activity   Alcohol use: No   Drug use: No   Sexual activity: Not on file  Other Topics Concern   Not on file  Social History Narrative   Not on file   Social Determinants of Health   Financial Resource Strain: Not on file  Food Insecurity: No Food  Insecurity (08/31/2021)   Hunger Vital Sign    Worried About Running Out of Food in the Last Year: Never true    Ran Out of Food in the Last Year: Never true  Transportation Needs: Not on file  Physical Activity: Not on file  Stress: Not on file  Social Connections: Not on file     Family History: The patient's family history includes Alzheimer's disease in his mother; Breast cancer in his daughter and sister; Diabetes in his brother; Heart disease in his brother and sister; Kidney cancer in his father; Rectal cancer in his sister; Ulcerative colitis in his sister.  ROS:  Please see the history of present illness.     All other systems reviewed and are negative.  EKGs/Labs/Other Studies Reviewed:    The following studies were reviewed today: N/A  EKG:  EKG is ordered today.  The ekg ordered today demonstrates normal sinus rhythm, 64 bpm, PACs, LAD, incomplete LBBB, no significant ST-T wave changes.  Recent Labs: 03/05/2022: ALT 8; BUN 12; Creat 0.89; Magnesium 2.0; Potassium 4.0; Sodium 139; TSH 2.70 08/07/2022: Hemoglobin 15.6; Platelets 228  Recent Lipid Panel    Component Value Date/Time   CHOL 207 (H) 03/05/2022 1103   TRIG 137 03/05/2022 1103   HDL 53 03/05/2022 1103   CHOLHDL 3.9 03/05/2022 1103   VLDL 37 (H) 04/08/2017 1432   LDLCALC 129 (H) 03/05/2022 1103    Physical Exam:    VS:  BP 126/80 (BP Location: Left Arm, Patient Position: Sitting, Cuff Size: Normal)   Pulse 64   Ht 5' 8.5" (1.74 m)   Wt 201 lb (91.2 kg)   SpO2 93%   BMI 30.12 kg/m     Wt Readings from Last 3 Encounters:  08/07/22 201 lb (91.2 kg)  06/11/22 194 lb 3.2 oz (88.1 kg)  03/05/22 202 lb 3.2 oz (91.7 kg)     GEN:  Well nourished, well developed in no acute distress HEENT: Normal NECK: No JVD; No carotid bruits LYMPHATICS: No lymphadenopathy CARDIAC: RRR, no murmurs, rubs, gallops RESPIRATORY: Crackles at bases ABDOMEN: Soft, non-tender, non-distended MUSCULOSKELETAL:  No edema;  No deformity  SKIN: Warm and dry NEUROLOGIC:  Alert and oriented x 3 PSYCHIATRIC:  Normal affect   ASSESSMENT:    1. Dyspnea on exertion   2. Abnormal lung sounds   3. Paroxysmal atrial fibrillation (HCC)   4. Coronary artery disease due to lipid rich plaque   5. OSA (obstructive sleep apnea)    PLAN:    In order of problems listed above:  Dyspnea on exertion Abnormal lung sounds Crackles noted at bilateral lung bases.  Patient reports chronic cough with some upper respiratory symptoms.  He denies recent fever or chills.  He has minimal dyspnea on exertion.  I will check a chest x-ray, CBC, BNP. I will also update echocardiogram  Paroxysmal A-fib EKG shows normal sinus rhythm with a heart rate of 64 bpm. It was felt risks are greater than benefits with anticoagulation due to history of falls. Patient is on aspirin 81 mg daily.   Coronary artery calcification Patient denies chest pain.  Plan for echo as above for DOE/crackles at lung bases.  Continue aspirin.  Does not appear he is on a statin.  OSA Patient reports prior 40 pound weight loss, he no longer needs CPAP.  Disposition: Follow up in 1 month(s) with APP/MD     Signed, Nicholai Willette Ninfa Meeker, PA-C  08/07/2022 3:21 PM    [facility]

## 2022-08-17 ENCOUNTER — Telehealth: Payer: Self-pay

## 2022-08-17 NOTE — Telephone Encounter (Signed)
-----   Message from Minna Merritts, MD sent at 08/14/2022  8:25 AM EDT ----- Chest x-ray ordered on recent visit with Cadence. Appears to have some fibrosis at the bases likely appreciated on clinical exam Could consider referral to pulmonary if desired

## 2022-08-17 NOTE — Telephone Encounter (Signed)
Left message for [name] Yu to call back.  

## 2022-08-19 ENCOUNTER — Encounter: Payer: Self-pay | Admitting: Internal Medicine

## 2022-08-21 NOTE — Telephone Encounter (Signed)
Left message for [name] Yu to call back.  

## 2022-08-22 NOTE — Telephone Encounter (Signed)
Pt has viewed results on MyChart.

## 2022-08-30 ENCOUNTER — Ambulatory Visit: Payer: Medicare Other | Attending: Cardiovascular Disease

## 2022-08-30 DIAGNOSIS — I77810 Thoracic aortic ectasia: Secondary | ICD-10-CM

## 2022-08-30 DIAGNOSIS — I080 Rheumatic disorders of both mitral and aortic valves: Secondary | ICD-10-CM

## 2022-08-30 DIAGNOSIS — R0989 Other specified symptoms and signs involving the circulatory and respiratory systems: Secondary | ICD-10-CM | POA: Diagnosis not present

## 2022-08-30 LAB — ECHOCARDIOGRAM COMPLETE
AR max vel: 3.12 cm2
AV Area VTI: 3.52 cm2
AV Area mean vel: 3.21 cm2
AV Mean grad: 3 mmHg
AV Peak grad: 4.6 mmHg
Ao pk vel: 1.08 m/s
Area-P 1/2: 4.17 cm2
Calc EF: 64.4 %
S' Lateral: 3.4 cm
Single Plane A2C EF: 59.6 %
Single Plane A4C EF: 68.2 %

## 2022-09-10 ENCOUNTER — Telehealth: Payer: Self-pay | Admitting: Cardiovascular Disease

## 2022-09-10 ENCOUNTER — Ambulatory Visit: Payer: Medicare Other | Admitting: Medical

## 2022-09-10 NOTE — Telephone Encounter (Signed)
OK per Cadence Kathlen Mody, PA-C See below Thank you!

## 2022-09-10 NOTE — Telephone Encounter (Signed)
  Patient Consent for Virtual Visit        [name] Yu has provided verbal consent on 09/10/2022 for a virtual visit (video or telephone).   CONSENT FOR VIRTUAL VISIT FOR:  [name] Yu  By participating in this virtual visit I agree to the following:  I hereby voluntarily request, consent and authorize [facility] and its employed or contracted physicians, physician assistants, nurse practitioners or other licensed health care professionals (the Practitioner), to provide me with telemedicine health care services (the "Services") as deemed necessary by the treating Practitioner. I acknowledge and consent to receive the Services by the Practitioner via telemedicine. I understand that the telemedicine visit will involve communicating with the Practitioner through live audiovisual communication technology and the disclosure of certain medical information by electronic transmission. I acknowledge that I have been given the opportunity to request an in-person assessment or other available alternative prior to the telemedicine visit and am voluntarily participating in the telemedicine visit.  I understand that I have the right to withhold or withdraw my consent to the use of telemedicine in the course of my care at any time, without affecting my right to future care or treatment, and that the Practitioner or I may terminate the telemedicine visit at any time. I understand that I have the right to inspect all information obtained and/or recorded in the course of the telemedicine visit and may receive copies of available information for a reasonable fee.  I understand that some of the potential risks of receiving the Services via telemedicine include:  Delay or interruption in medical evaluation due to technological equipment failure or disruption; Information transmitted may not be sufficient (e.g. poor resolution of images) to allow for appropriate medical decision making by the  Practitioner; and/or  In rare instances, security protocols could fail, causing a breach of personal health information.  Furthermore, I acknowledge that it is my responsibility to provide information about my medical history, conditions and care that is complete and accurate to the best of my ability. I acknowledge that Practitioner's advice, recommendations, and/or decision may be based on factors not within their control, such as incomplete or inaccurate data provided by me or distortions of diagnostic images or specimens that may result from electronic transmissions. I understand that the practice of medicine is not an exact science and that Practitioner makes no warranties or guarantees regarding treatment outcomes. I acknowledge that a copy of this consent can be made available to me via my patient portal ([facility]), or I can request a printed copy by calling the office of [facility].    I understand that my insurance will be billed for this visit.   I have read or had this consent read to me. I understand the contents of this consent, which adequately explains the benefits and risks of the Services being provided via telemedicine.  I have been provided ample opportunity to ask questions regarding this consent and the Services and have had my questions answered to my satisfaction. I give my informed consent for the services to be provided through the use of telemedicine in my medical care

## 2022-09-10 NOTE — Progress Notes (Deleted)
Cardiology Office Note:    Date:  09/10/2022   ID:  [name] Yu, DOB 07/07/1927, MRN [id number]  PCP:  [name] Pinto, MD  [facility] Cardiologist:  [name] Rogue, MD  [facility] Electrophysiologist:  None   Referring MD: [name] Pinto, MD   Chief Complaint: 1 month follow-up  History of Present Illness:    [name] Yu is a 88 y.o. male with a hx of paroxysmal A-fib status post cardioversion in 2007 at [facility], coronary calcifications on CT scan in 2008, hypertension, hyperlipidemia, OSA, prior smoking, bradycardia and beta-blockers, history of falls who presents for 60-monthfollow-up.   Patient had a normal cath in 2003, [place]  [facility].  She had a subsequent normal stress test in 2007.   Patient was seen 07/10/2021 reported intermittent A-fib episodes. He is not on anticoagulation due to risk of fall. He was taking aspirin on and off.  The patient was last seen 08/07/22 and reported dyspnea on exertion. CXR, labs and an echo were ordered.   Echo showed LVEF 50-55%, no WMA, G1DD, mild MR, mild aortic root dilation measuring 461m CXR showed fibrotic changes. BNP and CBC were stable.   Today,   Today,   Past Medical History:  Diagnosis Date   Arthritis    ASHD (arteriosclerotic heart disease)    Atrial fib/flutter, transient    off coumadin since 11/2013- [facility] of 2   [place]   BASAL CELL    Colon polyps    2003 hyperplastic and tubuler adenoma   Depression    Diverticulosis    Dyslipidemia    Dysrhythmia    FHx: heart disease 08/11/2018   History of kidney stones    Hypertension    OSA (obstructive sleep apnea)    NO CPAP IN 10+ YEARS   Pre-diabetes    Rectus sheath hematoma 11/21/2013   Dr. 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     Allergies:   Gabapentin, Ciprofloxacin, Meloxicam, Novocain [procaine hcl], Procaine, and Prednisone   Social History   Socioeconomic History   Marital status: Married    Spouse name: Not on file   Number of children: 5   Years of education: Not on file   Highest education level: Not on file  Occupational History   Occupation: Retired  Tobacco Use   Smoking status: Former    Types: Pipe    Quit date: 09/14/1993    Years since quitting: 29.0   Smokeless tobacco: Never  Vaping Use   Vaping Use: Never used  Substance and Sexual Activity   Alcohol use: No   Drug use: No   Sexual activity: Not on file  Other Topics Concern   Not on file  Social History Narrative   Not on file  Social Determinants of Health   Financial Resource Strain: Not on file  Food Insecurity: No Food Insecurity (08/31/2021)   Hunger Vital Sign    Worried About Running Out of Food in the Last Year: Never true    Ran Out of Food in the Last Year: Never true  Transportation Needs: Not on file  Physical Activity: Not on file  Stress: Not on file  Social Connections: Not on file     Family History: The patient's ***family history includes Alzheimer's disease in his mother; Breast cancer in his daughter and sister; Diabetes in his brother; Heart disease in his brother and sister; Kidney cancer in his father; Rectal cancer in his sister; Ulcerative colitis in his sister.  ROS:   Please see the history of present illness.    *** All  other systems reviewed and are negative.  EKGs/Labs/Other Studies Reviewed:    The following studies were reviewed today: ***  EKG:  EKG is *** ordered today.  The ekg ordered today demonstrates ***  Recent Labs: 03/05/2022: ALT 8; BUN 12; Creat 0.89; Magnesium 2.0; Potassium 4.0; Sodium 139; TSH 2.70 08/07/2022: B Natriuretic Peptide 56.9; Hemoglobin 15.6; Platelets 228  Recent Lipid Panel    Component Value Date/Time   CHOL 207 (Yu) 03/05/2022 1103   TRIG 137 03/05/2022 1103   HDL 53 03/05/2022 1103   CHOLHDL 3.9 03/05/2022 1103   VLDL 37 (Yu) 04/08/2017 1432   LDLCALC 129 (Yu) 03/05/2022 1103     Risk Assessment/Calculations:   {Does this patient have ATRIAL FIBRILLATION?:[phone]}   Physical Exam:    VS:  There were no vitals taken for this visit.    Wt Readings from Last 3 Encounters:  08/07/22 201 lb (91.2 kg)  06/11/22 194 lb 3.2 oz (88.1 kg)  03/05/22 202 lb 3.2 oz (91.7 kg)     GEN: *** Well nourished, well developed in no acute distress HEENT: Normal NECK: No JVD; No carotid bruits LYMPHATICS: No lymphadenopathy CARDIAC: ***RRR, no murmurs, rubs, gallops RESPIRATORY:  Clear to auscultation without rales, wheezing or rhonchi  ABDOMEN: Soft, non-tender, non-distended MUSCULOSKELETAL:  No edema; No deformity  SKIN: Warm and dry NEUROLOGIC:  Alert and oriented x 3 PSYCHIATRIC:  Normal affect   ASSESSMENT:    No diagnosis found. PLAN:    In order of problems listed above:  ***  Disposition: Follow up {follow up:15908} with ***   Shared Decision Making/Informed Consent   {Are you ordering a CV Procedure (e.g. stress test, cath, DCCV, TEE, etc)?   Press F2        :[id number]}    Signed, Emilea Goga Arlyss Repress  09/10/2022 7:39 AM    [facility]

## 2022-09-12 ENCOUNTER — Ambulatory Visit: Payer: Medicare Other | Admitting: Internal Medicine

## 2022-09-13 ENCOUNTER — Ambulatory Visit: Payer: Medicare Other | Admitting: Medical

## 2022-10-30 ENCOUNTER — Ambulatory Visit: Payer: Medicare Other | Admitting: Internal Medicine

## 2022-11-19 ENCOUNTER — Ambulatory Visit (INDEPENDENT_AMBULATORY_CARE_PROVIDER_SITE_OTHER): Payer: Medicare Other | Admitting: Internal Medicine

## 2022-11-19 ENCOUNTER — Encounter: Payer: Self-pay | Admitting: Internal Medicine

## 2022-11-19 VITALS — BP 122/70 | HR 65 | Temp 97.0°F | Resp 12 | Ht 70.0 in | Wt 197.8 lb

## 2022-11-19 DIAGNOSIS — R7309 Other abnormal glucose: Secondary | ICD-10-CM

## 2022-11-19 DIAGNOSIS — E559 Vitamin D deficiency, unspecified: Secondary | ICD-10-CM

## 2022-11-19 DIAGNOSIS — I10 Essential (primary) hypertension: Secondary | ICD-10-CM | POA: Diagnosis not present

## 2022-11-19 DIAGNOSIS — E782 Mixed hyperlipidemia: Secondary | ICD-10-CM

## 2022-11-19 DIAGNOSIS — Z79899 Other long term (current) drug therapy: Secondary | ICD-10-CM

## 2022-11-19 NOTE — Progress Notes (Signed)
Future Appointments  Date Time Provider Department  11/19/2022                  9 mo ov  2:30 PM [name], MD [facility]  03/08/2023                cpe 11:00 AM [name], MD [facility]  06/12/2023                wellness  2:00 PM Darrol Jump, NP [facility]    History of Present Illness:       This very nice 89 y.o.  MWM presents for 9  month follow up with HTN, HLD, Pre-Diabetes, hx/o Major Depression  and Vitamin D Deficiency.  Followed at [facility] & also at [facility] for Prostate Ca by active surveillance .         Patient is treated for HTN  (2005) & BP has been controlled at home. Today's BP is at goal -  122/70.  In 2007 started on Coumadin for Afib til had a large  large spontaneous rectus sheath hematoma  & coumadin stopped.  Patient had a Negative Heart Cath in 2003 and 2 negative Cardiolite's in 2005 & 2007.  Patient has had no complaints of any cardiac type chest pain, palpitations, dyspnea Vertell Limber /PND, dizziness, claudication or dependent edema.        Hyperlipidemia is not controlled with diet . Last Lipids were not at goal :  Lab Results  Component Value Date   CHOL 207 (H) 03/05/2022   HDL 53 03/05/2022   LDLCALC 129 (H) 03/05/2022   TRIG 137 03/05/2022   CHOLHDL 3.9 03/05/2022     Also, the patient has history of PreDiabetes  (A1c 5.7% /2014)  and has had no symptoms of reactive hypoglycemia, diabetic polys, paresthesias or visual blurring.  Last A1c was at goal :  Lab Results  Component Value Date   HGBA1C 5.6 03/05/2022                                                          Further, the patient also has history of Vitamin D Deficiency  ("33" /2008)  and supplements vitamin D . Last vitamin D was at goal:  Lab Results  Component Value Date   VD25OH 59 03/05/2022      Current Outpatient Medications on File Prior to Visit  Medication Sig   acetaminophen 325 MG tablet Take as needed for moderate pain.    ammonium lactate (LAC-HYDRIN) 12 % lotion Apply 1 application topically as needed for dry skin.   aspirin EC 81 MG tablet Take 3 times a week. On MWF   bacitracin 500 UNIT/GM ointment Apply 1 application topically as needed for wound care.   TUMS E-X  Take 1 tablet in the morning and at bedtime.   carboxymethylcellulose 1 % ophth soln Apply 1 drop to eye at bedtime.   Carboxymethylcellulose Sod PF 0.5 % SOLN Apply 1 drop to eye - morning, noon, evening &  bedtime.   VITAMIN D  1,000 Units  Take daily.   citalopram 20 MG tablet Take  1 tablet  Daily  for Mood   clotrimazole  1 % cream Apply 1 application topically 2   times daily. Toe fungus   diltiazem  30 MG tablet Take 1 tablet   2  times daily.   finasteride 5 MG tablet TAKE 1 TABLET DAILY FOR PROSTATE   FLONASE  nasal spray Place 1 spray into both nostrils as directed.   Hydrocodone -APAP 5-325  Take 0.5 tablets  in the morning and at bedtime.   NIZORAL 2 % shampoo SHAMPOO   TWICE WEEKLY A   loratadine 10 MG tablet Take  daily as needed for allergies.   magic mouthwash (nystatin, lidocaine, diphenhydrAMINE) susp Take 5 mLs  as needed for mouth pain.   Magnesium 250 MG TABS Take 1 tablet at bedtime.   meclizine 25 MG tablet Take 25 mg  as needed    meloxicam  15 MG tablet Take  1 tablet Daily    mirtazapine 7.5 MG tablet Take at bedtime.   EYE MULTIVITAMIN/LUTEIN  Take 1 tablet  in the morning and at bedtime.   polyethylene glycol powder 17 GM/SCOOP  Take as needed for moderate constipation.   SENOKOT-S 8.6-50 MG tablet Take 2 tablets at bedtime as needed    tamsulosin (FLOMAX) 0.4 MG  TAKE 1 CAPSULE AT  BEDTIME    vitamin C 500 MG tablet Take daily.     Allergies  Allergen Reactions   Gabapentin Confusion   Ciprofloxacin     dysphoria   Meloxicam SWEATING   Novocain [Procaine Hcl]    Procaine    Prednisone Anxiety    Nervousness     PMHx:   Past Medical History:  Diagnosis Date   Arthritis    ASHD (arteriosclerotic  heart disease)    Atrial fib/flutter, transient    off coumadin since 11/2013- CHADSVAC of 2   Cancer ([place])    BASAL CELL    Colon polyps    2003 hyperplastic and tubuler adenoma   Depression    Diverticulosis    Dyslipidemia    Dysrhythmia    FHx: heart disease 08/11/2018   History of kidney stones    Hypertension    OSA (obstructive sleep apnea)    NO CPAP IN 10+ YEARS   Pre-diabetes    Rectus sheath hematoma 11/21/2013   Dr. 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     Immunization History  Administered Date(s) Administered   DT  07/06/2014   Influenza Split 07/20/2014, 07/18/2016   Influenza, High Dose  07/13/2021, 08/17/2022   Influenza 06/15/2018, 08/04/2019, 07/14/2020   PFIZER SARS-COV-2 Vacc 11/12/2019, 12/07/2019, 07/29/2020   Pfizer Covid-19 Vacc Bivalent  07/06/2021   Pneumococcal -13 07/06/2014   Pneumococcal P-23 06/09/2013   Td 12/15/2016   Zoster, Live 10/15/2005     Past Surgical History:  Procedure Laterality Date   CATARACT EXTRACTION, BILATERAL Bilateral 2019   at the [place] Right 03/07/2018    RIGHT TOTAL HIP ARTHROPLASTY DIRECT ANTERIOR; [name], MD    TRANSURETHRAL RESECTION OF PROSTATE  1980     FHx:    Reviewed / unchanged   SHx:    Reviewed / unchanged    Systems Review:  Constitutional: Denies fever, chills, wt changes, headaches, insomnia, fatigue, night sweats, change in appetite. Eyes: Denies redness, blurred vision, diplopia, discharge, itchy, watery eyes.  ENT: Denies discharge, congestion, post nasal drip, epistaxis, sore throat, earache, hearing loss, dental pain, tinnitus, vertigo, sinus pain, snoring.  CV: Denies chest pain, palpitations, irregular heartbeat, syncope,  dyspnea, diaphoresis, orthopnea, PND, claudication or edema. Respiratory: denies cough, dyspnea, DOE, pleurisy, hoarseness, laryngitis, wheezing.   Gastrointestinal: Denies dysphagia, odynophagia, heartburn, reflux, water brash, abdominal pain or cramps, nausea, vomiting, bloating, diarrhea, constipation, hematemesis, melena, hematochezia  or hemorrhoids. Genitourinary: Denies dysuria, frequency, urgency, nocturia, hesitancy, discharge, hematuria or flank pain. Musculoskeletal: Denies arthralgias, myalgias, stiffness, jt. swelling, pain, limping or strain/sprain.  Skin: Denies pruritus, rash, hives, warts, acne, eczema or change in skin lesion(s). Neuro: No weakness, tremor, incoordination, spasms, paresthesia or pain. Psychiatric: Denies confusion, memory loss or sensory loss. Endo: Denies change in weight, skin or hair change.  Heme/Lymph: No excessive bleeding, bruising or enlarged lymph nodes.   Physical Exam  BP 122/70   Pulse 65   Temp (!) 97 F (36.1 C)   Resp 12   Ht '5\' 10"'$  (1.778 m)   Wt 197 lb 12.8 oz (89.7 kg)   SpO2 99%   BMI 28.38 kg/m   Appears  well nourished, well groomed - appearing much younger than stated age and in no distress.  Eyes: PERRLA, EOMs, conjunctiva no swelling or erythema. Sinuses: No frontal/maxillary tenderness ENT/Mouth: EAC's clear, TM's nl w/o erythema, bulging. Nares clear w/o erythema, swelling, exudates. Oropharynx clear without erythema or exudates. Oral hygiene is good. Tongue normal, non obstructing. Hearing intact.  Neck: Supple. Thyroid not palpable. Car 2+/2+ without bruits, nodes or JVD. Chest: Respirations nl with BS clear & equal w/o rales, rhonchi, wheezing or stridor.  Cor: Heart sounds normal w/ regular rate and rhythm without sig. murmurs, gallops, clicks or rubs. Peripheral pulses normal and equal  without edema.  Abdomen: Soft & bowel sounds normal. Non-tender w/o guarding, rebound, hernias, masses or organomegaly.  Lymphatics: Unremarkable.  Musculoskeletal: Full ROM all peripheral extremities, joint stability, 5/5 strength and normal gait.  Skin: Warm, dry without  exposed rashes, lesions or ecchymosis apparent.  Neuro: Cranial nerves intact, reflexes equal bilaterally. Sensory-motor testing grossly intact. Tendon reflexes grossly intact.  Pysch: Alert & oriented x 3.  Insight and judgement nl & appropriate. No ideations.   Assessment and Plan:  1. Essential hypertension  - Continue medication, monitor blood pressure at home.  - Continue DASH diet.  Reminder to go to the ER if any CP,  SOB, nausea, dizziness, severe HA, changes vision/speech.    - CBC with Differential/Platelet - COMPLETE METABOLIC PANEL WITH GFR - Magnesium - TSH  2. Hyperlipidemia, mixed  - Continue diet/meds, exercise,& lifestyle modifications.  - Continue monitor periodic cholesterol/liver & renal functions     - Lipid panel - TSH  3. Abnormal glucose  - Continue diet, exercise  - Lifestyle modifications.  - Monitor appropriate labs   - Hemoglobin A1c - Insulin, random  4. Vitamin D deficiency  - Continue supplementation.   - VITAMIN D 25 Hydroxy   5. Medication management  - CBC with Differential/Platelet - COMPLETE METABOLIC PANEL WITH GFR - Magnesium - Lipid panel - TSH - Hemoglobin A1c - Insulin, random - VITAMIN D 25 Hydroxy           Discussed  regular exercise, BP monitoring, weight control to achieve/maintain BMI less than 25 and discussed med and SE's. Recommended labs to assess /monitor clinical status .  I discussed the assessment and treatment plan with the patient. The patient was provided an opportunity to ask questions and all were answered. The patient agreed with the plan and demonstrated an understanding of the instructions.  I provided over 30 minutes of exam, counseling, chart review  and  complex critical decision making.        The patient was advised to call back or seek an in-person evaluation if the symptoms worsen or if the condition fails to improve as anticipated.   Kirtland Bouchard, MD

## 2022-11-19 NOTE — Patient Instructions (Signed)

## 2022-11-20 LAB — CBC WITH DIFFERENTIAL/PLATELET
Absolute Monocytes: 600 cells/uL (ref 200–950)
Basophils Absolute: 28 cells/uL (ref 0–200)
Basophils Relative: 0.4 %
Eosinophils Absolute: 262 cells/uL (ref 15–500)
Eosinophils Relative: 3.8 %
HCT: 46.1 % (ref 38.5–50.0)
Hemoglobin: 16 g/dL (ref 13.2–17.1)
Lymphs Abs: 1925 cells/uL (ref 850–3900)
MCH: 32.3 pg (ref 27.0–33.0)
MCHC: 34.7 g/dL (ref 32.0–36.0)
MCV: 92.9 fL (ref 80.0–100.0)
MPV: 9.2 fL (ref 7.5–12.5)
Monocytes Relative: 8.7 %
Neutro Abs: 4085 cells/uL (ref 1500–7800)
Neutrophils Relative %: 59.2 %
Platelets: 217 10*3/uL (ref 140–400)
RBC: 4.96 10*6/uL (ref 4.20–5.80)
RDW: 12.8 % (ref 11.0–15.0)
Total Lymphocyte: 27.9 %
WBC: 6.9 10*3/uL (ref 3.8–10.8)

## 2022-11-20 LAB — COMPLETE METABOLIC PANEL WITH GFR
AG Ratio: 1.6 (calc) (ref 1.0–2.5)
ALT: 8 U/L — ABNORMAL LOW (ref 9–46)
AST: 17 U/L (ref 10–35)
Albumin: 3.9 g/dL (ref 3.6–5.1)
Alkaline phosphatase (APISO): 77 U/L (ref 35–144)
BUN: 11 mg/dL (ref 7–25)
CO2: 24 mmol/L (ref 20–32)
Calcium: 9.5 mg/dL (ref 8.6–10.3)
Chloride: 104 mmol/L (ref 98–110)
Creat: 0.83 mg/dL (ref 0.70–1.22)
Globulin: 2.5 g/dL (calc) (ref 1.9–3.7)
Glucose, Bld: 94 mg/dL (ref 65–99)
Potassium: 4.4 mmol/L (ref 3.5–5.3)
Sodium: 142 mmol/L (ref 135–146)
Total Bilirubin: 0.5 mg/dL (ref 0.2–1.2)
Total Protein: 6.4 g/dL (ref 6.1–8.1)
eGFR: 82 mL/min/{1.73_m2} (ref >=60)

## 2022-11-20 LAB — LIPID PANEL
Cholesterol: 198 mg/dL (ref <200)
HDL: 52 mg/dL (ref >=40)
LDL Cholesterol (Calc): 120 mg/dL (calc) — ABNORMAL HIGH
Non-HDL Cholesterol (Calc): 146 mg/dL (calc) — ABNORMAL HIGH (ref <130)
Total CHOL/HDL Ratio: 3.8 (calc) (ref <5.0)
Triglycerides: 143 mg/dL (ref <150)

## 2022-11-20 LAB — MAGNESIUM: Magnesium: 1.9 mg/dL (ref 1.5–2.5)

## 2022-11-20 LAB — HEMOGLOBIN A1C
Hgb A1c MFr Bld: 5.8 % of total Hgb — ABNORMAL HIGH (ref <5.7)
Mean Plasma Glucose: 120 mg/dL
eAG (mmol/L): 6.6 mmol/L

## 2022-11-20 LAB — INSULIN, RANDOM: Insulin: 7.3 u[IU]/mL

## 2022-11-20 LAB — TSH: TSH: 2.33 mIU/L (ref 0.40–4.50)

## 2022-11-20 LAB — VITAMIN D 25 HYDROXY (VIT D DEFICIENCY, FRACTURES): Vit D, 25-Hydroxy: 58 ng/mL (ref 30–100)

## 2022-11-20 NOTE — Progress Notes (Signed)
<><><><><><><><><><><><><><><><><><><><><><><><><><><><><><><><><> <><><><><><><><><><><><><><><><><><><><><><><><><><><><><><><><><> - Test results slightly outside the reference range are not unusual. If there is anything important, I will review this with you,  otherwise it is considered normal test values.  If you have further questions,  please do not hesitate to contact me at the office or via My Chart.  <><><><><><><><><><><><><><><><><><><><><><><><><><><><><><><><><> <><><><><><><><><><><><><><><><><><><><><><><><><><><><><><><><><>  -  Chol = 198 - elevated  ( Ideal or goal is less than 180  )  - And the bad LDL Chol = 120 - also too high - ( Ideal or goal is less than 70  !  )    - Treating with meds to lower Cholesterol is treating the result                                          & NOT treating the cause  - The cause is Bad Diet !   - Recommend a stricter plant based low cholesterol diet    - Cholesterol only comes from animal sources                                                                                     - ie. meat, dairy, egg yolks  - Eat all the vegetables you want.  - Avoid Meat, Avoid Meat , Avoid Meat  ! ! !                                                                     -especially red meat - Beef AND Pork  - Avoid cheese & dairy - milk & ice cream.    - Cheese is the most   concentrated form of trans-fats which                                                                     is the worst thing to clog up our arteries.    - Veggie cheese is OK which can be found in                                              the fresh produce section at                                                          [facility]  or [employer] or [employer] <><><><><><><><><><><><><><><><><><><><><><><><><><><><><><><><><> <><><><><><><><><><><><><><><><><><><><><><><><><><><><><><><><><>  -   As a last resort,  you can take medicines if you refuse to improve your diet    <><><><><><><><><><><><><><><><><><><><><><><><><><><><><><><><><> <><><><><><><><><><><><><><><><><><><><><><><><><><><><><><><><><>  -  A1c = 5.8% - slightly elevated blood sugar , So   - Avoid Sweets, Candy & White Stuff   - White Rice, White Potatoes, White Flour  - Breads &  Pasta <><><><><><><><><><><><><><><><><><><><><><><><><><><><><><><><><> <><><><><><><><><><><><><><><><><><><><><><><><><><><><><><><><><>  -  Vitamin  D = 58 - OK - Please keep dose same   <><><><><><><><><><><><><><><><><><><><><><><><><><><><><><><><><> <><><><><><><><><><><><><><><><><><><><><><><><><><><><><><><><><>  -  All Else - CBC - Kidneys - Electrolytes - Liver - Magnesium & Thyroid    - all  Normal / OK <><><><><><><><><><><><><><><><><><><><><><><><><><><><><><><><><> <><><><><><><><><><><><><><><><><><><><><><><><><><><><><><><><><>

## 2022-11-26 DIAGNOSIS — Z87891 Personal history of nicotine dependence: Secondary | ICD-10-CM | POA: Diagnosis not present

## 2022-11-26 DIAGNOSIS — J984 Other disorders of lung: Secondary | ICD-10-CM | POA: Diagnosis not present

## 2022-11-26 DIAGNOSIS — R918 Other nonspecific abnormal finding of lung field: Secondary | ICD-10-CM | POA: Diagnosis not present

## 2022-11-26 DIAGNOSIS — R0600 Dyspnea, unspecified: Secondary | ICD-10-CM | POA: Diagnosis not present

## 2022-11-26 DIAGNOSIS — R0602 Shortness of breath: Secondary | ICD-10-CM | POA: Diagnosis not present

## 2022-11-26 DIAGNOSIS — R062 Wheezing: Secondary | ICD-10-CM | POA: Diagnosis not present

## 2022-11-26 DIAGNOSIS — I4891 Unspecified atrial fibrillation: Secondary | ICD-10-CM | POA: Diagnosis not present

## 2022-11-26 DIAGNOSIS — J8410 Pulmonary fibrosis, unspecified: Secondary | ICD-10-CM | POA: Diagnosis not present

## 2022-11-27 DIAGNOSIS — R0602 Shortness of breath: Secondary | ICD-10-CM | POA: Diagnosis not present

## 2022-11-27 DIAGNOSIS — J8410 Pulmonary fibrosis, unspecified: Secondary | ICD-10-CM | POA: Diagnosis not present

## 2022-11-27 DIAGNOSIS — R0600 Dyspnea, unspecified: Secondary | ICD-10-CM | POA: Diagnosis not present

## 2022-11-27 DIAGNOSIS — I4891 Unspecified atrial fibrillation: Secondary | ICD-10-CM | POA: Diagnosis not present

## 2022-11-28 DIAGNOSIS — J8410 Pulmonary fibrosis, unspecified: Secondary | ICD-10-CM | POA: Diagnosis not present

## 2022-11-28 DIAGNOSIS — R0600 Dyspnea, unspecified: Secondary | ICD-10-CM | POA: Diagnosis not present

## 2022-11-28 DIAGNOSIS — R0602 Shortness of breath: Secondary | ICD-10-CM | POA: Diagnosis not present

## 2022-11-28 DIAGNOSIS — I4891 Unspecified atrial fibrillation: Secondary | ICD-10-CM | POA: Diagnosis not present

## 2022-11-29 DIAGNOSIS — R0600 Dyspnea, unspecified: Secondary | ICD-10-CM | POA: Diagnosis not present

## 2022-11-29 DIAGNOSIS — J984 Other disorders of lung: Secondary | ICD-10-CM | POA: Diagnosis not present

## 2022-11-29 DIAGNOSIS — J8410 Pulmonary fibrosis, unspecified: Secondary | ICD-10-CM | POA: Diagnosis not present

## 2022-11-29 DIAGNOSIS — I4891 Unspecified atrial fibrillation: Secondary | ICD-10-CM | POA: Diagnosis not present

## 2022-11-29 DIAGNOSIS — R0602 Shortness of breath: Secondary | ICD-10-CM | POA: Diagnosis not present

## 2022-11-30 DIAGNOSIS — J8410 Pulmonary fibrosis, unspecified: Secondary | ICD-10-CM | POA: Diagnosis not present

## 2022-11-30 DIAGNOSIS — R0600 Dyspnea, unspecified: Secondary | ICD-10-CM | POA: Diagnosis not present

## 2022-11-30 DIAGNOSIS — I4891 Unspecified atrial fibrillation: Secondary | ICD-10-CM | POA: Diagnosis not present

## 2022-11-30 DIAGNOSIS — J811 Chronic pulmonary edema: Secondary | ICD-10-CM | POA: Diagnosis not present

## 2022-11-30 DIAGNOSIS — R0602 Shortness of breath: Secondary | ICD-10-CM | POA: Diagnosis not present

## 2022-12-01 DIAGNOSIS — R0600 Dyspnea, unspecified: Secondary | ICD-10-CM | POA: Diagnosis not present

## 2022-12-01 DIAGNOSIS — I77819 Aortic ectasia, unspecified site: Secondary | ICD-10-CM | POA: Diagnosis not present

## 2022-12-01 DIAGNOSIS — I119 Hypertensive heart disease without heart failure: Secondary | ICD-10-CM | POA: Diagnosis not present

## 2022-12-01 DIAGNOSIS — I480 Paroxysmal atrial fibrillation: Secondary | ICD-10-CM | POA: Diagnosis not present

## 2022-12-01 DIAGNOSIS — J8410 Pulmonary fibrosis, unspecified: Secondary | ICD-10-CM | POA: Diagnosis not present

## 2022-12-01 DIAGNOSIS — I080 Rheumatic disorders of both mitral and aortic valves: Secondary | ICD-10-CM | POA: Diagnosis not present

## 2022-12-01 DIAGNOSIS — G4734 Idiopathic sleep related nonobstructive alveolar hypoventilation: Secondary | ICD-10-CM | POA: Diagnosis not present

## 2022-12-07 DIAGNOSIS — I77819 Aortic ectasia, unspecified site: Secondary | ICD-10-CM | POA: Diagnosis not present

## 2022-12-07 DIAGNOSIS — I480 Paroxysmal atrial fibrillation: Secondary | ICD-10-CM | POA: Diagnosis not present

## 2022-12-07 DIAGNOSIS — I119 Hypertensive heart disease without heart failure: Secondary | ICD-10-CM | POA: Diagnosis not present

## 2022-12-07 DIAGNOSIS — I080 Rheumatic disorders of both mitral and aortic valves: Secondary | ICD-10-CM | POA: Diagnosis not present

## 2022-12-07 DIAGNOSIS — J8410 Pulmonary fibrosis, unspecified: Secondary | ICD-10-CM | POA: Diagnosis not present

## 2022-12-13 DIAGNOSIS — J8410 Pulmonary fibrosis, unspecified: Secondary | ICD-10-CM | POA: Diagnosis not present

## 2022-12-13 DIAGNOSIS — I480 Paroxysmal atrial fibrillation: Secondary | ICD-10-CM | POA: Diagnosis not present

## 2022-12-13 DIAGNOSIS — I119 Hypertensive heart disease without heart failure: Secondary | ICD-10-CM | POA: Diagnosis not present

## 2022-12-13 DIAGNOSIS — I77819 Aortic ectasia, unspecified site: Secondary | ICD-10-CM | POA: Diagnosis not present

## 2022-12-13 DIAGNOSIS — I080 Rheumatic disorders of both mitral and aortic valves: Secondary | ICD-10-CM | POA: Diagnosis not present

## 2022-12-19 ENCOUNTER — Encounter: Payer: Self-pay | Admitting: Cardiovascular Disease

## 2022-12-19 DIAGNOSIS — I119 Hypertensive heart disease without heart failure: Secondary | ICD-10-CM | POA: Diagnosis not present

## 2022-12-19 DIAGNOSIS — I77819 Aortic ectasia, unspecified site: Secondary | ICD-10-CM | POA: Diagnosis not present

## 2022-12-19 DIAGNOSIS — J8410 Pulmonary fibrosis, unspecified: Secondary | ICD-10-CM | POA: Diagnosis not present

## 2022-12-19 DIAGNOSIS — I480 Paroxysmal atrial fibrillation: Secondary | ICD-10-CM | POA: Diagnosis not present

## 2022-12-19 DIAGNOSIS — I080 Rheumatic disorders of both mitral and aortic valves: Secondary | ICD-10-CM | POA: Diagnosis not present

## 2022-12-20 NOTE — Telephone Encounter (Signed)
Left voicemail message to call back for review of information.

## 2022-12-20 NOTE — Telephone Encounter (Signed)
Left voicemail message to call back  

## 2022-12-25 DIAGNOSIS — I480 Paroxysmal atrial fibrillation: Secondary | ICD-10-CM | POA: Diagnosis not present

## 2022-12-25 DIAGNOSIS — J8410 Pulmonary fibrosis, unspecified: Secondary | ICD-10-CM | POA: Diagnosis not present

## 2022-12-25 DIAGNOSIS — I119 Hypertensive heart disease without heart failure: Secondary | ICD-10-CM | POA: Diagnosis not present

## 2022-12-25 DIAGNOSIS — I080 Rheumatic disorders of both mitral and aortic valves: Secondary | ICD-10-CM | POA: Diagnosis not present

## 2022-12-25 DIAGNOSIS — I77819 Aortic ectasia, unspecified site: Secondary | ICD-10-CM | POA: Diagnosis not present

## 2022-12-27 DIAGNOSIS — I119 Hypertensive heart disease without heart failure: Secondary | ICD-10-CM | POA: Diagnosis not present

## 2022-12-27 DIAGNOSIS — J8410 Pulmonary fibrosis, unspecified: Secondary | ICD-10-CM | POA: Diagnosis not present

## 2022-12-27 DIAGNOSIS — I080 Rheumatic disorders of both mitral and aortic valves: Secondary | ICD-10-CM | POA: Diagnosis not present

## 2022-12-27 DIAGNOSIS — I480 Paroxysmal atrial fibrillation: Secondary | ICD-10-CM | POA: Diagnosis not present

## 2022-12-27 DIAGNOSIS — I77819 Aortic ectasia, unspecified site: Secondary | ICD-10-CM | POA: Diagnosis not present

## 2022-12-30 DIAGNOSIS — R0600 Dyspnea, unspecified: Secondary | ICD-10-CM | POA: Diagnosis not present

## 2022-12-30 DIAGNOSIS — G4734 Idiopathic sleep related nonobstructive alveolar hypoventilation: Secondary | ICD-10-CM | POA: Diagnosis not present

## 2023-01-30 DIAGNOSIS — R0600 Dyspnea, unspecified: Secondary | ICD-10-CM | POA: Diagnosis not present

## 2023-01-30 DIAGNOSIS — G4734 Idiopathic sleep related nonobstructive alveolar hypoventilation: Secondary | ICD-10-CM | POA: Diagnosis not present

## 2023-02-15 DIAGNOSIS — R531 Weakness: Secondary | ICD-10-CM | POA: Diagnosis not present

## 2023-02-15 DIAGNOSIS — J00 Acute nasopharyngitis [common cold]: Secondary | ICD-10-CM | POA: Diagnosis not present

## 2023-03-07 ENCOUNTER — Encounter: Payer: Self-pay | Admitting: Internal Medicine

## 2023-03-07 NOTE — Progress Notes (Signed)
C  A N  C  E L  L   E  D   (  N O    S  H O  W   )                                                                                                                                                                                                                                                                                                                      Has 8/28  3 mo WELLNESS - Tonya  NEEDS 6 mo OV - Torianna Junio  Needs 9 mo OV Tonya  NEEDS 1 year  CPE - Mariangela Heldt        Annual  Screening/Preventative Visit &  Comprehensive Evaluation & Examination   Future Appointments  Date Time Provider Department  03/08/2023                     cpe 11:00 AM Lucky Cowboy, MD [facility]  06/12/2023                    wellness  2:00 PM Adela Glimpse, NP [facility]            This very nice 89 y.o. MWM  with HTN, HLD, Prediabetes and Vitamin D Deficiency  presents for a Screening /Preventative Visit & comprehensive evaluation and management of multiple medical co-morbidities.   Patient also has hx/o Major Depression in remission on current meds.                                                         In 2023, patient  was dx'd with Prostate Ca  at the [facility] and had a neg Abd/Pelvic CT /MRI scan  &  is  being  followed  by  Active Surveillance.                                                                                                                                                      Patient has hx/o HTN  since 2005. Patient's BP has been controlled on Propranolol taken concomitantly for Migraine prophylaxis.  Patient also has CKD2  (GFR 79) . Patient's BP has been controlled  and today's BP is at goal -                                .  Patient was dx'd in 2007 with pAfib & started on Coumadin & then stopped when he developed a large spontaneous rectus sheath hematoma.  Heart cath in 2003 was Negative as were 2 negative Cardiolite's in 2005 & 2007. Patient denies any cardiac symptoms as chest pain, palpitations, shortness of breath, dizziness or ankle swelling.       Patient's hyperlipidemia is  not controlled with diet.  Last lipids were not at goal  & with elevated LDL 120  :  Lab Results  Component Value Date   CHOL 198 11/19/2022   HDL 52 11/19/2022   LDLCALC 120 (H) 11/19/2022   TRIG 143 11/19/2022   CHOLHDL 3.8 11/19/2022                                                       Patient has hx/o  prediabetes (A1c 5.7% /2014) and patient denies reactive hypoglycemic symptoms, visual blurring, diabetic polys or paresthesias. Last A1c was not at goal:   Lab Results  Component Value Date   HGBA1C 5.8 (H) 11/19/2022          Finally, patient has history of Vitamin D Deficiency ("33" /2008) and last vitamin D was near goal :   Lab Results  Component Value Date   VD25OH 58 11/19/2022       Current Outpatient Medications on File Prior to Visit  Medication Sig   acetaminophen (TYLENOL) 325 MG tablet Take 325 mgm as needed for moderate pain.   ammonium lactate (LAC-HYDRIN) 12 % lotion Apply 1 application topically as needed for dry skin.   aspirin EC 81 MG tablet Take  3  times a week - Mon,Wed & Fri    bacitracin 500 UNIT/GM ointment Apply 1 application topically as needed for wound care.   Calcium Antacid (TUMS E-X PO) Take 1 tablet bin the morning and at bedtime.   carboxymethylcellulose 1 % ophth soln Apply 1 drop to eye at bedtime.   Carboxymethylcellulose Sod PF 0.5 % SOLN Apply  1 drop to eye in the morning, at noon, in the evening, and at bedtime.   VITAMIN D  1000 u Take 1,000 Units  daily.   citalopram (CELEXA) 20 MG tablet Take  1 tablet  Daily   for Mood   clotrimazole (LOTRIMIN) 1 % cream Apply 2  times daily. Toe fungus   diltiazem (CARDIZEM) 30 MG tablet Take 1 tablet 2  times daily.   finasteride (PROSCAR) 5 MG tablet TAKE 1 TABLET   DAILY FOR PROSTATE   FLONASE  nasal spray Place 1 spray into both nostrils as directed.   NORCO/VICODIN 5-325 MG  Take 0.5 tablets  in the morning and at bedtime.   ketoconazole 2 % shampoo APPLY SHAMPOO TOPICALLY  TWICE WEEKLY    loratadine  10 MG tablet Take  daily as needed for allergies.   magic mouthwash (nystatin, lidocaine, diphenhydrAMINE) suspension Take 5 mLs  as needed for mouth pain. Includes Dexamethasone as well   Magnesium 250 MG TABS Take 1 tablet at bedtime.   meclizine  25 MG tablet Take  as needed for dizziness.   mirtazapine (REMERON) 7.5 MG tablet Take at bedtime.   EYE MULTIVITAMIN/LUTEIN  Take 1 tablet  in the morning and at bedtime.   polyethylene glycol powder  17 GM Take 1 Container  as needed for moderate constipation.   SENOKOT-S   Take 2 tablets at bedtime as needed    tamsulosin  0.4 MG CAPS capsule TAKE 1 CAPSULE  AT  BEDTIME    vitamin C 500 MG tablet Take daily.     Allergies  Allergen Reactions   Gabapentin Confusion   Ciprofloxacin dysphoria   Meloxicam SWEATING   Novocain [Procaine Hcl]    Procaine    Prednisone Anxiety    Nervousness     Past Medical History:  Diagnosis Date   Arthritis    ASHD (arteriosclerotic heart disease)    Atrial fib/flutter, transient    off coumadin since 11/2013- CHADSVAC of 2   Cancer (HCC)    BASAL CELL    Colon polyps    2003 hyperplastic and tubuler adenoma   Depression    Diverticulosis    Dyslipidemia    Dysrhythmia    FHx: heart disease 08/11/2018   History of kidney stones    Hypertension    OSA (obstructive sleep apnea)    NO CPAP IN 10+ YEARS   Pre-diabetes    Rectus sheath hematoma 11/21/2013   Dr. 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     Health Maintenance  Topic Date  Due   INFLUENZA VACCINE  05/15/2022   TETANUS/TDAP  09/30/2031   Pneumonia Vaccine 61+ Years old  Completed   COVID-19 Vaccine  Completed   Zoster Vaccines- Shingrix  Completed   HPV VACCINES  Aged Out     Immunization History  Administered Date(s) Administered   DT  07/06/2014   Influenza Split 07/20/2014, 07/18/2016   Influenza, High Dose  08/22/2015, 07/15/2017, 07/13/2021   Influenza 06/15/2018, 08/04/2019, 07/14/2020   PFIZER-SARS-COV-2 Vacc 11/12/2019, 12/07/2019, 07/29/2020   Pfizer Covid-19 Bivalent Booster  07/06/2021   Pneumococcal -13 07/06/2014   Pneumococcal -23 06/09/2013   Td 12/15/2016   Zoster, Live 10/15/2005    Last Colon - 03/11/2002 - Dr Arlyce Dice recc f/u 10 year - has since aged-out.  Past Surgical History:  Procedure Laterality Date   CATARACT EXTRACTION, BILATERAL Bilateral 2019   at the [facility]  HERNIA REPAIR     PROSTATE     TOTAL HIP ARTHROPLASTY Right 03/07/2018   Procedure: RIGHT TOTAL HIP ARTHROPLASTY DIRECT ANTERIOR;  Surgeon: Eldred Manges, MD;  Location: [facility] OR;  Service: Orthopedics;  Laterality: Right;   TRANSURETHRAL RESECTION OF PROSTATE  1980     Family History  Problem Relation Age of Onset   Alzheimer's disease Mother    Kidney cancer Father    Diabetes Brother    Breast cancer Sister    Rectal cancer Sister    Breast cancer Daughter    Ulcerative colitis Sister    Heart disease Brother    Heart disease Sister      Social History   Tobacco Use   Smoking status: Former    Types: Pipe    Quit date: 09/14/1993    Years since quitting: 28.4   Smokeless tobacco: Never  Vaping Use   Vaping Use: Never used  Substance Use Topics   Alcohol use: No   Drug use: No      ROS Constitutional: Denies fever, chills, weight loss/gain, headaches, insomnia,  night sweats or change in appetite. Does c/o fatigue. Eyes: Denies redness, blurred vision, diplopia, discharge, itchy or watery eyes.  ENT: Denies discharge,  congestion, post nasal drip, epistaxis, sore throat, earache, hearing loss, dental pain, Tinnitus, Vertigo, Sinus pain or snoring.  Cardio: Denies chest pain, palpitations, irregular heartbeat, syncope, dyspnea, diaphoresis, orthopnea, PND, claudication or edema Respiratory: denies cough, dyspnea, DOE, pleurisy, hoarseness, laryngitis or wheezing.  Gastrointestinal: Denies dysphagia, heartburn, reflux, water brash, pain, cramps, nausea, vomiting, bloating, diarrhea, constipation, hematemesis, melena, hematochezia, jaundice or hemorrhoids Genitourinary: Denies dysuria, frequency, urgency, nocturia, hesitancy, discharge, hematuria or flank pain Musculoskeletal: Denies arthralgia, myalgia, stiffness, Jt. Swelling, pain, limp or strain/sprain. Denies Falls. Skin: Denies puritis, rash, hives, warts, acne, eczema or change in skin lesion Neuro: No weakness, tremor, incoordination, spasms, paresthesia or pain Psychiatric: Denies confusion, memory loss or sensory loss. Denies Depression. Endocrine: Denies change in weight, skin, hair change, nocturia, and paresthesia, diabetic polys, visual blurring or hyper / hypo glycemic episodes.  Heme/Lymph: No excessive bleeding, bruising or enlarged lymph nodes.   Physical Exam  There were no vitals taken for this visit.  General Appearance: Well nourished and well groomed and in no apparent distress.  Eyes: PERRLA, EOMs, conjunctiva no swelling or erythema, normal fundi and vessels. Sinuses: No frontal/maxillary tenderness ENT/Mouth: EACs patent / TMs  nl. Nares clear without erythema, swelling, mucoid exudates. Oral hygiene is good. No erythema, swelling, or exudate. Tongue normal, non-obstructing. Tonsils not swollen or erythematous. Hearing normal.  Neck: Supple, thyroid not palpable. No bruits, nodes or JVD. Respiratory: Respiratory effort normal.  BS equal and clear bilateral without rales, rhonci, wheezing or stridor. Cardio: Heart sounds are normal  with regular rate and rhythm and no murmurs, rubs or gallops. Peripheral pulses are normal and equal bilaterally without edema. No aortic or femoral bruits. Chest: symmetric with normal excursions and percussion.  Abdomen: Soft, with Nl bowel sounds. Nontender, no guarding, rebound, hernias, masses, or organomegaly.  Lymphatics: Non tender without lymphadenopathy.  Musculoskeletal: Full ROM all peripheral extremities, joint stability, 5/5 strength, and normal gait. Skin: Warm and dry without rashes, lesions, cyanosis, clubbing or  ecchymosis.  Neuro: Cranial nerves intact, reflexes equal bilaterally. Normal muscle tone, no cerebellar symptoms. Sensation intact.  Pysch: Alert and oriented X 3 with normal affect, insight and judgment appropriate.   Assessment and Plan  1. Annual Preventative/Screening Exam    2.  Essential hypertension  - EKG 12-Lead - [place], RETROPERITNL ABD,  LTD - CBC with Differential/Platelet - COMPLETE METABOLIC PANEL WITH GFR - Magnesium - TSH  3. Paroxysmal atrial fibrillation (HCC)  - EKG 12-Lead - [place], RETROPERITNL ABD,  LTD - Lipid panel - TSH  4. Hyperlipidemia, mixed  - EKG 12-Lead - [place], RETROPERITNL ABD,  LTD - Lipid panel - TSH  5. ASHD (arteriosclerotic heart disease)  - EKG 12-Lead - [place], RETROPERITNL ABD,  LTD  6. Prostate cancer (HCC)   7. Abnormal glucose  - EKG 12-Lead - [place], RETROPERITNL ABD,  LTD - Hemoglobin A1c - Insulin, random  8. Vitamin D deficiency  - VITAMIN D 25 Hydroxy   9. Depression, major, in remission (HCC)  - TSH  10. Screening for colorectal cancer  - POC Hemoccult Bld/Stl   11. Screening for heart disease  - EKG 12-Lead  12. FHx: heart disease  - EKG 12-Lead - [place], RETROPERITNL ABD,  LTD  13. Former smoker  - EKG 12-Lead - [place], RETROPERITNL ABD,  LTD  14. Screening for AAA (aortic abdominal aneurysm)  - [place], RETROPERITNL ABD,  LTD  15. Senile purpura (HCC)  - CBC with  Differential/Platelet  16. Medication management  - Urinalysis, Routine w reflex microscopic - Microalbumin / creatinine urine ratio - CBC with Differential/Platelet - COMPLETE METABOLIC PANEL WITH GFR - Magnesium - Lipid panel - TSH - Hemoglobin A1c - Insulin, random - VITAMIN D 25 Hydroxy           Patient was counseled in prudent diet, weight control to achieve/maintain BMI less than 25, BP monitoring, regular exercise and medications as discussed.  Discussed med effects and SE's. Routine screening labs and tests as requested with regular follow-up as recommended. Over 40 minutes of exam, counseling, chart review and high complex critical decision making was performed   Marinus Maw, MD

## 2023-03-08 ENCOUNTER — Ambulatory Visit: Payer: Medicare Other | Admitting: Internal Medicine

## 2023-03-08 DIAGNOSIS — F325 Major depressive disorder, single episode, in full remission: Secondary | ICD-10-CM

## 2023-03-08 DIAGNOSIS — I10 Essential (primary) hypertension: Secondary | ICD-10-CM

## 2023-03-08 DIAGNOSIS — E559 Vitamin D deficiency, unspecified: Secondary | ICD-10-CM

## 2023-03-08 DIAGNOSIS — Z0001 Encounter for general adult medical examination with abnormal findings: Secondary | ICD-10-CM

## 2023-03-08 DIAGNOSIS — Z136 Encounter for screening for cardiovascular disorders: Secondary | ICD-10-CM

## 2023-03-08 DIAGNOSIS — R7309 Other abnormal glucose: Secondary | ICD-10-CM

## 2023-03-08 DIAGNOSIS — E782 Mixed hyperlipidemia: Secondary | ICD-10-CM

## 2023-03-08 DIAGNOSIS — C61 Malignant neoplasm of prostate: Secondary | ICD-10-CM

## 2023-03-08 DIAGNOSIS — Z1211 Encounter for screening for malignant neoplasm of colon: Secondary | ICD-10-CM

## 2023-03-08 DIAGNOSIS — I480 Paroxysmal atrial fibrillation: Secondary | ICD-10-CM

## 2023-03-08 DIAGNOSIS — I2510 Atherosclerotic heart disease of native coronary artery without angina pectoris: Secondary | ICD-10-CM

## 2023-03-08 DIAGNOSIS — Z87891 Personal history of nicotine dependence: Secondary | ICD-10-CM

## 2023-03-08 DIAGNOSIS — Z79899 Other long term (current) drug therapy: Secondary | ICD-10-CM

## 2023-03-08 DIAGNOSIS — Z8249 Family history of ischemic heart disease and other diseases of the circulatory system: Secondary | ICD-10-CM

## 2023-03-08 DIAGNOSIS — D692 Other nonthrombocytopenic purpura: Secondary | ICD-10-CM

## 2023-06-12 ENCOUNTER — Ambulatory Visit: Payer: Medicare Other | Admitting: Nurse Practitioner

## 2023-06-17 ENCOUNTER — Encounter: Payer: Self-pay | Admitting: Internal Medicine

## 2023-06-17 NOTE — Progress Notes (Unsigned)
Annual  Screening/Preventative Visit &  Comprehensive Evaluation & Examination   Future Appointments  Date Time Provider Department  06/18/2023                        cpe  2:00 PM Lucky Cowboy, MD [facility]  06/25/2024                       cpe  2:00 PM Lucky Cowboy, MD [facility]          This very nice 89 y.o. MWM  with HTN, HLD, Prediabetes and Vitamin D Deficiency  presents for a Screening /Preventative Visit & comprehensive evaluation and management of multiple medical co-morbidities.   Patient also has hx/o Major Depression in remission on current meds.                                                         In 2023, patient  was dx'd with Prostate Ca  at the [facility] and had a neg Abd/Pelvic CT /MRI scan  &  is  being  followed  by  Active Surveillance .                                                                                                                                                     Patient has hx/o HTN  since 2005. Patient's BP has been controlled on Propranolol taken concomitantly for Migraine prophylaxis.  Patient also has CKD2  (GFR 79) . Patient's BP has been controlled and today's BP is at goal -                                .  Patient was dx'd in 2007 with pAfib & started on Coumadin & then stopped when he developed a large spontaneous rectus sheath hematoma.  Heart cath in 2003 was Negative as were 2 negative Cardiolite's in 2005 & 2007. Patient denies any cardiac symptoms as chest pain, palpitations, shortness of breath, dizziness or ankle swelling.        Patient's hyperlipidemia is  not controlled with diet.  Last lipids were not at goal  & with elevated LDL 120  :  Lab Results  Component Value Date   CHOL 198 11/19/2022   HDL 52 11/19/2022   LDLCALC 120 (H) 11/19/2022   TRIG 143 11/19/2022   CHOLHDL 3.8 11/19/2022  Patient has hx/o  prediabetes (A1c 5.7% /2014) and patient denies  reactive hypoglycemic symptoms, visual blurring, diabetic polys or paresthesias. Last A1c was not at goal:   Lab Results  Component Value Date   HGBA1C 5.8 (H) 11/19/2022         Finally, patient has history of Vitamin D Deficiency ("33" /2008) and last vitamin D was near goal :   Lab Results  Component Value Date   VD25OH 58 11/19/2022       Current Outpatient Medications on File Prior to Visit  Medication Sig   acetaminophen (TYLENOL) 325 MG tablet Take 325 mgm as needed for moderate pain.   ammonium lactate (LAC-HYDRIN) 12 % lotion Apply 1 application topically as needed for dry skin.   aspirin EC 81 MG tablet Take  3  times a week - Mon,Wed & Fri    bacitracin 500 UNIT/GM ointment Apply 1 application topically as needed for wound care.   Calcium Antacid (TUMS E-X PO) Take 1 tablet bin the morning and at bedtime.   carboxymethylcellulose 1 % ophth soln Apply 1 drop to eye at bedtime.   Carboxymethylcellulose Sod PF 0.5 % SOLN Apply 1 drop to eye in the morning, at noon, in the evening, and at bedtime.   VITAMIN D  1000 u Take 1,000 Units  daily.   citalopram (CELEXA) 20 MG tablet Take  1 tablet  Daily  for Mood   clotrimazole (LOTRIMIN) 1 % cream Apply 2  times daily. Toe fungus   diltiazem (CARDIZEM) 30 MG tablet Take 1 tablet 2  times daily.   finasteride (PROSCAR) 5 MG tablet TAKE 1 TABLET   DAILY FOR PROSTATE   FLONASE  nasal spray Place 1 spray into both nostrils as directed.   NORCO/VICODIN 5-325 MG  Take 0.5 tablets  in the morning and at bedtime.   ketoconazole 2 % shampoo APPLY SHAMPOO TOPICALLY  TWICE WEEKLY    loratadine  10 MG tablet Take  daily as needed for allergies.   magic mouthwash (nystatin, lidocaine, diphenhydrAMINE) suspension Take 5 mLs  as needed for mouth pain. Includes Dexamethasone as well   Magnesium 250 MG TABS Take 1 tablet at bedtime.   meclizine  25 MG tablet Take  as needed for dizziness.   mirtazapine (REMERON) 7.5 MG tablet Take at bedtime.    EYE MULTIVITAMIN/LUTEIN  Take 1 tablet  in the morning and at bedtime.   polyethylene glycol powder  17 GM Take 1 Container  as needed for moderate constipation.   SENOKOT-S   Take 2 tablets at bedtime as needed    tamsulosin  0.4 MG CAPS capsule TAKE 1 CAPSULE  AT  BEDTIME    vitamin C 500 MG tablet Take daily.     Allergies  Allergen Reactions   Gabapentin Confusion   Ciprofloxacin dysphoria   Meloxicam SWEATING   Novocain [Procaine Hcl]    Procaine    Prednisone Anxiety    Nervousness     Past Medical History:  Diagnosis Date   Arthritis    ASHD (arteriosclerotic heart disease)    Atrial fib/flutter, transient    off coumadin since 11/2013- CHADSVAC of 2   Cancer (HCC)    BASAL CELL    Colon polyps    2003 hyperplastic and tubuler adenoma   Depression    Diverticulosis    Dyslipidemia    Dysrhythmia    FHx: heart disease 08/11/2018   History of kidney stones    Hypertension  OSA (obstructive sleep apnea)    NO CPAP IN 10+ YEARS   Pre-diabetes    Rectus sheath hematoma 11/21/2013   Dr. 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     Health Maintenance  Topic Date Due   INFLUENZA VACCINE  05/15/2022   TETANUS/TDAP  09/30/2031   Pneumonia Vaccine 64+ Years old  Completed   COVID-19 Vaccine  Completed   Zoster Vaccines- Shingrix  Completed   HPV VACCINES  Aged Out     Immunization History  Administered Date(s) Administered   DT  07/06/2014   Influenza Split 07/20/2014, 07/18/2016   Influenza, High Dose  08/22/2015, 07/15/2017, 07/13/2021   Influenza 06/15/2018, 08/04/2019, 07/14/2020   PFIZER-SARS-COV-2 Vacc 11/12/2019, 12/07/2019, 07/29/2020   Pfizer Covid-19 Bivalent Booster  07/06/2021   Pneumococcal -13 07/06/2014   Pneumococcal -23 06/09/2013   Td 12/15/2016   Zoster, Live 10/15/2005    Last Colon - 03/11/2002 - Dr Arlyce Dice recc f/u 10 year - has since aged-out.  Past Surgical History:  Procedure Laterality Date    CATARACT EXTRACTION, BILATERAL Bilateral 2019   at the [facility]   HERNIA REPAIR     PROSTATE     TOTAL HIP ARTHROPLASTY Right 03/07/2018   Procedure: RIGHT TOTAL HIP ARTHROPLASTY DIRECT ANTERIOR;  Surgeon: Eldred Manges, MD;  Location: [facility] OR;  Service: Orthopedics;  Laterality: Right;   TRANSURETHRAL RESECTION OF PROSTATE  1980     Family History  Problem Relation Age of Onset   Alzheimer's disease Mother    Kidney cancer Father    Diabetes Brother    Breast cancer Sister    Rectal cancer Sister    Breast cancer Daughter    Ulcerative colitis Sister    Heart disease Brother    Heart disease Sister      Social History   Tobacco Use   Smoking status: Former    Types: Pipe    Quit date: 09/14/1993    Years since quitting: 28.4   Smokeless tobacco: Never  Vaping Use   Vaping Use: Never used  Substance Use Topics   Alcohol use: No   Drug use: No      ROS Constitutional: Denies fever, chills, weight loss/gain, headaches, insomnia,  night sweats or change in appetite. Does c/o fatigue. Eyes: Denies redness, blurred vision, diplopia, discharge, itchy or watery eyes.  ENT: Denies discharge, congestion, post nasal drip, epistaxis, sore throat, earache, hearing loss, dental pain, Tinnitus, Vertigo, Sinus pain or snoring.  Cardio: Denies chest pain, palpitations, irregular heartbeat, syncope, dyspnea, diaphoresis, orthopnea, PND, claudication or edema Respiratory: denies cough, dyspnea, DOE, pleurisy, hoarseness, laryngitis or wheezing.  Gastrointestinal: Denies dysphagia, heartburn, reflux, water brash, pain, cramps, nausea, vomiting, bloating, diarrhea, constipation, hematemesis, melena, hematochezia, jaundice or hemorrhoids Genitourinary: Denies dysuria, frequency, urgency, nocturia, hesitancy, discharge, hematuria or flank pain Musculoskeletal: Denies arthralgia, myalgia, stiffness, Jt. Swelling, pain, limp or strain/sprain. Denies Falls. Skin: Denies puritis,  rash, hives, warts, acne, eczema or change in skin lesion Neuro: No weakness, tremor, incoordination, spasms, paresthesia or pain Psychiatric: Denies confusion, memory loss or sensory loss. Denies Depression. Endocrine: Denies change in weight, skin, hair change, nocturia, and paresthesia, diabetic polys, visual blurring or hyper / hypo glycemic episodes.  Heme/Lymph: No excessive bleeding, bruising or enlarged lymph nodes.   Physical Exam  There were no vitals taken for this visit.  General Appearance: Well nourished and well groomed and in no apparent distress.  Eyes: PERRLA, EOMs, conjunctiva  no swelling or erythema, normal fundi and vessels. Sinuses: No frontal/maxillary tenderness ENT/Mouth: EACs patent / TMs  nl. Nares clear without erythema, swelling, mucoid exudates. Oral hygiene is good. No erythema, swelling, or exudate. Tongue normal, non-obstructing. Tonsils not swollen or erythematous. Hearing normal.  Neck: Supple, thyroid not palpable. No bruits, nodes or JVD. Respiratory: Respiratory effort normal.  BS equal and clear bilateral without rales, rhonci, wheezing or stridor. Cardio: Heart sounds are normal with regular rate and rhythm and no murmurs, rubs or gallops. Peripheral pulses are normal and equal bilaterally without edema. No aortic or femoral bruits. Chest: symmetric with normal excursions and percussion.  Abdomen: Soft, with Nl bowel sounds. Nontender, no guarding, rebound, hernias, masses, or organomegaly.  Lymphatics: Non tender without lymphadenopathy.  Musculoskeletal: Full ROM all peripheral extremities, joint stability, 5/5 strength, and normal gait. Skin: Warm and dry without rashes, lesions, cyanosis, clubbing or  ecchymosis.  Neuro: Cranial nerves intact, reflexes equal bilaterally. Normal muscle tone, no cerebellar symptoms. Sensation intact.  Pysch: Alert and oriented X 3 with normal affect, insight and judgment appropriate.   Assessment and Plan  1.  Annual Preventative/Screening Exam    2. Essential hypertension  - EKG 12-Lead - [place], RETROPERITNL ABD,  LTD - CBC with Differential/Platelet - COMPLETE METABOLIC PANEL WITH GFR - Magnesium - TSH  3. Paroxysmal atrial fibrillation (HCC)  - EKG 12-Lead - [place], RETROPERITNL ABD,  LTD - Lipid panel - TSH  4. Hyperlipidemia, mixed  - EKG 12-Lead - [place], RETROPERITNL ABD,  LTD - Lipid panel - TSH  5. ASHD (arteriosclerotic heart disease)  - EKG 12-Lead - [place], RETROPERITNL ABD,  LTD  6. Prostate cancer (HCC)   7. Abnormal glucose  - EKG 12-Lead - [place], RETROPERITNL ABD,  LTD - Hemoglobin A1c - Insulin, random  8. Vitamin D deficiency  - VITAMIN D 25 Hydroxy   9. Depression, major, in remission (HCC)  - TSH  10. Screening for colorectal cancer  - POC Hemoccult Bld/Stl   11. Screening for heart disease  - EKG 12-Lead  12. FHx: heart disease  - EKG 12-Lead - [place], RETROPERITNL ABD,  LTD  13. Former smoker  - EKG 12-Lead - [place], RETROPERITNL ABD,  LTD  14. Screening for AAA (aortic abdominal aneurysm)  - [place], RETROPERITNL ABD,  LTD  15. Senile purpura (HCC)  - CBC with Differential/Platelet  16. Medication management  - Urinalysis, Routine w reflex microscopic - Microalbumin / creatinine urine ratio - CBC with Differential/Platelet - COMPLETE METABOLIC PANEL WITH GFR - Magnesium - Lipid panel - TSH - Hemoglobin A1c - Insulin, random - VITAMIN D 25 Hydroxy           Patient was counseled in prudent diet, weight control to achieve/maintain BMI less than 25, BP monitoring, regular exercise and medications as discussed.  Discussed med effects and SE's. Routine screening labs and tests as requested with regular follow-up as recommended. Over 40 minutes of exam, counseling, chart review and high complex critical decision making was performed   Marinus Maw, MD

## 2023-06-17 NOTE — Patient Instructions (Signed)

## 2023-06-18 ENCOUNTER — Encounter: Payer: Self-pay | Admitting: Internal Medicine

## 2023-06-18 ENCOUNTER — Ambulatory Visit (INDEPENDENT_AMBULATORY_CARE_PROVIDER_SITE_OTHER): Payer: Medicare Other | Admitting: Internal Medicine

## 2023-06-18 VITALS — BP 120/80 | HR 71 | Temp 97.9°F | Ht 67.0 in | Wt 197.0 lb

## 2023-06-18 DIAGNOSIS — I10 Essential (primary) hypertension: Secondary | ICD-10-CM

## 2023-06-18 DIAGNOSIS — I2510 Atherosclerotic heart disease of native coronary artery without angina pectoris: Secondary | ICD-10-CM

## 2023-06-18 DIAGNOSIS — Z136 Encounter for screening for cardiovascular disorders: Secondary | ICD-10-CM | POA: Diagnosis not present

## 2023-06-18 DIAGNOSIS — Z0000 Encounter for general adult medical examination without abnormal findings: Secondary | ICD-10-CM

## 2023-06-18 DIAGNOSIS — Z8249 Family history of ischemic heart disease and other diseases of the circulatory system: Secondary | ICD-10-CM

## 2023-06-18 DIAGNOSIS — Z79899 Other long term (current) drug therapy: Secondary | ICD-10-CM

## 2023-06-18 DIAGNOSIS — E559 Vitamin D deficiency, unspecified: Secondary | ICD-10-CM

## 2023-06-18 DIAGNOSIS — Z1211 Encounter for screening for malignant neoplasm of colon: Secondary | ICD-10-CM

## 2023-06-18 DIAGNOSIS — I480 Paroxysmal atrial fibrillation: Secondary | ICD-10-CM | POA: Diagnosis not present

## 2023-06-18 DIAGNOSIS — R7309 Other abnormal glucose: Secondary | ICD-10-CM | POA: Diagnosis not present

## 2023-06-18 DIAGNOSIS — Z0001 Encounter for general adult medical examination with abnormal findings: Secondary | ICD-10-CM

## 2023-06-18 DIAGNOSIS — I700 Atherosclerosis of aorta: Secondary | ICD-10-CM

## 2023-06-18 DIAGNOSIS — C61 Malignant neoplasm of prostate: Secondary | ICD-10-CM

## 2023-06-18 DIAGNOSIS — F325 Major depressive disorder, single episode, in full remission: Secondary | ICD-10-CM

## 2023-06-18 DIAGNOSIS — E782 Mixed hyperlipidemia: Secondary | ICD-10-CM

## 2023-06-18 DIAGNOSIS — D692 Other nonthrombocytopenic purpura: Secondary | ICD-10-CM

## 2023-06-18 DIAGNOSIS — M5432 Sciatica, left side: Secondary | ICD-10-CM

## 2023-06-18 DIAGNOSIS — Z87891 Personal history of nicotine dependence: Secondary | ICD-10-CM

## 2023-06-18 MED ORDER — DEXAMETHASONE 4 MG PO TABS
ORAL_TABLET | ORAL | 0 refills | Status: DC
Start: 2023-06-18 — End: 2023-09-30

## 2023-06-18 MED ORDER — MELOXICAM 15 MG PO TABS
ORAL_TABLET | ORAL | 3 refills | Status: DC
Start: 2023-06-18 — End: 2024-05-19

## 2023-06-19 LAB — URINALYSIS, ROUTINE W REFLEX MICROSCOPIC
Bacteria, UA: NONE SEEN /HPF
Bilirubin Urine: NEGATIVE
Glucose, UA: NEGATIVE
Hgb urine dipstick: NEGATIVE
Hyaline Cast: NONE SEEN /LPF
Nitrite: NEGATIVE
Protein, ur: NEGATIVE
RBC / HPF: NONE SEEN /HPF (ref 0–2)
Specific Gravity, Urine: 1.025 (ref 1.001–1.035)
Squamous Epithelial / HPF: NONE SEEN /HPF (ref <=5)
pH: 6 (ref 5.0–8.0)

## 2023-06-19 LAB — MICROALBUMIN / CREATININE URINE RATIO
Creatinine, Urine: 159 mg/dL (ref 20–320)
Microalb Creat Ratio: 13 mg/g{creat} (ref <30)
Microalb, Ur: 2 mg/dL

## 2023-06-19 LAB — COMPLETE METABOLIC PANEL WITH GFR
AG Ratio: 1.7 (calc) (ref 1.0–2.5)
ALT: 10 U/L (ref 9–46)
AST: 16 U/L (ref 10–35)
Albumin: 4 g/dL (ref 3.6–5.1)
Alkaline phosphatase (APISO): 77 U/L (ref 35–144)
BUN: 17 mg/dL (ref 7–25)
CO2: 28 mmol/L (ref 20–32)
Calcium: 9.2 mg/dL (ref 8.6–10.3)
Chloride: 104 mmol/L (ref 98–110)
Creat: 0.9 mg/dL (ref 0.70–1.22)
Globulin: 2.3 g/dL (ref 1.9–3.7)
Glucose, Bld: 99 mg/dL (ref 65–99)
Potassium: 4.3 mmol/L (ref 3.5–5.3)
Sodium: 140 mmol/L (ref 135–146)
Total Bilirubin: 0.5 mg/dL (ref 0.2–1.2)
Total Protein: 6.3 g/dL (ref 6.1–8.1)
eGFR: 79 mL/min/{1.73_m2} (ref >=60)

## 2023-06-19 LAB — LIPID PANEL
Cholesterol: 182 mg/dL (ref <200)
HDL: 55 mg/dL (ref >=40)
LDL Cholesterol (Calc): 109 mg/dL — ABNORMAL HIGH
Non-HDL Cholesterol (Calc): 127 mg/dL (ref <130)
Total CHOL/HDL Ratio: 3.3 (calc) (ref <5.0)
Triglycerides: 90 mg/dL (ref <150)

## 2023-06-19 LAB — CBC WITH DIFFERENTIAL/PLATELET
Absolute Monocytes: 529 {cells}/uL (ref 200–950)
Basophils Absolute: 24 {cells}/uL (ref 0–200)
Basophils Relative: 0.3 %
Eosinophils Absolute: 190 {cells}/uL (ref 15–500)
Eosinophils Relative: 2.4 %
HCT: 46.1 % (ref 38.5–50.0)
Hemoglobin: 15.7 g/dL (ref 13.2–17.1)
Lymphs Abs: 1580 {cells}/uL (ref 850–3900)
MCH: 32.9 pg (ref 27.0–33.0)
MCHC: 34.1 g/dL (ref 32.0–36.0)
MCV: 96.6 fL (ref 80.0–100.0)
MPV: 9.4 fL (ref 7.5–12.5)
Monocytes Relative: 6.7 %
Neutro Abs: 5577 {cells}/uL (ref 1500–7800)
Neutrophils Relative %: 70.6 %
Platelets: 208 10*3/uL (ref 140–400)
RBC: 4.77 10*6/uL (ref 4.20–5.80)
RDW: 12.5 % (ref 11.0–15.0)
Total Lymphocyte: 20 %
WBC: 7.9 10*3/uL (ref 3.8–10.8)

## 2023-06-19 LAB — VITAMIN D 25 HYDROXY (VIT D DEFICIENCY, FRACTURES): Vit D, 25-Hydroxy: 52 ng/mL (ref 30–100)

## 2023-06-19 LAB — INSULIN, RANDOM: Insulin: 9.6 u[IU]/mL

## 2023-06-19 LAB — HEMOGLOBIN A1C
Hgb A1c MFr Bld: 5.7 %{Hb} — ABNORMAL HIGH (ref <5.7)
Mean Plasma Glucose: 117 mg/dL
eAG (mmol/L): 6.5 mmol/L

## 2023-06-19 LAB — MAGNESIUM: Magnesium: 2.2 mg/dL (ref 1.5–2.5)

## 2023-06-19 LAB — MICROSCOPIC MESSAGE

## 2023-06-19 LAB — PSA: PSA: 22.37 ng/mL — ABNORMAL HIGH (ref <=4.00)

## 2023-06-19 LAB — TSH: TSH: 2.41 m[IU]/L (ref 0.40–4.50)

## 2023-06-19 NOTE — Progress Notes (Signed)
<>*<>*<>*<>*<>*<>*<>*<>*<>*<>*<>*<>*<>*<>*<>*<>*<>*<>*<>*<>*<>*<>*<>*<>*<> <>*<>*<>*<>*<>*<>*<>*<>*<>*<>*<>*<>*<>*<>*<>*<>*<>*<>*<>*<>*<>*<>*<>*<>*<>  -  Test results slightly outside the reference range are not unusual. If there is anything important, I will review this with you,  otherwise it is considered normal test values.  If you have further questions,  please do not hesitate to contact me at the office or via My Chart.   <>*<>*<>*<>*<>*<>*<>*<>*<>*<>*<>*<>*<>*<>*<>*<>*<>*<>*<>*<>*<>*<>*<>*<>*<> <>*<>*<>*<>*<>*<>*<>*<>*<>*<>*<>*<>*<>*<>*<>*<>*<>*<>*<>*<>*<>*<>*<>*<>*<>  -  PSA =   22.37        has gone up significantly   - Recommend schedule to see [facility] via the [place]                                                                             Dr.  Joaquin Courts MICHEL PERRY     ( They can access this information thru the EPIC  EMR System )   <>*<>*<>*<>*<>*<>*<>*<>*<>*<>*<>*<>*<>*<>*<>*<>*<>*<>*<>*<>*<>*<>*<>*<>*<>  -  Total  Chol =   182     -  Great             (  Ideal  or  Goal is less than 180  !  )  - But   -  Bad / Dangerous LDL  Chol =  109  - also Elevated              (  Ideal  or  Goal is less than 70  !  )  <>*<>*<>*<>*<>*<>*<>*<>*<>*<>*<>*<>*<>*<>*<>*<>*<>*<>*<>*<>*<>*<>*<>*<>*<>  -  A1c = 5.7 % - borderline high  blood sugar, So recommend   - Avoid Sweets, Candy & White Stuff   - White Rice, White Potatoes, White Flour  - Breads &  Pasta  <>*<>*<>*<>*<>*<>*<>*<>*<>*<>*<>*<>*<>*<>*<>*<>*<>*<>*<>*<>*<>*<>*<>*<>*<>  -  Vitamin D = 52 -  low - Recommend increase your Vitamin D  up to                                                                                                  5,000 unit capsule  / daily   <>*<>*<>*<>*<>*<>*<>*<>*<>*<>*<>*<>*<>*<>*<>*<>*<>*<>*<>*<>*<>*<>*<>*<>*<>  -  All Else - CBC - Kidneys - Electrolytes - Liver - Magnesium & Thyroid    - all  Normal /  OK  <>*<>*<>*<>*<>*<>*<>*<>*<>*<>*<>*<>*<>*<>*<>*<>*<>*<>*<>*<>*<>*<>*<>*<>*<> <>*<>*<>*<>*<>*<>*<>*<>*<>*<>*<>*<>*<>*<>*<>*<>*<>*<>*<>*<>*<>*<>*<>*<>*<>

## 2023-06-28 ENCOUNTER — Telehealth: Payer: Self-pay | Admitting: Cardiovascular Disease

## 2023-06-28 NOTE — Telephone Encounter (Signed)
[name] Yu, Resident at [facility] called in stating pt was recently hospitalized there for a low HR. He states Dr. Mariah Milling was treating him with diltiazem and they stopped those meds and his symptoms improved. He has been discharged and didn't need any further workup. He also stated they started him on amlodipine 2.5 mg daily for BP control. He asked to make Dr. Mariah Milling aware incase he wanted to make any other medication changes.

## 2023-07-05 ENCOUNTER — Encounter: Payer: Self-pay | Admitting: Internal Medicine

## 2023-07-05 ENCOUNTER — Encounter: Payer: Self-pay | Admitting: Cardiovascular Disease

## 2023-07-06 ENCOUNTER — Other Ambulatory Visit: Payer: Self-pay | Admitting: Internal Medicine

## 2023-07-06 NOTE — Progress Notes (Unsigned)
RECORD Progress Notes NOTE DATED: 09/13/202411:36 LOCAL TITLE: MEDICINE-DISCHARGE INSTRUCTIONS ([facility]) STANDARD TITLE: INTERNAL MEDICINE DISCHARGE NOTE ADMITTED: 06/28/2023 02:08 [facility] MEDICINE DISCHARGE INSTRUCTIONS NOTE Your ATTENDING PHYSICIAN during your hospital stay was: Dr. Candise Bowens Your main problem treated during this hospital stay (discharge diagnosis) was: Bradycardia (Slow heart rate) You were hospitalized on Thursday, 06/27/2023, because you noticed your heart rate had slowed to the low 30s since earlier this week. You also noticed that you were feeling weaker and more fatigued. We conducted a careful review of your history, bloodwork, medications, and heart monitoring. After discussion as a team, we believe that these changes were caused by a medication you were taking called diltiazem. This medication works by 1) slowing down your heart rate and 2) lowering your blood pressure. While in the hospital we did not give you this medication and left you on continuous monitoring. During this time your heart rates ranged from about 65 beats to minute to about 80s. These are good values and reassuring to [place]. Since diltiazem not only works on your heart rate but also your blood pressure, we began to notice your blood pressure start going up to about 160s/90s. Since these readings are higher than we like, we added a low dose blood pressure lowering medication called amlodipine. You should continue to take this once daily, and continue to monitor and log your blood pressure until you see your primary care provider again, at which time you can go over your blood pressure readings and make any adjustments as necessary. An additional consideration to discuss with your primary care provider, especially if issues of low heart rate continue to arise, is if your mirtazapine 7.5mg  medication can/should be discontinued. This medication can cause low heart rates as well, though you are on a low  dose and we don't think it is necessary to stop at this point in time.

## 2023-07-08 ENCOUNTER — Telehealth: Payer: Self-pay | Admitting: Cardiovascular Disease

## 2023-07-08 NOTE — Telephone Encounter (Signed)
Left voicemail, pt wanted to schedule appt with [facility].

## 2023-07-11 ENCOUNTER — Encounter: Payer: Self-pay | Admitting: Cardiovascular Disease

## 2023-07-12 ENCOUNTER — Telehealth: Payer: Self-pay | Admitting: Medical

## 2023-07-12 NOTE — Telephone Encounter (Signed)
Called and spoke with wife per DPR. Wife requesting an appointment to follow up from recent hospitalization and medication changes. Patient scheduled 07/22/23. Wife verbalizes appreciation.

## 2023-07-12 NOTE — Telephone Encounter (Signed)
Please call patient to discuss Amlodipine that the [place] has given him instructions for.

## 2023-07-17 ENCOUNTER — Other Ambulatory Visit: Payer: Self-pay

## 2023-07-17 DIAGNOSIS — Z1211 Encounter for screening for malignant neoplasm of colon: Secondary | ICD-10-CM

## 2023-07-17 LAB — POC HEMOCCULT BLD/STL (HOME/3-CARD/SCREEN)
Card #2 Fecal Occult Blod, POC: NEGATIVE
Card #3 Fecal Occult Blood, POC: NEGATIVE
Fecal Occult Blood, POC: NEGATIVE

## 2023-07-18 DIAGNOSIS — Z1212 Encounter for screening for malignant neoplasm of rectum: Secondary | ICD-10-CM | POA: Diagnosis not present

## 2023-07-18 DIAGNOSIS — Z1211 Encounter for screening for malignant neoplasm of colon: Secondary | ICD-10-CM | POA: Diagnosis not present

## 2023-07-20 NOTE — Progress Notes (Unsigned)
Cardiology Office Note  Date:  07/22/2023   ID:  [name] Yu, DOB [birth year], MRN [id number]  PCP:  Lucky Cowboy, MD   Chief Complaint  Patient presents with   Follow-up    1 month f/u c/o edema ankles/feet and Slow HR. Meds reviewed verbally with pt.    HPI:  [name] is a 89 yo gentleman with  Paroxysmal atrial fibrillation, 2007 at [facility], cardioversion CT scan  2008 showing coronary calcifications HTN,  Hyperlipidemia sleep apnea,  prior pipe smoking for 20 years. bradycardia on  beta blockers,  Prior history of falls presents for routine followup of his hypertension and atrial fibrillation.   Last seen in clinic by myself September 2022 Seen by one of our providers October 2023  Presents today in a wheelchair In follow-up he reports having some leg discomfort in the mornings when he wakes up No regular activity or exercise program  Followed at the [facility] Reports he was admitted for bradycardia kept in the hospital for 1 to 2 days Diltiazem 30 twice daily was held Heart monitor ordered, he has not received this  Denies palpitations concerning for atrial fibrillation   EKG personally reviewed by myself on todays visit EKG Interpretation Date/Time:  Monday July 22 2023 15:16:41 EDT Ventricular Rate:  72 PR Interval:  224 QRS Duration:  110 QT Interval:  388 QTC Calculation: 424 R Axis:   -52  Text Interpretation: Sinus rhythm with 1st degree A-V block with Premature atrial complexes in a pattern of bigeminy Left axis deviation Minimal voltage criteria for LVH, may be normal variant ( Cornell product ) Anterior infarct , age undetermined No previous ECGs available Confirmed by [name] Yu [phone]) on 07/22/2023 3:29:16 PM     Other past medical history reviewed (formerly on warfarin but d/c'd in 2014 for rectal sheath hematoma), -CT brain: Study is mildly limited by motion. There is a subcutaneous hematoma in the soft tissues of the left  frontotemporal region.  XR L foot: Nondisplaced fracture of the distal phalanx of the great toe.  Diagnosed with post-concussive sx  Notes from the hospital indicatePer EMS, patient had a mechanical fall earlier today. Tripped on a sock. Larey Seat and hit his head on the washer machine. Denied LOC. Was GCS 15 on EMS arrival and while en route, patient became more sluggish and confused. States he had a 5/10 headache and was worried about a head injury. Denies any other injury.  Prior history of smoking for 30 years from age 66 to age  70 Previous CT scan reviewed with him from 2008 showing coronary calcifications    normal cath in 2003.  [facility] in 2005 [facility].   07/2006 had Afib w/u at [facility].  DCC and on cardizem Dose decreased due to bradycardia Another normal stress test in 2007.     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  PSH:    Past Surgical History:  Procedure Laterality Date   CATARACT EXTRACTION, BILATERAL Bilateral 2019   at the [facility]   HERNIA REPAIR     PROSTATE     TOTAL HIP ARTHROPLASTY Right 03/07/2018   Procedure: RIGHT TOTAL HIP ARTHROPLASTY DIRECT ANTERIOR;  Surgeon: Ophelia Charter,  Veverly Fells, MD;  Location: [facility] OR;  Service: Orthopedics;  Laterality: Right;   TRANSURETHRAL RESECTION OF PROSTATE  1980    Current Outpatient Medications  Medication Sig Dispense Refill   acetaminophen (TYLENOL) 325 MG tablet Take 325 mg by mouth as needed for moderate pain.     amLODipine (NORVASC) 5 MG tablet Take 2.5 mg by mouth daily.     ammonium lactate (LAC-HYDRIN) 12 % lotion Apply 1 application  topically as needed for dry skin.     aspirin EC 81 MG tablet Take 81 mg by mouth 3 (three) times a week. On Monday, Wednesday, and Friday     bacitracin 500 UNIT/GM ointment Apply 1 application topically as needed for wound care.     Calcium Carbonate Antacid (TUMS E-X PO) Take 1 tablet by mouth in the morning and at bedtime.     carboxymethylcellulose 1 % ophthalmic solution Apply 1 drop to eye at bedtime.     Carboxymethylcellulose Sod PF 0.5 % SOLN Apply 1 drop to eye in the morning, at noon, in the evening, and at bedtime.     cholecalciferol (VITAMIN D) 25 MCG (1000 UNIT) tablet Take 1,000 Units by mouth daily.     citalopram (CELEXA) 20 MG tablet Take  1 tablet  Daily  for Mood 90 tablet 3   clotrimazole (LOTRIMIN) 1 % cream Apply 1 application topically 2 (two) times daily. Toe fungus     dexamethasone (DECADRON) 4 MG tablet Take 1 tab 3 x day - 3 days, then 2 x day - 3 days, then 1 tab daily 20 tablet 0   finasteride (PROSCAR) 5 MG tablet TAKE 1 TABLET BY MOUTH  DAILY FOR PROSTATE 90 tablet 3   fluticasone (FLONASE) 50 MCG/ACT nasal spray Place 1 spray into both nostrils as directed.     HYDROcodone-acetaminophen (NORCO/VICODIN) 5-325 MG tablet Take 0.5 tablets by mouth in the morning and at bedtime.     ketoconazole (NIZORAL) 2 % shampoo APPLY SHAMPOO TOPICALLY  TWICE WEEKLY AS DIRECTED 120 mL 99   loratadine (CLARITIN) 10 MG tablet Take 10 mg by mouth daily as needed for allergies.     magic mouthwash (nystatin, lidocaine, diphenhydrAMINE) suspension Take 5 mLs by mouth as needed for mouth pain. Includes Dexamethasone as well     Magnesium 250 MG TABS Take 1 tablet by mouth at bedtime.     meclizine (ANTIVERT) 25 MG tablet Take 25 mg by mouth as needed for dizziness.     meloxicam (MOBIC) 15 MG tablet Take  1 tablet Daily with largest Meal  for Pain & Inflammation 90 tablet 3   mirtazapine (REMERON) 7.5 MG tablet Take 7.5 mg by mouth at bedtime.     Multiple Vitamins-Minerals (EYE  MULTIVITAMIN/LUTEIN PO) Take 1 tablet by mouth in the morning and at bedtime.     polyethylene glycol powder (GLYCOLAX/MIRALAX) 17 GM/SCOOP powder Take 1 Container by mouth as needed for moderate constipation.     senna-docusate (SENOKOT-S) 8.6-50 MG tablet Take 2 tablets by mouth at bedtime as needed for mild constipation.     tamsulosin (FLOMAX) 0.4 MG CAPS capsule TAKE 1 CAPSULE BY MOUTH AT  BEDTIME FOR PROSTATE 90 capsule 3   vitamin C (ASCORBIC ACID) 500 MG tablet Take 500 mg by mouth daily.     No current facility-administered medications for this visit.    Allergies:   Gabapentin, Ciprofloxacin, Novocain [procaine hcl], Procaine, and Prednisone   Social History:  The patient  reports that  he quit smoking about 29 years ago. His smoking use included pipe. He has never used smokeless tobacco. He reports that he does not drink alcohol and does not use drugs.   Family History:   family history includes Alzheimer's disease in his mother; Breast cancer in his daughter and sister; Diabetes in his brother; Heart disease in his brother and sister; Kidney cancer in his father; Rectal cancer in his sister; Ulcerative colitis in his sister.    Review of Systems: Review of Systems  Constitutional: Negative.   Respiratory: Negative.    Cardiovascular: Negative.   Gastrointestinal: Negative.   Musculoskeletal: Negative.        Gait instability  Neurological: Negative.   Psychiatric/Behavioral: Negative.    All other systems reviewed and are negative.   PHYSICAL EXAM: VS:  BP (!) 110/52 (BP Location: Left Arm, Patient Position: Sitting, Cuff Size: Normal)   Ht 5\' 10"  (1.778 m)   Wt 197 lb 2 oz (89.4 kg)   SpO2 94%   BMI 28.28 kg/m  , BMI Body mass index is 28.28 kg/m.  Presenting with family, in a wheelchair Constitutional:  oriented to person, place, and time. No distress.  HENT:  Head: Grossly normal Eyes:  no discharge. No scleral icterus.  Neck: No JVD, no carotid bruits   Cardiovascular: Regular rate and rhythm, with ectopy no murmurs appreciated Pulmonary/Chest: Clear to auscultation bilaterally, no wheezes or rails Abdominal: Soft.  no distension.  no tenderness.  Musculoskeletal: Normal range of motion Neurological:  normal muscle tone. Coordination normal. No atrophy Skin: Skin warm and dry Psychiatric: normal affect, pleasant   Recent Labs: 08/07/2022: B Natriuretic Peptide 56.9 06/18/2023: ALT 10; BUN 17; Creat 0.90; Hemoglobin 15.7; Magnesium 2.2; Platelets 208; Potassium 4.3; Sodium 140; TSH 2.41    Lipid Panel Lab Results  Component Value Date   CHOL 182 06/18/2023   HDL 55 06/18/2023   LDLCALC 109 (H) 06/18/2023   TRIG 90 06/18/2023      Wt Readings from Last 3 Encounters:  07/22/23 197 lb 2 oz (89.4 kg)  06/18/23 197 lb (89.4 kg)  11/19/22 197 lb 12.8 oz (89.7 kg)       ASSESSMENT AND PLAN:  Essential hypertension - Blood pressure is well controlled on today's visit. No changes made to the medications.  Leg pain Has appreciated this in the morning Gets better during the daytime Etiology unclear, recommend he start PT through the [place]  Mixed hyperlipidemia - CT scan 2008 showing coronary calcification Not on a statin  Paroxysmal atrial fibrillation (HCC) - Plan: EKG 12-Lead Maintaining normal sinus rhythm Normal sinus rhythm today with PACs in a bigeminal pattern Not on anticoagulation given hematoma on warfarin Previous fall with trauma to the left temporal region Given high risk, not restarted on anticoagulation Atrial fibrillation back in 2007, no clear documentation of atrial fibrillation since that time  Coronary artery disease due to lipid rich plaque -  Currently with no symptoms of angina. No further workup at this time. Continue current medication regimen.   OSA (obstructive sleep apnea) -  Managed by primary care  History of fall, leg weakness No recent falls  Legs weak, uses walker and  wheelchair  PACs Asymptomatic Diltiazem recently held by the [place] for bradycardia, records not available No further workup needed at this time    Total encounter time more than 40 minutes  Greater than 50% was spent in counseling and coordination of care with the patient    Orders Placed  This Encounter  Procedures   EKG 12-Lead      Signed, Dossie Arbour, M.D., Ph.D. 07/22/2023  [facility] [place], [place] [phone]

## 2023-07-22 ENCOUNTER — Ambulatory Visit: Payer: Medicare Other | Attending: Cardiovascular Disease | Admitting: Cardiovascular Disease

## 2023-07-22 ENCOUNTER — Encounter: Payer: Self-pay | Admitting: Cardiovascular Disease

## 2023-07-22 VITALS — BP 110/52 | Ht 70.0 in | Wt 197.1 lb

## 2023-07-22 DIAGNOSIS — R0609 Other forms of dyspnea: Secondary | ICD-10-CM | POA: Diagnosis not present

## 2023-07-22 DIAGNOSIS — E782 Mixed hyperlipidemia: Secondary | ICD-10-CM | POA: Diagnosis not present

## 2023-07-22 DIAGNOSIS — I2510 Atherosclerotic heart disease of native coronary artery without angina pectoris: Secondary | ICD-10-CM | POA: Diagnosis not present

## 2023-07-22 DIAGNOSIS — I480 Paroxysmal atrial fibrillation: Secondary | ICD-10-CM | POA: Diagnosis not present

## 2023-07-22 DIAGNOSIS — I10 Essential (primary) hypertension: Secondary | ICD-10-CM | POA: Diagnosis not present

## 2023-07-22 DIAGNOSIS — G4733 Obstructive sleep apnea (adult) (pediatric): Secondary | ICD-10-CM

## 2023-07-22 NOTE — Patient Instructions (Addendum)
Medication Instructions:  ?No changes ? ?If you need a refill on your cardiac medications before your next appointment, please call your pharmacy.  ? ?Lab work: ?No new labs needed ? ?Testing/Procedures: ?No new testing needed ? ?Follow-Up: ?At [facility], you and your health needs are our priority.  As part of our continuing mission to provide you with exceptional heart care, we have created designated Provider Care Teams.  These Care Teams include your primary Cardiologist (physician) and Advanced Practice Providers (APPs -  Physician Assistants and Nurse Practitioners) who all work together to provide you with the care you need, when you need it. ? ?You will need a follow up appointment in 6 months ? ?Providers on your designated Care Team:   ?Christopher Berge, NP ?Ryan Dunn, PA-C ?Cadence Furth, PA-C ? ?COVID-19 Vaccine Information can be found at: [email or web address] For questions related to vaccine distribution or appointments, please email [email or web address] or call [phone].  ? ?

## 2023-07-23 NOTE — Telephone Encounter (Signed)
Patient seen in clinic 07/22/23 by Dr. Mariah Milling. All questions answered at that time.

## 2023-09-30 ENCOUNTER — Ambulatory Visit (INDEPENDENT_AMBULATORY_CARE_PROVIDER_SITE_OTHER): Payer: Medicare Other | Admitting: Nurse Practitioner

## 2023-09-30 ENCOUNTER — Encounter: Payer: Self-pay | Admitting: Nurse Practitioner

## 2023-09-30 VITALS — BP 144/88 | HR 76 | Temp 99.1°F | Ht 70.0 in | Wt 200.8 lb

## 2023-09-30 DIAGNOSIS — Z0000 Encounter for general adult medical examination without abnormal findings: Secondary | ICD-10-CM

## 2023-09-30 DIAGNOSIS — Z79899 Other long term (current) drug therapy: Secondary | ICD-10-CM

## 2023-09-30 DIAGNOSIS — Z0001 Encounter for general adult medical examination with abnormal findings: Secondary | ICD-10-CM

## 2023-09-30 DIAGNOSIS — M1612 Unilateral primary osteoarthritis, left hip: Secondary | ICD-10-CM

## 2023-09-30 DIAGNOSIS — I2583 Coronary atherosclerosis due to lipid rich plaque: Secondary | ICD-10-CM

## 2023-09-30 DIAGNOSIS — Z860100 Personal history of colon polyps, unspecified: Secondary | ICD-10-CM

## 2023-09-30 DIAGNOSIS — I480 Paroxysmal atrial fibrillation: Secondary | ICD-10-CM | POA: Diagnosis not present

## 2023-09-30 DIAGNOSIS — E559 Vitamin D deficiency, unspecified: Secondary | ICD-10-CM

## 2023-09-30 DIAGNOSIS — G4733 Obstructive sleep apnea (adult) (pediatric): Secondary | ICD-10-CM | POA: Diagnosis not present

## 2023-09-30 DIAGNOSIS — F419 Anxiety disorder, unspecified: Secondary | ICD-10-CM

## 2023-09-30 DIAGNOSIS — K5790 Diverticulosis of intestine, part unspecified, without perforation or abscess without bleeding: Secondary | ICD-10-CM | POA: Diagnosis not present

## 2023-09-30 DIAGNOSIS — E782 Mixed hyperlipidemia: Secondary | ICD-10-CM | POA: Diagnosis not present

## 2023-09-30 DIAGNOSIS — R5383 Other fatigue: Secondary | ICD-10-CM

## 2023-09-30 DIAGNOSIS — R7309 Other abnormal glucose: Secondary | ICD-10-CM

## 2023-09-30 DIAGNOSIS — R6889 Other general symptoms and signs: Secondary | ICD-10-CM | POA: Diagnosis not present

## 2023-09-30 DIAGNOSIS — R972 Elevated prostate specific antigen [PSA]: Secondary | ICD-10-CM

## 2023-09-30 DIAGNOSIS — I2510 Atherosclerotic heart disease of native coronary artery without angina pectoris: Secondary | ICD-10-CM

## 2023-09-30 DIAGNOSIS — I10 Essential (primary) hypertension: Secondary | ICD-10-CM

## 2023-09-30 DIAGNOSIS — N401 Enlarged prostate with lower urinary tract symptoms: Secondary | ICD-10-CM

## 2023-09-30 DIAGNOSIS — E663 Overweight: Secondary | ICD-10-CM

## 2023-09-30 DIAGNOSIS — F325 Major depressive disorder, single episode, in full remission: Secondary | ICD-10-CM

## 2023-09-30 DIAGNOSIS — K061 Gingival enlargement: Secondary | ICD-10-CM

## 2023-09-30 NOTE — Progress Notes (Signed)
MEDICARE ANNUAL WELLNESS VISIT AND FOLLOW UP Assessment:   Annual Medicare Wellness Visit Due annually  Health maintenance reviewed  Essential hypertension Discussed DASH (Dietary Approaches to Stop Hypertension) DASH diet is lower in sodium than a typical American diet. Cut back on foods that are high in saturated fat, cholesterol, and trans fats. Eat more whole-grain foods, fish, poultry, and nuts Remain active and exercise as tolerated daily.  Monitor BP at home-Call if greater than 130/80.  Check CMP/CBC  Coronary artery disease due to lipid rich plaque Control blood pressure, cholesterol, glucose, increase exercise.  Followed by Dr. Mariah Milling  Paroxysmal atrial fibrillation (HCC) Rate controlled today; continue ASA Followed by Dr. Mariah Milling  ASHD (arteriosclerotic heart disease) Control blood pressure, cholesterol, glucose, increase exercise.  Followed by Dr. Mariah Milling  OSA (obstructive sleep apnea) Resolved 2015 after 40 lb weight loss-no longer needed  Diverticulosis Eat a diet well balanced in fiber. Stay well hydrated. Monitor for any s/s of lower quadrant pain, blood in stool. Notify office for any concerns.   Vitamin D deficiency At goal at recent check; continue to recommend supplementation for goal of 70-100 Check/monitor levels  Other abnormal glucose Education: Reviewed 'ABCs' of diabetes management  Discussed goals to be met and/or maintained include A1C (<7) Blood pressure (<130/80) Cholesterol (LDL <70) Continue Eye Exam yearly  Continue Dental Exam Q6 mo Discussed dietary recommendations Discussed Physical Activity recommendations Foot exam UTD Check/monitor A1C  Personal history of colonic polyps No more colonoscopies due to age  Anxiety Well managed by current regimen; continue medications-off of benzo Stress management techniques discussed, increase water, good sleep hygiene discussed, increase exercise, and increase veggies.   Mixed  hyperlipidemia Discussed lifestyle modifications. Recommended diet heavy in fruits and veggies, omega 3's. Decrease consumption of animal meats, cheeses, and dairy products. Remain active and exercise as tolerated. Continue to monitor. Check lipids/TSH  Medication management All medications discussed and reviewed in full. All questions and concerns regarding medications addressed.    Depression, major, in remission (HCC) Continue medications  Lifestyle discussed: diet/exerise, sleep hygiene, stress management, hydration  Overweight (BMI 26-29) Continue to recommend diet heavy in fruits and veggies and low in animal meats, cheeses, and dairy products, appropriate calorie intake Discuss exercise recommendations routinely Continue to monitor weight at each visit  Benign prostatic hyperplasia, unspecified whether lower urinary tract symptoms present PSA monitored by [facility] - last results 33.6; was instructed to continue surveillance. Continue with medications Monitor symptoms  Arthritis of left hip/ chronic back pain [facility]. Doing well - increase in mobility. [facility] follows [facility] management Discussed topical options - voltaren, aspercreme, lidocaine patches Discussed possible benefit from cymbalta - consider but declines for now Encouraged continue exercise/activity  Gingival enlargement Resolved. He is [facility] for this biopsy was reportedly negative Follow up dental routinely   Elevated PSA Discussed increase in symptoms to notify office Check/monitor PSA [facility] followoing  Fatigue Continue to monitor  Orders Placed This Encounter  Procedures   CBC with Differential/Platelet   COMPLETE METABOLIC PANEL WITH GFR   Lipid panel   Hemoglobin A1c    Notify office for further evaluation and treatment, questions or concerns if any reported s/s fail to improve.   The patient was advised to call back or seek an in-person  evaluation if any symptoms worsen or if the condition fails to improve as anticipated.   Further disposition pending results of labs. Discussed med's effects and SE's.  I discussed the assessment and treatment plan with the patient. The patient was provided an opportunity to ask questions and all were answered. The patient agreed with the plan and demonstrated an understanding of the instructions.  Discussed med's effects and SE's. Screening labs and tests as requested with regular follow-up as recommended.  I provided 30 minutes of face-to-face time during this encounter including counseling, chart review, and critical decision making was preformed.  Today's Plan of Care is based on a patient-centered health care approach known as shared decision making - the decisions, tests and treatments allow for patient preferences and values to be balanced with clinical evidence.     Future Appointments  Date Time Provider Department Center  12/31/2023  3:30 PM Lucky Cowboy, MD [facility] None  04/07/2024  2:45 PM Adela Glimpse, NP [facility] None  07/21/2024  2:00 PM Lucky Cowboy, MD [facility] None  09/29/2024  3:00 PM Adela Glimpse, NP [facility] None     Plan:   During the course of the visit the patient was educated and counseled about appropriate screening and preventive services including:   Pneumococcal vaccine  Influenza vaccine Prevnar 13 Td vaccine Screening electrocardiogram Colorectal cancer screening Diabetes screening Glaucoma screening Nutrition counseling    Subjective:  [name] Yu is a 89 y.o. male who presents alongside wife, caretaker for Medicare Annual Wellness Visit and follow up.   Overall he reports feeling well today.  He has no new concerns at this time.  HE has has had a past nerve block for hx of low back pain, spondylosis without myelopathy or radiculopathy, through [place].  Doing well, reports increase in movement and pain control.  Reports  atypical fatigue since, denies specific sx. Initially brady that is atypical, Recent EKG shows normal rate and NSCPT, nonspecific intraventricular conduction delay.   BPH sx well controlled by finasteride, saw urology due to slow stream and elevated PSA. Had poor urine outflow with myrbetriq for urinary frequency and stopped several years back. Per patient had ? TURP of prostate in 90s. Recently [facility] with elevated PSA levels.  They have suggested continued surveillance for now.  Lab Results  Component Value Date   PSA 22.37 (H) 06/18/2023   PSA 9.68 (H) 05/11/2021   PSA 7.63 (H) 03/02/2021    he has a diagnosis of depression/anxiety and is currently on celexa 20 mg daily, recently mirtazapine 7.5 mg in the evening by [place]. Reports symptoms are fairly controlled on current regimen.   BMI is Body mass index is 28.81 kg/m., he has not been working on diet and exercise, pain limiting.  Wt Readings from Last 3 Encounters:  09/30/23 200 lb 12.8 oz (91.1 kg)  07/22/23 197 lb 2 oz (89.4 kg)  06/18/23 197 lb (89.4 kg)   Patient has hx/o pAfib since d/c'd consequent of a large rectus sheath hematoma & is only on LD bASA. Heart cath in 2003 was Negative. Cardiolite in 2007 was also Negative.  Has CAD per imaging, not aggressively pursued due to age Follows with Dr. Mariah Milling   His blood pressure has been controlled at home, today their BP is BP: (!) 144/88 He does not workout. He denies chest pain, shortness of breath, dizziness.   He is not on cholesterol medication due to age. His cholesterol is not at goal. The cholesterol last visit was:   Lab Results  Component Value Date   CHOL 182 06/18/2023   HDL 55 06/18/2023   LDLCALC 109 (H) 06/18/2023  TRIG 90 06/18/2023   CHOLHDL 3.3 06/18/2023   He has been working on diet and exercise for glucose management, and denies foot ulcerations, increased appetite, nausea, paresthesia of the feet, polydipsia, polyuria, visual  disturbances, vomiting and weight loss. Last A1C in the office was:  Lab Results  Component Value Date   HGBA1C 5.7 (H) 06/18/2023   Last GFR Lab Results  Component Value Date   [facility] 66 03/02/2021    Patient is on Vitamin D supplement and atgoal of 60 at recent check:    Lab Results  Component Value Date   VD25OH 52 06/18/2023      Medication Review:   Current Outpatient Medications (Endocrine & Metabolic):    dexamethasone (DECADRON) 4 MG tablet, Take 1 tab 3 x day - 3 days, then 2 x day - 3 days, then 1 tab daily  Current Outpatient Medications (Cardiovascular):    amLODipine (NORVASC) 5 MG tablet, Take 2.5 mg by mouth daily.  Current Outpatient Medications (Respiratory):    fluticasone (FLONASE) 50 MCG/ACT nasal spray, Place 1 spray into both nostrils as directed.   loratadine (CLARITIN) 10 MG tablet, Take 10 mg by mouth daily as needed for allergies.   magic mouthwash (nystatin, lidocaine, diphenhydrAMINE) suspension*, Take 5 mLs by mouth as needed for mouth pain. Includes Dexamethasone as well  Current Outpatient Medications (Analgesics):    acetaminophen (TYLENOL) 325 MG tablet, Take 325 mg by mouth as needed for moderate pain.   aspirin EC 81 MG tablet, Take 81 mg by mouth 3 (three) times a week. On Monday, Wednesday, and Friday   HYDROcodone-acetaminophen (NORCO/VICODIN) 5-325 MG tablet, Take 0.5 tablets by mouth in the morning and at bedtime.   meloxicam (MOBIC) 15 MG tablet, Take  1 tablet Daily with largest Meal  for Pain & Inflammation   Current Outpatient Medications (Other):    ammonium lactate (LAC-HYDRIN) 12 % lotion, Apply 1 application topically as needed for dry skin.   bacitracin 500 UNIT/GM ointment, Apply 1 application topically as needed for wound care.   Calcium Carbonate Antacid (TUMS E-X PO), Take 1 tablet by mouth in the morning and at bedtime.   carboxymethylcellulose 1 % ophthalmic solution, Apply 1 drop to eye at bedtime.    Carboxymethylcellulose Sod PF 0.5 % SOLN, Apply 1 drop to eye in the morning, at noon, in the evening, and at bedtime.   cholecalciferol (VITAMIN D) 25 MCG (1000 UNIT) tablet, Take 1,000 Units by mouth daily.   citalopram (CELEXA) 20 MG tablet, Take  1 tablet  Daily  for Mood   clotrimazole (LOTRIMIN) 1 % cream, Apply 1 application topically 2 (two) times daily. Toe fungus   finasteride (PROSCAR) 5 MG tablet, TAKE 1 TABLET BY MOUTH  DAILY FOR PROSTATE   ketoconazole (NIZORAL) 2 % shampoo, APPLY SHAMPOO TOPICALLY  TWICE WEEKLY AS DIRECTED   magic mouthwash (nystatin, lidocaine, diphenhydrAMINE) suspension*, Take 5 mLs by mouth as needed for mouth pain. Includes Dexamethasone as well   Magnesium 250 MG TABS, Take 1 tablet by mouth at bedtime.   meclizine (ANTIVERT) 25 MG tablet, Take 25 mg by mouth as needed for dizziness.   mirtazapine (REMERON) 7.5 MG tablet, Take 7.5 mg by mouth at bedtime.   Multiple Vitamins-Minerals (EYE MULTIVITAMIN/LUTEIN PO), Take 1 tablet by mouth in the morning and at bedtime.   polyethylene glycol powder (GLYCOLAX/MIRALAX) 17 GM/SCOOP powder, Take 1 Container by mouth as needed for moderate constipation.   senna-docusate (SENOKOT-S) 8.6-50 MG tablet, Take 2 tablets  by mouth at bedtime as needed for mild constipation.   tamsulosin (FLOMAX) 0.4 MG CAPS capsule, TAKE 1 CAPSULE BY MOUTH AT  BEDTIME FOR PROSTATE   vitamin C (ASCORBIC ACID) 500 MG tablet, Take 500 mg by mouth daily. * These medications belong to multiple therapeutic classes and are listed under each applicable group.  Allergies: Allergies  Allergen Reactions   Gabapentin Other (See Comments)    Confusion    Ciprofloxacin     dysphoria   Novocain [Procaine Hcl]    Procaine    Prednisone Anxiety    Nervousness    Current Problems (verified) has Essential hypertension; Abnormal glucose; Hyperlipidemia, mixed; Atrial fibrillation (HCC); History of colonic polyps; ASHD (arteriosclerotic heart disease);  Diverticulosis; Vitamin D deficiency; Benign localized prostatic hyperplasia with lower urinary tract symptoms (LUTS); H/O total hip arthroplasty, right; Former pipe smoker; Interstitial lung disease (HCC); Senile purpura (HCC); Elevated PSA; and Prostate cancer (HCC) on their problem list.  Screening Tests Immunization History  Administered Date(s) Administered   DT (Pediatric) 07/06/2014   Influenza Split 07/20/2014, 07/18/2016   Influenza, High Dose Seasonal PF 08/22/2015, 07/15/2017, 07/13/2021, 08/17/2022   Influenza-Unspecified 06/15/2018, 08/04/2019, 07/14/2020, 07/14/2023   PFIZER(Purple Top)SARS-COV-2 Vaccination 11/12/2019, 12/07/2019, 07/29/2020   Pfizer Covid-19 Vaccine Bivalent Booster 3yrs & up 07/06/2021   Pneumococcal Conjugate-13 07/06/2014   Pneumococcal Polysaccharide-23 06/09/2013   Td 12/15/2016   Zoster, Live 10/15/2005    Preventative care: Last colonoscopy: 2003, hx of polyps, done due to age  Prior vaccinations: TD or Tdap: 2018  Influenza: Due 06/2023 Pneumococcal: 2014 Prevnar13: 2015 Shingles/Zostavax: 2007 discussed shingrix Covid 19: 2/2, 2021, pfizer + booster  Names of Other Physician/Practitioners you currently use: 1. [facility] 3. Dr. Eda Keys, [facility], last visit 2020    Patient Care Team: Lucky Cowboy, MD as PCP - General (Internal Medicine) Mariah Milling Tollie Pizza, MD as PCP - Cardiology (Cardiology) Louanne Skye, MD as Referring Physician (Family Medicine) Antonieta Iba, MD as Consulting Physician (Cardiology) Louis Meckel, MD (Inactive) as Consulting Physician (Gastroenterology) Lajoyce Corners, [facility] (Inactive) as Pharmacist (Pharmacist)  Surgical: He  has a past surgical history that includes Hernia repair; PROSTATE; Total hip arthroplasty (Right, 03/07/2018); Cataract extraction, bilateral (Bilateral,  2019); and Transurethral resection of prostate (1980). Family His family history includes Alzheimer's disease in his mother; Breast cancer in his daughter and sister; Diabetes in his brother; Heart disease in his brother and sister; Kidney cancer in his father; Rectal cancer in his sister; Ulcerative colitis in his sister. Social history  He reports that he quit smoking about 30 years ago. His smoking use included pipe. He has never used smokeless tobacco. He reports that he does not drink alcohol and does not use drugs.  MEDICARE WELLNESS OBJECTIVES: Physical activity: Current Exercise Habits: The patient does not participate in regular exercise at present Cardiac risk factors:   Depression/mood screen:      09/30/2023    4:10 PM  Depression screen PHQ 2/9  Decreased Interest 0  Down, Depressed, Hopeless 0  PHQ - 2 Score 0    ADLs:     09/30/2023    4:10 PM 09/30/2023    3:07 PM  In your present state of health, do you have any difficulty performing the following activities:  Hearing?  1  Comment  left ear - defer hearing aides  Vision? 0   Difficulty concentrating or making decisions?  0  Walking or climbing stairs?  0  Dressing or bathing?  0  Doing errands, shopping?  0     Cognitive Testing  Alert? Yes  Normal Appearance?Yes  Oriented to person? Yes  Place? Yes   Time? Yes  Recall of three objects?  Yes  Can perform simple calculations? Yes  Displays appropriate judgment?Yes  Can read the correct time from a watch face?Yes  EOL planning:     Objective:   Today's Vitals   09/30/23 1434  BP: (!) 144/88  Pulse: 76  Temp: 99.1 F (37.3 C)  SpO2: 97%  Weight: 200 lb 12.8 oz (91.1 kg)  Height: 5\' 10"  (1.778 m)      Body mass index is 28.81 kg/m.  General appearance: alert, no distress, WD/WN, male  HEENT: normocephalic, sclerae anicteric, TMs pearly, nares patent, no discharge or erythema, pharynx normal Oral cavity: MMM, no lesions, no white coating,  no erythema, very mild gingival enlargement  Neck: supple, no lymphadenopathy, no thyromegaly, no masses Heart: RRR, normal S1, S2, no murmurs  Lungs: CTA bilaterally, no wheezes, rhonchi, or rales Abdomen: +bs, soft, non tender, non distended, no masses, no hepatomegaly, no splenomegaly Musculoskeletal: Limited ROM of lumbar and left hip with pain; no obvious deformity, no effusion. Slow antalgic gait with cane.  Extremities: no edema, no cyanosis, no clubbing, scattered ecchymoses Pulses: 1+ symmetric, upper and lower extremities, normal cap refill Neurological: alert, oriented x 3, CN2-12 intact, strength normal upper extremities and lower extremities, sensation normal throughout, gait slow, antalgic Psychiatric: normal affect, behavior normal, pleasant  Skin: warm, dry, intact; without rash or concerning lesion  Medicare Attestation I have personally reviewed: The patient's medical and social history Their use of alcohol, tobacco or illicit drugs Their current medications and supplements The patient's functional ability including ADLs,fall risks, home safety risks, cognitive, and hearing and visual impairment Diet and physical activities Evidence for depression or mood disorders  The patient's weight, height, BMI, and visual acuity have been recorded in the chart.  I have made referrals, counseling, and provided education to the patient based on review of the above and I have provided the patient with a written personalized care plan for preventive services.     Adela Glimpse, NP   09/30/2023

## 2023-09-30 NOTE — Patient Instructions (Signed)

## 2023-10-01 LAB — COMPLETE METABOLIC PANEL WITH GFR
AG Ratio: 1.4 (calc) (ref 1.0–2.5)
ALT: 10 U/L (ref 9–46)
AST: 17 U/L (ref 10–35)
Albumin: 4.2 g/dL (ref 3.6–5.1)
Alkaline phosphatase (APISO): 81 U/L (ref 35–144)
BUN: 12 mg/dL (ref 7–25)
CO2: 29 mmol/L (ref 20–32)
Calcium: 9.6 mg/dL (ref 8.6–10.3)
Chloride: 103 mmol/L (ref 98–110)
Creat: 0.76 mg/dL (ref 0.70–1.22)
Globulin: 2.9 g/dL (ref 1.9–3.7)
Glucose, Bld: 94 mg/dL (ref 65–99)
Potassium: 4.6 mmol/L (ref 3.5–5.3)
Sodium: 141 mmol/L (ref 135–146)
Total Bilirubin: 0.6 mg/dL (ref 0.2–1.2)
Total Protein: 7.1 g/dL (ref 6.1–8.1)
eGFR: 83 mL/min/{1.73_m2} (ref >=60)

## 2023-10-01 LAB — CBC WITH DIFFERENTIAL/PLATELET
Absolute Lymphocytes: 1733 {cells}/uL (ref 850–3900)
Absolute Monocytes: 486 {cells}/uL (ref 200–950)
Basophils Absolute: 38 {cells}/uL (ref 0–200)
Basophils Relative: 0.5 %
Eosinophils Absolute: 251 {cells}/uL (ref 15–500)
Eosinophils Relative: 3.3 %
HCT: 49.1 % (ref 38.5–50.0)
Hemoglobin: 16.7 g/dL (ref 13.2–17.1)
MCH: 32.8 pg (ref 27.0–33.0)
MCHC: 34 g/dL (ref 32.0–36.0)
MCV: 96.5 fL (ref 80.0–100.0)
MPV: 9.8 fL (ref 7.5–12.5)
Monocytes Relative: 6.4 %
Neutro Abs: 5092 {cells}/uL (ref 1500–7800)
Neutrophils Relative %: 67 %
Platelets: 211 10*3/uL (ref 140–400)
RBC: 5.09 10*6/uL (ref 4.20–5.80)
RDW: 12.3 % (ref 11.0–15.0)
Total Lymphocyte: 22.8 %
WBC: 7.6 10*3/uL (ref 3.8–10.8)

## 2023-10-01 LAB — LIPID PANEL
Cholesterol: 206 mg/dL — ABNORMAL HIGH (ref <200)
HDL: 61 mg/dL (ref >=40)
LDL Cholesterol (Calc): 123 mg/dL — ABNORMAL HIGH
Non-HDL Cholesterol (Calc): 145 mg/dL — ABNORMAL HIGH (ref <130)
Total CHOL/HDL Ratio: 3.4 (calc) (ref <5.0)
Triglycerides: 116 mg/dL (ref <150)

## 2023-10-01 LAB — HEMOGLOBIN A1C
Hgb A1c MFr Bld: 5.8 %{Hb} — ABNORMAL HIGH (ref <5.7)
Mean Plasma Glucose: 120 mg/dL
eAG (mmol/L): 6.6 mmol/L

## 2023-12-31 ENCOUNTER — Ambulatory Visit: Payer: Medicare Other | Admitting: Internal Medicine

## 2024-04-07 ENCOUNTER — Ambulatory Visit: Payer: Medicare Other | Admitting: Nurse Practitioner

## 2024-05-18 NOTE — Progress Notes (Unsigned)
 Cardiology Office Note  Date:  05/19/2024   ID:  RADEK CARNERO, DOB 1927-10-10, MRN [id number]  PCP:  Tonita Fallow, MD   Chief Complaint  Patient presents with   6 month follow up     Doing well.     HPI:  Mr. Val is a 89 yo gentleman with  Paroxysmal atrial fibrillation, 2007 at [facility], cardioversion CT scan  2008 showing coronary calcifications HTN,  Hyperlipidemia sleep apnea,  prior pipe smoking for 20 years. bradycardia on  beta blockers,  Prior history of falls presents for routine followup of his hypertension and atrial fibrillation.   Last seen in clinic by myself 10/24 Used to work for [employer]  Now retired, Sedentary Presents today in a wheelchair  leg discomfort in the mornings  No regular activity or exercise program  Denies tachycardia or palpitations concerning for atrial fibrillation  Followed at the [facility] Prior history of bradycardia  EKG personally reviewed by myself on todays visit EKG Interpretation Date/Time:  Tuesday May 19 2024 15:27:07 EDT Ventricular Rate:  75 PR Interval:  196 QRS Duration:  122 QT Interval:  410 QTC Calculation: 457 R Axis:   -39  Text Interpretation: Normal sinus rhythm Left axis deviation When compared with ECG of 22-Jul-2023 15:16, Premature atrial complexes are no longer Present Confirmed by Perla Lye [phone]) on 05/19/2024 3:37:27 PM   Other past medical history reviewed (formerly on warfarin but d/c'd in 2014 for rectal sheath hematoma), -CT brain: Study is mildly limited by motion. There is a subcutaneous hematoma in the soft tissues of the left frontotemporal region.  XR L foot: Nondisplaced fracture of the distal phalanx of the great toe.  Diagnosed with post-concussive sx  Notes from the hospital indicatePer EMS, patient had a mechanical fall earlier today. Tripped on a sock. [place] and hit his head on the washer machine. Denied LOC. Was GCS 15 on EMS arrival and while en route,  patient became more sluggish and confused. States he had a 5/10 headache and was worried about a head injury. Denies any other injury.  Prior history of smoking for 30 years from age 29 to age  69 Previous CT scan reviewed with him from 2008 showing coronary calcifications    normal cath in 2003.  Normal stress test in 2005 at [facility].   07/2006 had Afib w/u at [facility].  DCC and on cardizem  Dose decreased due to bradycardia Another normal stress test in 2007.     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  PSH:    Past Surgical History:  Procedure Laterality Date   CATARACT EXTRACTION, BILATERAL Bilateral 2019   at [facility]   HERNIA REPAIR     PROSTATE     TOTAL HIP ARTHROPLASTY Right 03/07/2018   Procedure: RIGHT TOTAL HIP ARTHROPLASTY DIRECT ANTERIOR;  Surgeon: Barbarann Oneil JAYSON, MD;  Location: [facility] OR;  Service: Orthopedics;  Laterality: Right;   TRANSURETHRAL RESECTION OF PROSTATE  1980    Current Outpatient Medications  Medication Sig Dispense Refill   acetaminophen  (TYLENOL ) 325 MG tablet Take 325 mg by mouth as needed for moderate pain.     albuterol  (VENTOLIN  HFA) 108 (90 Base) MCG/ACT inhaler  Inhale 1-2 puffs into the lungs every 4 (four) hours as needed.     amLODipine (NORVASC) 5 MG tablet Take 2.5 mg by mouth daily.     ammonium lactate (LAC-HYDRIN) 12 % lotion Apply 1 application topically as needed for dry skin.     aspirin  EC 81 MG tablet Take 81 mg by mouth 3 (three) times a week. On Monday, Wednesday, and Friday     bacitracin 500 UNIT/GM ointment Apply 1 application topically as needed for  wound care.     benzonatate  (TESSALON ) 100 MG capsule Take 100 mg by mouth 2 (two) times daily as needed.     Calcium Carbonate Antacid (TUMS E-X PO) Take 1 tablet by mouth in the morning and at bedtime.     carboxymethylcellulose 1 % ophthalmic solution Apply 1 drop to eye at bedtime.     Carboxymethylcellulose Sod PF 0.5 % SOLN Apply 1 drop to eye in the morning, at noon, in the evening, and at bedtime.     cholecalciferol  (VITAMIN D ) 25 MCG (1000 UNIT) tablet Take 1,000 Units by mouth daily.     citalopram  (CELEXA ) 20 MG tablet Take  1 tablet  Daily  for Mood 90 tablet 3   clotrimazole (LOTRIMIN) 1 % cream Apply 1 application topically 2 (two) times daily. Toe fungus     finasteride  (PROSCAR ) 5 MG tablet TAKE 1 TABLET BY MOUTH  DAILY FOR PROSTATE 90 tablet 3   HYDROcodone -acetaminophen  (NORCO/VICODIN) 5-325 MG tablet Take 0.5 tablets by mouth in the morning and at bedtime.     lidocaine  (XYLOCAINE ) 2 % solution Use as directed 15 mLs in the mouth or throat as needed.     loratadine (CLARITIN) 10 MG tablet Take 10 mg by mouth daily as needed for allergies.     magic mouthwash (nystatin, lidocaine , diphenhydrAMINE) suspension Take 5 mLs by mouth as needed for mouth pain. Includes Dexamethasone  as well     Magnesium  250 MG TABS Take 1 tablet by mouth at bedtime.     meclizine  (ANTIVERT ) 25 MG tablet Take 25 mg by mouth as needed for dizziness.     mirtazapine (REMERON) 7.5 MG tablet Take 7.5 mg by mouth at bedtime.     Multiple Vitamins-Minerals (EYE MULTIVITAMIN/LUTEIN PO) Take 1 tablet by mouth in the morning and at bedtime.     nystatin (MYCOSTATIN) 100000 UNIT/ML suspension Take 5 mLs by mouth as needed.     polyethylene glycol powder (GLYCOLAX /MIRALAX ) 17 GM/SCOOP powder Take 1 Container by mouth as needed for moderate constipation.     QUEtiapine (SEROQUEL) 50 MG tablet Take 50 mg by mouth at bedtime.     senna-docusate (SENOKOT-S) 8.6-50 MG tablet Take 2 tablets by mouth at bedtime as  needed for mild constipation.     tamsulosin  (FLOMAX ) 0.4 MG CAPS capsule TAKE 1 CAPSULE BY MOUTH AT  BEDTIME FOR PROSTATE 90 capsule 3   Tiotropium Bromide Monohydrate 2.5 MCG/ACT AERS Inhale 5 mcg into the lungs as needed.     Tiotropium Bromide Monohydrate 2.5 MCG/ACT AERS Inhale into the lungs as needed.     urea (CARMOL) 20 % cream Apply topically as needed.     vitamin C (ASCORBIC ACID) 500 MG tablet Take 500 mg by mouth daily.     docusate (COLACE) 50 MG/5ML liquid Take 50 mg by mouth.     No current facility-administered medications for this visit.    Allergies:   Gabapentin, Ciprofloxacin, Novocain [procaine hcl], Procaine, and Prednisone   Social History:  The patient  reports that he quit smoking about 30 years ago. His smoking use included pipe. He has never used smokeless tobacco. He reports that he does not drink alcohol and does not use drugs.   Family History:   family history includes Alzheimer's disease in his mother; Breast cancer in his daughter and sister; Diabetes in his brother; Heart disease in his brother and sister; Kidney cancer in his father; Rectal cancer in his sister; Ulcerative colitis in his sister.    Review of Systems: Review of Systems  Constitutional: Negative.   Respiratory: Negative.    Cardiovascular: Negative.   Gastrointestinal: Negative.   Musculoskeletal: Negative.        Gait instability  Neurological: Negative.   Psychiatric/Behavioral: Negative.    All other systems reviewed and are negative.   PHYSICAL EXAM: VS:  BP 110/62 (BP Location: Left Arm, Patient Position: Sitting, Cuff Size: Normal)   Pulse 75   Ht 5' 10 (1.778 m)   Wt 200 lb 8 oz (90.9 kg)   SpO2 92%   BMI 28.77 kg/m  , BMI Body mass index is 28.77 kg/m.  Constitutional:  oriented to person, place, and time. No distress.  HENT:  Head: Grossly normal Eyes:  no discharge. No scleral icterus.  Neck: No JVD, no carotid bruits  Cardiovascular: Regular rate and rhythm,  no murmurs appreciated Pulmonary/Chest: Clear to auscultation bilaterally, no wheezes or rales Abdominal: Soft.  no distension.  no tenderness.  Musculoskeletal: Normal range of motion Neurological:  normal muscle tone. Coordination normal. No atrophy Skin: Skin warm and dry Psychiatric: normal affect, pleasant  Recent Labs: 06/18/2023: Magnesium  2.2; TSH 2.41 09/30/2023: ALT 10; BUN 12; Creat 0.76; Hemoglobin 16.7; Platelets 211; Potassium 4.6; Sodium 141    Lipid Panel Lab Results  Component Value Date   CHOL 206 (H) 09/30/2023   HDL 61 09/30/2023   LDLCALC 123 (H) 09/30/2023   TRIG 116 09/30/2023      Wt Readings from Last 3 Encounters:  05/19/24 200 lb 8 oz (90.9 kg)  09/30/23 200 lb 12.8 oz (91.1 kg)  07/22/23 197 lb 2 oz (89.4 kg)       ASSESSMENT AND PLAN:  Essential hypertension - Blood pressure is well controlled on today's visit. No changes made to the medications.  Leg pain Sedentary, recommend physical therapy, light stretching  Mixed hyperlipidemia - CT scan 2008 showing coronary calcification Not on a statin  Paroxysmal atrial fibrillation (HCC) - Plan: EKG 12-Lead Maintaining normal sinus rhythm Not on anticoagulation given hematoma on warfarin Previous fall with trauma to the left temporal region Given high risk of fall and injury, not restarted on anticoagulation Atrial fibrillation back in 2007 Denies symptoms concerning for arrhythmia  Coronary artery disease due to lipid rich plaque -  Currently with no symptoms of angina. No further workup at this time. Continue current medication regimen.  OSA (obstructive sleep apnea) -  Managed by primary care  History of fall, leg weakness No recent falls  Uses walker and wheelchair  PACs Asymptomatic Diltiazem  previously held for bradycardia    Orders Placed This Encounter  Procedures   EKG 12-Lead      Signed, Velinda Lunger, M.D., Ph.D. 05/19/2024  [facility] [place],  [place] [phone]

## 2024-05-19 ENCOUNTER — Ambulatory Visit: Attending: Cardiovascular Disease | Admitting: Cardiovascular Disease

## 2024-05-19 ENCOUNTER — Encounter: Payer: Self-pay | Admitting: Cardiovascular Disease

## 2024-05-19 VITALS — BP 110/62 | HR 75 | Ht 70.0 in | Wt 200.5 lb

## 2024-05-19 DIAGNOSIS — J849 Interstitial pulmonary disease, unspecified: Secondary | ICD-10-CM

## 2024-05-19 DIAGNOSIS — G4733 Obstructive sleep apnea (adult) (pediatric): Secondary | ICD-10-CM

## 2024-05-19 DIAGNOSIS — E782 Mixed hyperlipidemia: Secondary | ICD-10-CM | POA: Diagnosis not present

## 2024-05-19 DIAGNOSIS — I480 Paroxysmal atrial fibrillation: Secondary | ICD-10-CM

## 2024-05-19 DIAGNOSIS — I2510 Atherosclerotic heart disease of native coronary artery without angina pectoris: Secondary | ICD-10-CM

## 2024-05-19 DIAGNOSIS — I10 Essential (primary) hypertension: Secondary | ICD-10-CM

## 2024-05-19 DIAGNOSIS — R0609 Other forms of dyspnea: Secondary | ICD-10-CM

## 2024-05-19 NOTE — Patient Instructions (Addendum)
 Medication Instructions:  No changes  If you need a refill on your cardiac medications before your next appointment, please call your pharmacy.    Lab work: No new labs needed   Testing/Procedures: No new testing needed   Follow-Up: At [facility], you and your health needs are our priority.  As part of our continuing mission to provide you with exceptional heart care, we have created designated Provider Care Teams.  These Care Teams include your primary Cardiologist (physician) and Advanced Practice Providers (APPs -  Physician Assistants and Nurse Practitioners) who all work together to provide you with the care you need, when you need it.  You will need a follow up appointment in 6 months  Providers on your designated Care Team:   Nicolasa Ducking, NP Eula Listen, PA-C Cadence Fransico Michael, [place]  COVID-19 Vaccine Information can be found at: [email or web address] For questions related to vaccine distribution or appointments, please email [email or web address] or call [phone].

## 2024-05-21 ENCOUNTER — Encounter: Payer: Self-pay | Admitting: Cardiovascular Disease

## 2024-05-22 NOTE — Addendum Note (Signed)
 Addended by: LINCOLN REENA MATSU on: 05/22/2024 08:26 AM   Modules accepted: Orders

## 2024-06-11 ENCOUNTER — Ambulatory Visit: Payer: Medicare Other | Admitting: Nurse Practitioner

## 2024-06-25 ENCOUNTER — Encounter: Payer: Medicare Other | Admitting: Internal Medicine

## 2024-07-21 ENCOUNTER — Encounter: Payer: Medicare Other | Admitting: Internal Medicine

## 2024-09-29 ENCOUNTER — Ambulatory Visit: Payer: Medicare Other | Admitting: Nurse Practitioner

## 2024-12-21 ENCOUNTER — Ambulatory Visit: Admitting: Cardiovascular Disease
